# Patient Record
Sex: Female | Born: 1970 | Race: White | Hispanic: No | Marital: Married | Smoking: Former smoker
Health system: Southern US, Community
[De-identification: ages and names within clinical notes are randomized; demographics above are authoritative.]

## PROBLEM LIST (undated history)

## (undated) ENCOUNTER — Emergency Department (HOSPITAL_COMMUNITY): Disposition: A | Discharge status: Home / Self Care | Payer: Self-pay

## (undated) DIAGNOSIS — Z8719 Personal history of other diseases of the digestive system: Secondary | ICD-10-CM

## (undated) DIAGNOSIS — K219 Gastro-esophageal reflux disease without esophagitis: Secondary | ICD-10-CM

## (undated) DIAGNOSIS — G459 Transient cerebral ischemic attack, unspecified: Secondary | ICD-10-CM

## (undated) DIAGNOSIS — M1990 Unspecified osteoarthritis, unspecified site: Secondary | ICD-10-CM

## (undated) DIAGNOSIS — E119 Type 2 diabetes mellitus without complications: Secondary | ICD-10-CM

## (undated) DIAGNOSIS — R0600 Dyspnea, unspecified: Secondary | ICD-10-CM

## (undated) DIAGNOSIS — G8929 Other chronic pain: Secondary | ICD-10-CM

## (undated) DIAGNOSIS — S0300XA Dislocation of jaw, unspecified side, initial encounter: Secondary | ICD-10-CM

## (undated) DIAGNOSIS — R519 Headache, unspecified: Secondary | ICD-10-CM

## (undated) DIAGNOSIS — F959 Tic disorder, unspecified: Secondary | ICD-10-CM

## (undated) DIAGNOSIS — E079 Disorder of thyroid, unspecified: Secondary | ICD-10-CM

## (undated) DIAGNOSIS — E118 Type 2 diabetes mellitus with unspecified complications: Secondary | ICD-10-CM

## (undated) DIAGNOSIS — R238 Other skin changes: Secondary | ICD-10-CM

## (undated) DIAGNOSIS — G259 Extrapyramidal and movement disorder, unspecified: Secondary | ICD-10-CM

## (undated) DIAGNOSIS — T7840XA Allergy, unspecified, initial encounter: Secondary | ICD-10-CM

## (undated) DIAGNOSIS — K551 Chronic vascular disorders of intestine: Secondary | ICD-10-CM

## (undated) DIAGNOSIS — F419 Anxiety disorder, unspecified: Secondary | ICD-10-CM

## (undated) DIAGNOSIS — F329 Major depressive disorder, single episode, unspecified: Secondary | ICD-10-CM

## (undated) DIAGNOSIS — R52 Pain, unspecified: Secondary | ICD-10-CM

## (undated) DIAGNOSIS — F32A Depression, unspecified: Secondary | ICD-10-CM

## (undated) DIAGNOSIS — R059 Cough, unspecified: Secondary | ICD-10-CM

## (undated) DIAGNOSIS — R112 Nausea with vomiting, unspecified: Secondary | ICD-10-CM

## (undated) DIAGNOSIS — M7989 Other specified soft tissue disorders: Secondary | ICD-10-CM

## (undated) DIAGNOSIS — R0789 Other chest pain: Secondary | ICD-10-CM

## (undated) DIAGNOSIS — N289 Disorder of kidney and ureter, unspecified: Secondary | ICD-10-CM

## (undated) DIAGNOSIS — M797 Fibromyalgia: Secondary | ICD-10-CM

## (undated) DIAGNOSIS — R233 Spontaneous ecchymoses: Secondary | ICD-10-CM

## (undated) DIAGNOSIS — E7800 Pure hypercholesterolemia, unspecified: Secondary | ICD-10-CM

## (undated) DIAGNOSIS — R51 Headache: Secondary | ICD-10-CM

## (undated) DIAGNOSIS — E039 Hypothyroidism, unspecified: Secondary | ICD-10-CM

## (undated) DIAGNOSIS — I10 Essential (primary) hypertension: Secondary | ICD-10-CM

## (undated) DIAGNOSIS — Z9889 Other specified postprocedural states: Secondary | ICD-10-CM

## (undated) DIAGNOSIS — R05 Cough: Secondary | ICD-10-CM

## (undated) HISTORY — PX: ABDOMINAL HYSTERECTOMY: SHX81

## (undated) HISTORY — DX: Type 2 diabetes mellitus with unspecified complications: E11.8

## (undated) HISTORY — DX: Cough, unspecified: R05.9

## (undated) HISTORY — PX: OOPHORECTOMY: SHX86

## (undated) HISTORY — PX: BREAST CYST EXCISION: SHX579

## (undated) HISTORY — DX: Anxiety disorder, unspecified: F41.9

## (undated) HISTORY — DX: Other skin changes: R23.8

## (undated) HISTORY — DX: Gastro-esophageal reflux disease without esophagitis: K21.9

## (undated) HISTORY — DX: Other specified soft tissue disorders: M79.89

## (undated) HISTORY — DX: Essential (primary) hypertension: I10

## (undated) HISTORY — DX: Chronic vascular disorders of intestine: K55.1

## (undated) HISTORY — DX: Cough: R05

## (undated) HISTORY — PX: AORTA - SUPERIOR MESENTERIC AND AORTA - RENAL ARTERY BYPASS GRAFT: SUR175

## (undated) HISTORY — DX: Transient cerebral ischemic attack, unspecified: G45.9

## (undated) HISTORY — PX: CHOLECYSTECTOMY: SHX55

## (undated) HISTORY — DX: Disorder of thyroid, unspecified: E07.9

## (undated) HISTORY — PX: HERNIA REPAIR: SHX51

## (undated) HISTORY — DX: Spontaneous ecchymoses: R23.3

## (undated) HISTORY — DX: Tic disorder, unspecified: F95.9

## (undated) HISTORY — PX: BREAST EXCISIONAL BIOPSY: SUR124

## (undated) HISTORY — DX: Pure hypercholesterolemia, unspecified: E78.00

## (undated) HISTORY — DX: Disorder of kidney and ureter, unspecified: N28.9

## (undated) HISTORY — DX: Allergy, unspecified, initial encounter: T78.40XA

## (undated) HISTORY — PX: TOTAL ABDOMINAL HYSTERECTOMY: SHX209

## (undated) HISTORY — PX: OTHER SURGICAL HISTORY: SHX169

## (undated) HISTORY — PX: APPENDECTOMY: SHX54

---

## 1997-02-28 ENCOUNTER — Inpatient Hospital Stay (HOSPITAL_COMMUNITY): Admission: AD | Admit: 1997-02-28 | Discharge: 1997-02-28 | Payer: Self-pay | Admitting: Obstetrics and Gynecology

## 1997-06-21 ENCOUNTER — Inpatient Hospital Stay (HOSPITAL_COMMUNITY): Admission: AD | Admit: 1997-06-21 | Discharge: 1997-06-23 | Payer: Self-pay | Admitting: Obstetrics and Gynecology

## 1999-03-23 ENCOUNTER — Ambulatory Visit (HOSPITAL_COMMUNITY): Admission: RE | Admit: 1999-03-23 | Discharge: 1999-03-23 | Payer: Self-pay | Admitting: *Deleted

## 2001-02-05 ENCOUNTER — Other Ambulatory Visit: Admission: RE | Admit: 2001-02-05 | Discharge: 2001-02-05 | Payer: Self-pay | Admitting: Obstetrics and Gynecology

## 2001-11-11 ENCOUNTER — Observation Stay (HOSPITAL_COMMUNITY): Admission: RE | Admit: 2001-11-11 | Discharge: 2001-11-12 | Payer: Self-pay

## 2001-11-11 ENCOUNTER — Encounter (INDEPENDENT_AMBULATORY_CARE_PROVIDER_SITE_OTHER): Payer: Self-pay | Admitting: *Deleted

## 2002-02-05 ENCOUNTER — Other Ambulatory Visit: Admission: RE | Admit: 2002-02-05 | Discharge: 2002-02-05 | Payer: Self-pay | Admitting: Obstetrics and Gynecology

## 2002-02-17 ENCOUNTER — Other Ambulatory Visit: Admission: RE | Admit: 2002-02-17 | Discharge: 2002-02-17 | Payer: Self-pay | Admitting: Obstetrics and Gynecology

## 2002-09-15 ENCOUNTER — Other Ambulatory Visit: Admission: RE | Admit: 2002-09-15 | Discharge: 2002-09-15 | Payer: Self-pay | Admitting: Obstetrics and Gynecology

## 2002-10-22 ENCOUNTER — Ambulatory Visit (HOSPITAL_COMMUNITY): Admission: RE | Admit: 2002-10-22 | Discharge: 2002-10-22 | Payer: Self-pay | Admitting: *Deleted

## 2002-12-27 ENCOUNTER — Inpatient Hospital Stay (HOSPITAL_COMMUNITY): Admission: EM | Admit: 2002-12-27 | Discharge: 2002-12-28 | Payer: Self-pay | Admitting: Internal Medicine

## 2003-01-13 ENCOUNTER — Emergency Department (HOSPITAL_COMMUNITY): Admission: EM | Admit: 2003-01-13 | Discharge: 2003-01-13 | Payer: Self-pay | Admitting: Emergency Medicine

## 2003-09-16 ENCOUNTER — Other Ambulatory Visit: Admission: RE | Admit: 2003-09-16 | Discharge: 2003-09-16 | Payer: Self-pay | Admitting: Obstetrics and Gynecology

## 2003-11-09 ENCOUNTER — Ambulatory Visit (HOSPITAL_COMMUNITY): Admission: RE | Admit: 2003-11-09 | Discharge: 2003-11-09 | Payer: Self-pay | Admitting: Obstetrics and Gynecology

## 2004-03-02 ENCOUNTER — Other Ambulatory Visit: Admission: RE | Admit: 2004-03-02 | Discharge: 2004-03-02 | Payer: Self-pay | Admitting: Obstetrics and Gynecology

## 2004-06-21 ENCOUNTER — Other Ambulatory Visit: Admission: RE | Admit: 2004-06-21 | Discharge: 2004-06-21 | Payer: Self-pay | Admitting: Obstetrics and Gynecology

## 2004-08-17 ENCOUNTER — Ambulatory Visit (HOSPITAL_COMMUNITY): Admission: RE | Admit: 2004-08-17 | Discharge: 2004-08-17 | Payer: Self-pay | Admitting: *Deleted

## 2004-08-17 ENCOUNTER — Encounter (INDEPENDENT_AMBULATORY_CARE_PROVIDER_SITE_OTHER): Payer: Self-pay | Admitting: *Deleted

## 2004-08-29 ENCOUNTER — Encounter: Admission: RE | Admit: 2004-08-29 | Discharge: 2004-08-29 | Payer: Self-pay | Admitting: Obstetrics and Gynecology

## 2004-10-16 ENCOUNTER — Ambulatory Visit: Payer: Self-pay | Admitting: Internal Medicine

## 2004-10-30 ENCOUNTER — Ambulatory Visit (HOSPITAL_COMMUNITY): Admission: RE | Admit: 2004-10-30 | Discharge: 2004-10-30 | Payer: Self-pay | Admitting: Family Medicine

## 2004-10-30 ENCOUNTER — Emergency Department (HOSPITAL_COMMUNITY): Admission: EM | Admit: 2004-10-30 | Discharge: 2004-10-30 | Payer: Self-pay | Admitting: Emergency Medicine

## 2004-12-13 ENCOUNTER — Ambulatory Visit (HOSPITAL_COMMUNITY): Admission: RE | Admit: 2004-12-13 | Discharge: 2004-12-13 | Payer: Self-pay | Admitting: Neurosurgery

## 2005-01-10 ENCOUNTER — Ambulatory Visit (HOSPITAL_COMMUNITY): Admission: RE | Admit: 2005-01-10 | Discharge: 2005-01-10 | Payer: Self-pay | Admitting: Obstetrics and Gynecology

## 2005-03-06 ENCOUNTER — Encounter: Admission: RE | Admit: 2005-03-06 | Discharge: 2005-03-15 | Payer: Self-pay | Admitting: Neurosurgery

## 2005-03-29 ENCOUNTER — Ambulatory Visit (HOSPITAL_COMMUNITY): Admission: RE | Admit: 2005-03-29 | Discharge: 2005-03-29 | Payer: Self-pay | Admitting: Family Medicine

## 2005-05-13 ENCOUNTER — Ambulatory Visit: Payer: Self-pay | Admitting: Internal Medicine

## 2005-06-27 ENCOUNTER — Ambulatory Visit: Payer: Self-pay | Admitting: Internal Medicine

## 2005-08-29 ENCOUNTER — Ambulatory Visit (HOSPITAL_COMMUNITY): Admission: RE | Admit: 2005-08-29 | Discharge: 2005-08-30 | Payer: Self-pay | Admitting: Obstetrics and Gynecology

## 2005-08-29 ENCOUNTER — Encounter (INDEPENDENT_AMBULATORY_CARE_PROVIDER_SITE_OTHER): Payer: Self-pay | Admitting: Specialist

## 2005-12-25 ENCOUNTER — Ambulatory Visit (HOSPITAL_COMMUNITY): Admission: RE | Admit: 2005-12-25 | Discharge: 2005-12-25 | Payer: Self-pay | Admitting: Family Medicine

## 2005-12-27 ENCOUNTER — Ambulatory Visit (HOSPITAL_COMMUNITY): Admission: RE | Admit: 2005-12-27 | Discharge: 2005-12-27 | Payer: Self-pay | Admitting: Family Medicine

## 2006-01-22 ENCOUNTER — Inpatient Hospital Stay (HOSPITAL_COMMUNITY): Admission: EM | Admit: 2006-01-22 | Discharge: 2006-01-23 | Payer: Self-pay | Admitting: Emergency Medicine

## 2006-01-22 ENCOUNTER — Ambulatory Visit: Payer: Self-pay | Admitting: *Deleted

## 2006-02-12 ENCOUNTER — Ambulatory Visit: Payer: Self-pay | Admitting: Internal Medicine

## 2006-02-21 ENCOUNTER — Encounter: Admission: RE | Admit: 2006-02-21 | Discharge: 2006-02-21 | Payer: Self-pay | Admitting: Family Medicine

## 2006-06-17 ENCOUNTER — Encounter: Admission: RE | Admit: 2006-06-17 | Discharge: 2006-06-17 | Payer: Self-pay | Admitting: Obstetrics and Gynecology

## 2006-10-22 ENCOUNTER — Ambulatory Visit (HOSPITAL_COMMUNITY): Admission: RE | Admit: 2006-10-22 | Discharge: 2006-10-22 | Payer: Self-pay | Admitting: Cardiology

## 2006-12-20 ENCOUNTER — Ambulatory Visit (HOSPITAL_COMMUNITY): Admission: RE | Admit: 2006-12-20 | Discharge: 2006-12-20 | Payer: Self-pay | Admitting: Cardiology

## 2007-02-25 ENCOUNTER — Encounter: Admission: RE | Admit: 2007-02-25 | Discharge: 2007-02-25 | Payer: Self-pay | Admitting: Obstetrics and Gynecology

## 2007-06-15 ENCOUNTER — Emergency Department (HOSPITAL_COMMUNITY): Admission: EM | Admit: 2007-06-15 | Discharge: 2007-06-15 | Payer: Self-pay | Admitting: Emergency Medicine

## 2007-08-05 ENCOUNTER — Ambulatory Visit (HOSPITAL_COMMUNITY): Admission: RE | Admit: 2007-08-05 | Discharge: 2007-08-05 | Payer: Self-pay | Admitting: Internal Medicine

## 2007-09-11 ENCOUNTER — Ambulatory Visit (HOSPITAL_COMMUNITY): Admission: RE | Admit: 2007-09-11 | Discharge: 2007-09-11 | Payer: Self-pay | Admitting: *Deleted

## 2007-09-11 ENCOUNTER — Encounter (INDEPENDENT_AMBULATORY_CARE_PROVIDER_SITE_OTHER): Payer: Self-pay | Admitting: *Deleted

## 2007-12-10 IMAGING — US US EXTREM LOW VENOUS BILAT
1 series · 14 of 24 positions shown · non-contrast
Comparison: none

HISTORY: Bilateral leg swelling, left leg pain

[Series 1: unknown · 14 of 51 slices shown]
[im 1/51]
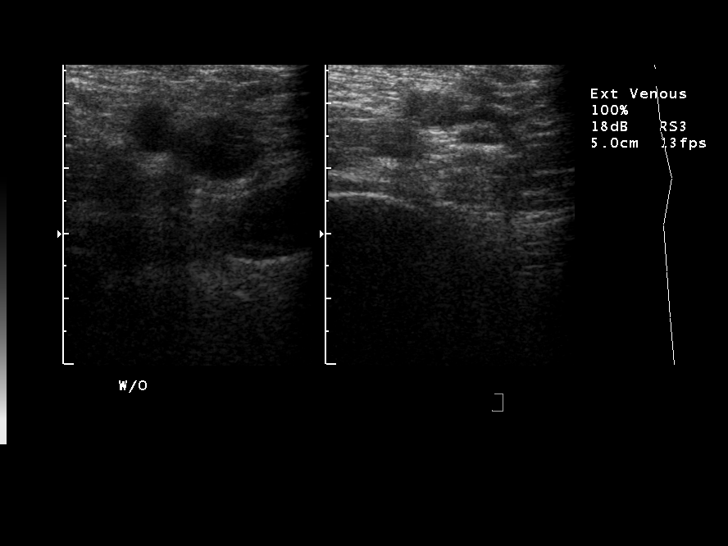
[im 5/51]
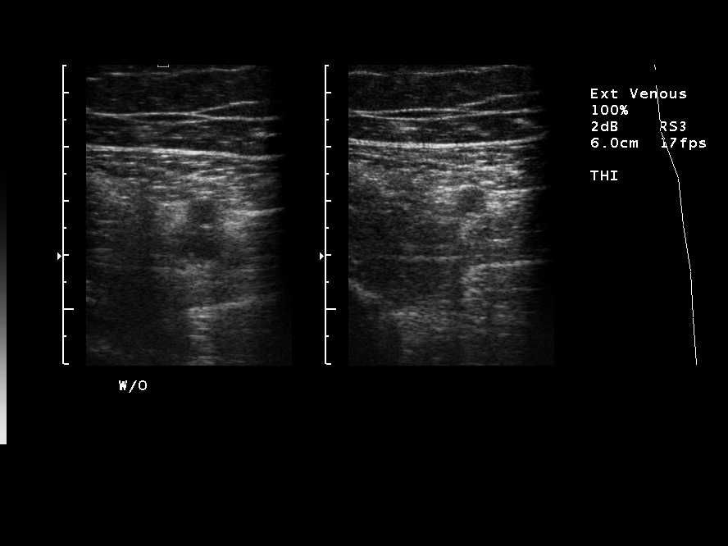
[im 9/51]
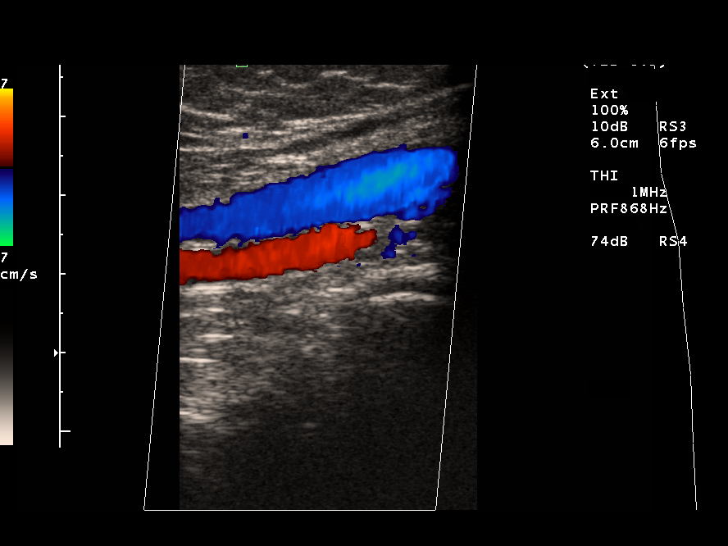
[im 14/51]
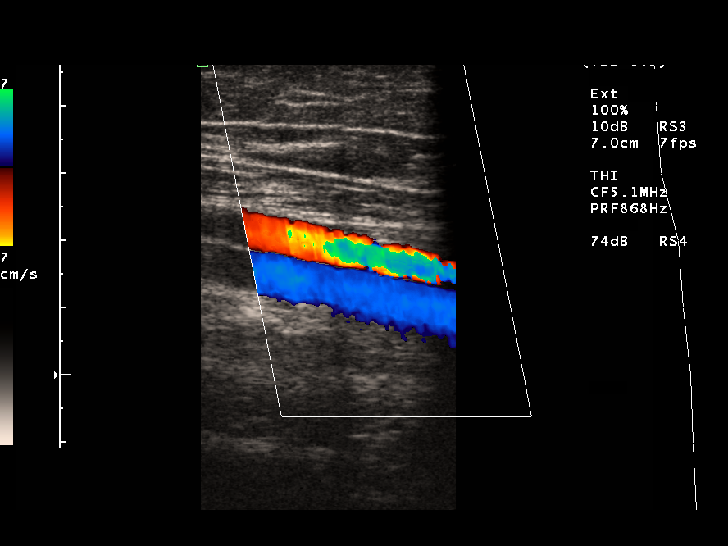
[im 16/51]
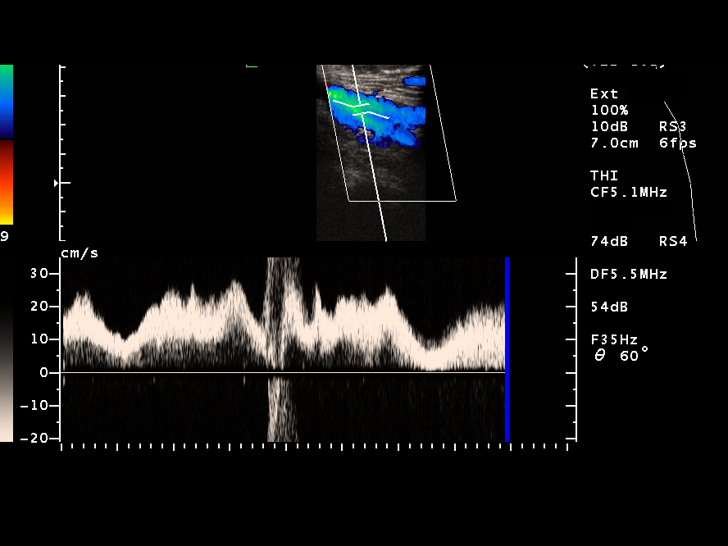
[im 20/51]
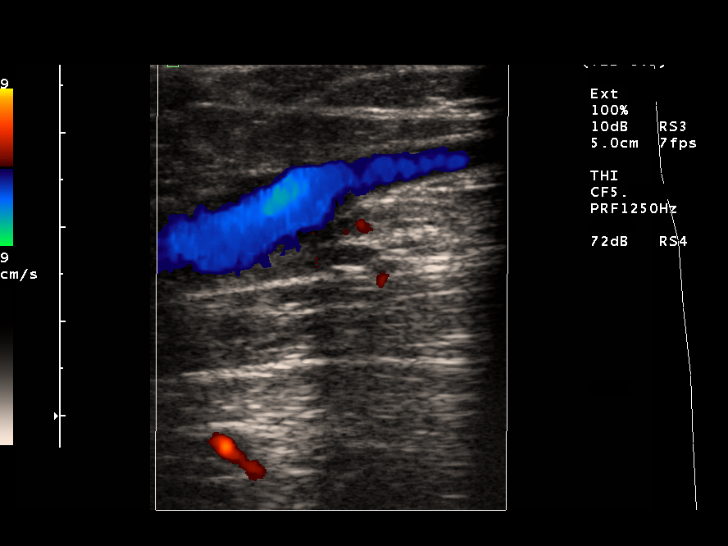
[im 24/51]
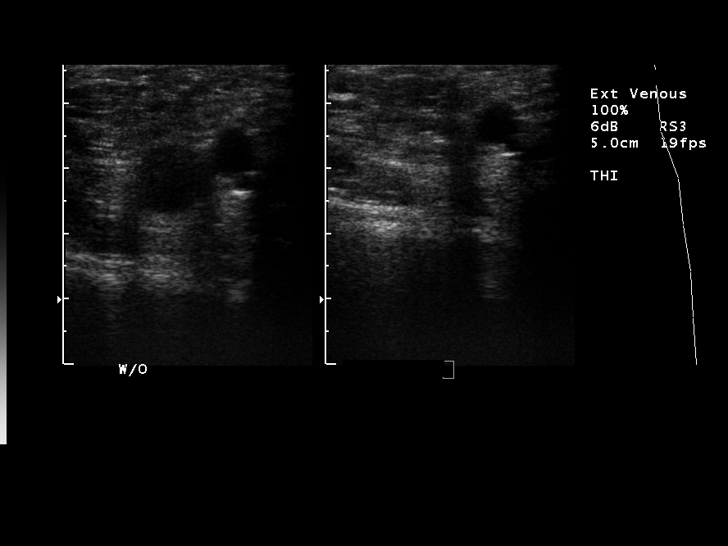
[im 27/51]
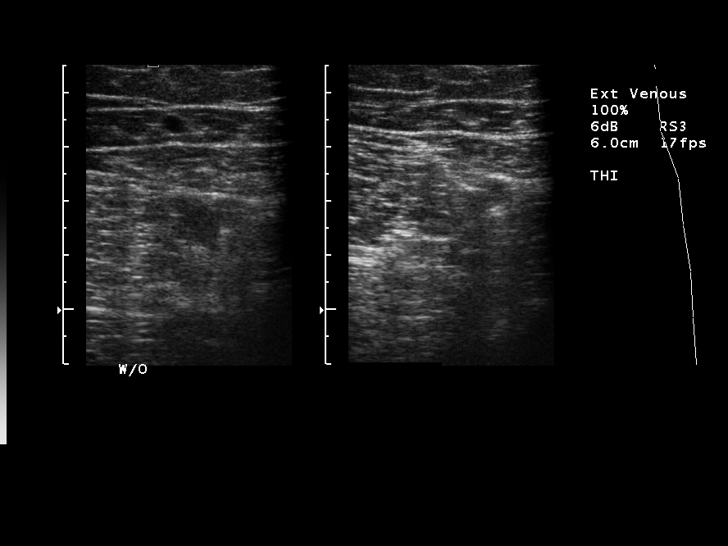
[im 31/51]
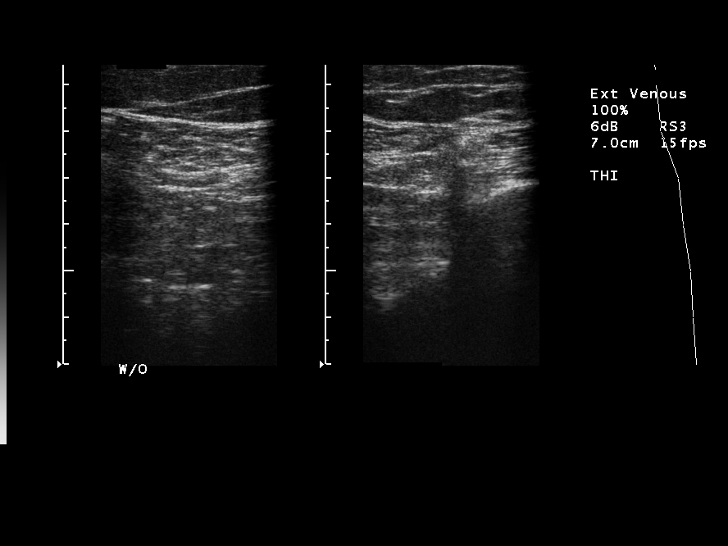
[im 35/51]
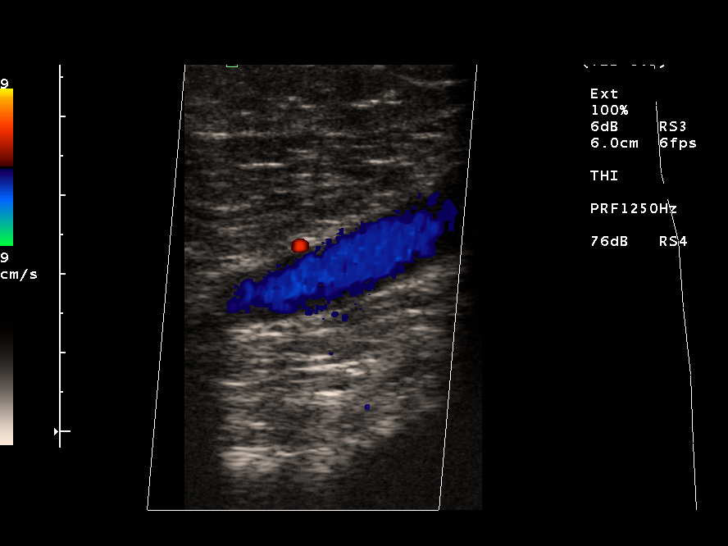
[im 40/51]
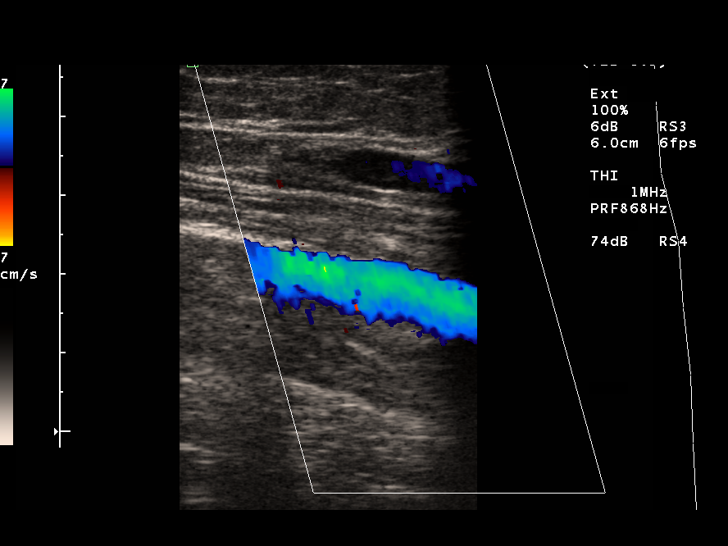
[im 42/51]
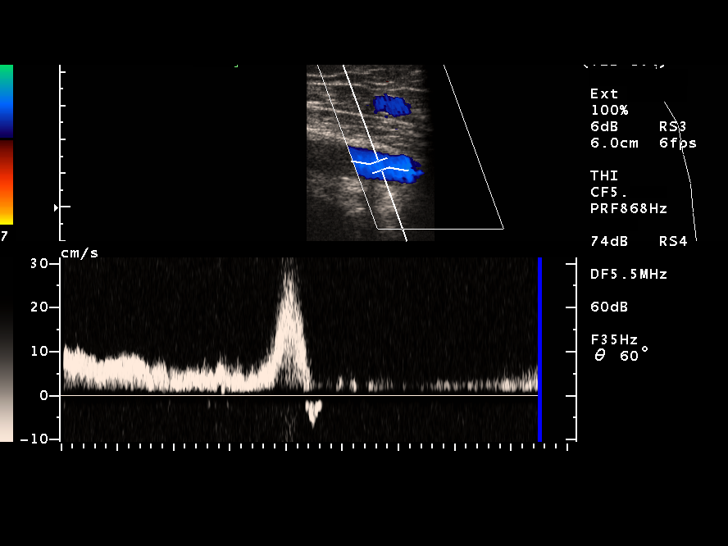
[im 46/51]
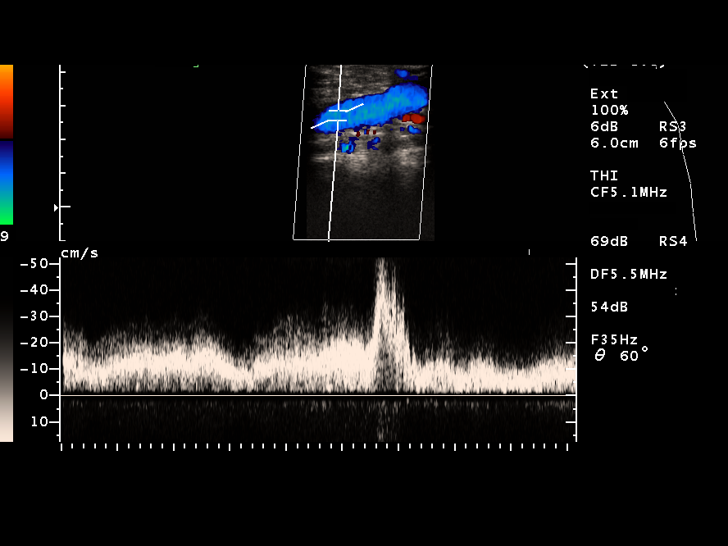
[im 51/51]
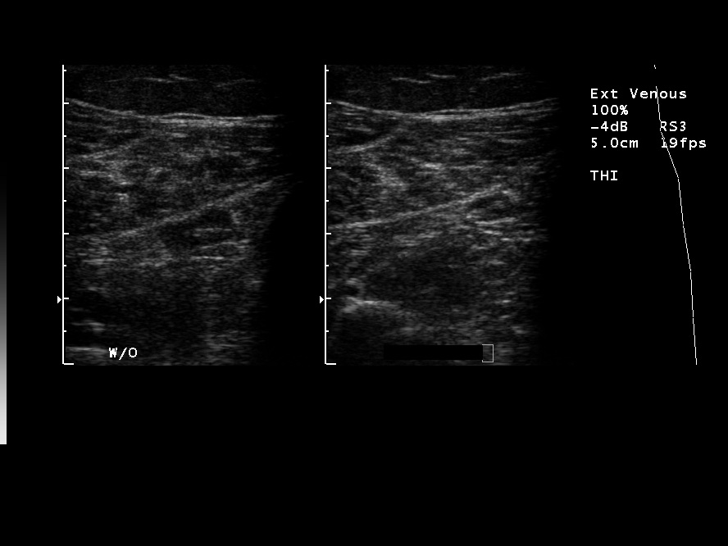

[14 of 24 positions shown; findings below may reference images not displayed]

ULTRASOUND VENOUS DUPLEX IMAGING BILATERAL LOWER EXTREMITY:

Grayscale imaging, color Doppler imaging, pulse wave analysis, and graded
compression of the deep venous systems performed.

Deep venous systems patent and compressible from groin through popliteal fossae
bilaterally.
Spontaneous venous flow present with intact augmentation and evidence of
respiratory phasicity. 
No intraluminal thrombus identified.
IMPRESSION: No evidence of deep venous thrombosis.

## 2008-01-18 ENCOUNTER — Encounter: Admission: RE | Admit: 2008-01-18 | Discharge: 2008-01-18 | Payer: Self-pay | Admitting: Neurosurgery

## 2008-03-15 ENCOUNTER — Emergency Department (HOSPITAL_COMMUNITY): Admission: EM | Admit: 2008-03-15 | Discharge: 2008-03-15 | Payer: Self-pay | Admitting: Emergency Medicine

## 2008-03-30 ENCOUNTER — Ambulatory Visit (HOSPITAL_COMMUNITY): Admission: RE | Admit: 2008-03-30 | Discharge: 2008-03-30 | Payer: Self-pay | Admitting: Obstetrics and Gynecology

## 2008-03-30 ENCOUNTER — Encounter (INDEPENDENT_AMBULATORY_CARE_PROVIDER_SITE_OTHER): Payer: Self-pay | Admitting: Obstetrics and Gynecology

## 2008-07-13 ENCOUNTER — Emergency Department (HOSPITAL_COMMUNITY): Admission: EM | Admit: 2008-07-13 | Discharge: 2008-07-13 | Payer: Self-pay | Admitting: Family Medicine

## 2008-10-04 ENCOUNTER — Encounter (HOSPITAL_COMMUNITY): Admission: RE | Admit: 2008-10-04 | Discharge: 2009-01-02 | Payer: Self-pay | Admitting: Endocrinology

## 2009-02-28 ENCOUNTER — Encounter (HOSPITAL_COMMUNITY): Admission: RE | Admit: 2009-02-28 | Discharge: 2009-03-30 | Payer: Self-pay | Admitting: Oncology

## 2009-02-28 ENCOUNTER — Ambulatory Visit (HOSPITAL_COMMUNITY): Payer: Self-pay | Admitting: Oncology

## 2009-03-20 ENCOUNTER — Ambulatory Visit (HOSPITAL_COMMUNITY): Admission: RE | Admit: 2009-03-20 | Discharge: 2009-03-20 | Payer: Self-pay | Admitting: Gastroenterology

## 2009-04-10 ENCOUNTER — Ambulatory Visit (HOSPITAL_COMMUNITY): Admission: RE | Admit: 2009-04-10 | Discharge: 2009-04-10 | Payer: Self-pay | Admitting: Gastroenterology

## 2009-05-01 ENCOUNTER — Ambulatory Visit (HOSPITAL_COMMUNITY): Admission: RE | Admit: 2009-05-01 | Discharge: 2009-05-01 | Payer: Self-pay | Admitting: Gastroenterology

## 2009-08-24 ENCOUNTER — Emergency Department (HOSPITAL_COMMUNITY): Admission: EM | Admit: 2009-08-24 | Discharge: 2009-08-24 | Payer: Self-pay | Admitting: Emergency Medicine

## 2009-08-29 ENCOUNTER — Emergency Department (HOSPITAL_COMMUNITY): Admission: EM | Admit: 2009-08-29 | Discharge: 2009-08-29 | Payer: Self-pay | Admitting: Emergency Medicine

## 2009-09-06 ENCOUNTER — Encounter: Admission: RE | Admit: 2009-09-06 | Discharge: 2009-10-26 | Payer: Self-pay | Admitting: Neurosurgery

## 2009-09-30 ENCOUNTER — Encounter: Admission: RE | Admit: 2009-09-30 | Discharge: 2009-09-30 | Payer: Self-pay | Admitting: Neurosurgery

## 2009-11-17 ENCOUNTER — Ambulatory Visit (HOSPITAL_COMMUNITY): Admission: RE | Admit: 2009-11-17 | Discharge: 2009-11-17 | Payer: Self-pay | Admitting: Neurosurgery

## 2010-02-17 ENCOUNTER — Encounter: Payer: Self-pay | Admitting: Obstetrics and Gynecology

## 2010-02-18 ENCOUNTER — Encounter: Payer: Self-pay | Admitting: Cardiology

## 2010-02-19 ENCOUNTER — Encounter: Payer: Self-pay | Admitting: Obstetrics and Gynecology

## 2010-04-18 LAB — DIFFERENTIAL
Eosinophils Relative: 2 % (ref 0–5)
Lymphocytes Relative: 35 % (ref 12–46)
Lymphs Abs: 2.2 10*3/uL (ref 0.7–4.0)
Monocytes Relative: 6 % (ref 3–12)
Neutrophils Relative %: 56 % (ref 43–77)

## 2010-04-18 LAB — PROTIME-INR
INR: 1.01 (ref 0.00–1.49)
Prothrombin Time: 13.2 seconds (ref 11.6–15.2)

## 2010-04-18 LAB — COMPREHENSIVE METABOLIC PANEL
AST: 21 U/L (ref 0–37)
CO2: 30 mEq/L (ref 19–32)
Calcium: 8.8 mg/dL (ref 8.4–10.5)
Creatinine, Ser: 1.25 mg/dL — ABNORMAL HIGH (ref 0.4–1.2)
GFR calc Af Amer: 59 mL/min — ABNORMAL LOW (ref >60)
GFR calc non Af Amer: 48 mL/min — ABNORMAL LOW (ref >60)
Sodium: 138 mEq/L (ref 135–145)
Total Protein: 6.7 g/dL (ref 6.0–8.3)

## 2010-04-18 LAB — CBC
MCHC: 34.4 g/dL (ref 30.0–36.0)
MCV: 96.3 fL (ref 78.0–100.0)
Platelets: 266 10*3/uL (ref 150–400)
RBC: 4.16 MIL/uL (ref 3.87–5.11)
RDW: 13.6 % (ref 11.5–15.5)

## 2010-05-03 LAB — HCG, SERUM, QUALITATIVE: Preg, Serum: NEGATIVE

## 2010-05-10 LAB — CBC
Platelets: 221 10*3/uL (ref 150–400)
RBC: 3.91 MIL/uL (ref 3.87–5.11)
WBC: 5.8 10*3/uL (ref 4.0–10.5)

## 2010-05-15 LAB — COMPREHENSIVE METABOLIC PANEL
ALT: 28 U/L (ref 0–35)
Alkaline Phosphatase: 61 U/L (ref 39–117)
CO2: 27 mEq/L (ref 19–32)
Chloride: 105 mEq/L (ref 96–112)
GFR calc non Af Amer: >60 mL/min (ref >60)
Glucose, Bld: 82 mg/dL (ref 70–99)
Potassium: 3 mEq/L — ABNORMAL LOW (ref 3.5–5.1)
Sodium: 140 mEq/L (ref 135–145)
Total Protein: 5.4 g/dL — ABNORMAL LOW (ref 6.0–8.3)

## 2010-05-15 LAB — URINALYSIS, ROUTINE W REFLEX MICROSCOPIC
Nitrite: NEGATIVE
Specific Gravity, Urine: 1.009 (ref 1.005–1.030)
pH: 6 (ref 5.0–8.0)

## 2010-05-15 LAB — CBC
Hemoglobin: 12.7 g/dL (ref 12.0–15.0)
RBC: 3.93 MIL/uL (ref 3.87–5.11)
WBC: 8.8 10*3/uL (ref 4.0–10.5)

## 2010-05-15 LAB — DIFFERENTIAL
Basophils Relative: 1 % (ref 0–1)
Eosinophils Absolute: 0.2 10*3/uL (ref 0.0–0.7)
Monocytes Relative: 7 % (ref 3–12)
Neutrophils Relative %: 69 % (ref 43–77)

## 2010-05-15 LAB — PROTIME-INR: Prothrombin Time: 13.9 seconds (ref 11.6–15.2)

## 2010-06-12 NOTE — Op Note (Signed)
Sabrina Roberson, Sabrina Roberson                 ACCOUNT NO.:  [id number]   MEDICAL RECORD NO.:  [id number]          PATIENT TYPE:  AMB   LOCATION:  SDC                           FACILITY:  [facility]   PHYSICIAN:  Michelle L. Grewal, M.D.DATE OF BIRTH:  02/10/1970   DATE OF PROCEDURE:  03/30/2008  DATE OF DISCHARGE:                               OPERATIVE REPORT   PREOPERATIVE DIAGNOSES:  Pelvic pain and bilateral ovarian cysts.   POSTOPERATIVE DIAGNOSES:  Pelvic pain and bilateral ovarian cysts.   PROCEDURE:  Laparoscopic bilateral salpingo-oophorectomy.   SURGEON:  Michelle L. Vincente Poli, MD   ANESTHESIA:  General.   FINDINGS:  Bilateral ovaries with cysts, left one appeared necrotic.   SPECIMENS:  Bilateral tubes and ovaries sent for routine pathology.   ESTIMATED BLOOD LOSS:  Minimal.   PROCEDURE:  The patient was taken to the operating room.  She was  intubated without any difficulty.  She was prepped and draped.  A Foley  catheter was inserted.  A sponge stick was placed in the vagina.  Attention was turned to the abdomen, a small infraumbilical incision was  made with a scalpel.  The Veress needle was inserted and  pneumoperitoneum was performed.  An 11-mm trocar was inserted and the  laparoscope was introduced through the trocar sheath.  The patient was  gently placed in Trendelenburg position and a secondary trocar was  placed suprapubically under direct visualization.  The  pelvis was  inspected, she was of course status post hysterectomy.  The ovaries were  bilaterally had ovarian cysts and they were kind of hanging from their  infundibulopelvic pedicles, so I could see how both could easily twist.  The left ovary was not as enlarged as we had seen from the ultrasound in  the office, however, it appeared dark and it appeared almost partially  necrotic.  My thoughts are that the patient may have been experiencing  intermittent torsion and that this might have explained her intermittent  severe pain in the left flank region.  There were no adhesions.  I  identified the ureter well below in the pelvic sidewall, and I used an  EnSeal instrument and placed it across the right IP ligament and then  released the ovary and tube.  I could see ureteral peristalsis after  this.  This was done in identical fashion on left side and again we saw  ureteral peristalsis.  The suprapubic incision was changed to a 10-mm  trocar and I placed the Endobag and I was able to place both specimens  within one Endobag.  I then removed the Endobag and the specimens.  The  pneumoperitoneum was released.  A little bit of pressure was decreased,  I saw no bleeding from either pedicle site.  The pneumoperitoneum  peritoneum was further released.  The trocar was removed.  Each of  incision was closed with a fascial stitch using 0 Vicryl and then  Dermabond skin adhesive was placed at each skin site.  All sponge, lap,  and instrument counts were correct x2.  The Foley was removed.  The  patient was extubated and went to recovery room in stable condition.      Michelle L. Vincente Poli, M.D.  Electronically Signed     MLG/MEDQ  D:  03/30/2008  T:  03/30/2008  Job:  [id number]

## 2010-06-12 NOTE — Op Note (Signed)
Sabrina Roberson, Sabrina Roberson                 ACCOUNT NO.:  [id number]   MEDICAL RECORD NO.:  [id number]          PATIENT TYPE:  AMB   LOCATION:  ENDO                         FACILITY:  [facility]   PHYSICIAN:  Georgiana Spinner, M.D.    DATE OF BIRTH:  1970-03-17   DATE OF PROCEDURE:  09/11/2007  DATE OF DISCHARGE:                               OPERATIVE REPORT   PROCEDURE:  Upper endoscopy with biopsy.   INDICATIONS:  Abdominal pain, rule out ulcer disease.   ANESTHESIA:  Demerol 100 mg, Versed 10 mg.   PROCEDURE IN DETAIL:  With the patient mildly sedated in the left  lateral decubitus position the Pentax videoscopic endoscope was inserted  in the mouth and passed under direct vision through the esophagus which  appeared normal into the stomach, fundus, body appeared normal.  Antrum,  however, was quite erythematous and a number of punctate ulcers were  seen, photographed and biopsied.  We entered into the duodenal bulb,  second portion and duodenum both appeared normal.  From this point the  endoscope was slowly withdrawn taking circumferential views of duodenal  mucosa until the endoscope had been pulled back into the stomach placed  in retroflexion to view the stomach from below.  The endoscope was  straightened and withdrawn taking circumferential views of remaining  gastric and esophageal mucosa.  The patient's vital signs, pulse  oximeter remained stable.  The patient tolerated the procedure well  without apparent complications.   FINDINGS:  Erythema with ulcerations of the antrum of the stomach.  Await biopsy report.  The patient will be placed on b.i.d. proton pump  inhibitor awaiting biopsy report and the patient will call me for  results of biopsy and follow up with me as needed as an outpatient.           ______________________________  Georgiana Spinner, M.D.     GMO/MEDQ  D:  09/11/2007  T:  09/11/2007  Job:  [id number]   cc:   Dr Lindell Noe, M.D.  Fax:  [phone]

## 2010-06-15 NOTE — Op Note (Signed)
Sabrina Roberson, Sabrina Roberson                 ACCOUNT NO.:  [id number]   MEDICAL RECORD NO.:  [id number]          PATIENT TYPE:  AMB   LOCATION:  ENDO                         [facility]ACILITY:  [facility]   PHYSICIAN:  Georgiana Spinner, M.D.    DATE OF BIRTH:  [birth year]   DATE OF PROCEDURE:  08/17/2004  DATE OF DISCHARGE:                                 OPERATIVE REPORT   PROCEDURE:  Upper endoscopy.   INDICATIONS:  Abdominal pain with hemoccult positivity.   ANESTHESIA:  Fentanyl 75 mcg, Versed 6 mg, Phenergan 12.5 mg.   DESCRIPTION OF PROCEDURE:  With the patient mildly sedated in the left  lateral decubitus position, the Olympus videoscopic endoscope was inserted  in the mouth and passed under direct vision through the esophagus which  appeared normal. There was no evidence of Barrett's seen. We entered into  the stomach, fundus, body, antrum, duodenal bulb, second portion of duodenum  were visualized. From this point, the endoscope was slowly withdrawn taking  circumferential views of the duodenal mucosa until the endoscope had been  pulled back into the stomach, placed in retroflexion to view the stomach  from below. The endoscope was then straightened and withdrawn taking  circumferential views of the remaining gastric and esophageal mucosa. The  patient's vital signs and pulse oximeter remained stable. The patient  tolerated the procedure well without apparent complications.   FINDINGS:  Rather unremarkable examination.   PLAN:  Proceed to colonoscopy.       GMO/MEDQ  D:  08/17/2004  T:  08/17/2004  Job:  [id number]   cc:   Delaney Meigs, M.D.  [address]  [place]  [place] [address]  Fax: [phone]

## 2010-06-15 NOTE — Assessment & Plan Note (Signed)
[facility]                                 ON-CALL NOTE   Sabrina Roberson, Sabrina Roberson                          MRN:          [id number]  DATE:01/22/2006                            DOB:          12/05/1970    TELEPHONE CONVERSATION   I received a telephone call through the answering service on January 22, 2006, at 2022 p.m. stating Sabrina Roberson, a patient of Dr. Lubertha Basque,  complained of shortness of breath and slurred speech. I returned the  telephone call to [phone]. I spoke with [name]. She states she  has a history of irregular heart rhythm. She was unable to tell me at  that time what it was. She states that she felt short of breath and that  she felt like she was experiencing some slurred speech. I instructed her  to hang up the phone and call 911 to be evaluated as this was a new  finding for her.   The patient stated she would hang up the phone and call 911, and be  transported to [facility].      Dorian Pod, ACNP  Electronically Signed      Luis Abed, MD, [facility]  Electronically Signed   MB/MedQ  DD: 01/22/2006  DT: 01/22/2006  Job #: [id number]

## 2010-06-15 NOTE — Op Note (Signed)
Sabrina Roberson, Sabrina Roberson                             ACCOUNT NO.:  [id number]   MEDICAL RECORD NO.:  [id number]                   PATIENT TYPE:  AMB   LOCATION:  DAY                                  FACILITY:  [facility]   PHYSICIAN:  Lorre Munroe., MD              DATE OF BIRTH:  Oct 11, 1970   DATE OF PROCEDURE:  11/11/2001  DATE OF DISCHARGE:                                 OPERATIVE REPORT   PREOPERATIVE DIAGNOSIS:  Biliary dyskinesia with abdominal pain.   POSTOPERATIVE DIAGNOSIS:  Biliary dyskinesia with abdominal pain.   OPERATION:  Laparoscopic cholecystectomy and cholangiogram.   SURGEON:  Zigmund Daniel, M.D.   ASSISTANT:  Ollen Gross. Carolynne Edouard, M.D.   ANESTHESIA:  General.   DESCRIPTION OF PROCEDURE:  After the patient was monitored and anesthetized  and had routine preparation and draping of the abdomen, I infused a local  anesthetic just below the umbilicus and then made a short transverse  incision, dissected down to the fascia and opened it in the midline and then  opened the peritoneum bluntly.  After being sure that I was within the  abdominal cavity and that there were no adhesions in the region, I placed a  0 Vicryl pursestring suture in the fascia and secured a Hasson cannula.  I  inflated the abdomen with CO2 and examined abdominal contents, saw no  abnormalities at all.  I then anesthetized three additional port sites and  placed them under direct vision, then working through epigastric port, and  with the gallbladder fundus retracted toward the right shoulder and the  infundibulum retracted laterally, I identified the cystic duct and the  cystic artery.  I put a clip on the cystic duct as it emerged from the  infundibulum of the gallbladder, made a small hole in it and inserted the  Reddick cholangiogram catheter secured by one clip.  I performed a  fluoroscopic cholangiogram which showed small, normal-appearing biliary  ducts with no evidence of obstruction or  filling defects.  I then  repositioned the patient, obtained pneumoperitoneum again, and removed the  clip holding the catheter and removed the cholangiogram catheter.  I clipped  the distal cystic duct with three clips and then divided it.  I divided it  just below the clip on the proximal cystic duct.  I then similarly clipped  and divided the cystic artery then dissected the gallbladder from the liver  utilizing the hook cautery device and gained hemostasis with the cautery.  Hemostasis was not a problem.  I accidentally made one small hole in the  gallbladder near the fundus, and a small amount of bile spilled.  After  detaching the gallbladder, I placed in a plastic pouch and put it above the  liver.  I then thoroughly irrigated the gallbladder fossa and right upper  quadrant and removed the irrigant, and we checked again for hemostasis and  found it to be good.  I then removed the gallbladder from the body through  the umbilical incision and tied the pursestring suture.  I then allowed CO2  to escape after removing the lateral ports under direct vision.  I removed  the epigastric port and closed all incisions with intracuticular 4-0 Vicryl  and Steri-Strips.  The patient tolerated the operation well.                                               Lorre Munroe., MD    WB/MEDQ  D:  11/11/2001  T:  11/11/2001  Job:  [id number]

## 2010-06-15 NOTE — Assessment & Plan Note (Signed)
[facility]                         ELECTROPHYSIOLOGY OFFICE NOTE   GLENDENE, WYER                        MRN:          [id number]  DATE:02/12/2006                            DOB:          06/02/1970    Sabrina Roberson returns today for followup.  She is a very pleasant, young  woman with a history of palpitations and SVT, which we have controlled  with medications and just a period of watchful waiting as her symptoms  have not been particularly bad.  The patient also has a history of  headaches and her big complaint today is that of peripheral edema.  She  does admit to some sodium indiscretion and states that she constantly  tries to drink water to keep her system flushed out.  She states that  since her headaches have gotten better on medications (Depakote), her  palpitations and heart racing have improved dramatically.   EXAM:  She is a pleasant, well-appearing, young woman in no distress.  Her blood pressure was 114/66.  The pulse was 83 and regular.  The  respirations were 18 and the weight was 151 pounds.  NECK:  No jugular venous distention.  LUNGS:  Clear bilaterally to auscultation.  There were no wheezes,  rales, or rhonchi.  CARDIOVASCULAR:  Regular rate and rhythm with a normal S1 and S2.  EXTREMITIES:  No edema.  She did have non-pitting peripheral edema in  the lower extremities bilaterally.   The EKG demonstrates a sinus rhythm, normal axis and intervals.   IMPRESSION:  1. Palpitations and supraventricular tachycardia now well controlled.  2. History of migraine headaches, now also well controlled.  3. Peripheral edema which I suspect is multifactorial.   DISCUSSION:  I have discussed the importance of maintaining a low-salt  diet and to decrease her fluid intake.  I have also encouraged her to  keep her legs elevated when at all possible.  Support stockings would  also be an option for her.     Doylene Canning. Ladona Ridgel, MD  Electronically Signed    GWT/MedQ  DD: 02/12/2006  DT: 02/12/2006  Job #: [id number]   cc:   Macarthur Critchley. Shelva Majestic, M.D.  Delaney Meigs, M.D.

## 2010-06-15 NOTE — Op Note (Signed)
NAME:  Sabrina Roberson, Sabrina Roberson                           ACCOUNT NO.:  [id number]   MEDICAL RECORD NO.:  [id number]                   PATIENT TYPE:  INP   LOCATION:  A310                                 FACILITY:  [facility]   PHYSICIAN:  Jerolyn Shin C. Katrinka Blazing, M.D.                DATE OF BIRTH:  [birth year]   DATE OF PROCEDURE:  DATE OF DISCHARGE:                                 OPERATIVE REPORT   PREOPERATIVE DIAGNOSIS:  Acute appendicitis.   POSTOPERATIVE DIAGNOSIS:  Acute appendicitis.   PROCEDURE:  Laparoscopic appendectomy.   SURGEON:  Dirk Dress. Katrinka Blazing, M.D.   DESCRIPTION OF PROCEDURE:  Under general anesthesia the patient's abdomen  was prepped and draped in a sterile field.  A supraumbilical midline  incision was made and a Veress needle was inserted uneventfully.  The  abdomen was insufflated with 2 liters of CO2.  Using a Vis-A-Port guide a 10-  mm port was placed uneventfully.  A laparoscope was placed and the appendix  was visualized.   Under videoscopic guidance a 10-mm port was placed in the suprapubic midline  and a 12-mm port was placed in the left lower quadrant.  The base of the  appendix including the mesoappendix was very narrow.  It was transected  using an Endo-GIA vascular stapler.  There was no major fluid. There was no  pelvic fluid, and no fluid in the right gutter.  There was no bleeding from  the stump.  Irrigation was carried out and the fluid was totally clear.   An inspection of the upper abdomen was unremarkable.  CO2 was allowed to  escape from the abdomen and the ports were removed. The incisions were  closed using #0 Dexon on the fascia and staples on the skin.  The physical  therapy tolerated the procedure well.  Dressings were placed. She was  awakened from anesthesia uneventfully, transferred to a bed, and taken to  the postanesthetic care unit.      ___________________________________________                                            Dirk Dress. Katrinka Blazing, M.D.   LCS/MEDQ  D:  12/27/2002  T:  12/27/2002  Job:  [id number]

## 2010-06-15 NOTE — Discharge Summary (Signed)
NAMEBARBI, Sabrina Roberson                 ACCOUNT NO.:  [id number]   MEDICAL RECORD NO.:  [id number]           PATIENT TYPE:   LOCATION:                                 FACILITY:   PHYSICIAN:  Maple Mirza, PA   DATE OF BIRTH:  August 08, 1970   DATE OF ADMISSION:  01/22/2006  DATE OF DISCHARGE:  01/23/2006                               DISCHARGE SUMMARY   PRIMARY CAREGIVER:  Dr. Harland Dingwall.   ALLERGIES:  The patient has no known drug allergies.   PRINCIPAL DIAGNOSES:  1. Admitted with palpitations.  2. Atypical chest pain.  3. Blurred vision x48 hours.  4. CT of the head December 27 shows no acute intracranial      abnormalities.  5. Cardiac enzymes were cycled x3. Troponin I studies are less than      0.05, then 0.01, then 0.01.  6. Electrocardiogram shows sinus rhythm with a rate of 86 beats per      minute. The PR interval was 158 milliseconds, QT is 368      milliseconds, QRS duration is 82 milliseconds.  7. Possible __________ syndrome to explain blurred vision, dull      headache.  8. Symptoms have resolved by 16 hours into hospitalization here at      [facility].   SECONDARY DIAGNOSES:  1. Asthma.  2. Migraines.  3. Hypertension.  4. History of supraventricular tachycardias thought to be perhaps      possibly atrial tachyarrhythmia.  In the past she has seen Dr.      Ladona Ridgel, and the counsel is that if she has severe palpitations she      should get an electrocardiogram. 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      Certainly if these episodes of supraventricular      tachycardia/palpitations increase, she will be a candidate for      electrophysiology study. Apparently she has not had this problem      since her consultation with Dr. Ladona Ridgel in April 2007. The patient      has also had an extensive evaluation outpatient at [facility] and      has been started on Depakote for migraine symptoms.   PROCEDURES THIS ADMISSION:  Basic laboratory studies, cycling cardiac  enzymes which were negative x3. A CT of the head which was negative for  intracranial abnormality. Chest x-ray which showed no cardiopulmonary  disease. Electrocardiogram which showed sinus rhythm with a rate of 86.   DISCHARGE MEDICATIONS:  1. The patient was discharged on her medications which include Imitrex      as needed.  2. Aggrenox one tab daily.  3. Singulair 10 mg daily.  4. Zyrtec 10 mg daily.  5. Depakote for migraines two tablets daily at bedtime.   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  Elyn Aquas office for an appointment in the near future.   LABORATORY STUDIES:  Total cholesterol 154, triglycerides 46, HDL  cholesterol 41, LDL cholesterol 104. Serum  magnesium this admission was  2.2. TSH this admission 0.765. Complete metabolic panel showing sodium  141, potassium 3.7, chloride 111, carbonate 23, glucose 97, BUN 16,  creatinine 0.9. Alkaline phosphatase 73. SGOT 55, SGPT 91. Protime on  admission is 14.2. INR 1.1, PTT 32.      Maple Mirza, PA     GM/MEDQ  D:  01/23/2006  T:  01/23/2006  Job:  [id number]   cc:   Doylene Canning. Ladona Ridgel, MD  Macarthur Critchley. Shelva Majestic, M.D.

## 2010-06-15 NOTE — H&P (Signed)
NAMESABREENA, STURDEVANT                 ACCOUNT NO.:  [id number]   MEDICAL RECORD NO.:  [id number]          PATIENT TYPE:  INP   LOCATION:  6529                         FACILITY:  [facility]   PHYSICIAN:  Bimal R. Sherryll Burger, MD      DATE OF BIRTH:  02/01/1970   DATE OF ADMISSION:  01/22/2006  DATE OF DISCHARGE:  01/23/2006                              HISTORY & PHYSICAL   PRIMARY CARDIOLOGIST:  Dr. Ladona Ridgel   CHIEF COMPLAINT:  Palpitations and possible slurred speech.   HISTORY OF PRESENT ILLNESS:  This is a 40 year old woman with a history  of hypertension and an SVT followed by Dr. Ladona Ridgel, who has had recent  escalation of neurological problems, including blurred vision, chronic  headaches, and some issues with slurred speech and confusion over the  past 4 months.  Back in November she was seen by Dr. Izola Price at [facility] in neurologic consultation and underwent an MRI, MRA,  a CT scan of the head, carotid Doppler, ultrasounds, and a cardiac echo  in order to work up her neurologic symptoms.  All these were,  unfortunately, unremarkable and did not define the etiology of her  symptoms.  Additionally, at that time Dr. Izola Price started the patient on  Aggrenox 1 tablet just once a day.  He said that after he saw her again  he would consider increasing her dose to twice daily.  Additionally, the  patient suffers from migraine headaches and gets relief with Imitrex.  She states that the frequency of these has not been increasing, however,  she does have more constant headaches, almost on a daily  basis.   She states that today she has had palpitations all day.  Additionally,  she has had some blurring of her vision for the past 2 days and then  around 9 o'clock this evening she had the onset of chest pressure  lasting only about 5 minutes, but was associated with shortness of  breath, palpitations, but no racing heart rate.  She did endorse nausea  but no emesis.  She did feel slightly  lightheaded but no dizziness.  She  states that she did not have any blurred vision or confusion and she  rates the pain a 7/10 with no radiation.  She called the [facility] PA on  call, who thought the patient was having slurred speech and so told the  patient to come to the emergency department.  In the emergency  department the patient did not have any slurred speech and unfortunately  she was not family for a good portion of the day so they could not  corroborate whether she was indeed having slurred speech.  She denies  any confusion during the course of the day.  Her primary care physician,  who is also her boss, was in the emergency room with her to provide the  bulk of her history.  Additionally reviewing her chart notes from  [place], she has a history of a narrow complex tachycardia that is  thought to be either an atrial tachycardia or an inappropriate sinus  tachycardia.  In the past she has been tried on digoxin with adequate  control of her palpitations, however, this medicine had been  discontinued a while back.  Also, the patient has a history of labile  hypertension and had been on numerous antihypertensives in the past with  extreme lability in her blood pressure.  However, after stopping birth  control pills, her hypertension issues resolved and she is currently not  maintained on any antihypertensives with good control of her blood  pressure.   REVIEW OF SYSTEMS:  She does note some daily headaches.  She did have a  migraine yesterday that was relieved with Imitrex.  As noted above, she  has had blurred vision for the past 2 days.  She has constant lower  extremity edema that is much improved than it had been in the past.  She  also describes bilateral aches in her jaw.  The remainder of review of  systems is as in the HPI.   PAST MEDICAL HISTORY:  1. Asthma.  2. Migraines as described above.  3. History of labile hypertension while on birth control pills,       currently not taking birth control pills with well-controlled blood      pressure, without any medical intervention.  4. 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  Had been      controlled on digoxin in the past, currently not taking.  5. History of cholecystectomy.  6. History of appendectomy.  7. History of laparoscopic hysterectomy approximately 2 months with      sparing of her ovaries.   CURRENT ALLERGIES:  None.   CURRENT MEDICATIONS:  1. Imitrex p.r.n.  2. Aggrenox 1 tablet daily.  3. Zyrtec p.r.n.  4. Singulair 10 mg daily.   SOCIAL HISTORY:  She lives in [place] with her husband and 2 children.  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  She  smokes 1 pack of cigarettes a day and has been smoking for 10 years.  She denies any alcohol or drugs.   FAMILY HISTORY:  Her father and mother are in good health.  Her father  has prostate cancer.  She has no siblings.   PHYSICAL EXAMINATION:  VITAL SIGNS:  Blood pressure is 124/81, pulse is  77, respiratory rate of 18, temperature of 98.5, O2 saturation is 100%  on room air.  GENERAL:  She is alert and oriented x3, in no acute distress.  HEENT:  Normocephalic, atraumatic.  Pupils are equally round and react  to light.  NECK:  Supple with no lymphadenopathy.  Normal thyroid.  LUNGS:  Clear to auscultation bilaterally without wheezes, rhonchi, or  rales.  CARDIOVASCULAR:  Her JVP is flat.  She has no carotid bruits.  She has  2+ carotid pulses, symmetrical bilaterally.  CARDIAC:  Regular rate and rhythm.  Normal S1, S2.  No murmurs, rubs, or  gallops.  ABDOMEN:  Positive bowel sounds.  Soft, nontender, nondistended.  EXTREMITIES:  Radial and posterior tibialis pulses 2+, symmetric  bilaterally, without any lower extremity clubbing or cyanosis. NEUROLOGIC:  Alert and oriented x3.  Cranial nerves III-XII are intact.  She has  5/5 motor strength throughout.   LABORATORY DATA:  A chest x-ray and laboratory are currently pending.  An EKG shows a sinus rhythm at a rate of 86 with a normal axes and  normal intervals without any Q  waves or ST changes.   ASSESSMENT:  1. Palpitations with a history of a narrow complex tachycardia,      thought to be inappropriate sinus tachycardia versus an atrial      tachyarrhythmia.  2. Neurologic disturbances that involved a full neurologic workup at      [facility] under Dr. Izola Price that was unremarkable.  She continues      to have neurologic symptoms as demonstrated by her presentation      here on admission with blurred vision for approximately 2 days and      slurred speech earlier today that has now resolved.  3. History of migraines.   PLAN:  It is unclear what the etiology of the patient's neurologic  symptoms are.  Certainly a cardiac phenomenon should be completely ruled  out versus a primary neurologic etiology.  She certainly has had a  comprehensive workup up to this point.  The one thing I would recommend  is probably further assessment for a possible PFO, certainly individuals  with migraines who have these transient neurologic symptoms have been  found to have PFO.  It is unclear if its a causal relationship, however,  this might warrant further investigation to see if she is having  paradoxical emboli that are causing her transient neurologic symptoms.  At this point in time I will continue her Aggrenox.  We will get a head  CT just given her symptoms of slurred speech earlier today.  If further  cardiac workup is unrevealing, it may be reasonable to have another  neurologic evaluation while she is here at [facility].           ______________________________  Eston Esters. Sherryll Burger, MD     BRS/MEDQ  D:  01/22/2006  T:  01/23/2006  Job:  [id number]

## 2010-06-15 NOTE — H&P (Signed)
NAME:  Sabrina, Roberson                           ACCOUNT NO.:  [id number]   MEDICAL RECORD NO.:  [id number]                   PATIENT TYPE:  INP   LOCATION:  A310                                 FACILITY:  [facility]   PHYSICIAN:  Jerolyn Shin C. Katrinka Blazing, M.D.                DATE OF BIRTH:  05/06/1970   DATE OF ADMISSION:  12/27/2002  DATE OF DISCHARGE:                                HISTORY & PHYSICAL   Sabrina Roberson is a 40 year old female admitted with acute abdominal pain and  appendicitis.  The patient developed severe crampy abdominal  pain about 8 p.m. on November  28.  The pain was crampy across her lower abdomen and periumbilical area.  It radiated to the right lower quadrant.  The pain later settled in the  right lower quadrant.  She had nausea with vomiting and she noted that she  had pain with all movement.  She came to the emergency room after she  started having more pain with chills.  She was seen in the emergency room  and was noted to have tender right lower quadrant.  She had emesis x2 in the  emergency room.  CT of the abdomen revealed unequivocal appendicitis. The  patient is admitted and will have appendectomy.   PAST MEDICAL HISTORY:  She has a history of asthma and hypertension.  She  has not had a recent attack.   MEDICATIONS:  1. Oral contraceptives.  2. Singulair 10 mg tablet daily.  3. Mavik 1 mg daily for hypertension.   The patient is employed.  She smokes one pack of cigarettes per day.  She  drinks two to three beers every two to three days.   FAMILY HISTORY:  Noncontributory.   PHYSICAL EXAMINATION:  VITAL SIGNS:  Blood pressure 135/77, pulse 85,  respirations 16, temperature 98.5.  The patient is in no acute distress at  this time.  HEENT:  Unremarkable.  Pupils are equal and reactive.  Extraocular movements are intact.  Oropharynx is normal.  NECK:  Supple.  No jugular venous distention, bruit, adenopathy or  thyromegaly.  CHEST:  Clear. No rales, rubs, rhonchi  or wheezes.  HEART:  Regular  rate and rhythm.  No murmurs, gallops or rub.  ABDOMEN:  Nondistended. Soft except for right lower quadrant where she has  guarding with tenderness.  EXTREMITIES:  No clubbing, cyanosis or edema.  NEUROLOGIC:  No focal, motor, sensory or cerebellar deficit.   IMPRESSION:  Acute appendicitis.   PLAN:  Laparoscopic appendectomy.   ADDENDUM:  Surgery history revealed that she had a cholecystectomy in  October 2003. This patient will have surgery about noon today.     ___________________________________________  Dirk Dress. Katrinka Blazing, M.D.   LCS/MEDQ  D:  12/27/2002  T:  12/27/2002  Job:  [id number]

## 2010-06-15 NOTE — Op Note (Signed)
Sabrina Roberson, Sabrina Roberson                 ACCOUNT NO.:  [id number]   MEDICAL RECORD NO.:  [id number]          PATIENT TYPE:  OIB   LOCATION:  9309                          FACILITY:  [facility]   PHYSICIAN:  Michelle L. Grewal, M.D.DATE OF BIRTH:  1970/12/28   DATE OF PROCEDURE:  08/29/2005  DATE OF DISCHARGE:                                 OPERATIVE REPORT   PREOP DIAGNOSIS:  Pelvic pain and pelvic relaxation.   POSTOP DIAGNOSIS:  Pelvic pain and pelvic relaxation.   PROCEDURE:  Laparoscopic-assisted vaginal hysterectomy and posterior repair.   SURGEON:  Dr. Vincente Poli.   ASSISTANT:  Dr. Renaldo Fiddler.   ANESTHESIA:  General.   ESTIMATED BLOOD LOSS:  100 mL.   PROCEDURE:  The patient is taken to the operating room.  She is intubated  without any difficulty by Anesthesia.  She is prepped and draped and a Foley  catheter is inserted.  A uterine manipulator was inserted into the uterus.  A small infraumbilical incision was made.  The Veress needle was inserted  and pneumoperitoneum was performed.  We then placed the 11-mm trocar through  the same incision and the patient was placed in Trendelenburg position  after that.  The laparoscope was then introduced through the trocar sheath.  The abdomen and pelvis were inspected.  The uterus was normal except it was  a little boggy grossly consistent with adenomyosis.  No endometriosis or  adhesions were seen.  The ovaries and tubes were normal.  We then used the  gyrus instrument, identified the triple pedicle on either side, placed a 5-  mm suprapubic port under direct visualization and then used the gyrus to  divide the triple pedicle on either side with excellent hemostasis.  We  carried it down to the round ligament on either side.  This was done with  excellent hemostasis.  At this point, we then converted vaginally, where a  small circumferential incision was made around the cervix and the posterior  cul-de-sac was entered sharply and then the anterior  cul-de-sac was entered  sharply.  We then used curved Heaney clamps to clamp just beside the  uterosacral cardinal ligaments just beside the uterus.  Each pedicle was  secured using 0 Vicryl suture.  We walked our way up the broad ligament and  then retroflexed the uterus and the uterus was removed.  The remainder of  tissue was clamped using 0 Vicryl suture.  After the uterus was removed,  pedicles were inspected.  Hemostasis was noted.  The posterior cuff was  closed in running stitch using 0 Vicryl suture and then the vaginal cuff was  closed completely anterior to posterior in a continuous running locked  stitch using 0 Vicryl suture.  At this point, we then proceeded with our  posterior repair.  She had a grade 2 rectocele.  A V-shaped incision was  made in the perineum and a midline incision was made all the way up the back  wall of the vagina.  The rectocele was reduced using sharp and blunt  dissection.  The rectovaginal fascia was then reapproximated in  the midline  using 0 Vicryl interrupted.  The redundant vaginal epithelium was trimmed  and the vagina was closed using 0 Vicryl in a continuous running stitch.  At  this point, vaginal packing was inserted into the vagina.  We then performed  pneumoperitoneum again.  The patient was placed in Trendelenburg position.  The pelvis was irrigated.  Hemostasis was excellent.  The trocars were  removed after  pneumoperitoneum was released and incisions were closed using 0 Vicryl  interrupted.  All sponge, lap and instrument counts were correct x2.  The  patient went to recovery room in stable condition.  Pathology - uterus and  cervix.      Michelle L. Vincente Poli, M.D.  Electronically Signed     MLG/MEDQ  D:  08/30/2005  T:  08/30/2005  Job:  [id number]

## 2010-06-15 NOTE — Discharge Summary (Signed)
Sabrina Roberson, Sabrina Roberson                 ACCOUNT NO.:  [id number]   MEDICAL RECORD NO.:  [id number]          PATIENT TYPE:  INP   LOCATION:  6529                         FACILITY:  [facility]   PHYSICIAN:  Doylene Canning. Ladona Ridgel, MD    DATE OF BIRTH:  04-07-1970   DATE OF ADMISSION:  01/22/2006  DATE OF DISCHARGE:  01/23/2006                               DISCHARGE SUMMARY   ADDENDUM:  The patient was admitted with palpitations, slurred speech, dyspnea; at  the time of discharge, these had all resolved, as in the primary  discharge summary.   CT of the head was negative for intracranial abnormality.  Electrocardiogram shows sinus rhythm and telemetry has showed continued  sinus rhythm.  The patient at discharge feels better, his speech has  cleared.  Part of this may be a peri-migraine syndrome, but, in further  discussion with the patient, she says that since her visit with Dr.  Ladona Ridgel in April discussing palpitations and rapid heartbeat, that she  has on many occasions has transmitted electrocardiograms which have been  nonspecific to the [facility] office on [address]  However,  these palpitations do continue.  She feels them as heart racing as high  as 150's.  She was on digoxin, but this made her feel so fatigued that  she has gone off that, so she has no real medical coverage for this.  I  thought that maybe it would be a good opportunity for her to see Dr.  Ladona Ridgel in consultation in the office to follow-up these continue  palpitations and possibly schedule for an electrophysiology study to see  if there were anything that could be brought to light that way, or  perhaps for a monitor; I am not sure which of those Dr. Ladona Ridgel would  prefer.  She is to see him Wednesday, February 12, 2006 at 10:30.      Maple Mirza, PA      Doylene Canning. Ladona Ridgel, MD  Electronically Signed    GM/MEDQ  D:  01/23/2006  T:  01/23/2006  Job:  [id number]   cc:   Macarthur Critchley. Shelva Majestic, M.D.

## 2010-06-15 NOTE — Op Note (Signed)
Sabrina Roberson, Sabrina Roberson                 ACCOUNT NO.:  [id number]   MEDICAL RECORD NO.:  [id number]          PATIENT TYPE:  AMB   LOCATION:  ENDO                         FACILITY:  [facility]   PHYSICIAN:  Georgiana Spinner, M.D.    DATE OF BIRTH:  06-07-1970   DATE OF PROCEDURE:  08/17/2004  DATE OF DISCHARGE:                                 OPERATIVE REPORT   PROCEDURE:  Colonoscopy.   INDICATIONS FOR PROCEDURE:  Hemoccult positivity.   ANESTHESIA:  Fentanyl 25 mcg, Versed 4 mg, Phenergan 12.5 mg.   DESCRIPTION OF PROCEDURE:  With the patient mildly sedated in the left  lateral decubitus position, the Olympus videoscopic colonoscope was inserted  in the rectum and passed under direct vision to the cecum identified by the  ileocecal valve and appendiceal orifice both of which were photographed. We  then entered into the terminal ileum through the ileocecal valve. It too  appeared normal. From this point, the colonoscope was slowly withdrawn  taking circumferential views of the colonic mucosa stopping at approximately  10 cm from the anal verge at which point an erythematous area was seen and  biopsied to rule out localized colitis versus just prep trauma. The  endoscope was placed in retroflexion to view the anal canal from above and  an internal hemorrhoid was seen. The endoscope was straightened and  withdrawn. The patient's vital signs and pulse oximeter remained stable. The  patient tolerated the procedure well without apparent complications.   FINDINGS:  Reddened area with mucoid discharge at approximately 10 cm from  the anal verge. Rule out procedure trauma, rule out localized colitis,  internal hemorrhoid, otherwise an unremarkable colonoscopic examination to  and including the terminal ileum.   PLAN:  Await biopsy report. The patient will call me for results and  followup with me as an outpatient.       GMO/MEDQ  D:  08/17/2004  T:  08/17/2004  Job:  [id number]   cc:   Delaney Meigs, M.D.  [address]  [place]  [place] [address]  Fax: [phone]

## 2010-06-26 ENCOUNTER — Encounter: Payer: Self-pay | Admitting: Family Medicine

## 2010-10-02 ENCOUNTER — Other Ambulatory Visit: Payer: Self-pay | Admitting: Obstetrics and Gynecology

## 2010-10-02 DIAGNOSIS — N6310 Unspecified lump in the right breast, unspecified quadrant: Secondary | ICD-10-CM

## 2010-10-04 ENCOUNTER — Ambulatory Visit
Admission: RE | Admit: 2010-10-04 | Discharge: 2010-10-04 | Disposition: A | Discharge status: Home / Self Care | Payer: 59 | Source: Ambulatory Visit | Attending: Obstetrics and Gynecology | Admitting: Obstetrics and Gynecology

## 2010-10-04 DIAGNOSIS — N6310 Unspecified lump in the right breast, unspecified quadrant: Secondary | ICD-10-CM

## 2010-10-10 ENCOUNTER — Encounter (INDEPENDENT_AMBULATORY_CARE_PROVIDER_SITE_OTHER): Payer: Self-pay | Admitting: General Surgery

## 2010-10-10 ENCOUNTER — Ambulatory Visit (INDEPENDENT_AMBULATORY_CARE_PROVIDER_SITE_OTHER): Payer: 59 | Admitting: General Surgery

## 2010-10-10 VITALS — BP 128/80 | HR 70 | Temp 97.0°F

## 2010-10-10 DIAGNOSIS — N63 Unspecified lump in unspecified breast: Secondary | ICD-10-CM

## 2010-10-10 DIAGNOSIS — N6310 Unspecified lump in the right breast, unspecified quadrant: Secondary | ICD-10-CM

## 2010-10-10 NOTE — Patient Instructions (Signed)
The lump in your right breast in the areola may be an obstructed milk duct or a cyst. There may be low grade infection. You will be scheduled for surgery to excise this in the operating room under general anesthesia in the next few days.

## 2010-10-10 NOTE — Progress Notes (Signed)
Chief Complaint  Patient presents with  . Other    Abscess around right breast areola. Discharge started today. Small amount had white spot, then green in color. No more discharge since that time.    HPI Sabrina Roberson is a 40 y.o. female.    This is a 40 year old Caucasian female, sent to me for the courtesy of Dr. Marcelle Overlie for evaluation of a palpable mass in the right breast inside the areala margin.  The patient states that she has never had a breast problem before. She notices a 10:00 for the past 3 weeks just medial to the right nipple. She has not had any skin drainage or erythema. She may have had one drop of drainage yesterday. It has not gotten worse over the past 3 weeks but it has not gotten any better. She is 10 days of doxycycline which did not make it better or worse.  A mammogram was done by Dr. Cain Saupe. This showed no evidence of malignancy. The palpable finding in the right areola corresponds to a skin lesion seen mammographically. Dr. Deboraha Sprang possibly said this might represent an obstructed Montgomery gland.  The patient is frustrated by its persistence and its appearance and would like something done. HPI  Past Medical History  Diagnosis Date  . Allergy   . Anxiety   . Asthma   . GERD (gastroesophageal reflux disease)   . Hypertension   . Thyroid disease   . TIA (transient ischemic attack)   . Hearing loss   . Bruises easily   . Leg swelling     both  . Cough     Past Surgical History  Procedure Date  . Cholecystectomy   . Appendectomy   . Abdominal hysterectomy     Family History  Problem Relation Age of Onset  . Hypertension Sister   . Cancer Paternal Grandmother     breast    Social History History  Substance Use Topics  . Smoking status: Current Everyday Smoker -- 0.5 packs/day  . Smokeless tobacco: Not on file  . Alcohol Use: No    No Known Allergies  Current Outpatient Prescriptions  Medication Sig Dispense Refill  .  diazepam (VALIUM) 5 MG tablet Take 5 mg by mouth daily.        . furosemide (LASIX) 40 MG tablet Take 40 mg by mouth daily.        Marland Kitchen levothyroxine (SYNTHROID) 112 MCG tablet Take 112 mcg by mouth daily.        . propranolol (INDERAL LA) 80 MG 24 hr capsule Take 80 mg by mouth daily.          Review of Systems Review of Systems 12 system review of systems is performed and is negative except as described above. Blood pressure 128/80, pulse 70, temperature 97 F (36.1 C), temperature source Temporal.  Physical Exam Physical Exam  Constitutional: She appears well-developed and well-nourished. No distress.  HENT:  Head: Normocephalic and atraumatic.  Nose: Nose normal.  Mouth/Throat: No oropharyngeal exudate.  Eyes: Pupils are equal, round, and reactive to light. No scleral icterus.  Neck: Normal range of motion. Neck supple. No JVD present. No tracheal deviation present. Thyromegaly present.  Cardiovascular: Normal rate, normal heart sounds and intact distal pulses.   No murmur heard. Pulmonary/Chest: Breath sounds normal. No respiratory distress. She has no wheezes. She has no rales. She exhibits no tenderness.    Abdominal: Bowel sounds are normal.  Musculoskeletal: She exhibits no edema and  no tenderness.  Skin: Skin is warm and dry. No rash noted. No erythema. No pallor.       tanned  Psychiatric: She has a normal mood and affect. Her behavior is normal. Thought content normal.    Data Reviewed I reviewed Dr. Lynnell Dike office notes and the mammogram report and the mammogram films.  Assessment    Cystic mass right breast, medial area of areola. This may be an obstructed Montgomery gland or some variant of cystic mastitis. It does not appear acutely infected infected today.  Tobacco abuse.    Plan    We talked about the differential diagnosis, and different approaches to manage this. Ultimately this will need to be excised. She would like this done as soon as possible.  I  think the best approach here is to explore and excise this area in the operating room under general anesthesia. It may simply be a local excision or I might have to trace out a lactiferous duct to get complete excision.  I have discussed the indications and details of surgery with the patient and her husband. Risks and complications have been outlined, including but not limited to bleeding, recurrent infection, breast deformity, nipple deformity, possibly having to take it incision onto the nipple or out beyond the areolar margin. She is to understand all these issues well. His followup her questions are answered. She is in full agreement with this plan.       Ernestene Mention 10/10/2010, 4:34 PM

## 2010-10-11 ENCOUNTER — Telehealth (INDEPENDENT_AMBULATORY_CARE_PROVIDER_SITE_OTHER): Payer: Self-pay

## 2010-10-11 NOTE — Telephone Encounter (Signed)
Pt called c/o some bloody drainage from area on breast at site for surgery. Reviewed with Dr Derrell Lolling. Shower, cover dry dsg and proceed with surgery on Saturday. Pt advised.

## 2010-10-12 ENCOUNTER — Telehealth (INDEPENDENT_AMBULATORY_CARE_PROVIDER_SITE_OTHER): Payer: Self-pay

## 2010-10-12 NOTE — Telephone Encounter (Signed)
Per Jasmine December at [facility] pre admit, pt declined taking off work today to get pre op work done. Pt also mentioned to Jasmine December that in the past pt had a heart procedure done at [facility] but could not recall what type. Jasmine December advised that pt did not indicate this at her office visit. Reviewed with Dr Derrell Lolling. Per Dr Derrell Lolling ok to proceed with surgery if anesthesia agrees to proceed with procedure. Jasmine December reviewed this with Dr Karlyn Agee in anesthesia and states pt may proceed with surgery. Jasmine December will have pt arrive early to do pre op labs and ekg. Note to Dr Derrell Lolling for review.

## 2010-10-13 ENCOUNTER — Ambulatory Visit (HOSPITAL_COMMUNITY)
Admission: RE | Admit: 2010-10-13 | Discharge: 2010-10-13 | Disposition: A | Discharge status: Home / Self Care | Payer: 59 | Source: Ambulatory Visit | Attending: General Surgery | Admitting: General Surgery

## 2010-10-13 ENCOUNTER — Other Ambulatory Visit (HOSPITAL_COMMUNITY): Payer: Self-pay | Admitting: General Surgery

## 2010-10-13 ENCOUNTER — Other Ambulatory Visit (INDEPENDENT_AMBULATORY_CARE_PROVIDER_SITE_OTHER): Payer: Self-pay | Admitting: General Surgery

## 2010-10-13 DIAGNOSIS — I10 Essential (primary) hypertension: Secondary | ICD-10-CM | POA: Insufficient documentation

## 2010-10-13 DIAGNOSIS — N6089 Other benign mammary dysplasias of unspecified breast: Secondary | ICD-10-CM | POA: Insufficient documentation

## 2010-10-13 DIAGNOSIS — Z8673 Personal history of transient ischemic attack (TIA), and cerebral infarction without residual deficits: Secondary | ICD-10-CM | POA: Insufficient documentation

## 2010-10-13 DIAGNOSIS — R05 Cough: Secondary | ICD-10-CM

## 2010-10-13 DIAGNOSIS — Z01818 Encounter for other preprocedural examination: Secondary | ICD-10-CM | POA: Insufficient documentation

## 2010-10-13 DIAGNOSIS — F17200 Nicotine dependence, unspecified, uncomplicated: Secondary | ICD-10-CM | POA: Insufficient documentation

## 2010-10-13 DIAGNOSIS — J45909 Unspecified asthma, uncomplicated: Secondary | ICD-10-CM | POA: Insufficient documentation

## 2010-10-13 DIAGNOSIS — G4733 Obstructive sleep apnea (adult) (pediatric): Secondary | ICD-10-CM | POA: Insufficient documentation

## 2010-10-13 DIAGNOSIS — K219 Gastro-esophageal reflux disease without esophagitis: Secondary | ICD-10-CM | POA: Insufficient documentation

## 2010-10-13 LAB — DIFFERENTIAL
Basophils Absolute: 0 10*3/uL (ref 0.0–0.1)
Eosinophils Relative: 2 % (ref 0–5)
Lymphocytes Relative: 35 % (ref 12–46)

## 2010-10-13 LAB — COMPREHENSIVE METABOLIC PANEL
ALT: 10 U/L (ref 0–35)
AST: 16 U/L (ref 0–37)
Albumin: 3.2 g/dL — ABNORMAL LOW (ref 3.5–5.2)
Alkaline Phosphatase: 53 U/L (ref 39–117)
BUN: 15 mg/dL (ref 6–23)
Chloride: 105 mEq/L (ref 96–112)
Potassium: 3.5 mEq/L (ref 3.5–5.1)
Sodium: 138 mEq/L (ref 135–145)
Total Bilirubin: 0.1 mg/dL — ABNORMAL LOW (ref 0.3–1.2)

## 2010-10-13 LAB — CBC
HCT: 38.6 % (ref 36.0–46.0)
Hemoglobin: 13 g/dL (ref 12.0–15.0)
MCH: 31.6 pg (ref 26.0–34.0)
MCHC: 33.7 g/dL (ref 30.0–36.0)
MCV: 93.9 fL (ref 78.0–100.0)
Platelets: 224 10*3/uL (ref 150–400)
RBC: 4.11 MIL/uL (ref 3.87–5.11)
RDW: 13.4 % (ref 11.5–15.5)
WBC: 5.2 10*3/uL (ref 4.0–10.5)

## 2010-10-13 NOTE — Op Note (Signed)
Sabrina Roberson, Sabrina Roberson                 ACCOUNT NO.:  [id number]  MEDICAL RECORD NO.:  [id number]  LOCATION:  XRAY                         FACILITY:  [facility]  PHYSICIAN:  Angelia Mould. Derrell Lolling, M.D.DATE OF BIRTH:  1970/03/21  DATE OF PROCEDURE:  10/13/2010 DATE OF DISCHARGE:                              OPERATIVE REPORT   PREOPERATIVE DIAGNOSIS:  Right breast mass, suspect obstructed Montgomery gland or cystic mastitis.  POSTOPERATIVE DIAGNOSIS:  Right breast mass, suspect obstructed Montgomery gland or cystic mastitis.  OPERATION PERFORMED:  Excise right breast mass.  SURGEON:  Angelia Mould. Derrell Lolling, MD  OPERATIVE INDICATIONS:  This is a 40 year old Caucasian female without any prior history of breast problems.  For 3-4 weeks, she has noticed a mass protruding up through the skin of the right areola medial to the right nipple.  She denies drainage or erythema.  She was given doxycycline for 10 days, which did not affect the mass.  On exam, she has a 1-cm cystic mass in the right areola medial to the nipple at the 3 o'clock position.  This does not look infected.  It does look like an obstructed Montgomery gland.  Mammogram showed no evidence of malignancy.  This looks like a skin lesion mammographically.  She was brought to the operating room for excision of this area.  OPERATIVE TECHNIQUE:  Following the induction of general endotracheal anesthesia, intravenous antibiotics were given.  The patient was identified as correct patient, correct procedure and correct site.  The right breast was prepped and draped in a sterile fashion.  0.5% Marcaine with epinephrine was used as local infiltration anesthetic.  At the breast on tension,  I made a radially oriented elliptical incision around this mass.  Dissection was carried down into the subdermal area and around the mass.  It was completely excised.  There was no purulence.  When I took this to the back table, there was a partial cystic  component to it. It did not appear to communicate with a ductal structure.   There was a little bit of fluid and I cultured that although it did not look purulent.  The wound was irrigated with saline.  Hemostasis was excellent and achieved with electrocautery.  I closed the skin with several interrupted subcuticular sutures of 4-0 Monocryl.  I did not put any Dermabond or Steri-Strips.  I wanted to be sure the wound was opened and could drain.  Clean bandage was placed and the patient awakened and taken to the recovery room.  Estimated blood loss was 5-10 cc.  Complications none.  Sponge, needle and instrument counts were correct.     Angelia Mould. Derrell Lolling, M.D.     HMI/MEDQ  D:  10/13/2010  T:  10/13/2010  Job:  [id number]  cc:   Marcelino Duster L. Vincente Poli, M.D. Fax: [phone]  Daryl Eastern, M.D. Fax: [phone]  Electronically Signed by Claud Kelp M.D. on 10/13/2010 03:00:50 PM

## 2010-10-15 LAB — WOUND CULTURE: Culture: NO GROWTH

## 2010-10-18 LAB — ANAEROBIC CULTURE

## 2010-10-19 ENCOUNTER — Ambulatory Visit (INDEPENDENT_AMBULATORY_CARE_PROVIDER_SITE_OTHER): Payer: 59

## 2010-10-19 ENCOUNTER — Inpatient Hospital Stay (INDEPENDENT_AMBULATORY_CARE_PROVIDER_SITE_OTHER)
Admission: RE | Admit: 2010-10-19 | Discharge: 2010-10-19 | Disposition: A | Discharge status: Home / Self Care | Payer: 59 | Source: Ambulatory Visit | Attending: Emergency Medicine | Admitting: Emergency Medicine

## 2010-10-19 DIAGNOSIS — R0789 Other chest pain: Secondary | ICD-10-CM

## 2010-10-23 ENCOUNTER — Encounter (INDEPENDENT_AMBULATORY_CARE_PROVIDER_SITE_OTHER): Payer: 59 | Admitting: General Surgery

## 2010-10-23 ENCOUNTER — Ambulatory Visit (INDEPENDENT_AMBULATORY_CARE_PROVIDER_SITE_OTHER): Payer: 59 | Admitting: General Surgery

## 2010-10-23 ENCOUNTER — Encounter (INDEPENDENT_AMBULATORY_CARE_PROVIDER_SITE_OTHER): Payer: Self-pay | Admitting: General Surgery

## 2010-10-23 VITALS — BP 108/70 | HR 60 | Temp 97.6°F | Resp 14 | Ht 64.0 in | Wt 140.1 lb

## 2010-10-23 DIAGNOSIS — Z9889 Other specified postprocedural states: Secondary | ICD-10-CM

## 2010-10-23 NOTE — Patient Instructions (Signed)
The pathology report on your breast biopsy shows a benign epidermoid inclusion cyst. There is no evidence of cancer. Wound is healing well. I advise you to get  mammograms at age 40 and annually thereafter. Return to see me if there are any new problems in the future.

## 2010-10-23 NOTE — Progress Notes (Signed)
Subjective:     Patient ID: Sabrina Roberson, female   DOB: 09-21-1970, 40 y.o.   MRN: [id number]  HPI  This 40 year old woman underwent breast biopsy of a cystic mass inside the areolar margin. Pathology report shows a benign epidermoid inclusion cyst. She has no problems with wound healing. She was given a copy of her pathology reports today that was explained to her. Review of Systems     Objective:   Physical Exam The wound is healing very nicely. There is no sign of hematoma, drainage, seroma or infection.     Assessment:     Epidermoid inclusion cyst of breast, recovering uneventfully following excisional biopsy.  Tobacco abuse, counseled to quit.    Plan:     Advised mammograms at age 40 and annually thereafter.  Advised to quit smoking.  Return to see me p.r.n.

## 2010-10-24 LAB — DIFFERENTIAL
Basophils Absolute: 0.1
Basophils Relative: 1
Eosinophils Absolute: 0.2
Eosinophils Relative: 3
Lymphocytes Relative: 32
Lymphs Abs: 2.4
Monocytes Absolute: 0.5
Monocytes Relative: 6
Neutro Abs: 4.4
Neutrophils Relative %: 58

## 2010-10-24 LAB — POCT I-STAT, CHEM 8
Calcium, Ion: 1.08 — ABNORMAL LOW
Creatinine, Ser: 1.1
Hemoglobin: 14.3
Sodium: 142
TCO2: 26

## 2010-10-24 LAB — CBC
HCT: 39.8
Hemoglobin: 14
MCHC: 35.2
MCV: 95.7
Platelets: 282
RBC: 4.16
RDW: 13.3
WBC: 7.5

## 2010-10-29 ENCOUNTER — Telehealth (INDEPENDENT_AMBULATORY_CARE_PROVIDER_SITE_OTHER): Payer: Self-pay

## 2010-10-29 NOTE — Telephone Encounter (Signed)
Contacted patient to advise her she needs to be seen, she is scheduled to see Dr Michaell Cowing in urge. Pt states wound has opened and she is draining a lot. Drainage is a lot and has turned into "a Jelly pus"

## 2010-10-29 NOTE — Telephone Encounter (Signed)
Pt called and stated she called in Saturday and her wound was open, sore, red and draining pus.  She spoke to Dr Michaell Cowing and he prescribed Amoxicillin.  She called in to let Dr Derrell Lolling know.  She just wants to know what else should she do?  She is showering daily and packing the wound.  Please call her and let her know if she should see Dr Derrell Lolling next week or come to urgent office this week.  She couldn't come to urgent office today.

## 2010-10-30 ENCOUNTER — Ambulatory Visit (INDEPENDENT_AMBULATORY_CARE_PROVIDER_SITE_OTHER): Payer: 59 | Admitting: Surgery

## 2010-10-30 ENCOUNTER — Encounter (INDEPENDENT_AMBULATORY_CARE_PROVIDER_SITE_OTHER): Payer: Self-pay | Admitting: Surgery

## 2010-10-30 VITALS — BP 124/78 | HR 72 | Temp 97.4°F | Resp 20 | Ht 64.0 in | Wt 139.4 lb

## 2010-10-30 DIAGNOSIS — N6089 Other benign mammary dysplasias of unspecified breast: Secondary | ICD-10-CM

## 2010-10-30 HISTORY — DX: Other benign mammary dysplasias of unspecified breast: N60.89

## 2010-10-30 NOTE — Patient Instructions (Addendum)
Abscess/Boil (Furuncle) An abscess (boil or furuncle) is an infected area that contains a collection of pus.  SYMPTOMS Signs and symptoms of an abscess include pain, tenderness, redness, or hardness. You may feel a moveable soft area under your skin. An abscess can occur anywhere in the body.  TREATMENT An incision (cut by the caregiver) may have been made over your abscess so the pus could be drained out. Gauze may have been packed into the space or a drain may have been looped thru the abscess cavity (pocket). This provides a drain that will allow the cavity to heal from the inside outwards. The abscess may be painful for a few days, but should feel much better if it was drained. Your abscess, if seen early, may not have localized and may not have been drained. If not, another appointment may be required if it does not get better on its own or with medications. HOME CARE INSTRUCTIONS  Only take over-the-counter or prescription medicines for pain, discomfort, or fever as directed by your caregiver.   Keep the skin and clothes clean around your abscess.   If the abscess was drained, you will need to use gauze dressing ("4x4") to collect any draining pus. These dressing typically will need to be changed 3 or more times during the day.   The infection may spread by skin contact with others. Avoid skin contact as much as possible.   Good hygiene is very important including regular hand washing, cover any draining skin lesions, and don't share personal care items.   If you participate in sports do not share athletic equipment, towels, whirlpools, or personal care items. Shower after every practice or tournament.   If a draining area cannot be adequately covered:   Do not participate in sports   Children should not participate in day care until the wound has healed or drainage stops.   If your caregiver has given you a follow-up appointment, it is very important to keep that appointment. Not  keeping the appointment could result in a much worse infection, chronic or permanent injury, pain, and disability. If there is any problem keeping the appointment, you must call back to this facility for assistance.  SEEK MEDICAL CARE IF:  You develop increased pain, swelling, redness, drainage, or bleeding in the wound site.   You develop signs of generalized infection including muscle aches, chills, fever, or a general ill feeling.   You or your child has an oral temperature above 102F.   Your baby is older than 3 months with a rectal temperature of 100.5 F (38.1 C) or higher for more than 1 day.  See your caregiver as directed for a recheck or sooner if you develop any of the symptoms described above. Take antibiotics (medicine that kills germs) as directed if they were prescribed. MAKE SURE YOU:   Understand these instructions.   Will watch your condition.   Will get help right away if you are not doing well or get worse.  Document Released: 10/24/2004 Document Re-Released: 07/04/2009 [facility] Patient Information 2011 [place], [place].  We strongly recommend that you stop smoking.  Smoking increases the risk of surgery including infection in the form of an open wound, pus formation, abscess, hernia at an incision on the abdomen, etc.  You have an increased risk of other MAJOR complications such as stroke, heart attack, forming clots in the leg and/or lungs, and death.    While it can be one of the most difficult things to do,  the [place] community has programs to help you stop.  Consider talking with your primary care physician about options.  Also, Smoking Cessation classes are available through the [facility]. Call [phone] or check the Classes and Support Groups section of the Web site such as: http://www.hanson.biz/.cfm?id=1235

## 2010-10-30 NOTE — Progress Notes (Signed)
Subjective:     Patient ID: Sabrina Roberson, female   DOB: [birth year], 40 y.o.   MRN: [id number]  HPI  Patient Care Team: Josue Hector as PCP - General (Family Medicine) Jeani Hawking, MD as Consulting Physician (Obstetrics and Gynecology)  This patient is a 40 y.o.female who presents today for surgical evaluation.   Procedure: Excisional biopsy of right subaortic areolar breast mass. 10/10/2010  Pathology: Epidermoid cyst of breast  Reason for visit: Incision dehiscence and drainage.  Patient called 3 days ago over the weekend. She noted she was having some pain and swelling. I started her on oral Augmentin. I recommend she use compresses. I recommended that if it did not improve to come and see [place]. She comes in today. She not started draining yesterday. It feels better. She denies any fevers chills sweats. Her energy level is good.   Past Medical History  Diagnosis Date  . Allergy   . Anxiety   . Asthma   . GERD (gastroesophageal reflux disease)   . Hypertension   . Thyroid disease   . TIA (transient ischemic attack)   . Hearing loss   . Bruises easily   . Leg swelling     both  . Cough     Past Surgical History  Procedure Date  . Cholecystectomy   . Appendectomy   . Abdominal hysterectomy     History   Social History  . Marital Status: Married    Spouse Name: N/A    Number of Children: N/A  . Years of Education: N/A   Occupational History  . Not on file.   Social History Main Topics  . Smoking status: Current Everyday Smoker -- 0.5 packs/day  . Smokeless tobacco: Not on file  . Alcohol Use: No  . Drug Use: No  . Sexually Active:    Other Topics Concern  . Not on file   Social History Narrative  . No narrative on file    Family History  Problem Relation Age of Onset  . Hypertension Sister   . Cancer Paternal Grandmother     breast    Current outpatient prescriptions:amoxicillin (AMOXIL) 250 MG capsule, Take 8.75-12 mg by mouth 2  (two) times daily.  , Disp: , Rfl: ;  diazepam (VALIUM) 5 MG tablet, Take 5 mg by mouth daily.  , Disp: , Rfl: ;  furosemide (LASIX) 40 MG tablet, Take 40 mg by mouth daily.  , Disp: , Rfl: ;  levothyroxine (SYNTHROID) 112 MCG tablet, Take 112 mcg by mouth daily.  , Disp: , Rfl:  propranolol (INDERAL LA) 80 MG 24 hr capsule, Take 80 mg by mouth daily.  , Disp: , Rfl:   No Known Allergies     Review of Systems  Constitutional: Negative for fever, chills and diaphoresis.  HENT: Negative for ear pain, sore throat and trouble swallowing.   Eyes: Negative for photophobia and visual disturbance.  Respiratory: Negative for cough and choking.   Cardiovascular: Negative for chest pain and palpitations.  Gastrointestinal: Negative for nausea, vomiting, abdominal pain, diarrhea, constipation, anal bleeding and rectal pain.  Genitourinary: Negative for dysuria, frequency and difficulty urinating.  Musculoskeletal: Negative for myalgias and gait problem.  Skin: Negative for color change, pallor and rash.  Neurological: Negative for dizziness, speech difficulty, weakness and numbness.  Hematological: Negative for adenopathy.  Psychiatric/Behavioral: Negative for confusion and agitation. The patient is not nervous/anxious.        Objective:   Physical Exam  Constitutional: She is oriented to person, place, and time. She appears well-developed and well-nourished. No distress.  HENT:  Head: Normocephalic.  Mouth/Throat: Oropharynx is clear and moist. No oropharyngeal exudate.  Eyes: Conjunctivae and EOM are normal. Pupils are equal, round, and reactive to light. No scleral icterus.  Neck: Normal range of motion. No tracheal deviation present.  Cardiovascular: Normal rate and intact distal pulses.   Pulmonary/Chest: Effort normal. No respiratory distress. She exhibits no tenderness.    Abdominal: Soft. She exhibits no distension. There is no tenderness. Hernia confirmed negative in the right  inguinal area and confirmed negative in the left inguinal area.       Incisions clean with normal healing ridges.  No hernias  Musculoskeletal: Normal range of motion. She exhibits no tenderness.  Lymphadenopathy:       Right: No inguinal adenopathy present.       Left: No inguinal adenopathy present.  Neurological: She is alert and oriented to person, place, and time. No cranial nerve deficit. She exhibits normal muscle tone. Coordination normal.  Skin: Skin is warm and dry. No rash noted. She is not diaphoretic.  Psychiatric: She has a normal mood and affect. Her behavior is normal.       Assessment:     Probable postop seroma spontaneously draining. Possible reinfection. Improved on oral antibiotics and drainage.    Plan:     Complete 7 days of antibiotics.  Warm compresses and dressings to cover the wound. Its rather superficial and would not require packing.  I suspect it will heal up and dry put him in a week or so. Return to clinic in one week.  I noted if it does not improve or worsens to see [place] sooner. She  Stop smoking.  We talked to the patient about the dangers of smoking.  We stressed that tobacco use dramatically increases the risk of peri-operative complications such as infection, tissue necrosis leaving to problems with incision/wound and organ healing, heart attack, stroke, DVT, pulmonary embolism, and death.  We noted there are programs in our community to help stop smoking.

## 2010-11-12 ENCOUNTER — Encounter (INDEPENDENT_AMBULATORY_CARE_PROVIDER_SITE_OTHER): Payer: 59 | Admitting: Surgery

## 2011-01-10 ENCOUNTER — Other Ambulatory Visit (HOSPITAL_COMMUNITY): Payer: Self-pay | Admitting: Neurosurgery

## 2011-01-10 DIAGNOSIS — M549 Dorsalgia, unspecified: Secondary | ICD-10-CM

## 2011-01-10 DIAGNOSIS — M5410 Radiculopathy, site unspecified: Secondary | ICD-10-CM

## 2011-01-17 ENCOUNTER — Other Ambulatory Visit (HOSPITAL_COMMUNITY): Payer: 59

## 2011-01-18 ENCOUNTER — Ambulatory Visit (HOSPITAL_COMMUNITY)
Admission: RE | Admit: 2011-01-18 | Discharge: 2011-01-18 | Disposition: A | Discharge status: Home / Self Care | Payer: 59 | Source: Ambulatory Visit | Attending: Neurosurgery | Admitting: Neurosurgery

## 2011-01-18 DIAGNOSIS — M549 Dorsalgia, unspecified: Secondary | ICD-10-CM

## 2011-01-18 DIAGNOSIS — M5126 Other intervertebral disc displacement, lumbar region: Secondary | ICD-10-CM | POA: Insufficient documentation

## 2011-01-18 DIAGNOSIS — M47812 Spondylosis without myelopathy or radiculopathy, cervical region: Secondary | ICD-10-CM | POA: Insufficient documentation

## 2011-01-18 DIAGNOSIS — R079 Chest pain, unspecified: Secondary | ICD-10-CM | POA: Insufficient documentation

## 2011-01-18 DIAGNOSIS — M5124 Other intervertebral disc displacement, thoracic region: Secondary | ICD-10-CM | POA: Insufficient documentation

## 2011-01-18 DIAGNOSIS — M5410 Radiculopathy, site unspecified: Secondary | ICD-10-CM

## 2011-02-09 ENCOUNTER — Emergency Department (HOSPITAL_COMMUNITY)
Admission: EM | Admit: 2011-02-09 | Discharge: 2011-02-10 | Disposition: A | Discharge status: Home / Self Care | Payer: 59 | Attending: Emergency Medicine | Admitting: Emergency Medicine

## 2011-02-09 ENCOUNTER — Emergency Department (HOSPITAL_COMMUNITY): Payer: 59

## 2011-02-09 ENCOUNTER — Encounter (HOSPITAL_COMMUNITY): Payer: Self-pay | Admitting: *Deleted

## 2011-02-09 DIAGNOSIS — M79609 Pain in unspecified limb: Secondary | ICD-10-CM | POA: Insufficient documentation

## 2011-02-09 DIAGNOSIS — J45909 Unspecified asthma, uncomplicated: Secondary | ICD-10-CM | POA: Insufficient documentation

## 2011-02-09 DIAGNOSIS — R29898 Other symptoms and signs involving the musculoskeletal system: Secondary | ICD-10-CM | POA: Insufficient documentation

## 2011-02-09 DIAGNOSIS — R209 Unspecified disturbances of skin sensation: Secondary | ICD-10-CM | POA: Insufficient documentation

## 2011-02-09 DIAGNOSIS — Z79899 Other long term (current) drug therapy: Secondary | ICD-10-CM | POA: Insufficient documentation

## 2011-02-09 DIAGNOSIS — R42 Dizziness and giddiness: Secondary | ICD-10-CM | POA: Insufficient documentation

## 2011-02-09 DIAGNOSIS — G43909 Migraine, unspecified, not intractable, without status migrainosus: Secondary | ICD-10-CM | POA: Insufficient documentation

## 2011-02-09 DIAGNOSIS — M79602 Pain in left arm: Secondary | ICD-10-CM

## 2011-02-09 DIAGNOSIS — K219 Gastro-esophageal reflux disease without esophagitis: Secondary | ICD-10-CM | POA: Insufficient documentation

## 2011-02-09 DIAGNOSIS — Z8673 Personal history of transient ischemic attack (TIA), and cerebral infarction without residual deficits: Secondary | ICD-10-CM | POA: Insufficient documentation

## 2011-02-09 DIAGNOSIS — R202 Paresthesia of skin: Secondary | ICD-10-CM

## 2011-02-09 DIAGNOSIS — I10 Essential (primary) hypertension: Secondary | ICD-10-CM | POA: Insufficient documentation

## 2011-02-09 DIAGNOSIS — R29818 Other symptoms and signs involving the nervous system: Secondary | ICD-10-CM | POA: Insufficient documentation

## 2011-02-09 DIAGNOSIS — R4701 Aphasia: Secondary | ICD-10-CM | POA: Insufficient documentation

## 2011-02-09 LAB — DIFFERENTIAL
Basophils Absolute: 0 10*3/uL (ref 0.0–0.1)
Basophils Relative: 0 % (ref 0–1)
Lymphocytes Relative: 36 % (ref 12–46)
Monocytes Absolute: 0.3 10*3/uL (ref 0.1–1.0)
Neutro Abs: 3.7 10*3/uL (ref 1.7–7.7)
Neutrophils Relative %: 56 % (ref 43–77)

## 2011-02-09 LAB — CBC
HCT: 38.3 % (ref 36.0–46.0)
MCHC: 33.2 g/dL (ref 30.0–36.0)
Platelets: 226 10*3/uL (ref 150–400)
RDW: 13.7 % (ref 11.5–15.5)
WBC: 6.6 10*3/uL (ref 4.0–10.5)

## 2011-02-09 LAB — PROTIME-INR
INR: 0.94 (ref 0.00–1.49)
Prothrombin Time: 12.8 seconds (ref 11.6–15.2)

## 2011-02-09 LAB — BASIC METABOLIC PANEL
Chloride: 102 mEq/L (ref 96–112)
Creatinine, Ser: 1.05 mg/dL (ref 0.50–1.10)
GFR calc Af Amer: 77 mL/min — ABNORMAL LOW (ref >90)
Sodium: 141 mEq/L (ref 135–145)

## 2011-02-09 LAB — POCT I-STAT TROPONIN I

## 2011-02-09 MED ORDER — SODIUM CHLORIDE 0.9 % IV BOLUS (SEPSIS)
1000.0000 mL | Freq: Once | INTRAVENOUS | Status: AC
  Administered 2011-02-09: 1000 mL via INTRAVENOUS

## 2011-02-09 MED ORDER — GADOBENATE DIMEGLUMINE 529 MG/ML IV SOLN
13.0000 mL | Freq: Once | INTRAVENOUS | Status: AC
  Administered 2011-02-09: 13 mL via INTRAVENOUS

## 2011-02-09 MED ORDER — METOCLOPRAMIDE HCL 5 MG/ML IJ SOLN
10.0000 mg | Freq: Once | INTRAMUSCULAR | Status: AC
  Administered 2011-02-09: 10 mg via INTRAVENOUS
  Filled 2011-02-09: qty 2

## 2011-02-09 MED ORDER — KETOROLAC TROMETHAMINE 30 MG/ML IJ SOLN
30.0000 mg | Freq: Once | INTRAMUSCULAR | Status: AC
  Administered 2011-02-09: 30 mg via INTRAVENOUS
  Filled 2011-02-09: qty 1

## 2011-02-09 MED ORDER — DIPHENHYDRAMINE HCL 50 MG/ML IJ SOLN
25.0000 mg | Freq: Once | INTRAMUSCULAR | Status: AC
  Administered 2011-02-09: 25 mg via INTRAVENOUS
  Filled 2011-02-09: qty 1

## 2011-02-09 NOTE — ED Provider Notes (Signed)
History     CSN: [id number]  Arrival date & time 02/09/11  1516   First MD Initiated Contact with Patient 02/09/11 1651      Chief Complaint  Patient presents with  . Aphasia  . left side numbess     (Consider location/radiation/quality/duration/timing/severity/associated sxs/prior treatment) HPI Comments: Patient presents with left-sided facial numbness, left arm numbness and weakness the past 2 days. She is a history of hemiplegic migraines and sees Dr. Izola Price neurology at [facility]. 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  She states these symptoms are similar to her head but migraines and are associated with her typical headache. She was seen at [facility] 3 days ago and had a negative CT scan.  She returns today because she's having weakness and numbness in the left arm which is unusual for her. She also has a numbness in the left side of her face and difficulty walking and talking.  Denies any fever, chest pain, shortness of breath, nausea or vomiting. She denies any difficulty breathing or swallowing.  Results of CT myelogram 10/11:  IMPRESSION 1.  95% plus segmental stenosis of the left internal carotid artery at the bulb with a string sign configuration with delayed antegrade flow into the cranial skull base. 2.  90% stenosis at the origin of the left vertebral artery, with a 95% stenosis of the left vertebrobasilar junction at the level of the posterior arch of C1. 3.  Severe left external carotid artery stenosis proximally.   The history is provided by the patient and a relative.    Past Medical History  Diagnosis Date  . Allergy   . Anxiety   . Asthma   . GERD (gastroesophageal reflux disease)   . Hypertension   . Thyroid disease   . TIA (transient ischemic attack)   . Hearing loss   . Bruises easily   . Leg swelling     both  . Cough     Past Surgical History  Procedure Date  . Cholecystectomy   . Appendectomy   . Abdominal  hysterectomy     Family History  Problem Relation Age of Onset  . Hypertension Sister   . Cancer Paternal Grandmother     breast    History  Substance Use Topics  . Smoking status: Current Everyday Smoker -- 0.5 packs/day  . Smokeless tobacco: Not on file  . Alcohol Use: No    OB History    Grav Para Term Preterm Abortions TAB SAB Ect Mult Living                  Review of Systems  Constitutional: Negative for fever, activity change and appetite change.  HENT: Negative for congestion and rhinorrhea.   Respiratory: Negative for cough and shortness of breath.   Cardiovascular: Negative for chest pain.  Gastrointestinal: Negative for nausea, vomiting and abdominal pain.  Genitourinary: Negative for dysuria and hematuria.  Musculoskeletal: Negative for back pain.  Skin: Negative for rash.  Neurological: Positive for dizziness, speech difficulty, weakness, light-headedness, numbness and headaches.    Allergies  Codeine  Home Medications   Current Outpatient Rx  Name Route Sig Dispense Refill  . ALBUTEROL SULFATE HFA 108 (90 BASE) MCG/ACT IN AERS Inhalation Inhale 2 puffs into the lungs every 6 (six) hours as needed. For shortness of breath    . DIAZEPAM 5 MG PO TABS Oral Take 5 mg by mouth daily.     . FUROSEMIDE 40  MG PO TABS Oral Take 40 mg by mouth daily.     Marland Kitchen LEVOTHYROXINE SODIUM 88 MCG PO TABS Oral Take 88 mcg by mouth daily.    Marland Kitchen PROPRANOLOL HCL 80 MG PO CP24 Oral Take 80 mg by mouth daily.     . TRAMADOL HCL 50 MG PO TABS Oral Take 50 mg by mouth every 6 (six) hours as needed. For pain    . HYDROCODONE-ACETAMINOPHEN 5-325 MG PO TABS Oral Take 1 tablet by mouth every 6 (six) hours as needed for pain. 15 tablet 0    BP 111/75  Pulse 62  Temp(Src) 98.3 F (36.8 C) (Oral)  Resp 13  SpO2 96%  Physical Exam  Constitutional: She is oriented to person, place, and time. She appears well-developed and well-nourished. No distress.  HENT:  Head: Normocephalic and  atraumatic.  Mouth/Throat: Oropharynx is clear and moist.  Eyes: Conjunctivae are normal. Pupils are equal, round, and reactive to light.       Left eye does not track to the left past midline  Neck: Normal range of motion. Neck supple.       No meningismus  Cardiovascular: Normal rate, regular rhythm and normal heart sounds.   Pulmonary/Chest: Effort normal and breath sounds normal. No respiratory distress.  Abdominal: Soft. There is no tenderness. There is no rebound and no guarding.  Musculoskeletal: Normal range of motion. She exhibits no edema and no tenderness.  Neurological: She is alert and oriented to person, place, and time. A cranial nerve deficit is present.       Left eye does not tract past midline on the left.  No facial asymmetry, tongue midline.  No grip strength weakness.  5/5 strength throughout.  Romberg neg.  No pronator drift.  Normal gait.  Finger to nose slow and deliberate but intact.  Skin: Skin is warm.    ED Course  Procedures (including critical care time)  Labs Reviewed  BASIC METABOLIC PANEL - Abnormal; Notable for the following:    CO2 34 (*)    GFR calc non Af Amer 66 (*)    GFR calc Af Amer 77 (*)    All other components within normal limits  CBC  DIFFERENTIAL  PROTIME-INR  POCT I-STAT TROPONIN I  I-STAT TROPONIN I   Mr Shirlee Latch Wo Contrast  02/09/2011  *RADIOLOGY REPORT*  Clinical Data:  41 year old female with left-sided weakness, aphasia.  Contrast: 13mL MULTIHANCE GADOBENATE DIMEGLUMINE 529 MG/ML IV SOLN  Comparison: Head CT 08/24/2009.  MRI HEAD WITHOUT AND WITH CONTRAST  Technique: Multiplanar, multiecho pulse sequences of the brain and surrounding structures were obtained according to standard protocol without and with intravenous contrast.  Findings:  Normal cerebral volume. No restricted diffusion to suggest acute infarction.  No midline shift, mass effect, evidence of mass lesion, ventriculomegaly, extra-axial collection or acute intracranial  hemorrhage.  Cervicomedullary junction and pituitary are within normal limits.  Major intracranial vascular flow voids are preserved.  Scattered small foci of T2 and FLAIR hyperintensity in the cerebral white matter are age advanced for age.  No associated enhancement. No abnormal enhancement identified.  No cortical encephalomalacia. Normal deep gray matter nuclei, brainstem, and cerebellum. Negative visualized cervical spine.  Visualized bone marrow signal is within normal limits.  Visualized orbit soft tissues are within normal limits.  Visualized paranasal sinuses and mastoids are clear.    Negative visualized scalp soft tissues.  IMPRESSION: 1. No acute intracranial abnormality. 2.  Nonspecific cerebral white matter signal abnormality without  enhancement.  Differential considerations include accelerated/hereditary small vessel ischemia, sequelae of trauma, hypercoagulable state, vasculitis, migraines, prior infection or demyelination. 3.  MRA findings are below.  MRA NECK WITHOUT AND WITH CONTRAST  Technique:  Angiographic images of the neck were obtained using MRA technique without and with intravenous contrast.  Carotid stenosis measurements (when applicable) are obtained utilizing NASCET criteria, using the distal internal carotid diameter as the denominator.  Findings:  Precontrast time-of-flight images.  Antegrade flow in both carotid and vertebral arteries.  The left vertebral artery is dominant.  Postcontrast images.  Bovine arch configuration.  Normal great vessel origins.  Normal right common carotid artery.  Normal right carotid bifurcation.  Normal cervical right ICA.  Normal left common carotid artery.  Normal left carotid bifurcation.  Normal left cervical ICA.  Dominant left vertebral artery.  Both vertebral artery origins are normal.  No vertebral artery stenosis in the neck.  IMPRESSION: Normal neck MRA.  Dominant left vertebral artery.  MRA HEAD WITHOUT CONTRAST  Technique: Angiographic images  of the Circle of Willis were obtained using MRA technique without  intravenous contrast.  Findings:  Antegrade flow in the posterior circulation.  The nondominant right vertebral artery functionally terminates in PICA. Normal left PICA.  The left vertebral artery primarily supplies the basilar.  No basilar stenosis.  Fetal type left PCA origin. Superior cerebellar arteries and right PCA origin are within normal limits.  Right posterior communicating artery is also present. Bilateral PCA branches are within normal limits.  Antegrade flow in both ICA siphons.  Normal ophthalmic and posterior communicating artery origins.  No ICA stenosis.  Carotid termini are within normal limits.  MCA and ACA origins are within normal limits.  The left ACA is mildly dominant.  Anterior communicating artery is within normal limits.  Visualized ACA branches are within normal limits.  Visualized bilateral MCA branches are within normal limits.  IMPRESSION: Negative intracranial MRA.  Original Report Authenticated By: Harley Hallmark, M.D.   Mr Angiogram Neck W Wo Contrast  02/09/2011  *RADIOLOGY REPORT*  Clinical Data:  41 year old female with left-sided weakness, aphasia.  Contrast: 13mL MULTIHANCE GADOBENATE DIMEGLUMINE 529 MG/ML IV SOLN  Comparison: Head CT 08/24/2009.  MRI HEAD WITHOUT AND WITH CONTRAST  Technique: Multiplanar, multiecho pulse sequences of the brain and surrounding structures were obtained according to standard protocol without and with intravenous contrast.  Findings:  Normal cerebral volume. No restricted diffusion to suggest acute infarction.  No midline shift, mass effect, evidence of mass lesion, ventriculomegaly, extra-axial collection or acute intracranial hemorrhage.  Cervicomedullary junction and pituitary are within normal limits.  Major intracranial vascular flow voids are preserved.  Scattered small foci of T2 and FLAIR hyperintensity in the cerebral white matter are age advanced for age.  No  associated enhancement. No abnormal enhancement identified.  No cortical encephalomalacia. Normal deep gray matter nuclei, brainstem, and cerebellum. Negative visualized cervical spine.  Visualized bone marrow signal is within normal limits.  Visualized orbit soft tissues are within normal limits.  Visualized paranasal sinuses and mastoids are clear.    Negative visualized scalp soft tissues.  IMPRESSION: 1. No acute intracranial abnormality. 2.  Nonspecific cerebral white matter signal abnormality without enhancement.  Differential considerations include accelerated/hereditary small vessel ischemia, sequelae of trauma, hypercoagulable state, vasculitis, migraines, prior infection or demyelination. 3.  MRA findings are below.  MRA NECK WITHOUT AND WITH CONTRAST  Technique:  Angiographic images of the neck were obtained using MRA technique without and with intravenous contrast.  Carotid stenosis measurements (when applicable) are obtained utilizing NASCET criteria, using the distal internal carotid diameter as the denominator.  Findings:  Precontrast time-of-flight images.  Antegrade flow in both carotid and vertebral arteries.  The left vertebral artery is dominant.  Postcontrast images.  Bovine arch configuration.  Normal great vessel origins.  Normal right common carotid artery.  Normal right carotid bifurcation.  Normal cervical right ICA.  Normal left common carotid artery.  Normal left carotid bifurcation.  Normal left cervical ICA.  Dominant left vertebral artery.  Both vertebral artery origins are normal.  No vertebral artery stenosis in the neck.  IMPRESSION: Normal neck MRA.  Dominant left vertebral artery.  MRA HEAD WITHOUT CONTRAST  Technique: Angiographic images of the Circle of Willis were obtained using MRA technique without  intravenous contrast.  Findings:  Antegrade flow in the posterior circulation.  The nondominant right vertebral artery functionally terminates in PICA. Normal left PICA.  The left  vertebral artery primarily supplies the basilar.  No basilar stenosis.  Fetal type left PCA origin. Superior cerebellar arteries and right PCA origin are within normal limits.  Right posterior communicating artery is also present. Bilateral PCA branches are within normal limits.  Antegrade flow in both ICA siphons.  Normal ophthalmic and posterior communicating artery origins.  No ICA stenosis.  Carotid termini are within normal limits.  MCA and ACA origins are within normal limits.  The left ACA is mildly dominant.  Anterior communicating artery is within normal limits.  Visualized ACA branches are within normal limits.  Visualized bilateral MCA branches are within normal limits.  IMPRESSION: Negative intracranial MRA.  Original Report Authenticated By: Harley Hallmark, M.D.   Mr Laqueta Jean Wo Contrast  02/09/2011  *RADIOLOGY REPORT*  Clinical Data:  41 year old female with left-sided weakness, aphasia.  Contrast: 13mL MULTIHANCE GADOBENATE DIMEGLUMINE 529 MG/ML IV SOLN  Comparison: Head CT 08/24/2009.  MRI HEAD WITHOUT AND WITH CONTRAST  Technique: Multiplanar, multiecho pulse sequences of the brain and surrounding structures were obtained according to standard protocol without and with intravenous contrast.  Findings:  Normal cerebral volume. No restricted diffusion to suggest acute infarction.  No midline shift, mass effect, evidence of mass lesion, ventriculomegaly, extra-axial collection or acute intracranial hemorrhage.  Cervicomedullary junction and pituitary are within normal limits.  Major intracranial vascular flow voids are preserved.  Scattered small foci of T2 and FLAIR hyperintensity in the cerebral white matter are age advanced for age.  No associated enhancement. No abnormal enhancement identified.  No cortical encephalomalacia. Normal deep gray matter nuclei, brainstem, and cerebellum. Negative visualized cervical spine.  Visualized bone marrow signal is within normal limits.  Visualized orbit soft  tissues are within normal limits.  Visualized paranasal sinuses and mastoids are clear.    Negative visualized scalp soft tissues.  IMPRESSION: 1. No acute intracranial abnormality. 2.  Nonspecific cerebral white matter signal abnormality without enhancement.  Differential considerations include accelerated/hereditary small vessel ischemia, sequelae of trauma, hypercoagulable state, vasculitis, migraines, prior infection or demyelination. 3.  MRA findings are below.  MRA NECK WITHOUT AND WITH CONTRAST  Technique:  Angiographic images of the neck were obtained using MRA technique without and with intravenous contrast.  Carotid stenosis measurements (when applicable) are obtained utilizing NASCET criteria, using the distal internal carotid diameter as the denominator.  Findings:  Precontrast time-of-flight images.  Antegrade flow in both carotid and vertebral arteries.  The left vertebral artery is dominant.  Postcontrast images.  Bovine arch configuration.  Normal great vessel origins.  Normal right common carotid artery.  Normal right carotid bifurcation.  Normal cervical right ICA.  Normal left common carotid artery.  Normal left carotid bifurcation.  Normal left cervical ICA.  Dominant left vertebral artery.  Both vertebral artery origins are normal.  No vertebral artery stenosis in the neck.  IMPRESSION: Normal neck MRA.  Dominant left vertebral artery.  MRA HEAD WITHOUT CONTRAST  Technique: Angiographic images of the Circle of Willis were obtained using MRA technique without  intravenous contrast.  Findings:  Antegrade flow in the posterior circulation.  The nondominant right vertebral artery functionally terminates in PICA. Normal left PICA.  The left vertebral artery primarily supplies the basilar.  No basilar stenosis.  Fetal type left PCA origin. Superior cerebellar arteries and right PCA origin are within normal limits.  Right posterior communicating artery is also present. Bilateral PCA branches are within  normal limits.  Antegrade flow in both ICA siphons.  Normal ophthalmic and posterior communicating artery origins.  No ICA stenosis.  Carotid termini are within normal limits.  MCA and ACA origins are within normal limits.  The left ACA is mildly dominant.  Anterior communicating artery is within normal limits.  Visualized ACA branches are within normal limits.  Visualized bilateral MCA branches are within normal limits.  IMPRESSION: Negative intracranial MRA.  Original Report Authenticated By: Harley Hallmark, M.D.     1. Paresthesias   2. Left arm pain   3. Migraine       MDM  Left sided paresthesias, headache, similar to previous migraines.  Hx hemiplegic migraines and "TIAs".  Known left carotid stenosis. No motor weakness.  Patient is not tPA candidate given time course and minor nature of symptoms.  Decreased sensation on exam, no appreciable motor weakness.  Symptoms in LUE atypical for her hemiplegic migraines.  Plan to evaluate for CVA/TIA with MRI.  Will likely need neurology consult. D/w [facility] who will continue care in [facility].        Glynn Octave, MD 02/10/11 [phone]

## 2011-02-09 NOTE — ED Notes (Signed)
Received report from Nonda Lou, RN on pt and pt is being moved to [id number].

## 2011-02-09 NOTE — ED Notes (Signed)
Family sitting at triage, waiting to go back w/ pt

## 2011-02-09 NOTE — ED Notes (Signed)
Patient has history of headache which she said makes her have "TIAs."  Patient on thursdays was experiencing slurred speech and left side numbess and tingling.  Patient was seen at [facility] ED on Wednesday for same symptoms and CT scan was done.  Patient states that she was notified yesterday that she had 95% blockage on her left carotid

## 2011-02-09 NOTE — Consult Note (Signed)
Reason for Consult:Hemiplegic migraine Referring Physician: Rancour  CC: Left upper extremity pain  HPI: Sabrina Roberson is an 41 y.o. 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  The patient reports that she has had hemiplegic migraines since 2011.  Is followed by a neurologist.  Usually her symptoms last from a few hours to 24 hours.  She did have an occasion in 2011 when she had to be out of work for three weeks secondary to her symptoms.  Patient reports that her symptoms had not resolved on Thursday but despite blurred vision, slurred speech and left sided weakness she was able to drive to work and work for half of the day before her boss noted that she was squinting, felt she was having a headache and sent her home.  She di not work on Friday due to doctor appointments.  The patient was seen at [facility] after speaking with her neurologist and sent home with Tramadol.  It seems that the majority of her symptoms resolved but today she was still drained and had worsening left upper extremity weakness despite the Tramadol.  She presented for evaluation at that time.  Work up has included a MRI of the brain and MRA of the head and neck.  No significant findings were noted other than some changes on MRI that were consistent with migraine.    Past Medical History  Diagnosis Date  . Allergy   . Anxiety   . Asthma   . GERD (gastroesophageal reflux disease)   . Hypertension   . Thyroid disease   . TIA (transient ischemic attack)   . Hearing loss   . Bruises easily   . Leg swelling     both  . Cough     Past Surgical History  Procedure Date  . Cholecystectomy   . Appendectomy   . Abdominal hysterectomy     Family History  Problem Relation Age of Onset  . Hypertension Sister   . Cancer Paternal  Grandmother     breast    Social History:  reports that she has been smoking.  She does not have any smokeless tobacco history on file. She reports that she does not drink alcohol or use illicit drugs.  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  Allergies  Allergen Reactions  . Codeine Nausea And Vomiting    Medications: I have reviewed the patient's current medications. Prior to Admission:  Synthroid, Proventil, Valium, Lasix, Inderal, Tramadol, Amoxil  ROS: History obtained from the patient  General ROS:  fatigue Psychological ROS: negative for - behavioral disorder, hallucinations, memory difficulties, mood swings or suicidal ideation Ophthalmic ROS: blurry vision ENT ROS: negative for - epistaxis, nasal discharge, oral lesions, sore throat, tinnitus or vertigo Allergy and Immunology ROS: negative for - hives or itchy/watery eyes Hematological and Lymphatic ROS: negative for - bleeding problems, bruising or swollen lymph nodes Endocrine ROS: negative for - galactorrhea, hair pattern changes, polydipsia/polyuria or temperature intolerance Respiratory ROS: negative for - cough, hemoptysis, shortness of breath or wheezing Cardiovascular ROS: negative for - chest pain, dyspnea on exertion, edema or irregular heartbeat Gastrointestinal ROS: negative for - abdominal pain, diarrhea, hematemesis, nausea/vomiting or stool incontinence Genito-Urinary ROS: negative for - dysuria, hematuria, incontinence or urinary frequency/urgency Musculoskeletal ROS: left arm pain Neurological ROS: as noted in HPI  Dermatological ROS: negative for rash and skin lesion changes  Physical Examination: Blood pressure 111/75, pulse 62, temperature 98.3 F (36.8 C), temperature source Oral, resp. rate 13, SpO2 96.00%.  Neurologic Examination Mental Status: Alert, oriented, thought content appropriate.  Speech fluent without evidence of aphasia.  Able to follow 3 step commands without  difficulty. Cranial Nerves: II: visual fields grossly normal, pupils equal, round, reactive to light and accommodation III,IV, VI: ptosis not present, extra-ocular motions intact bilaterally V,VII: smile symmetric, facial light touch sensation decreased on the left side of the face, splitting the midline VIII: hearing normal bilaterally IX,X: gag reflex present XI: trapezius strength/neck flexion strength normal bilaterally XII: tongue strength normal  Motor: Right : Upper extremity    Left:     Upper extremity 5/5 deltoid       5/5 deltoid 5/5 biceps      5/5 biceps  5/5 triceps      5/5 triceps 5/5wrist flexion     5/5 wrist flexion 5/5 wrist extension     5/5 wrist extension 5/5 hand grip      5/5 hand grip  Lower extremity     Lower extremity 5/5 hip flexor      5/5 hip flexor 5/5 hip adductors     5/5 hip adductors 5/5 hip abductors     5/5 hip abductors 5/5 quadricep      5/5 quadriceps  5/5 hamstrings     5/5 hamstrings 5/5 plantar flexion       5/5 plantar flexion 5/5 plantar extension     5/5 plantar extension Tone and bulk:normal tone throughout; no atrophy noted Sensory: Pinprick and light touch decreased on the left, splitting the midline.  Also with further decreased pinprick and light touch along the medial aspect of the left arm below the elbow and into the last two digits.  No ulnar groove tenderness noted.   Deep Tendon Reflexes: 2+ and symmetric throughout Plantars: Right: downgoing   Left: downgoing Cerebellar: normal finger-to-nose and normal heel-to-shin test with patient using left hand as if no strength at the wrist when able to give full strength at the wrist in formal testing.          Results for orders placed during the hospital encounter of 02/09/11 (from the past 48 hour(s))  CBC     Status: Normal   Collection Time   02/09/11  5:13 PM      Component Value Range Comment   WBC 6.6  4.0 - 10.5 (K/uL)    RBC 3.91  3.87 - 5.11 (MIL/uL)     Hemoglobin 12.7  12.0 - 15.0 (g/dL)    HCT 16.1  09.6 - 04.5 (%)    MCV 98.0  78.0 - 100.0 (fL)    MCH 32.5  26.0 - 34.0 (pg)    MCHC 33.2  30.0 - 36.0 (g/dL)    RDW 40.9  81.1 - 91.4 (%)    Platelets 226  150 - 400 (K/uL)   DIFFERENTIAL     Status: Normal   Collection Time   02/09/11  5:13 PM      Component Value Range Comment   Neutrophils Relative 56  43 - 77 (%)    Neutro Abs 3.7  1.7 - 7.7 (K/uL)    Lymphocytes Relative 36  12 - 46 (%)    Lymphs Abs 2.4  0.7 - 4.0 (K/uL)    Monocytes Relative 5  3 - 12 (%)    Monocytes Absolute  0.3  0.1 - 1.0 (K/uL)    Eosinophils Relative 3  0 - 5 (%)    Eosinophils Absolute 0.2  0.0 - 0.7 (K/uL)    Basophils Relative 0  0 - 1 (%)    Basophils Absolute 0.0  0.0 - 0.1 (K/uL)   BASIC METABOLIC PANEL     Status: Abnormal   Collection Time   02/09/11  5:13 PM      Component Value Range Comment   Sodium 141  135 - 145 (mEq/L)    Potassium 4.0  3.5 - 5.1 (mEq/L)    Chloride 102  96 - 112 (mEq/L)    CO2 34 (*) 19 - 32 (mEq/L)    Glucose, Bld 88  70 - 99 (mg/dL)    BUN 17  6 - 23 (mg/dL)    Creatinine, Ser 1.61  0.50 - 1.10 (mg/dL)    Calcium 8.9  8.4 - 10.5 (mg/dL)    GFR calc non Af Amer 66 (*) >90 (mL/min)    GFR calc Af Amer 77 (*) >90 (mL/min)   PROTIME-INR     Status: Normal   Collection Time   02/09/11  5:13 PM      Component Value Range Comment   Prothrombin Time 12.8  11.6 - 15.2 (seconds)    INR 0.94  0.00 - 1.49    POCT I-STAT TROPONIN I     Status: Normal   Collection Time   02/09/11  5:28 PM      Component Value Range Comment   Troponin i, poc 0.01  0.00 - 0.08 (ng/mL)    Comment 3              No results found for this or any previous visit (from the past 240 hour(s)).  Mr Shirlee Latch Wo Contrast  02/09/2011  *RADIOLOGY REPORT*  Clinical Data:  41 year old female with left-sided weakness, aphasia.  Contrast: 13mL MULTIHANCE GADOBENATE DIMEGLUMINE 529 MG/ML IV SOLN  Comparison: Head CT 08/24/2009.  MRI HEAD WITHOUT AND WITH  CONTRAST  Technique: Multiplanar, multiecho pulse sequences of the brain and surrounding structures were obtained according to standard protocol without and with intravenous contrast.  Findings:  Normal cerebral volume. No restricted diffusion to suggest acute infarction.  No midline shift, mass effect, evidence of mass lesion, ventriculomegaly, extra-axial collection or acute intracranial hemorrhage.  Cervicomedullary junction and pituitary are within normal limits.  Major intracranial vascular flow voids are preserved.  Scattered small foci of T2 and FLAIR hyperintensity in the cerebral white matter are age advanced for age.  No associated enhancement. No abnormal enhancement identified.  No cortical encephalomalacia. Normal deep gray matter nuclei, brainstem, and cerebellum. Negative visualized cervical spine.  Visualized bone marrow signal is within normal limits.  Visualized orbit soft tissues are within normal limits.  Visualized paranasal sinuses and mastoids are clear.    Negative visualized scalp soft tissues.  IMPRESSION: 1. No acute intracranial abnormality. 2.  Nonspecific cerebral white matter signal abnormality without enhancement.  Differential considerations include accelerated/hereditary small vessel ischemia, sequelae of trauma, hypercoagulable state, vasculitis, migraines, prior infection or demyelination. 3.  MRA findings are below.  MRA NECK WITHOUT AND WITH CONTRAST  Technique:  Angiographic images of the neck were obtained using MRA technique without and with intravenous contrast.  Carotid stenosis measurements (when applicable) are obtained utilizing NASCET criteria, using the distal internal carotid diameter as the denominator.  Findings:  Precontrast time-of-flight images.  Antegrade flow in both carotid and vertebral arteries.  The left vertebral artery is dominant.  Postcontrast images.  Bovine arch configuration.  Normal great vessel origins.  Normal right common carotid artery.  Normal  right carotid bifurcation.  Normal cervical right ICA.  Normal left common carotid artery.  Normal left carotid bifurcation.  Normal left cervical ICA.  Dominant left vertebral artery.  Both vertebral artery origins are normal.  No vertebral artery stenosis in the neck.  IMPRESSION: Normal neck MRA.  Dominant left vertebral artery.  MRA HEAD WITHOUT CONTRAST  Technique: Angiographic images of the Circle of Willis were obtained using MRA technique without  intravenous contrast.  Findings:  Antegrade flow in the posterior circulation.  The nondominant right vertebral artery functionally terminates in PICA. Normal left PICA.  The left vertebral artery primarily supplies the basilar.  No basilar stenosis.  Fetal type left PCA origin. Superior cerebellar arteries and right PCA origin are within normal limits.  Right posterior communicating artery is also present. Bilateral PCA branches are within normal limits.  Antegrade flow in both ICA siphons.  Normal ophthalmic and posterior communicating artery origins.  No ICA stenosis.  Carotid termini are within normal limits.  MCA and ACA origins are within normal limits.  The left ACA is mildly dominant.  Anterior communicating artery is within normal limits.  Visualized ACA branches are within normal limits.  Visualized bilateral MCA branches are within normal limits.  IMPRESSION: Negative intracranial MRA.  Original Report Authenticated By: Harley Hallmark, M.D.   Mr Angiogram Neck W Wo Contrast  02/09/2011  *RADIOLOGY REPORT*  Clinical Data:  41 year old female with left-sided weakness, aphasia.  Contrast: 13mL MULTIHANCE GADOBENATE DIMEGLUMINE 529 MG/ML IV SOLN  Comparison: Head CT 08/24/2009.  MRI HEAD WITHOUT AND WITH CONTRAST  Technique: Multiplanar, multiecho pulse sequences of the brain and surrounding structures were obtained according to standard protocol without and with intravenous contrast.  Findings:  Normal cerebral volume. No restricted diffusion to suggest  acute infarction.  No midline shift, mass effect, evidence of mass lesion, ventriculomegaly, extra-axial collection or acute intracranial hemorrhage.  Cervicomedullary junction and pituitary are within normal limits.  Major intracranial vascular flow voids are preserved.  Scattered small foci of T2 and FLAIR hyperintensity in the cerebral white matter are age advanced for age.  No associated enhancement. No abnormal enhancement identified.  No cortical encephalomalacia. Normal deep gray matter nuclei, brainstem, and cerebellum. Negative visualized cervical spine.  Visualized bone marrow signal is within normal limits.  Visualized orbit soft tissues are within normal limits.  Visualized paranasal sinuses and mastoids are clear.    Negative visualized scalp soft tissues.  IMPRESSION: 1. No acute intracranial abnormality. 2.  Nonspecific cerebral white matter signal abnormality without enhancement.  Differential considerations include accelerated/hereditary small vessel ischemia, sequelae of trauma, hypercoagulable state, vasculitis, migraines, prior infection or demyelination. 3.  MRA findings are below.  MRA NECK WITHOUT AND WITH CONTRAST  Technique:  Angiographic images of the neck were obtained using MRA technique without and with intravenous contrast.  Carotid stenosis measurements (when applicable) are obtained utilizing NASCET criteria, using the distal internal carotid diameter as the denominator.  Findings:  Precontrast time-of-flight images.  Antegrade flow in both carotid and vertebral arteries.  The left vertebral artery is dominant.  Postcontrast images.  Bovine arch configuration.  Normal great vessel origins.  Normal right common carotid artery.  Normal right carotid bifurcation.  Normal cervical right ICA.  Normal left common carotid artery.  Normal left carotid bifurcation.  Normal left cervical ICA.  Dominant  left vertebral artery.  Both vertebral artery origins are normal.  No vertebral artery  stenosis in the neck.  IMPRESSION: Normal neck MRA.  Dominant left vertebral artery.  MRA HEAD WITHOUT CONTRAST  Technique: Angiographic images of the Circle of Willis were obtained using MRA technique without  intravenous contrast.  Findings:  Antegrade flow in the posterior circulation.  The nondominant right vertebral artery functionally terminates in PICA. Normal left PICA.  The left vertebral artery primarily supplies the basilar.  No basilar stenosis.  Fetal type left PCA origin. Superior cerebellar arteries and right PCA origin are within normal limits.  Right posterior communicating artery is also present. Bilateral PCA branches are within normal limits.  Antegrade flow in both ICA siphons.  Normal ophthalmic and posterior communicating artery origins.  No ICA stenosis.  Carotid termini are within normal limits.  MCA and ACA origins are within normal limits.  The left ACA is mildly dominant.  Anterior communicating artery is within normal limits.  Visualized ACA branches are within normal limits.  Visualized bilateral MCA branches are within normal limits.  IMPRESSION: Negative intracranial MRA.  Original Report Authenticated By: Harley Hallmark, M.D.   Mr Laqueta Jean Wo Contrast  02/09/2011  *RADIOLOGY REPORT*  Clinical Data:  41 year old female with left-sided weakness, aphasia.  Contrast: 13mL MULTIHANCE GADOBENATE DIMEGLUMINE 529 MG/ML IV SOLN  Comparison: Head CT 08/24/2009.  MRI HEAD WITHOUT AND WITH CONTRAST  Technique: Multiplanar, multiecho pulse sequences of the brain and surrounding structures were obtained according to standard protocol without and with intravenous contrast.  Findings:  Normal cerebral volume. No restricted diffusion to suggest acute infarction.  No midline shift, mass effect, evidence of mass lesion, ventriculomegaly, extra-axial collection or acute intracranial hemorrhage.  Cervicomedullary junction and pituitary are within normal limits.  Major intracranial vascular flow voids  are preserved.  Scattered small foci of T2 and FLAIR hyperintensity in the cerebral white matter are age advanced for age.  No associated enhancement. No abnormal enhancement identified.  No cortical encephalomalacia. Normal deep gray matter nuclei, brainstem, and cerebellum. Negative visualized cervical spine.  Visualized bone marrow signal is within normal limits.  Visualized orbit soft tissues are within normal limits.  Visualized paranasal sinuses and mastoids are clear.    Negative visualized scalp soft tissues.  IMPRESSION: 1. No acute intracranial abnormality. 2.  Nonspecific cerebral white matter signal abnormality without enhancement.  Differential considerations include accelerated/hereditary small vessel ischemia, sequelae of trauma, hypercoagulable state, vasculitis, migraines, prior infection or demyelination. 3.  MRA findings are below.  MRA NECK WITHOUT AND WITH CONTRAST  Technique:  Angiographic images of the neck were obtained using MRA technique without and with intravenous contrast.  Carotid stenosis measurements (when applicable) are obtained utilizing NASCET criteria, using the distal internal carotid diameter as the denominator.  Findings:  Precontrast time-of-flight images.  Antegrade flow in both carotid and vertebral arteries.  The left vertebral artery is dominant.  Postcontrast images.  Bovine arch configuration.  Normal great vessel origins.  Normal right common carotid artery.  Normal right carotid bifurcation.  Normal cervical right ICA.  Normal left common carotid artery.  Normal left carotid bifurcation.  Normal left cervical ICA.  Dominant left vertebral artery.  Both vertebral artery origins are normal.  No vertebral artery stenosis in the neck.  IMPRESSION: Normal neck MRA.  Dominant left vertebral artery.  MRA HEAD WITHOUT CONTRAST  Technique: Angiographic images of the Circle of Willis were obtained using MRA technique without  intravenous contrast.  Findings:  Antegrade flow in  the posterior circulation.  The nondominant right vertebral artery functionally terminates in PICA. Normal left PICA.  The left vertebral artery primarily supplies the basilar.  No basilar stenosis.  Fetal type left PCA origin. Superior cerebellar arteries and right PCA origin are within normal limits.  Right posterior communicating artery is also present. Bilateral PCA branches are within normal limits.  Antegrade flow in both ICA siphons.  Normal ophthalmic and posterior communicating artery origins.  No ICA stenosis.  Carotid termini are within normal limits.  MCA and ACA origins are within normal limits.  The left ACA is mildly dominant.  Anterior communicating artery is within normal limits.  Visualized ACA branches are within normal limits.  Visualized bilateral MCA branches are within normal limits.  IMPRESSION: Negative intracranial MRA.  Original Report Authenticated By: Harley Hallmark, M.D.     Assessment/Plan:  Patient Active Hospital Problem List: Left arm pain   Assessment: The patient's most significant complaint and that for which she presented this evening is that of left arm pain.  There are some sensory findings that may suggest a lower cervical etiology and with review of past imaging the patient has a lower cervical disc at C7.  Current pain regimen is not sufficient.   Plan: 1. D/C Ultram  2. Would consider something else for pain like Vicodin with enough to hold her until her doctors appointment on Monday.   Left sided numbness and weakness   Assessment:  Patient's symptoms seem to have resolved for the most part with no weakness or slurred speech noted on exam at this time.  Patient reports her symptoms are usually right sided.  Exam has quite a few functional features tonight.  Imaging shows no evidence of an acute event.  There is a report in her old record showing a catheter angiothat was done in 2011showing left ICA stenosis of 95%. There was also significant vertebral stenosis  as well.  The patient nor the patient's mother can recall a catheter angio being done and report that she has never been given such results.  I also can not find imaging to coincide with this procedure.  MRA tonight shows no significant stenosis and I am concerned that possibly information has been erroneously uploaded into her chart.  Current symptoms are not referable to this proposed lesion.     Plan: 1.  No further work up at this time.  Patient to follow up with her outpatient neurologist on Monday.    2.  Agree with discharge home.  Thana Farr, MD [facility] [phone] 02/09/2011, 11:55 PM

## 2011-02-09 NOTE — ED Notes (Signed)
Patient transported to MRI. Pt remains at MRI

## 2011-02-09 NOTE — ED Provider Notes (Signed)
10:01 PM Patient is in CDU holding for MR Brain, MRA head.  This is a shared visit with Dr Manus Gunning.  Patient has a hx of hemiplegic migraines and TIAs.  Wednesday developed left sided weakness and numbness without a migraine.  Was seen at [facility] ED with negative head CT.  Symptoms persist today, presented to our ED. On exam, pt is A&Ox4, NAD, RRR, no m/r/g, CTAB, abd soft, NT, extremities without edema, distal pulses intact and equal bilaterally.  CN II-XII roughly intact with exception of decreased sensation on left face.  Patient has decreased sensation throughout left side.  Weakness of left grip compared to right and also of triceps left compared to right.  Biceps strengths seem equal.  No pronator drift, finger to nose is slow but accurate, rapid alternating movements and heel to shin are normal.   Plan is for MR and neurological consult. I have discussed MR results with patient and family member.  I have called Dr Thad Ranger, who will see the patient in the ED.   11:52 PM Patient has been seen in the ED by Dr Thad Ranger who recommends discharge home with additional pain medication, and follow up with her neurologist on Monday.  Please see Dr Ferne Coe consult note for details.  Patient verbalizes understanding and agrees with plan.   Rise Patience, [place] 02/10/11 1528

## 2011-02-10 MED ORDER — HYDROCODONE-ACETAMINOPHEN 5-325 MG PO TABS: 1.0000 | ORAL_TABLET | Freq: Four times a day (QID) | ORAL | Status: AC | PRN

## 2011-02-10 NOTE — ED Provider Notes (Signed)
Medical screening examination/treatment/procedure(s) were performed by non-physician practitioner and as supervising physician I was immediately available for consultation/collaboration.   Krina Mraz, MD 02/10/11 2359 

## 2011-02-10 NOTE — ED Notes (Signed)
Pt NAD, resp e/u, AOx4. Pt states understanding of discharge instructions and denies questions at discharge.

## 2011-02-11 DIAGNOSIS — R110 Nausea: Secondary | ICD-10-CM | POA: Insufficient documentation

## 2011-02-11 DIAGNOSIS — Z5181 Encounter for therapeutic drug level monitoring: Secondary | ICD-10-CM | POA: Insufficient documentation

## 2011-02-11 DIAGNOSIS — M797 Fibromyalgia: Secondary | ICD-10-CM | POA: Insufficient documentation

## 2011-02-11 DIAGNOSIS — R109 Unspecified abdominal pain: Secondary | ICD-10-CM

## 2011-02-11 HISTORY — DX: Nausea: R11.0

## 2011-02-11 HISTORY — DX: Encounter for therapeutic drug level monitoring: Z51.81

## 2011-02-11 HISTORY — DX: Unspecified abdominal pain: R10.9

## 2011-02-13 ENCOUNTER — Other Ambulatory Visit (HOSPITAL_COMMUNITY): Payer: Self-pay | Admitting: Neurosurgery

## 2011-02-13 DIAGNOSIS — M5410 Radiculopathy, site unspecified: Secondary | ICD-10-CM

## 2011-02-13 DIAGNOSIS — M542 Cervicalgia: Secondary | ICD-10-CM

## 2011-02-19 ENCOUNTER — Inpatient Hospital Stay (HOSPITAL_COMMUNITY)
Admission: RE | Admit: 2011-02-19 | Discharge: 2011-02-19 | Payer: 59 | Source: Ambulatory Visit | Attending: Neurosurgery | Admitting: Neurosurgery

## 2011-02-25 ENCOUNTER — Inpatient Hospital Stay (HOSPITAL_COMMUNITY): Admission: RE | Admit: 2011-02-25 | Payer: 59 | Source: Ambulatory Visit

## 2011-03-11 ENCOUNTER — Other Ambulatory Visit: Payer: Self-pay | Admitting: *Deleted

## 2011-03-11 ENCOUNTER — Other Ambulatory Visit (HOSPITAL_COMMUNITY): Payer: Self-pay | Admitting: Neurosurgery

## 2011-03-11 DIAGNOSIS — M4310 Spondylolisthesis, site unspecified: Secondary | ICD-10-CM

## 2011-03-11 DIAGNOSIS — M542 Cervicalgia: Secondary | ICD-10-CM

## 2011-03-11 DIAGNOSIS — M5106 Intervertebral disc disorders with myelopathy, lumbar region: Secondary | ICD-10-CM

## 2011-03-11 DIAGNOSIS — M5412 Radiculopathy, cervical region: Secondary | ICD-10-CM

## 2011-03-14 ENCOUNTER — Other Ambulatory Visit (HOSPITAL_COMMUNITY): Payer: 59

## 2011-03-20 ENCOUNTER — Ambulatory Visit (HOSPITAL_COMMUNITY)
Admission: RE | Admit: 2011-03-20 | Discharge: 2011-03-20 | Disposition: A | Discharge status: Home / Self Care | Payer: 59 | Source: Ambulatory Visit | Attending: Neurosurgery | Admitting: Neurosurgery

## 2011-03-20 DIAGNOSIS — M79609 Pain in unspecified limb: Secondary | ICD-10-CM | POA: Insufficient documentation

## 2011-03-20 DIAGNOSIS — M5126 Other intervertebral disc displacement, lumbar region: Secondary | ICD-10-CM | POA: Insufficient documentation

## 2011-03-20 DIAGNOSIS — R209 Unspecified disturbances of skin sensation: Secondary | ICD-10-CM | POA: Insufficient documentation

## 2011-03-20 DIAGNOSIS — M5412 Radiculopathy, cervical region: Secondary | ICD-10-CM

## 2011-03-20 DIAGNOSIS — M5106 Intervertebral disc disorders with myelopathy, lumbar region: Secondary | ICD-10-CM | POA: Insufficient documentation

## 2011-03-20 DIAGNOSIS — M549 Dorsalgia, unspecified: Secondary | ICD-10-CM | POA: Insufficient documentation

## 2011-03-20 DIAGNOSIS — M542 Cervicalgia: Secondary | ICD-10-CM

## 2011-03-20 DIAGNOSIS — M5124 Other intervertebral disc displacement, thoracic region: Secondary | ICD-10-CM | POA: Insufficient documentation

## 2011-03-20 DIAGNOSIS — M48061 Spinal stenosis, lumbar region without neurogenic claudication: Secondary | ICD-10-CM | POA: Insufficient documentation

## 2011-03-20 DIAGNOSIS — M5020 Other cervical disc displacement, unspecified cervical region: Secondary | ICD-10-CM | POA: Insufficient documentation

## 2011-03-20 DIAGNOSIS — M4310 Spondylolisthesis, site unspecified: Secondary | ICD-10-CM

## 2011-05-29 DIAGNOSIS — N183 Chronic kidney disease, stage 3 unspecified: Secondary | ICD-10-CM | POA: Insufficient documentation

## 2011-07-25 IMAGING — NM NM RAI THERAPY FOR HYPERTHYROIDISM
1 series · 1 of 1 positions shown · non-contrast
Comparison: Thyroid scan and uptake 10/05/2008.

CLINICAL DATA: Hyperthyroidism.

NUCLEAR MEDICINE RADIOACTIVE IODINE THERAPY FOR HYPERTHYROIDISM
TECHNIQUE: The risks and benefits of radioactive iodine therapy
were discussed with the patient in detail. Alternative therapies
were also mentioned. Radiation safety was discussed with the
patient, including how to protect the general public from exposure.
There were no barriers to communication.  Written consent was
obtained.  The patient then received a capsule containing the
radiopharmaceutical.  The patient will follow-up with the referring
physician.
Radiopharmaceutical: 29.4 mCi 8-TXT.

[hyperthyroid · 1 of 1 slices shown]
[im 1/1]
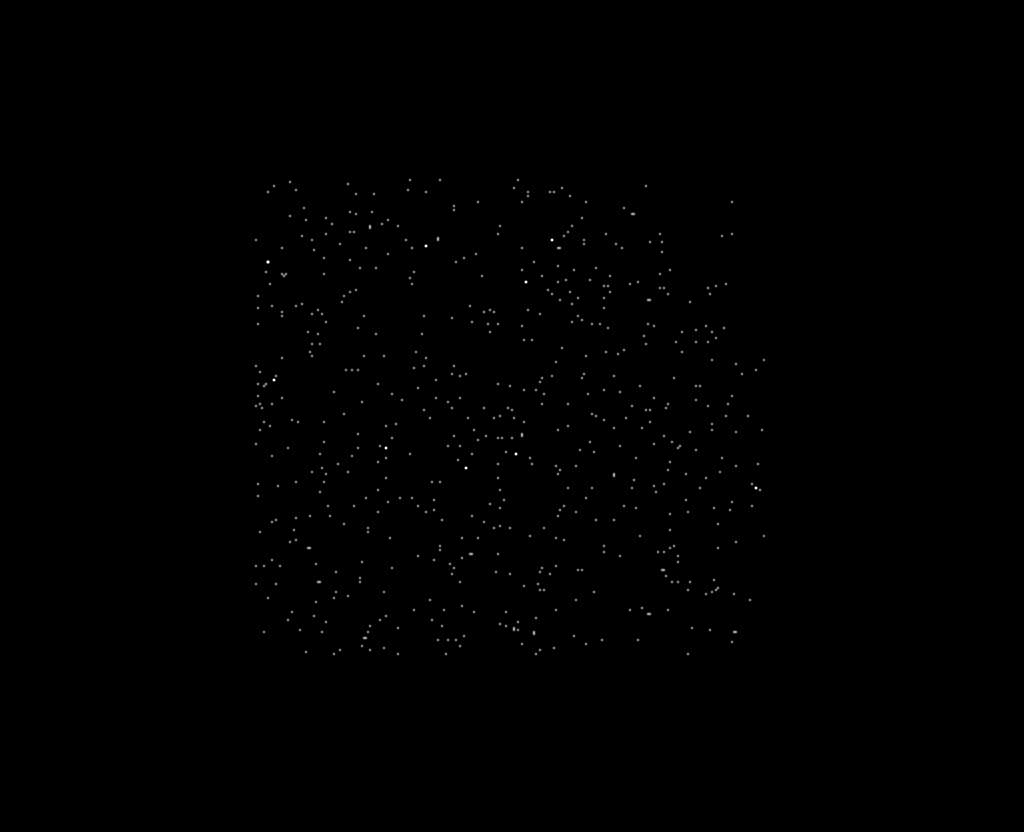

[1 of 1 positions shown; findings below may reference images not displayed]

FINDINGS: Treatment of hyperthyroidism with 29.4 mCi of 8-TXT.
Appropriate precautions and instructions were reviewed with the
patient.
IMPRESSION: Treatment of hyperthyroidism with 29.4 mCi of 8-TXT.

## 2011-12-11 ENCOUNTER — Ambulatory Visit: Payer: 59 | Attending: Obstetrics and Gynecology | Admitting: Physical Therapy

## 2011-12-11 DIAGNOSIS — M629 Disorder of muscle, unspecified: Secondary | ICD-10-CM | POA: Insufficient documentation

## 2011-12-11 DIAGNOSIS — M2420 Disorder of ligament, unspecified site: Secondary | ICD-10-CM | POA: Insufficient documentation

## 2011-12-11 DIAGNOSIS — IMO0001 Reserved for inherently not codable concepts without codable children: Secondary | ICD-10-CM | POA: Insufficient documentation

## 2011-12-19 ENCOUNTER — Ambulatory Visit: Payer: 59 | Admitting: Physical Therapy

## 2011-12-23 ENCOUNTER — Ambulatory Visit: Payer: 59 | Admitting: Physical Therapy

## 2011-12-23 DIAGNOSIS — M5137 Other intervertebral disc degeneration, lumbosacral region: Secondary | ICD-10-CM | POA: Insufficient documentation

## 2011-12-23 DIAGNOSIS — M5030 Other cervical disc degeneration, unspecified cervical region: Secondary | ICD-10-CM | POA: Insufficient documentation

## 2011-12-23 DIAGNOSIS — M25512 Pain in left shoulder: Secondary | ICD-10-CM | POA: Insufficient documentation

## 2011-12-23 DIAGNOSIS — M4306 Spondylolysis, lumbar region: Secondary | ICD-10-CM | POA: Insufficient documentation

## 2011-12-23 DIAGNOSIS — F1320 Sedative, hypnotic or anxiolytic dependence, uncomplicated: Secondary | ICD-10-CM | POA: Insufficient documentation

## 2011-12-24 ENCOUNTER — Ambulatory Visit: Payer: 59 | Admitting: Physical Therapy

## 2011-12-30 ENCOUNTER — Encounter: Payer: 59 | Admitting: Physical Therapy

## 2012-01-01 ENCOUNTER — Encounter: Payer: 59 | Admitting: Physical Therapy

## 2012-01-03 ENCOUNTER — Other Ambulatory Visit: Payer: Self-pay | Admitting: Obstetrics and Gynecology

## 2012-01-06 ENCOUNTER — Encounter: Payer: 59 | Admitting: Physical Therapy

## 2012-01-08 ENCOUNTER — Encounter: Payer: 59 | Admitting: Physical Therapy

## 2012-01-20 ENCOUNTER — Encounter: Payer: 59 | Admitting: Physical Therapy

## 2012-01-23 ENCOUNTER — Encounter: Payer: 59 | Admitting: Physical Therapy

## 2012-01-27 ENCOUNTER — Encounter: Payer: 59 | Admitting: Physical Therapy
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
## 2012-06-15 ENCOUNTER — Other Ambulatory Visit: Payer: Self-pay | Admitting: *Deleted

## 2012-06-15 DIAGNOSIS — G459 Transient cerebral ischemic attack, unspecified: Secondary | ICD-10-CM

## 2012-06-18 ENCOUNTER — Ambulatory Visit (HOSPITAL_COMMUNITY)
Admission: RE | Admit: 2012-06-18 | Discharge: 2012-06-18 | Disposition: A | Discharge status: Home / Self Care | Payer: 59 | Source: Ambulatory Visit | Attending: Cardiovascular Disease | Admitting: Cardiovascular Disease

## 2012-06-18 ENCOUNTER — Encounter (HOSPITAL_COMMUNITY)
Admission: RE | Disposition: A | Discharge status: Home / Self Care | Payer: Self-pay | Source: Ambulatory Visit | Attending: Cardiovascular Disease

## 2012-06-18 DIAGNOSIS — G43409 Hemiplegic migraine, not intractable, without status migrainosus: Secondary | ICD-10-CM | POA: Insufficient documentation

## 2012-06-18 DIAGNOSIS — E079 Disorder of thyroid, unspecified: Secondary | ICD-10-CM | POA: Insufficient documentation

## 2012-06-18 DIAGNOSIS — Z7982 Long term (current) use of aspirin: Secondary | ICD-10-CM | POA: Insufficient documentation

## 2012-06-18 DIAGNOSIS — J45909 Unspecified asthma, uncomplicated: Secondary | ICD-10-CM | POA: Insufficient documentation

## 2012-06-18 DIAGNOSIS — Z8673 Personal history of transient ischemic attack (TIA), and cerebral infarction without residual deficits: Secondary | ICD-10-CM | POA: Insufficient documentation

## 2012-06-18 DIAGNOSIS — Z79899 Other long term (current) drug therapy: Secondary | ICD-10-CM | POA: Insufficient documentation

## 2012-06-18 DIAGNOSIS — Z885 Allergy status to narcotic agent status: Secondary | ICD-10-CM | POA: Insufficient documentation

## 2012-06-18 DIAGNOSIS — I10 Essential (primary) hypertension: Secondary | ICD-10-CM | POA: Insufficient documentation

## 2012-06-18 DIAGNOSIS — G459 Transient cerebral ischemic attack, unspecified: Secondary | ICD-10-CM

## 2012-06-18 DIAGNOSIS — K219 Gastro-esophageal reflux disease without esophagitis: Secondary | ICD-10-CM | POA: Insufficient documentation

## 2012-06-18 DIAGNOSIS — IMO0001 Reserved for inherently not codable concepts without codable children: Secondary | ICD-10-CM | POA: Insufficient documentation

## 2012-06-18 DIAGNOSIS — H9190 Unspecified hearing loss, unspecified ear: Secondary | ICD-10-CM | POA: Insufficient documentation

## 2012-06-18 HISTORY — PX: TEE WITHOUT CARDIOVERSION: SHX5443

## 2012-06-18 SURGERY — ECHOCARDIOGRAM, TRANSESOPHAGEAL
Anesthesia: Moderate Sedation

## 2012-06-18 MED ORDER — FENTANYL CITRATE 0.05 MG/ML IJ SOLN
INTRAMUSCULAR | Status: AC
  Filled 2012-06-18: qty 2

## 2012-06-18 MED ORDER — BUTAMBEN-TETRACAINE-BENZOCAINE 2-2-14 % EX AERO
INHALATION_SPRAY | CUTANEOUS | Status: DC | PRN
  Administered 2012-06-18: 2 via TOPICAL

## 2012-06-18 MED ORDER — MIDAZOLAM HCL 10 MG/2ML IJ SOLN
INTRAMUSCULAR | Status: DC | PRN
  Administered 2012-06-18 (×2): 1 mg via INTRAVENOUS
  Administered 2012-06-18 (×2): 2 mg via INTRAVENOUS

## 2012-06-18 MED ORDER — FENTANYL CITRATE 0.05 MG/ML IJ SOLN
INTRAMUSCULAR | Status: DC | PRN
  Administered 2012-06-18 (×3): 25 ug via INTRAVENOUS

## 2012-06-18 MED ORDER — MIDAZOLAM HCL 5 MG/ML IJ SOLN
INTRAMUSCULAR | Status: AC
  Filled 2012-06-18: qty 2

## 2012-06-18 MED ORDER — SODIUM CHLORIDE 0.9 % IV SOLN
INTRAVENOUS | Status: DC
  Administered 2012-06-18: 500 mL via INTRAVENOUS

## 2012-06-18 NOTE — Progress Notes (Signed)
  Echocardiogram Echocardiogram Transesophageal has been performed.  Sabrina Roberson 06/18/2012, 3:33 PM

## 2012-06-18 NOTE — H&P (Signed)
  Please refer to full H/P per Wilburt Finlay, PA. Young woman with suspected TIA here for TEE. This procedure has been fully reviewed with the patient and written informed consent has been obtained.  Thurmon Fair, MD, [facility] [phone] office [phone] pager

## 2012-06-18 NOTE — CV Procedure (Signed)
INDICATIONS: TIA  PROCEDURE:   Informed consent was obtained prior to the procedure. The risks, benefits and alternatives for the procedure were discussed and the patient comprehended these risks.  Risks include, but are not limited to, cough, sore throat, vomiting, nausea, somnolence, esophageal and stomach trauma or perforation, bleeding, low blood pressure, aspiration, pneumonia, infection, trauma to the teeth and death.    After a procedural time-out, the oropharynx was anesthetized with 20% benzocaine spray. The patient was given 6 mg versed and 75 mcg fentanyl for moderate sedation.   The transesophageal probe was inserted in the esophagus and stomach without difficulty and multiple views were obtained.  The patient was kept under observation until the patient left the procedure room.  The patient left the procedure room in stable condition.   Agitated microbubble saline contrast was administered.  COMPLICATIONS:    There were no immediate complications.  FINDINGS:  Normal exam. Negative shunt study. No cardioembolic source.  RECOMMENDATIONS:   Follow up with Arnette Felts, PA & Adair Laundry, MD  Time Spent Directly with the Patient:  30 minutes   Ferry Matthis 06/18/2012, 2:49 PM

## 2012-06-18 NOTE — H&P (Addendum)
Sabrina Roberson is an 42 y.o. female.   Chief Complaint:  TIAs HPI: The patient is a 42 yo female with a history of TIAs, hemiplegic migraines, HTN, GERD, asthma, thyroid disease, fibromyalgia.  She has multiple complaints including nausea, CP(intermittently), diaphoresis, Abd pain, dizziness, weakness.  She presents today for transesophageal echocardiogram.  Medications: Prior to Admission medications   Medication Sig Start Date End Date Taking? Authorizing Provider  albuterol (PROVENTIL HFA;VENTOLIN HFA) 108 (90 BASE) MCG/ACT inhaler Inhale 2 puffs into the lungs every 6 (six) hours as needed. For shortness of breath   Yes Historical Provider, MD  buPROPion (WELLBUTRIN XL) 150 MG 24 hr tablet Take 150 mg by mouth daily.   Yes Historical Provider, MD  diazepam (VALIUM) 5 MG tablet Take 5 mg by mouth daily.    Yes Historical Provider, MD  dipyridamole-aspirin (AGGRENOX) 200-25 MG per 12 hr capsule Take 1 capsule by mouth daily.   Yes Historical Provider, MD  levothyroxine (SYNTHROID, LEVOTHROID) 88 MCG tablet Take 88 mcg by mouth daily.   Yes Historical Provider, MD  Promethazine HCl (PHENERGAN PO) Take 25 mg by mouth every 6 (six) hours as needed.   Yes Historical Provider, MD  propranolol (INDERAL LA) 80 MG 24 hr capsule Take 80 mg by mouth daily.    Yes Historical Provider, MD  furosemide (LASIX) 40 MG tablet Take 40 mg by mouth daily.     Historical Provider, MD  traMADol (ULTRAM) 50 MG tablet Take 50 mg by mouth every 6 (six) hours as needed. For pain    Historical Provider, MD     Past Medical History  Diagnosis Date  . Allergy   . Anxiety   . Asthma   . GERD (gastroesophageal reflux disease)   . Hypertension   . Thyroid disease   . TIA (transient ischemic attack)   . Hearing loss   . Bruises easily   . Leg swelling     both  . Cough     Past Surgical History  Procedure Laterality Date  . Cholecystectomy    . Appendectomy    . Abdominal hysterectomy      Family History   Problem Relation Age of Onset  . Hypertension Sister   . Cancer Paternal Grandmother     breast   Social History:  reports that she has been smoking.  She does not have any smokeless tobacco history on file. She reports that she does not drink alcohol or use illicit drugs.  Allergies:  Allergies  Allergen Reactions  . Codeine Nausea And Vomiting    Medications Prior to Admission  Medication Sig Dispense Refill  . albuterol (PROVENTIL HFA;VENTOLIN HFA) 108 (90 BASE) MCG/ACT inhaler Inhale 2 puffs into the lungs every 6 (six) hours as needed. For shortness of breath      . buPROPion (WELLBUTRIN XL) 150 MG 24 hr tablet Take 150 mg by mouth daily.      . diazepam (VALIUM) 5 MG tablet Take 5 mg by mouth daily.       Marland Kitchen dipyridamole-aspirin (AGGRENOX) 200-25 MG per 12 hr capsule Take 1 capsule by mouth daily.      Marland Kitchen levothyroxine (SYNTHROID, LEVOTHROID) 88 MCG tablet Take 88 mcg by mouth daily.      . Promethazine HCl (PHENERGAN PO) Take 25 mg by mouth every 6 (six) hours as needed.      . propranolol (INDERAL LA) 80 MG 24 hr capsule Take 80 mg by mouth daily.       Marland Kitchen  furosemide (LASIX) 40 MG tablet Take 40 mg by mouth daily.       . traMADol (ULTRAM) 50 MG tablet Take 50 mg by mouth every 6 (six) hours as needed. For pain        No results found for this or any previous visit (from the past 48 hour(s)). No results found.  Review of Systems  Constitutional: Positive for diaphoresis. Negative for fever.  HENT: Negative for hearing loss and sore throat.   Eyes: Positive for double vision (when using a computer).  Respiratory: Positive for shortness of breath.   Cardiovascular: Positive for chest pain, palpitations and orthopnea. Negative for leg swelling.  Gastrointestinal: Positive for nausea, abdominal pain and blood in stool (Hemrrhoid). Negative for heartburn.       Negative for dysphagia or odynophagia  Musculoskeletal: Negative for myalgias, back pain and joint pain.   Neurological: Positive for dizziness, tingling, tremors, weakness and headaches.  Endo/Heme/Allergies: Does not bruise/bleed easily.  Psychiatric/Behavioral: Positive for depression. Negative for memory loss and substance abuse. The patient is nervous/anxious.     Blood pressure 110/69, pulse 66, temperature 99.1 F (37.3 C), temperature source Oral, resp. rate 13, height 5\' 4"  (1.626 m), weight 147 lb (66.679 kg), SpO2 99.00%. Physical Exam  Constitutional: She appears well-developed and well-nourished. No distress.  HENT:  Head: Normocephalic and atraumatic.  Eyes: EOM are normal. Pupils are equal, round, and reactive to light. No scleral icterus.  Neck: Normal range of motion. Neck supple.  Cardiovascular: Normal rate, regular rhythm, S1 normal and S2 normal.   No murmur heard. Pulses:      Radial pulses are 2+ on the right side, and 2+ on the left side.       Dorsalis pedis pulses are 2+ on the right side, and 2+ on the left side.  No carotid Bruit.  Respiratory: Effort normal and breath sounds normal. She has no wheezes. She has no rales.  GI: Soft. There is tenderness (Epigastric area).  Musculoskeletal: She exhibits no edema.  Lymphadenopathy:    She has no cervical adenopathy.  Neurological: She is alert. She exhibits abnormal muscle tone (She appears equally weak in both arms.  ).  The patient appeared to loose focus.  She could not remember the year but was oriented to place and self.    Skin: Skin is warm and dry.  Psychiatric: She has a normal mood and affect.     Assessment/Plan 1.  Hx of TIAs  Plan:  TEE   HAGER, BRYAN 06/18/2012, 2:43 PM   I have seen and examined the patient along with Wilburt Finlay, PA.  I have reviewed the chart, notes and new data.  I agree with PA's note.  Key new complaints: her witnessed episode of disorientation was atypical and did not follow a classic neurological pattern Key examination changes: normal exam; non-focal neurological  exam. Speech is slow and halting, but not dysarthric and no true aphasia. Key new findings / data: NSR and normal hemodynamics throughout the "spell"  PLAN: Proceed with TEE. This procedure has been fully reviewed with the patient and written informed consent has been obtained. Suspect non-vascular cause of her neurological events. There is a possibility of PFO-associated migraine with unusual neurological manifestations. Will check a saline contrast study, but this would not be a coomon presentation.  Thurmon Fair, MD, [facility] [phone] 06/18/2012, 3:19 PM

## 2012-06-19 ENCOUNTER — Encounter (HOSPITAL_COMMUNITY): Payer: Self-pay | Admitting: Cardiovascular Disease

## 2012-07-02 ENCOUNTER — Other Ambulatory Visit (HOSPITAL_COMMUNITY): Payer: Self-pay | Admitting: Neurology

## 2012-07-02 DIAGNOSIS — R270 Ataxia, unspecified: Secondary | ICD-10-CM

## 2012-07-06 ENCOUNTER — Ambulatory Visit (HOSPITAL_COMMUNITY)
Admission: RE | Admit: 2012-07-06 | Discharge: 2012-07-06 | Disposition: A | Discharge status: Home / Self Care | Payer: 59 | Source: Ambulatory Visit | Attending: Neurology | Admitting: Neurology

## 2012-07-06 DIAGNOSIS — M79609 Pain in unspecified limb: Secondary | ICD-10-CM | POA: Insufficient documentation

## 2012-07-06 DIAGNOSIS — R270 Ataxia, unspecified: Secondary | ICD-10-CM

## 2012-07-06 DIAGNOSIS — R209 Unspecified disturbances of skin sensation: Secondary | ICD-10-CM | POA: Insufficient documentation

## 2012-07-06 LAB — COMPREHENSIVE METABOLIC PANEL
CO2: 25 mEq/L (ref 19–32)
Calcium: 9.1 mg/dL (ref 8.4–10.5)
Creatinine, Ser: 1.15 mg/dL — ABNORMAL HIGH (ref 0.50–1.10)
GFR calc Af Amer: 69 mL/min — ABNORMAL LOW (ref >90)
GFR calc non Af Amer: 59 mL/min — ABNORMAL LOW (ref >90)
Glucose, Bld: 93 mg/dL (ref 70–99)
Sodium: 142 mEq/L (ref 135–145)
Total Protein: 6.6 g/dL (ref 6.0–8.3)

## 2012-07-06 MED ORDER — GADOBENATE DIMEGLUMINE 529 MG/ML IV SOLN
15.0000 mL | Freq: Once | INTRAVENOUS | Status: AC | PRN
  Administered 2012-07-06: 15 mL via INTRAVENOUS

## 2012-10-12 ENCOUNTER — Other Ambulatory Visit: Payer: Self-pay | Admitting: Obstetrics and Gynecology

## 2012-10-12 DIAGNOSIS — N6452 Nipple discharge: Secondary | ICD-10-CM

## 2012-10-12 DIAGNOSIS — N63 Unspecified lump in unspecified breast: Secondary | ICD-10-CM

## 2012-10-27 ENCOUNTER — Ambulatory Visit
Admission: RE | Admit: 2012-10-27 | Discharge: 2012-10-27 | Disposition: A | Discharge status: Home / Self Care | Payer: Managed Care, Other (non HMO) | Source: Ambulatory Visit | Attending: Obstetrics and Gynecology | Admitting: Obstetrics and Gynecology

## 2012-10-27 DIAGNOSIS — N63 Unspecified lump in unspecified breast: Secondary | ICD-10-CM

## 2012-10-27 DIAGNOSIS — N6452 Nipple discharge: Secondary | ICD-10-CM

## 2012-11-16 ENCOUNTER — Inpatient Hospital Stay: Admission: RE | Admit: 2012-11-16 | Payer: 59 | Source: Ambulatory Visit

## 2012-11-25 ENCOUNTER — Ambulatory Visit: Admission: RE | Admit: 2012-11-25 | Payer: Managed Care, Other (non HMO) | Source: Ambulatory Visit

## 2013-01-17 ENCOUNTER — Encounter (HOSPITAL_COMMUNITY): Payer: Self-pay | Admitting: Emergency Medicine

## 2013-01-17 ENCOUNTER — Emergency Department (HOSPITAL_COMMUNITY)
Admission: EM | Admit: 2013-01-17 | Discharge: 2013-01-17 | Disposition: A | Discharge status: Home / Self Care | Payer: Managed Care, Other (non HMO) | Attending: Emergency Medicine | Admitting: Emergency Medicine

## 2013-01-17 ENCOUNTER — Emergency Department (HOSPITAL_COMMUNITY): Payer: Managed Care, Other (non HMO)

## 2013-01-17 DIAGNOSIS — J45909 Unspecified asthma, uncomplicated: Secondary | ICD-10-CM

## 2013-01-17 DIAGNOSIS — J13 Pneumonia due to Streptococcus pneumoniae: Secondary | ICD-10-CM | POA: Insufficient documentation

## 2013-01-17 DIAGNOSIS — M7989 Other specified soft tissue disorders: Secondary | ICD-10-CM | POA: Insufficient documentation

## 2013-01-17 DIAGNOSIS — R5381 Other malaise: Secondary | ICD-10-CM | POA: Insufficient documentation

## 2013-01-17 DIAGNOSIS — Z8719 Personal history of other diseases of the digestive system: Secondary | ICD-10-CM | POA: Insufficient documentation

## 2013-01-17 DIAGNOSIS — Z8669 Personal history of other diseases of the nervous system and sense organs: Secondary | ICD-10-CM | POA: Insufficient documentation

## 2013-01-17 DIAGNOSIS — I10 Essential (primary) hypertension: Secondary | ICD-10-CM | POA: Insufficient documentation

## 2013-01-17 DIAGNOSIS — J45901 Unspecified asthma with (acute) exacerbation: Secondary | ICD-10-CM | POA: Insufficient documentation

## 2013-01-17 DIAGNOSIS — J189 Pneumonia, unspecified organism: Secondary | ICD-10-CM

## 2013-01-17 DIAGNOSIS — F411 Generalized anxiety disorder: Secondary | ICD-10-CM | POA: Insufficient documentation

## 2013-01-17 DIAGNOSIS — Z8673 Personal history of transient ischemic attack (TIA), and cerebral infarction without residual deficits: Secondary | ICD-10-CM | POA: Insufficient documentation

## 2013-01-17 DIAGNOSIS — Z79899 Other long term (current) drug therapy: Secondary | ICD-10-CM | POA: Insufficient documentation

## 2013-01-17 DIAGNOSIS — F17200 Nicotine dependence, unspecified, uncomplicated: Secondary | ICD-10-CM | POA: Insufficient documentation

## 2013-01-17 DIAGNOSIS — E079 Disorder of thyroid, unspecified: Secondary | ICD-10-CM | POA: Insufficient documentation

## 2013-01-17 DIAGNOSIS — R609 Edema, unspecified: Secondary | ICD-10-CM | POA: Insufficient documentation

## 2013-01-17 LAB — CBC WITH DIFFERENTIAL/PLATELET
Basophils Absolute: 0 10*3/uL (ref 0.0–0.1)
Basophils Relative: 0 % (ref 0–1)
HCT: 42.8 % (ref 36.0–46.0)
Hemoglobin: 14.3 g/dL (ref 12.0–15.0)
Lymphocytes Relative: 34 % (ref 12–46)
Lymphs Abs: 2.1 10*3/uL (ref 0.7–4.0)
MCV: 100.5 fL — ABNORMAL HIGH (ref 78.0–100.0)
Monocytes Absolute: 0.5 10*3/uL (ref 0.1–1.0)
Monocytes Relative: 8 % (ref 3–12)
Neutro Abs: 3.4 10*3/uL (ref 1.7–7.7)
Neutrophils Relative %: 54 % (ref 43–77)
RBC: 4.26 MIL/uL (ref 3.87–5.11)
WBC: 6.2 10*3/uL (ref 4.0–10.5)

## 2013-01-17 LAB — PRO B NATRIURETIC PEPTIDE: Pro B Natriuretic peptide (BNP): 21.9 pg/mL (ref 0–125)

## 2013-01-17 LAB — COMPREHENSIVE METABOLIC PANEL
AST: 22 U/L (ref 0–37)
Albumin: 3.5 g/dL (ref 3.5–5.2)
Alkaline Phosphatase: 76 U/L (ref 39–117)
BUN: 16 mg/dL (ref 6–23)
CO2: 29 mEq/L (ref 19–32)
Chloride: 100 mEq/L (ref 96–112)
Creatinine, Ser: 1.06 mg/dL (ref 0.50–1.10)
GFR calc Af Amer: 75 mL/min — ABNORMAL LOW (ref >90)
GFR calc non Af Amer: 65 mL/min — ABNORMAL LOW (ref >90)
Glucose, Bld: 82 mg/dL (ref 70–99)
Potassium: 3.9 mEq/L (ref 3.5–5.1)
Total Bilirubin: 0.1 mg/dL — ABNORMAL LOW (ref 0.3–1.2)

## 2013-01-17 MED ORDER — PREDNISONE 10 MG PO TABS: ORAL_TABLET | ORAL | Status: DC

## 2013-01-17 MED ORDER — METHYLPREDNISOLONE SODIUM SUCC 125 MG IJ SOLR: 125.0000 mg | Freq: Once | INTRAMUSCULAR | Status: DC

## 2013-01-17 MED ORDER — FUROSEMIDE 20 MG PO TABS: 20.0000 mg | ORAL_TABLET | Freq: Every day | ORAL | Status: DC

## 2013-01-17 MED ORDER — PREDNISONE 20 MG PO TABS
60.0000 mg | ORAL_TABLET | Freq: Once | ORAL | Status: AC
  Administered 2013-01-17: 60 mg via ORAL
  Filled 2013-01-17: qty 3

## 2013-01-17 MED ORDER — ALBUTEROL (5 MG/ML) CONTINUOUS INHALATION SOLN
10.0000 mg/h | INHALATION_SOLUTION | Freq: Once | RESPIRATORY_TRACT | Status: AC
  Administered 2013-01-17: 10 mg/h via RESPIRATORY_TRACT
  Filled 2013-01-17: qty 20

## 2013-01-17 MED ORDER — ALBUTEROL SULFATE (2.5 MG/3ML) 0.083% IN NEBU: 2.5000 mg | INHALATION_SOLUTION | Freq: Four times a day (QID) | RESPIRATORY_TRACT | Status: DC | PRN

## 2013-01-17 MED ORDER — HYDROCHLOROTHIAZIDE 12.5 MG PO TABS: 12.5000 mg | ORAL_TABLET | Freq: Every day | ORAL | Status: DC

## 2013-01-17 MED ORDER — HYDROCOD POLST-CHLORPHEN POLST 10-8 MG/5ML PO LQCR: 5.0000 mL | Freq: Two times a day (BID) | ORAL | Status: DC

## 2013-01-17 NOTE — ED Notes (Signed)
Dr james at bedside. 

## 2013-01-17 NOTE — ED Provider Notes (Addendum)
CSN: [id number]     Arrival date & time 01/17/13  0740 History   First MD Initiated Contact with Patient 01/17/13 0745     Chief Complaint  Patient presents with  . Shortness of Breath  . Pneumonia    HPI  Patient presents with shortness of breath. Describes about a 7 day illness. Coughing and wheezing. Had a fever. 6 days ago was seen by her primary care physician. Given a dose of prednisone. Discharged with prescription for Omnicef. She continued to have shortness of breath patient also dependent edema over the last few months and presents here. Has history of hypertension. Has history dependent edema. Apparently a TIA symptoms and continues on Aggrenox. Had a TEE was normal. No history of some palpitations. She has had a cough for a week intermittent fevers not getting better with the antibiotics. Continues to have wheezing despite albuterol. Is now out of her albuterol  Past Medical History  Diagnosis Date  . Allergy   . Anxiety   . Asthma   . GERD (gastroesophageal reflux disease)   . Hypertension   . Thyroid disease   . TIA (transient ischemic attack)   . Hearing loss   . Bruises easily   . Leg swelling     both  . Cough    Past Surgical History  Procedure Laterality Date  . Cholecystectomy    . Appendectomy    . Abdominal hysterectomy    . Tee without cardioversion N/A 06/18/2012    Procedure: TRANSESOPHAGEAL ECHOCARDIOGRAM (TEE);  Surgeon: Thurmon Fair, MD;  [facility] ENDOSCOPY;  Service: Cardiovascular;  Laterality: N/A;   Family History  Problem Relation Age of Onset  . Hypertension Sister   . Cancer Paternal Grandmother     breast   History  Substance Use Topics  . Smoking status: Current Every Day Smoker -- 0.50 packs/day  . Smokeless tobacco: Not on file  . Alcohol Use: No   OB History   Grav Para Term Preterm Abortions TAB SAB Ect Mult Living                 Review of Systems  Constitutional: Positive for fever and fatigue. Negative for chills,  diaphoresis and appetite change.  HENT: Negative for mouth sores, sore throat and trouble swallowing.   Eyes: Negative for visual disturbance.  Respiratory: Positive for cough and shortness of breath. Negative for chest tightness and wheezing.   Cardiovascular: Positive for leg swelling. Negative for chest pain.  Gastrointestinal: Negative for nausea, vomiting, abdominal pain, diarrhea and abdominal distention.  Endocrine: Negative for polydipsia, polyphagia and polyuria.  Genitourinary: Negative for dysuria, frequency and hematuria.  Musculoskeletal: Negative for gait problem.  Skin: Negative for color change, pallor and rash.  Neurological: Negative for dizziness, syncope, light-headedness and headaches.  Hematological: Does not bruise/bleed easily.  Psychiatric/Behavioral: Negative for behavioral problems and confusion.    Allergies  Codeine  Home Medications   Current Outpatient Rx  Name  Route  Sig  Dispense  Refill  . albuterol (PROVENTIL HFA;VENTOLIN HFA) 108 (90 BASE) MCG/ACT inhaler   Inhalation   Inhale 2 puffs into the lungs every 6 (six) hours as needed. For shortness of breath         . cefdinir (OMNICEF) 300 MG capsule   Oral   Take 300 mg by mouth 2 (two) times daily.         . diazepam (VALIUM) 5 MG tablet   Oral   Take 7.5 mg by mouth  2 (two) times daily.          Marland Kitchen dipyridamole-aspirin (AGGRENOX) 200-25 MG per 12 hr capsule   Oral   Take 1 capsule by mouth daily.         Marland Kitchen gabapentin (NEURONTIN) 300 MG capsule   Oral   Take 300 mg by mouth 3 (three) times daily.         . hydrochlorothiazide (MICROZIDE) 12.5 MG capsule   Oral   Take 12.5 mg by mouth daily.         . Levomilnacipran HCl ER (FETZIMA) 80 MG CP24   Oral   Take 1 capsule by mouth daily.         Marland Kitchen levothyroxine (SYNTHROID, LEVOTHROID) 88 MCG tablet   Oral   Take 88 mcg by mouth daily.         . Milnacipran (SAVELLA) 50 MG TABS tablet   Oral   Take 50 mg by mouth 2  (two) times daily.         . promethazine (PHENERGAN) 25 MG tablet   Oral   Take 25 mg by mouth every 6 (six) hours as needed for nausea or vomiting.         . propranolol (INDERAL LA) 80 MG 24 hr capsule   Oral   Take 80 mg by mouth daily.          Marland Kitchen albuterol (PROVENTIL) (2.5 MG/3ML) 0.083% nebulizer solution   Nebulization   Take 3 mLs (2.5 mg total) by nebulization every 6 (six) hours as needed for wheezing or shortness of breath.   75 mL   12   . chlorpheniramine-HYDROcodone (TUSSIONEX PENNKINETIC ER) 10-8 MG/5ML LQCR   Oral   Take 5 mLs by mouth every 12 (twelve) hours.   60 mL   0   . hydrochlorothiazide (HYDRODIURIL) 12.5 MG tablet   Oral   Take 1 tablet (12.5 mg total) by mouth daily.   30 tablet   0   . predniSONE (DELTASONE) 10 MG tablet      2 po q day x 5 days, then 1 po q day   15 tablet   0    BP 111/64  Pulse 73  Temp(Src) 98.3 F (36.8 C) (Oral)  Resp 13  SpO2 100% Physical Exam  Constitutional: She is oriented to person, place, and time. She appears well-developed and well-nourished. No distress.  HENT:  Head: Normocephalic.  Eyes: Conjunctivae are normal. Pupils are equal, round, and reactive to light. No scleral icterus.  Neck: Normal range of motion. Neck supple. No thyromegaly present.  Cardiovascular: Normal rate and regular rhythm.  Exam reveals no gallop, no S3, no S4, no distant heart sounds and no friction rub.   No murmur heard. Sinus Rhythm. Regular heart tones. No ST or for gallop  Pulmonary/Chest: Effort normal and breath sounds normal. No respiratory distress. She has no wheezes. She has no rales.  Diffuse wheezing and prolongation. No focal signs of consolidation clinically  Abdominal: Soft. Bowel sounds are normal. She exhibits no distension. There is no tenderness. There is no rebound.  Musculoskeletal: Normal range of motion.  Neurological: She is alert and oriented to person, place, and time.  Skin: Skin is warm and  dry. No rash noted.  Trace symmetric bilateral shin edema. No cording swelling or pain in the calves  Psychiatric: She has a normal mood and affect. Her behavior is normal.    ED Course  Procedures (including critical care time) Labs  Review Labs Reviewed  CBC WITH DIFFERENTIAL - Abnormal; Notable for the following:    MCV 100.5 (*)    All other components within normal limits  COMPREHENSIVE METABOLIC PANEL - Abnormal; Notable for the following:    Total Bilirubin 0.1 (*)    GFR calc non Af Amer 65 (*)    GFR calc Af Amer 75 (*)    All other components within normal limits  TROPONIN I  PRO B NATRIURETIC PEPTIDE   Imaging Review Dg Chest 2 View  01/17/2013   CLINICAL DATA:  Asthma.  EXAM: CHEST  2 VIEW  COMPARISON:  10/19/2010.  FINDINGS: Lingular subsegmental atelectasis versus mild infiltrate present. Lungs are otherwise clear. No pleural effusion or pneumothorax. Mediastinum and hilar structures are normal. Heart size is normal. Surgical clips right upper quadrant. No acute bony abnormality.  IMPRESSION: Mild subsegmental atelectasis in lingula, otherwise negative chest.   Electronically Signed   By: Maisie Fus  Register   On: 01/17/2013 12:41    EKG Interpretation   None       MDM   1. Lingular pneumonia   2. Asthma   3. Edema    Chest pains shortness of breath peripheral edema differential diagnoses would include congestive heart failure renal dysfunction pulmonary process with dependent edema, cor pulmonale. She is a lingular pneumonia on x-ray. Wheezing initially however she resolves with just albuterol. She is well oxygenated. Her kidney function is intact. X-rays also CHF. BNP not elevated. I think the [facility] is adequate treatment for a community-acquired lobar pneumonia. She'll continue to [facility]. Prescription for Tussionex for cough. Refill albuterol solution. Prednisone taper. Lasix. PCP follow up.    Roney Marion, MD 01/17/13 1322  Roney Marion, MD 01/17/13  1323  Roney Marion, MD 01/17/13 1323

## 2013-01-17 NOTE — ED Notes (Signed)
Pt reports being diagnosed with pneumonia and still having sob, cough, back pain when she coughs and breaths. Having productive cough with thick white sputum. No relief with breathing tx and inhalers. Also reports shingles x 3 months.

## 2013-01-17 NOTE — ED Notes (Signed)
Patient transported to X-ray 

## 2013-04-01 DIAGNOSIS — R112 Nausea with vomiting, unspecified: Secondary | ICD-10-CM

## 2013-04-01 HISTORY — DX: Nausea with vomiting, unspecified: R11.2

## 2013-07-05 ENCOUNTER — Other Ambulatory Visit (INDEPENDENT_AMBULATORY_CARE_PROVIDER_SITE_OTHER): Payer: Self-pay | Admitting: General Surgery

## 2013-07-07 ENCOUNTER — Other Ambulatory Visit (INDEPENDENT_AMBULATORY_CARE_PROVIDER_SITE_OTHER): Payer: Self-pay | Admitting: General Surgery

## 2013-08-11 ENCOUNTER — Other Ambulatory Visit: Payer: Self-pay | Admitting: Cardiology

## 2013-08-11 ENCOUNTER — Other Ambulatory Visit (HOSPITAL_COMMUNITY): Payer: Self-pay | Admitting: Cardiology

## 2013-08-11 DIAGNOSIS — G510 Bell's palsy: Secondary | ICD-10-CM

## 2013-08-19 ENCOUNTER — Other Ambulatory Visit: Payer: 59

## 2013-08-26 ENCOUNTER — Other Ambulatory Visit: Payer: Self-pay | Admitting: Urology

## 2013-08-26 DIAGNOSIS — R200 Anesthesia of skin: Secondary | ICD-10-CM

## 2013-08-26 DIAGNOSIS — R270 Ataxia, unspecified: Secondary | ICD-10-CM

## 2013-09-03 ENCOUNTER — Ambulatory Visit
Admission: RE | Admit: 2013-09-03 | Discharge: 2013-09-03 | Disposition: A | Discharge status: Home / Self Care | Payer: Managed Care, Other (non HMO) | Source: Ambulatory Visit | Attending: Cardiology | Admitting: Cardiology

## 2013-09-03 ENCOUNTER — Ambulatory Visit
Admission: RE | Admit: 2013-09-03 | Discharge: 2013-09-03 | Disposition: A | Discharge status: Home / Self Care | Payer: Managed Care, Other (non HMO) | Source: Ambulatory Visit | Attending: Urology | Admitting: Urology

## 2013-09-03 DIAGNOSIS — R270 Ataxia, unspecified: Secondary | ICD-10-CM

## 2013-09-03 DIAGNOSIS — R200 Anesthesia of skin: Secondary | ICD-10-CM

## 2013-09-03 DIAGNOSIS — G510 Bell's palsy: Secondary | ICD-10-CM

## 2013-09-03 MED ORDER — GADOBENATE DIMEGLUMINE 529 MG/ML IV SOLN
16.0000 mL | Freq: Once | INTRAVENOUS | Status: AC | PRN
  Administered 2013-09-03: 16 mL via INTRAVENOUS

## 2013-09-27 ENCOUNTER — Emergency Department (HOSPITAL_COMMUNITY)
Admission: EM | Admit: 2013-09-27 | Discharge: 2013-09-27 | Disposition: A | Discharge status: Home / Self Care | Payer: Managed Care, Other (non HMO) | Attending: Emergency Medicine | Admitting: Emergency Medicine

## 2013-09-27 ENCOUNTER — Emergency Department (HOSPITAL_COMMUNITY): Payer: Managed Care, Other (non HMO)

## 2013-09-27 ENCOUNTER — Encounter (HOSPITAL_COMMUNITY): Payer: Self-pay | Admitting: Emergency Medicine

## 2013-09-27 DIAGNOSIS — F411 Generalized anxiety disorder: Secondary | ICD-10-CM | POA: Diagnosis not present

## 2013-09-27 DIAGNOSIS — G459 Transient cerebral ischemic attack, unspecified: Secondary | ICD-10-CM | POA: Diagnosis not present

## 2013-09-27 DIAGNOSIS — F17200 Nicotine dependence, unspecified, uncomplicated: Secondary | ICD-10-CM | POA: Insufficient documentation

## 2013-09-27 DIAGNOSIS — Z8719 Personal history of other diseases of the digestive system: Secondary | ICD-10-CM | POA: Insufficient documentation

## 2013-09-27 DIAGNOSIS — J45909 Unspecified asthma, uncomplicated: Secondary | ICD-10-CM | POA: Insufficient documentation

## 2013-09-27 DIAGNOSIS — R079 Chest pain, unspecified: Secondary | ICD-10-CM

## 2013-09-27 DIAGNOSIS — M7981 Nontraumatic hematoma of soft tissue: Secondary | ICD-10-CM | POA: Diagnosis not present

## 2013-09-27 DIAGNOSIS — R072 Precordial pain: Secondary | ICD-10-CM | POA: Diagnosis present

## 2013-09-27 DIAGNOSIS — I10 Essential (primary) hypertension: Secondary | ICD-10-CM | POA: Diagnosis not present

## 2013-09-27 DIAGNOSIS — Z8673 Personal history of transient ischemic attack (TIA), and cerebral infarction without residual deficits: Secondary | ICD-10-CM | POA: Insufficient documentation

## 2013-09-27 DIAGNOSIS — R4789 Other speech disturbances: Secondary | ICD-10-CM | POA: Insufficient documentation

## 2013-09-27 DIAGNOSIS — Z79899 Other long term (current) drug therapy: Secondary | ICD-10-CM | POA: Insufficient documentation

## 2013-09-27 DIAGNOSIS — M549 Dorsalgia, unspecified: Secondary | ICD-10-CM | POA: Diagnosis not present

## 2013-09-27 DIAGNOSIS — R0602 Shortness of breath: Secondary | ICD-10-CM

## 2013-09-27 DIAGNOSIS — E079 Disorder of thyroid, unspecified: Secondary | ICD-10-CM | POA: Insufficient documentation

## 2013-09-27 DIAGNOSIS — IMO0001 Reserved for inherently not codable concepts without codable children: Secondary | ICD-10-CM | POA: Diagnosis not present

## 2013-09-27 HISTORY — DX: Fibromyalgia: M79.7

## 2013-09-27 LAB — COMPREHENSIVE METABOLIC PANEL
ALT: 16 U/L (ref 0–35)
ANION GAP: 14 (ref 5–15)
AST: 35 U/L (ref 0–37)
Albumin: 3.8 g/dL (ref 3.5–5.2)
Alkaline Phosphatase: 84 U/L (ref 39–117)
BUN: 10 mg/dL (ref 6–23)
CO2: 26 mEq/L (ref 19–32)
CREATININE: 0.88 mg/dL (ref 0.50–1.10)
Calcium: 9.5 mg/dL (ref 8.4–10.5)
Chloride: 97 mEq/L (ref 96–112)
GFR calc non Af Amer: 81 mL/min — ABNORMAL LOW (ref >90)
GLUCOSE: 113 mg/dL — AB (ref 70–99)
POTASSIUM: 4.6 meq/L (ref 3.7–5.3)
Sodium: 137 mEq/L (ref 137–147)
TOTAL PROTEIN: 6.9 g/dL (ref 6.0–8.3)
Total Bilirubin: <0.2 mg/dL — ABNORMAL LOW (ref 0.3–1.2)

## 2013-09-27 LAB — CBC WITH DIFFERENTIAL/PLATELET
Basophils Absolute: 0 10*3/uL (ref 0.0–0.1)
Basophils Relative: 0 % (ref 0–1)
EOS ABS: 0.3 10*3/uL (ref 0.0–0.7)
Eosinophils Relative: 5 % (ref 0–5)
HCT: 44.3 % (ref 36.0–46.0)
Hemoglobin: 15.3 g/dL — ABNORMAL HIGH (ref 12.0–15.0)
Lymphocytes Relative: 29 % (ref 12–46)
Lymphs Abs: 1.9 10*3/uL (ref 0.7–4.0)
MCH: 34.5 pg — ABNORMAL HIGH (ref 26.0–34.0)
MCHC: 34.5 g/dL (ref 30.0–36.0)
MCV: 99.8 fL (ref 78.0–100.0)
Monocytes Absolute: 0.4 10*3/uL (ref 0.1–1.0)
Monocytes Relative: 6 % (ref 3–12)
NEUTROS PCT: 60 % (ref 43–77)
Neutro Abs: 3.8 10*3/uL (ref 1.7–7.7)
PLATELETS: 289 10*3/uL (ref 150–400)
RBC: 4.44 MIL/uL (ref 3.87–5.11)
RDW: 13 % (ref 11.5–15.5)
WBC: 6.4 10*3/uL (ref 4.0–10.5)

## 2013-09-27 LAB — URINALYSIS, ROUTINE W REFLEX MICROSCOPIC
Bilirubin Urine: NEGATIVE
GLUCOSE, UA: NEGATIVE mg/dL
Ketones, ur: NEGATIVE mg/dL
LEUKOCYTES UA: NEGATIVE
Nitrite: NEGATIVE
PROTEIN: NEGATIVE mg/dL
SPECIFIC GRAVITY, URINE: 1.008 (ref 1.005–1.030)
UROBILINOGEN UA: 0.2 mg/dL (ref 0.0–1.0)
pH: 6 (ref 5.0–8.0)

## 2013-09-27 LAB — I-STAT TROPONIN, ED: TROPONIN I, POC: 0 ng/mL (ref 0.00–0.08)

## 2013-09-27 LAB — URINE MICROSCOPIC-ADD ON: RBC / HPF: NONE SEEN RBC/hpf (ref <3)

## 2013-09-27 LAB — LIPASE, BLOOD: Lipase: 45 U/L (ref 11–59)

## 2013-09-27 MED ORDER — TRAMADOL HCL 50 MG PO TABS: 50.0000 mg | ORAL_TABLET | Freq: Four times a day (QID) | ORAL | Status: DC | PRN

## 2013-09-27 MED ORDER — SODIUM CHLORIDE 0.9 % IV BOLUS (SEPSIS)
250.0000 mL | Freq: Once | INTRAVENOUS | Status: AC
  Administered 2013-09-27: 250 mL via INTRAVENOUS

## 2013-09-27 MED ORDER — ONDANSETRON 4 MG PO TBDP: 4.0000 mg | ORAL_TABLET | Freq: Three times a day (TID) | ORAL | Status: DC | PRN

## 2013-09-27 MED ORDER — SODIUM CHLORIDE 0.9 % IV SOLN
INTRAVENOUS | Status: DC
  Administered 2013-09-27: 13:00:00 via INTRAVENOUS

## 2013-09-27 MED ORDER — IOHEXOL 300 MG/ML  SOLN
75.0000 mL | Freq: Once | INTRAMUSCULAR | Status: AC | PRN
  Administered 2013-09-27: 75 mL via INTRAVENOUS

## 2013-09-27 MED ORDER — HYDROMORPHONE HCL PF 1 MG/ML IJ SOLN
1.0000 mg | Freq: Once | INTRAMUSCULAR | Status: AC
  Administered 2013-09-27: 1 mg via INTRAVENOUS
  Filled 2013-09-27: qty 1

## 2013-09-27 MED ORDER — ONDANSETRON HCL 4 MG/2ML IJ SOLN
4.0000 mg | Freq: Once | INTRAMUSCULAR | Status: AC
  Administered 2013-09-27: 4 mg via INTRAVENOUS
  Filled 2013-09-27: qty 2

## 2013-09-27 NOTE — Discharge Instructions (Signed)
Workup for the chest pain without evidence of any acute cardiac event no evidence of any significant pulmonary event. Doppler studies of your legs were negative for any deep vein clots. The symptoms on the left sided chest seemed to be chest wall in nature. Take the tramadol as directed takes Zofran in case the tramadol patient nauseated. Continue your followup with your regular Dr. and also the neurology.

## 2013-09-27 NOTE — ED Provider Notes (Signed)
CSN: [id number]     Arrival date & time 09/27/13  1146 History   First MD Initiated Contact with Patient 09/27/13 1200     Chief Complaint  Patient presents with  . Chest Pain  . Shortness of Breath     (Consider location/radiation/quality/duration/timing/severity/associated sxs/prior Treatment) The history is provided by the patient and the spouse.   43 year old female primary care doctors in [place] area. Presents today for left substernal chest pain popping sensation along the sternal rib margin on the left side. Impression since Friday does radiate to the back. Made worse by movement of the left arm and occasionally by deep breath. Patient does have shortness of breath. Patient's been treating at home with Motrin. Patient is also undergoing workup for possible MS due to some neurological problems mostly speech. Patient has a history of fibromyalgia. The chest pain is sharp in nature an 8/10 when present. Feels better without any movement. No distinct injury however was doing a lot of housework on Thursday and may have aggravated the area.  Past Medical History  Diagnosis Date  . Allergy   . Anxiety   . Asthma   . GERD (gastroesophageal reflux disease)   . Hypertension   . Thyroid disease   . TIA (transient ischemic attack)   . Hearing loss   . Bruises easily   . Leg swelling     both  . Cough   . Fibromyalgia    Past Surgical History  Procedure Laterality Date  . Cholecystectomy    . Appendectomy    . Abdominal hysterectomy    . Tee without cardioversion N/A 06/18/2012    Procedure: TRANSESOPHAGEAL ECHOCARDIOGRAM (TEE);  Surgeon: Thurmon Fair, MD;  Location[facility] ENDOSCOPY;  Service: Cardiovascular;  Laterality: N/A;   Family History  Problem Relation Age of Onset  . Hypertension Sister   . Cancer Paternal Grandmother     breast   History  Substance Use Topics  . Smoking status: Current Every Day Smoker -- 0.50 packs/day  . Smokeless tobacco: Not on file  .  Alcohol Use: No   OB History   Grav Para Term Preterm Abortions TAB SAB Ect Mult Living                 Review of Systems  Constitutional: Negative for fever.  HENT: Negative for congestion.   Eyes: Negative for visual disturbance.  Respiratory: Positive for cough and shortness of breath.   Cardiovascular: Positive for chest pain.  Gastrointestinal: Negative for nausea, vomiting and abdominal pain.  Genitourinary: Negative for dysuria.  Musculoskeletal: Positive for back pain and myalgias.  Skin: Negative for rash.  Neurological: Positive for speech difficulty. Negative for facial asymmetry and light-headedness.  Hematological: Bruises/bleeds easily.  Psychiatric/Behavioral: Negative for confusion.      Allergies  Codeine  Home Medications   Prior to Admission medications   Medication Sig Start Date End Date Taking? Authorizing Provider  albuterol (PROVENTIL HFA;VENTOLIN HFA) 108 (90 BASE) MCG/ACT inhaler Inhale 2 puffs into the lungs every 6 (six) hours as needed. For shortness of breath   Yes Historical Provider, MD  diazepam (VALIUM) 5 MG tablet Take 7.5 mg by mouth 2 (two) times daily.    Yes Historical Provider, MD  dipyridamole-aspirin (AGGRENOX) 200-25 MG per 12 hr capsule Take 1 capsule by mouth daily.   Yes Historical Provider, MD  furosemide (LASIX) 20 MG tablet Take 1 tablet (20 mg total) by mouth daily. 01/17/13  Yes Rolland Porter, MD  hydrochlorothiazide (  HYDRODIURIL) 12.5 MG tablet Take 1 tablet (12.5 mg total) by mouth daily. 01/17/13  Yes Rolland Porter, MD  ibuprofen (ADVIL,MOTRIN) 200 MG tablet Take 800 mg by mouth every 6 (six) hours as needed for mild pain.   Yes Historical Provider, MD  Levomilnacipran HCl ER (FETZIMA) 80 MG CP24 Take 1 capsule by mouth daily.   Yes Historical Provider, MD  levothyroxine (SYNTHROID, LEVOTHROID) 88 MCG tablet Take 88 mcg by mouth daily.   Yes Historical Provider, MD  promethazine (PHENERGAN) 25 MG tablet Take 25 mg by mouth every 6  (six) hours as needed for nausea or vomiting.   Yes Historical Provider, MD  propranolol (INDERAL LA) 80 MG 24 hr capsule Take 80 mg by mouth daily.    Yes Historical Provider, MD  ondansetron (ZOFRAN ODT) 4 MG disintegrating tablet Take 1 tablet (4 mg total) by mouth every 8 (eight) hours as needed for nausea or vomiting. 09/27/13   Vanetta Mulders, MD  traMADol (ULTRAM) 50 MG tablet Take 1 tablet (50 mg total) by mouth every 6 (six) hours as needed. 09/27/13   Vanetta Mulders, MD   BP 105/63  Pulse 59  Temp(Src) 98.3 F (36.8 C) (Oral)  Resp 10  SpO2 97% Physical Exam  Nursing note and vitals reviewed. Constitutional: She is oriented to person, place, and time. She appears well-developed and well-nourished. No distress.  HENT:  Head: Normocephalic and atraumatic.  Mouth/Throat: Oropharynx is clear and moist.  Eyes: Conjunctivae and EOM are normal. Pupils are equal, round, and reactive to light.  Neck: Normal range of motion.  Cardiovascular: Normal rate, regular rhythm and normal heart sounds.   No murmur heard. Pulmonary/Chest: Effort normal and breath sounds normal. No respiratory distress. She has no wheezes. She has no rales. She exhibits tenderness.  Tenderness to palpation on left-sided chest at rib sternal junction.  Abdominal: Soft. Bowel sounds are normal. There is no tenderness.  Musculoskeletal: Normal range of motion. She exhibits edema. She exhibits no tenderness.  Swelling to the right leg.  Neurological: She is alert and oriented to person, place, and time.   Patient with some speech abnormality that is the baseline and being worked up. Otherwise cranial nerves were normal. No significant motor deficit.  Skin: Skin is warm. No rash noted.    ED Course  Procedures (including critical care time) Labs Review Labs Reviewed  COMPREHENSIVE METABOLIC PANEL - Abnormal; Notable for the following:    Glucose, Bld 113 (*)    Total Bilirubin <0.2 (*)    GFR calc non Af Amer  81 (*)    All other components within normal limits  CBC WITH DIFFERENTIAL - Abnormal; Notable for the following:    Hemoglobin 15.3 (*)    MCH 34.5 (*)    All other components within normal limits  URINALYSIS, ROUTINE W REFLEX MICROSCOPIC - Abnormal; Notable for the following:    Hgb urine dipstick TRACE (*)    All other components within normal limits  LIPASE, BLOOD  URINE MICROSCOPIC-ADD ON  I-STAT TROPOININ, ED   Results for orders placed during the hospital encounter of 09/27/13  COMPREHENSIVE METABOLIC PANEL      Result Value Ref Range   Sodium 137  137 - 147 mEq/L   Potassium 4.6  3.7 - 5.3 mEq/L   Chloride 97  96 - 112 mEq/L   CO2 26  19 - 32 mEq/L   Glucose, Bld 113 (*) 70 - 99 mg/dL   BUN 10  6 -  23 mg/dL   Creatinine, Ser 1.19  0.50 - 1.10 mg/dL   Calcium 9.5  8.4 - 14.7 mg/dL   Total Protein 6.9  6.0 - 8.3 g/dL   Albumin 3.8  3.5 - 5.2 g/dL   AST 35  0 - 37 U/L   ALT 16  0 - 35 U/L   Alkaline Phosphatase 84  39 - 117 U/L   Total Bilirubin <0.2 (*) 0.3 - 1.2 mg/dL   GFR calc non Af Amer 81 (*) >90 mL/min   GFR calc Af Amer >90  >90 mL/min   Anion gap 14  5 - 15  LIPASE, BLOOD      Result Value Ref Range   Lipase 45  11 - 59 U/L  CBC WITH DIFFERENTIAL      Result Value Ref Range   WBC 6.4  4.0 - 10.5 K/uL   RBC 4.44  3.87 - 5.11 MIL/uL   Hemoglobin 15.3 (*) 12.0 - 15.0 g/dL   HCT 82.9  56.2 - 13.0 %   MCV 99.8  78.0 - 100.0 fL   MCH 34.5 (*) 26.0 - 34.0 pg   MCHC 34.5  30.0 - 36.0 g/dL   RDW 86.5  78.4 - 69.6 %   Platelets 289  150 - 400 K/uL   Neutrophils Relative % 60  43 - 77 %   Neutro Abs 3.8  1.7 - 7.7 K/uL   Lymphocytes Relative 29  12 - 46 %   Lymphs Abs 1.9  0.7 - 4.0 K/uL   Monocytes Relative 6  3 - 12 %   Monocytes Absolute 0.4  0.1 - 1.0 K/uL   Eosinophils Relative 5  0 - 5 %   Eosinophils Absolute 0.3  0.0 - 0.7 K/uL   Basophils Relative 0  0 - 1 %   Basophils Absolute 0.0  0.0 - 0.1 K/uL  URINALYSIS, ROUTINE W REFLEX MICROSCOPIC       Result Value Ref Range   Color, Urine YELLOW  YELLOW   APPearance CLEAR  CLEAR   Specific Gravity, Urine 1.008  1.005 - 1.030   pH 6.0  5.0 - 8.0   Glucose, UA NEGATIVE  NEGATIVE mg/dL   Hgb urine dipstick TRACE (*) NEGATIVE   Bilirubin Urine NEGATIVE  NEGATIVE   Ketones, ur NEGATIVE  NEGATIVE mg/dL   Protein, ur NEGATIVE  NEGATIVE mg/dL   Urobilinogen, UA 0.2  0.0 - 1.0 mg/dL   Nitrite NEGATIVE  NEGATIVE   Leukocytes, UA NEGATIVE  NEGATIVE  URINE MICROSCOPIC-ADD ON      Result Value Ref Range   Squamous Epithelial / LPF RARE  RARE   WBC, UA 0-2  <3 WBC/hpf   RBC / HPF    <3 RBC/hpf   Value: NO FORMED ELEMENTS SEEN ON URINE MICROSCOPIC EXAMINATION   Bacteria, UA RARE  RARE  I-STAT TROPOININ, ED      Result Value Ref Range   Troponin i, poc 0.00  0.00 - 0.08 ng/mL   Comment 3              Imaging Review Dg Ribs Unilateral W/chest Left  09/27/2013   CLINICAL DATA:  Left chest pain radiating into the ribs for 1 week.  EXAM: LEFT RIBS AND CHEST - 3+ VIEW  COMPARISON:  01/17/2013  FINDINGS: Linear and oval-shaped lesion in the lingula stable from 01/17/2013 but new compared to 9/20 1/12. Dimensions 3.9 x 1.0 cm.  The lungs appear otherwise clear. Cardiac and  mediastinal margins appear normal. No pleural effusion. No displaced rib fracture observed.  IMPRESSION: 1. Oval/bandlike density, stable from 01/17/2013 but new from 2012. Although the shape is not highly compelling for malignancy, and this could represent a scar, the persistence warrants dedicated chest CT characterization (with contrast if feasible).   Electronically Signed   By: Herbie Baltimore M.D.   On: 09/27/2013 13:20   Ct Chest W Contrast  09/27/2013   CLINICAL DATA:  Knee in and left-sided chest pain for 4 days with productive cough and shortness of breath; abnormal findings on chest x-ray today; history of asthma and hypertension  EXAM: CT CHEST WITH CONTRAST  TECHNIQUE: Multidetector CT imaging of the chest was performed  during intravenous contrast administration.  CONTRAST:  75mL OMNIPAQUE IOHEXOL 300 MG/ML  SOLN  COMPARISON:  Left rib series of today's date.  FINDINGS: There is patchy density in the lingula and inferiorly medially in the right lower lobe. Elsewhere the lungs are clear. There is no pleural effusion. The study was not tailored for pulmonary arterial evaluation. The heart and visualized pulmonary vascularity are normal. The caliber of the thoracic aorta is normal. There is no mediastinal nor hilar lymphadenopathy.  Within the upper abdomen the observed portions of the liver and spleen and adrenal glands are normal.  IMPRESSION: 1. There is minimal subsegmental atelectasis in the lingula and anteriorly in the right middle lobe. The etiology for this is not clear. Elsewhere the lungs are clear. 2. There is no mediastinal or hilar lymphadenopathy. 3. There is no evidence of CHF nor other acute cardiopulmonary abnormality.   Electronically Signed   By: David  [place]   On: 09/27/2013 16:06     EKG Interpretation   Date/Time:  Monday September 27 2013 11:53:27 EDT Ventricular Rate:  81 PR Interval:  158 QRS Duration: 72 QT Interval:  358 QTC Calculation: 415 R Axis:   45 Text Interpretation:  Normal sinus rhythm Low voltage QRS Borderline ECG  wandering baseline Confirmed by Jayana Kotula  MD, Marianita Botkin ([id number]) on 09/27/2013  12:01:05 PM      MDM   Final diagnoses:  Chest pain, unspecified chest pain type    Patient here for left-sided chest pain with popping sensation since Friday also associated with shortness of breath. Patient thought about having a productive cough in triage but did not emphasize that much on my conversation with her. Patient does have a history of asthma. She does have a beer all inhalers. Workup negative for acute cardiac event EKG without significant abnormalities troponin was negative x-rays of the chest showed no evidence of pneumonia or cracked ribs. Also CT of the chest was done  due to the findings on the plain x-ray raising concern for an abnormality in the lingular area. On CT with contrast that was more consistent with atelectasis. Patient did have Doppler studies of her legs which were negative for any DVTs. Patient states that she's had swelling for 3 weeks in the right leg. Since that was negative did not feel was significant to do CT angios so we did just CT with contrast to evaluate the abnormal finding on the chest x-ray not to rule in or without PE. Patient will be discharged home with tramadol and followup with her doctor. Zofran at its and sometimes pain medicines make her nauseated. Patient is also undergoing neurological workup for concerns for possible MS. Patient also does have a history of fibromyalgia  But as stated feel that the chest pain is more chest  wall related probably due to some cartilage or rib irritation particularly with the history of a popping sensation.  Vanetta Mulders, MD 09/27/13 [id number]

## 2013-09-27 NOTE — ED Notes (Signed)
Reports onset Friday of mid and left side chest pain that radiates into left shoulder and back. Reports "hearing a clicking noise in her chest." also having sob and productive cough with brown sputum. Airway intact, ekg done at triage.

## 2013-09-27 NOTE — ED Notes (Signed)
Brought pt back to room via wheelchair with family in tow; pt given a gown to place on; family at bedside; Laban Emperor, EMT at bedside

## 2013-09-27 NOTE — Progress Notes (Signed)
*  Preliminary Results* Bilateral lower extremity venous duplex completed. Bilateral lower extremities are negative for deep vein thrombosis. There is no evidence of Baker's cyst bilaterally.  09/27/2013 3:31 PM  Gertie Fey, RVT, RDCS, RDMS

## 2013-11-11 ENCOUNTER — Encounter: Payer: Self-pay | Admitting: *Deleted

## 2013-11-16 ENCOUNTER — Ambulatory Visit (INDEPENDENT_AMBULATORY_CARE_PROVIDER_SITE_OTHER): Payer: Managed Care, Other (non HMO) | Admitting: Neurology

## 2013-11-16 ENCOUNTER — Encounter: Payer: Self-pay | Admitting: Neurology

## 2013-11-16 VITALS — BP 100/60 | HR 88 | Ht 64.0 in | Wt 176.0 lb

## 2013-11-16 DIAGNOSIS — R2681 Unsteadiness on feet: Secondary | ICD-10-CM

## 2013-11-16 DIAGNOSIS — F32A Depression, unspecified: Secondary | ICD-10-CM

## 2013-11-16 DIAGNOSIS — F329 Major depressive disorder, single episode, unspecified: Secondary | ICD-10-CM

## 2013-11-16 DIAGNOSIS — R4189 Other symptoms and signs involving cognitive functions and awareness: Secondary | ICD-10-CM

## 2013-11-16 DIAGNOSIS — R479 Unspecified speech disturbances: Secondary | ICD-10-CM

## 2013-11-16 DIAGNOSIS — F482 Pseudobulbar affect: Secondary | ICD-10-CM

## 2013-11-16 DIAGNOSIS — R251 Tremor, unspecified: Secondary | ICD-10-CM

## 2013-11-16 NOTE — Patient Instructions (Signed)
Unfortunately, I cannot make any recommendations until I am able to review all of your notes.  We will have you return to the office to discuss further.

## 2013-11-16 NOTE — Progress Notes (Addendum)
NEUROLOGY CONSULTATION NOTE  Sabrina Roberson MRN: [id number] DOB: [birth year]  Referring provider: Roxanne Mins Primary care provider: Casimiro Needle. Duran  Reason for consult:  Evaluate for multiple sclerosis  HISTORY OF PRESENT ILLNESS: Sabrina Roberson is a 43 year old right-handed woman with history of thyroid disease, fibromyalgia, chronic fatigue syndrome, superior mesenteric artery syndrome, anxiety, hypertension, who presents for an evaluation of multiple sclerosis.  She is accompanied by her mother.  Her past medical records are not available to review at this time.  History obtained by patient, her mother and one PCP office visit note.  In 2007, she began having migraine headaches.  She also had intermittent episodes of right facial droop and right arm weakness lasting several days, sometimes associated with headache.  She began having multiple symptomatology, including ataxia, slurred speech, incoordination, lip numbness, loss of muscle control, and both kinetic and resting tremor.  She also reports cognitive problems, both short-term memory and remote memory.  Sometimes, she is not able to recall her age or birthday.  She sometimes goes through spells where she will start speaking baby talk.  She is unaware of this.  The ataxia was gradual onset over 2 years, and would occur intermittently, with episodes occurring daily for several weeks.  Associated symptoms include headache, vertigo, numbness of the extremities, spasticity and tinnitus.  Symptoms are exacerbated by stress, fatigue, emotional stress, exertion, sunlight and initiation of movement.  She reports mild urinary incontinence and constipation.  She exhibits spells of laughing and crying, as well as other mood swings and depression.  She reportedly had several MRIs of the head and MRAs of the head and neck, which were unremarkable.  TEE with bubble study was negative.  Telemetry was negative for atrial fibrillation.  Hypercoagulable workup  was negative.  Carotid doppler was reportedly negative.  A lumbar puncture was performed, which was unremarkable except for mildly elevated protein.  She reportedly had EEG which was unremarkable for seizure activity or epileptiform discharges, although it showed "muscle jerkness".  She is married with two sons (41 and 110 years old).  She is on disability and has not worked since 2014.  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  She is able to do some house work.  She does not drive.  Differential diagnosis has been varied, including TIA, hemiplegic migraines, seizures, MS or psychogenic.  She was taking Aggrenox, which was discontinued due to bruising.  She currently takes levothyroxine, propranolol ER 120mg , HCTZ, promethazine 25mg , diazepam 15mg , Crestor, Ventolin, Fetzima, and omeprazole.  There is no family history of similar condition.  Her maternal aunt has Parkinson's disease.  She had an MRI of the brain with and without contrast performed on 09/03/13, which revealed very mild non-specific white matter hyperintensities without abnormal enhancement.  MRI of the cervical spine revealed no signal abnormality in the spinal cord.  Stable compared to studies from 07/07/12 and 02/09/11.  A bilateral carotid and vertebral angiogram was performed on 11/17/09, which showed 95% plus segmental stenosis of the left ICA at the bulb, 90% stenosis at the origin of the left vertebral artery with 95% stenosis of the left verebrovasilar junction at the level of the posterior arch of C1.  MRA of the head and neck performed 02/09/11 were unremarkable, however.   PAST MEDICAL HISTORY: Past Medical History  Diagnosis Date  . Allergy   . Anxiety   . Asthma   . GERD (gastroesophageal reflux  disease)   . Hypertension   . Thyroid disease   . TIA (transient ischemic attack)   . Hearing loss   . Bruises easily   . Leg swelling     both  . Cough   . Fibromyalgia       PAST SURGICAL HISTORY: Past Surgical History  Procedure Laterality Date  . Cholecystectomy    . Appendectomy    . Abdominal hysterectomy    . Tee without cardioversion N/A 06/18/2012    Procedure: TRANSESOPHAGEAL ECHOCARDIOGRAM (TEE);  Surgeon: Thurmon Fair, MD;  Location: [facility] ENDOSCOPY;  Service: Cardiovascular;  Laterality: N/A;  . 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 Hernia repair      MEDICATIONS: Current Outpatient Prescriptions on File Prior to Visit  Medication Sig Dispense Refill  . albuterol (PROVENTIL HFA;VENTOLIN HFA) 108 (90 BASE) MCG/ACT inhaler Inhale 2 puffs into the lungs every 6 (six) hours as needed. For shortness of breath      . diazepam (VALIUM) 5 MG tablet Take 7.5 mg by mouth 2 (two) times daily.       . furosemide (LASIX) 20 MG tablet Take 1 tablet (20 mg total) by mouth daily.  10 tablet  0  . hydrochlorothiazide (HYDRODIURIL) 12.5 MG tablet Take 1 tablet (12.5 mg total) by mouth daily.  30 tablet  0  . ibuprofen (ADVIL,MOTRIN) 200 MG tablet Take 800 mg by mouth every 6 (six) hours as needed for mild pain.      Marland Kitchen levothyroxine (SYNTHROID, LEVOTHROID) 88 MCG tablet Take 88 mcg by mouth daily.      . promethazine (PHENERGAN) 25 MG tablet Take 25 mg by mouth every 6 (six) hours as needed for nausea or vomiting.      . propranolol (INDERAL LA) 80 MG 24 hr capsule Take 80 mg by mouth daily.        No current facility-administered medications on file prior to visit.    ALLERGIES: Allergies  Allergen Reactions  . Codeine Nausea And Vomiting    FAMILY HISTORY: Family History  Problem Relation Age of Onset  . Hypertension Sister   . Cancer Paternal Grandmother     breast  . Pulmonary fibrosis Maternal Grandmother   . Leukemia Paternal Grandfather   . Parkinson's disease Maternal Aunt     SOCIAL HISTORY: History   Social History  . Marital Status: Married    Spouse Name: N/A    Number of Children: N/A  . Years  of Education: N/A   Occupational History  . Not on file.   Social History Main Topics  . Smoking status: Current Every Day Smoker  . Smokeless tobacco: Not on file     Comment: 8 cig a day  . Alcohol Use: No  . Drug Use: No  . Sexual Activity: Not on file   Other Topics Concern  . Not on file   Social History Narrative  . No narrative on file    REVIEW OF SYSTEMS: Constitutional: Fatigue.  No fevers, chills, or sweats, no generalized fatigue, change in appetite Eyes: No visual changes, double vision, eye pain Ear, nose and throat: No hearing loss, ear pain, nasal congestion, sore throat Cardiovascular: No chest pain, palpitations Respiratory:  No shortness of breath at rest or with exertion, wheezes GastrointestinaI: Constipation Genitourinary:  Stress incontinence Musculoskeletal:  No neck pain, back pain Integumentary: Bruising Neurological: as above Psychiatric: Depression, anxiety Endocrine: No palpitations, fatigue, diaphoresis, mood swings, change in  appetite, change in weight, increased thirst Hematologic/Lymphatic:  bruising Allergic/Immunologic: no itchy/runny eyes, nasal congestion, recent allergic reactions, rashes  PHYSICAL EXAM: Filed Vitals:   11/16/13 1326  BP: 100/60  Pulse: 88   General: No acute distress Head:  Normocephalic/atraumatic Neck: supple, no paraspinal tenderness, full range of motion Back: No paraspinal tenderness Heart: regular rate and rhythm Lungs: Clear to auscultation bilaterally. Vascular: No carotid bruits. Neurological Exam: Mental status: alert and oriented to self only, recent memory poor, and remote memory inconsistent, fund of knowledge, attention and concentration poor, speech variable (sometimes slowed, sometimes stutters,sometimes with forced verbal output), language appears intact as she is able to understand me, but naming was incorrect. Montreal Cognitive Assessment  11/16/2013  Visuospatial/ Executive (0/5) 1   Naming (0/3) 0  Attention: Read list of digits (0/2) 0  Attention: Read list of letters (0/1) 0  Attention: Serial 7 subtraction starting at 100 (0/3) 0  Language: Repeat phrase (0/2) 0  Language : Fluency (0/1) 0  Abstraction (0/2) 1  Delayed Recall (0/5) 0  Orientation (0/6) 1  Total 3  Adjusted Score (based on education) 4   Cranial nerves: CN I: not tested CN II: pupils equal, round and reactive to light, visual fields intact, fundi unremarkable, without vessel changes, exudates, hemorrhages or papilledema. CN III, IV, VI:  full range of motion, no nystagmus, no ptosis CN V: endorses reduced sensation on the right side of face (V1-V3), splitting the midline.  Endorses reduced vibration sensation to tuning fork on the right side of her forehead. CN VII: upper and lower face symmetric CN VIII: hearing intact CN IX, X: gag intact, uvula midline CN XI: sternocleidomastoid and trapezius muscles intact CN XII: tongue midline Bulk & Tone: normal, no fasciculations. Motor: 5/5 throughout Sensation: endorses reduced pinprick sensation on right upper and lower extremities, and right side of body splitting the midline. Deep Tendon Reflexes: 2+ throughout, toes downgoing. Finger to nose testing: with postural tremor and unusual looking ataxia Heel to shin: No dysmetria with left heel to right shin.  Difficulty with right heel to left shin. Gait: wide based, steady but then will stumble.  Will sway to either side at times.  Takes multiple steps to turn around.  Unable to walk on toes and heels.  Able to walk in tandem. Romberg negative.  IMPRESSION: Cognitive deficits Gait instability Unilateral numbness Speech impairment Pseudobulbar affect Tremor  Difficult to make an assessment, as I do not have access to her past records.  It sounds like she has had episodes of right facial droop in the past.  However, her current symptoms to me don't quite seem organic.  Some of her symptoms were  inconsistent, such as her speech difficulty.  Her speech would sound more clear and fluent than at other times.  Also, she sometimes exhibited stuttering, which is typically non-organic when newly-onset in an adult.  When testing her sensation, she split the midline.  Also, she endorsed reduced vibratory sensation of the tuning fork over the right side of her forehead, which is also not organic.  Her gait did not look like a pathological ataxic gait as she exhibited various changes in her stride while she walked.  The dysmetria on finger to nose testing also did not seem organic.  She also scored extremely low on the [facility] test, which correlates with severe cognitive impairment, which appears out of proportion to her functional capacity.  I don't think she has multiple sclerosis,however.  PLAN: I cannot make  any further recommendations for testing until I review her extensive medical records.  She will follow up after I review them.  45 minutes spent with patient, over 50% spent discussing etiology and plan.  Thank you for allowing me to take part in the care of this patient.  Shon Millet, DO  CC:  Roxanne Mins

## 2013-11-30 ENCOUNTER — Ambulatory Visit (INDEPENDENT_AMBULATORY_CARE_PROVIDER_SITE_OTHER): Payer: Managed Care, Other (non HMO) | Admitting: Neurology

## 2013-11-30 ENCOUNTER — Encounter: Payer: Self-pay | Admitting: Neurology

## 2013-11-30 VITALS — BP 112/70 | HR 88 | Resp 16 | Wt 176.5 lb

## 2013-11-30 DIAGNOSIS — R519 Headache, unspecified: Secondary | ICD-10-CM

## 2013-11-30 DIAGNOSIS — F919 Conduct disorder, unspecified: Secondary | ICD-10-CM

## 2013-11-30 DIAGNOSIS — R51 Headache: Secondary | ICD-10-CM

## 2013-11-30 DIAGNOSIS — F32A Depression, unspecified: Secondary | ICD-10-CM

## 2013-11-30 DIAGNOSIS — R251 Tremor, unspecified: Secondary | ICD-10-CM

## 2013-11-30 DIAGNOSIS — R479 Unspecified speech disturbances: Secondary | ICD-10-CM

## 2013-11-30 DIAGNOSIS — F329 Major depressive disorder, single episode, unspecified: Secondary | ICD-10-CM

## 2013-11-30 DIAGNOSIS — R4689 Other symptoms and signs involving appearance and behavior: Principal | ICD-10-CM

## 2013-11-30 DIAGNOSIS — R4189 Other symptoms and signs involving cognitive functions and awareness: Secondary | ICD-10-CM

## 2013-11-30 NOTE — Patient Instructions (Signed)
Based on reviewing the records, prior tests and my exam from last visit, I cannot find a neurologic cause for your symptoms.  I think it may be related to anxiety.  I think you should consider seeing a psychiatrist and getting counseling, as it would be helpful.  You should feel relieved that it is not a serious neurologic condition such as MS.  We can repeat neuropsychological testing if you would like.  Otherwise, I would be happy to see you as needed.

## 2013-11-30 NOTE — Progress Notes (Signed)
NEUROLOGY FOLLOW UP OFFICE NOTE  Katherene PontoSallie A Grosch [id number]  HISTORY OF PRESENT ILLNESS: Sabrina Roberson is a 43 year old right-handed woman with history of thyroid disease, fibromyalgia, chronic fatigue syndrome, superior mesenteric artery syndrome, anxiety, hypertension, who follows up for an evaluation of multiple sclerosis.  She is accompanied by her mother.    Since last visit, I was able to review her past records.  In 2007, she began having migraine headaches.  She also had intermittent episodes of right facial droop and right arm weakness lasting several days, sometimes associated with headache.  She began having multiple symptomatology, including ataxia, slurred speech, incoordination, lip numbness, loss of muscle control, and both kinetic and resting tremor.  She also reports cognitive problems, both short-term memory and remote memory.  Sometimes, she is not able to recall her age or birthday.  She sometimes goes through spells where she will start speaking baby talk.  She is unaware of this.  The ataxia was gradual onset over 2 years, and would occur intermittently, with episodes occurring daily for several weeks.  Associated symptoms include headache, vertigo, numbness of the extremities, spasticity and tinnitus.  Symptoms are exacerbated by stress, fatigue, emotional stress, exertion, sunlight and initiation of movement.  She reports mild urinary incontinence and constipation.  She exhibits spells of laughing and crying, as well as other mood swings and depression.  She reportedly had several MRIs of the head and MRAs of the head and neck, which were unremarkable.  TEE with bubble study was negative.  Telemetry was negative for atrial fibrillation.  Hypercoagulable workup was negative.  Carotid doppler was reportedly negative.  A lumbar puncture was performed, which was unremarkable except for mildly elevated protein.  She reportedly had EEG which was unremarkable for seizure activity or  epileptiform discharges, although it showed "muscle jerkness".  She is married with two sons (7116 and 41 years old).  She is on disability and has not worked since 2014.  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  She is able to do some house work.  She does not drive.  Differential diagnosis has been varied, including TIA, hemiplegic migraines, seizures, MS or psychogenic.  She was taking Aggrenox, which was discontinued due to bruising.  She currently takes levothyroxine, propranolol ER 120mg , HCTZ, promethazine 25mg , diazepam 15mg , Crestor, Ventolin, Fetzima, and omeprazole.  There is no family history of similar condition.  Her maternal aunt has Parkinson's disease.  A bilateral carotid and vertebral angiogram was performed on 11/17/09, which showed 95% plus segmental stenosis of the left ICA at the bulb, 90% stenosis at the origin of the left vertebral artery with 95% stenosis of the left verebrovasilar junction at the level of the posterior arch of C1.  , however.  In 2001, she began having peri-orbital headaches associated with eyelid heaviness.  She began experiencing increased dizziness and pre-syncopal spells.  She established care with Dr. Marlena Clipperavid Meyer at [facility] and was diagnosed with probable migraine.  She had an MRI of the brain performed in 2003, which was reportedly normal.  In 2007, she began experiencing other symptoms, specifically frequent episodes of dizziness, blurred vision, black spots in her vision, chest tightness, palpitations, arm pain, right arm weakness and slurred speech, associated with headache.  It was noted by others that she would sometimes look pale and exhibited right facial weakness.  It was suspected that she likely was having hemiplegic migraines.  She was started on Aggrenox.  MRI and MRA of the head performed 01/10/06 were normal.  She then began having bilateral tremors, right worse than  left.  Sometimes the tremors would be violent.  She had another MRI of the brain performed on 04/04/06 to further evaluate these new symptoms.  The report now mentioned some scattered non-specific white matter hyperintensities in the periventricular white matter, some ovoid.  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  Over the next 3 months, she developed increased dizziness, and loss of balance, as well as increased tremor.  The tremor reportedly was more prominent in bright sunlight.  She also was becoming more irritable and agitated.  Lab work from 07/04/06, including CBC, CMP and thyroid panel, was negative.  EEG from 07/18/06 was normal.  BAER from 07/18/06 was normal.  She had another MRI of the brain performed 07/18/06, which was normal without any signal abnormalities.  Trembling spread to her legs, arms and jaw.  Primidone was switched to propranolol.  Over the next year, she began experiencing peri-oral numbness as well.  She continued to have recurrent episodes of headache and right facial weakness.  There was concern for recurrent TIAs with complicated migraines.  Over the next few years, she would have recurrent episodes, including having migraines without headache, described as imbalance, numbness of the left arm, slurred speech, peri-oral numbness, and twisting of her mouth.  She began to have increased difficulty with concentration and ability to perform her duties at work.  This was associated with increased generalized numbness, tremor and gait imbalance.  Due to recurrent episodes, she has had multiple other MRIs of the brain (06/26/07, 01/26/09, 11/23/09, 12/19/10), as well as other MRAs of the head (11/23/09, 12/19/10) all which were normal.  Hypercoagulable workup was reportedly unremarkable.  She was switched to Plavix.  EEG performed 11/23/09 was normal.  She apparently had psychometric testing, which was reportedly normal and dysfunction likely related to  stress.  She developed multiple areas of pain in her joints and was diagnosed with fibromyalgia.  In 2014, she began having increased tremor in her legs, affecting her speech, as well as jerking spells and dimming of her vision when she is out in bright light.  This is followed by fatigue.  She had another EEG on 09/16/12, which captured a spell without electrographic correlate, diagnosing non-epileptic spells.  On 10/14/12, she underwent a lumbar puncture.  Except for slightly elevated protein of 70.6, CSF results were unremarkable with cell count was 2, glucose 61, Lyme negative, oligoclonal bands 0, EBV profile negative, IgG index normal, and myelin basic protein 0.4.   Due to progressed symptoms, she had another MRI of the brain with and without contrast performed on 09/03/13, which revealed very mild non-specific white matter hyperintensities without abnormal enhancement.  MRI of the cervical spine revealed no signal abnormality in the spinal cord.  Stable compared to studies from 07/07/12 and 02/09/11.   MRA of the head and neck performed 02/09/11 were unremarkable  Her exam included numbness, tremor, slurred speech and poor coordination.  Prior notes also mentioned diagnosis of spontaneous intracranial hypotension, but there is no actual discussion or testing about this diagnosis.  PAST MEDICAL HISTORY: Past Medical History  Diagnosis Date  . Allergy   . Anxiety   . Asthma   . GERD (gastroesophageal reflux disease)   . Hypertension   . Thyroid disease   . TIA (transient ischemic attack)   .  Hearing loss   . Bruises easily   . Leg swelling     both  . Cough   . Fibromyalgia     MEDICATIONS: Current Outpatient Prescriptions on File Prior to Visit  Medication Sig Dispense Refill  . albuterol (PROVENTIL HFA;VENTOLIN HFA) 108 (90 BASE) MCG/ACT inhaler Inhale 2 puffs into the lungs every 6 (six) hours as needed. For shortness of breath    . diazepam (VALIUM) 5 MG tablet Take 7.5 mg by mouth 2  (two) times daily.     . furosemide (LASIX) 20 MG tablet Take 1 tablet (20 mg total) by mouth daily. 10 tablet 0  . hydrochlorothiazide (HYDRODIURIL) 12.5 MG tablet Take 1 tablet (12.5 mg total) by mouth daily. 30 tablet 0  . ibuprofen (ADVIL,MOTRIN) 200 MG tablet Take 800 mg by mouth every 6 (six) hours as needed for mild pain.    Marland Kitchen. levothyroxine (SYNTHROID, LEVOTHROID) 88 MCG tablet Take 88 mcg by mouth daily.    Marland Kitchen. oxycodone (OXY-IR) 5 MG capsule Take 5 mg by mouth every 6 (six) hours as needed.    . promethazine (PHENERGAN) 25 MG tablet Take 25 mg by mouth every 6 (six) hours as needed for nausea or vomiting.    . propranolol (INDERAL LA) 80 MG 24 hr capsule Take 80 mg by mouth daily.      No current facility-administered medications on file prior to visit.    ALLERGIES: Allergies  Allergen Reactions  . Codeine Nausea And Vomiting    FAMILY HISTORY: Family History  Problem Relation Age of Onset  . Hypertension Sister   . Cancer Paternal Grandmother     breast  . Pulmonary fibrosis Maternal Grandmother   . Leukemia Paternal Grandfather   . Parkinson's disease Maternal Aunt     SOCIAL HISTORY: History   Social History  . Marital Status: Married    Spouse Name: N/A    Number of Children: N/A  . Years of Education: N/A   Occupational History  . Not on file.   Social History Main Topics  . Smoking status: Current Every Day Smoker  . Smokeless tobacco: Not on file     Comment: 8 cig a day  . Alcohol Use: No  . Drug Use: No  . Sexual Activity:    Partners: Male   Other Topics Concern  . Not on file   Social History Narrative    REVIEW OF SYSTEMS: Constitutional: No fevers, chills, or sweats, no generalized fatigue, change in appetite Eyes: No visual changes, double vision, eye pain Ear, nose and throat: No hearing loss, ear pain, nasal congestion, sore throat Cardiovascular: No chest pain, palpitations Respiratory:  No shortness of breath at rest or with  exertion, wheezes GastrointestinaI: No nausea, vomiting, diarrhea, abdominal pain, fecal incontinence Genitourinary:  No dysuria, urinary retention or frequency Musculoskeletal:  No neck pain, back pain Integumentary: No rash, pruritus, skin lesions Neurological: as above Psychiatric: No depression, insomnia, anxiety Endocrine: No palpitations, fatigue, diaphoresis, mood swings, change in appetite, change in weight, increased thirst Hematologic/Lymphatic:  No anemia, purpura, petechiae. Allergic/Immunologic: no itchy/runny eyes, nasal congestion, recent allergic reactions, rashes  PHYSICAL EXAM: Filed Vitals:   11/30/13 1137  BP: 112/70  Pulse: 88  Resp: 16   General: No acute distress Head:  Normocephalic/atraumatic  IMPRESSION: This is a young woman who has had multiple neurologic symptoms over the past several years.  She has had a thorough neurological work up, including multiple EEGs, multiple MRIs, formal neuropsychological testing, blood  work and CSF analysis, all unremarkable.  On exam, she presented with inconsistent symptoms which seemed non-organic, such as fluctuation in speech output, inconsistent cognitive testing which also seemed out of proportion to her presentation, and splitting the midline on sensory testing.  I do not think she has multiple sclerosis.  I am inclined to believe that her symptoms are non-organic.  The only test that I would consider repeating would be neuropsychological testing, but she would not like to pursue that.  I believe that she should receive treatment and counseling by a psychiatrist and psychologist.  20 minutes spent with the patient and her mother, 100% spent discussing my impression and recommended treatment.  I answered all questions to the best of my ability.  Shon Millet, DO  CC:  Roxanne Mins

## 2013-12-01 ENCOUNTER — Other Ambulatory Visit: Payer: Self-pay | Admitting: Internal Medicine

## 2013-12-01 ENCOUNTER — Ambulatory Visit (INDEPENDENT_AMBULATORY_CARE_PROVIDER_SITE_OTHER): Payer: Managed Care, Other (non HMO) | Admitting: Internal Medicine

## 2013-12-01 DIAGNOSIS — R0602 Shortness of breath: Secondary | ICD-10-CM

## 2013-12-01 LAB — PULMONARY FUNCTION TEST
FEF 25-75 Pre: 2.97 L/sec
FEF2575-%PRED-PRE: 96 %
FEV1-%PRED-PRE: 75 %
FEV1-Pre: 2.26 L
FEV1FVC-%Pred-Pre: 106 %
FEV6-%Pred-Pre: 72 %
FEV6-PRE: 2.6 L
FEV6FVC-%Pred-Pre: 102 %
FVC-%PRED-PRE: 70 %
FVC-PRE: 2.6 L
PRE FEV6/FVC RATIO: 100 %
Pre FEV1/FVC ratio: 87 %

## 2013-12-01 NOTE — Progress Notes (Signed)
PFT done today. Sabrina Roberson,CMA  

## 2013-12-20 ENCOUNTER — Encounter: Payer: Self-pay | Admitting: Neurology

## 2013-12-21 ENCOUNTER — Ambulatory Visit (INDEPENDENT_AMBULATORY_CARE_PROVIDER_SITE_OTHER): Payer: Managed Care, Other (non HMO) | Admitting: Pulmonary Disease

## 2013-12-21 ENCOUNTER — Encounter: Payer: Self-pay | Admitting: Pulmonary Disease

## 2013-12-21 VITALS — BP 112/68 | HR 73 | Temp 99.0°F | Ht 64.0 in | Wt 179.8 lb

## 2013-12-21 DIAGNOSIS — R0609 Other forms of dyspnea: Secondary | ICD-10-CM

## 2013-12-21 NOTE — Patient Instructions (Signed)
Will schedule for methacholine challenge test to answer the question about possible asthma. Work on weight loss and conditioning.

## 2013-12-21 NOTE — Progress Notes (Signed)
Subjective:    Patient ID: Katherene Ponto, female    DOB: 11-09-1970, 43 y.o.   MRN: [id number]  HPI The patient is a 43 year old female who I've been asked to see for dyspnea on exertion and atypical chest discomfort. She states that she has had long-standing dyspnea on exertion, but this summer she began to notice a more rapid worsening. She also developed chest discomfort that starts in her sternal area, goes through to the back, and also migrates to the right side. It is not pleuritic in nature or pressure like, and is made worse with twisting, carrying loads, deep inspiration, and laughing. She currently describes shortness of breath bringing groceries in from the car, going up one flight of stairs. She has minimal dry cough. The patient states that she has intermittent lower extremity edema, but it currently is not a problem. She does have a history of asthma as a child, but it has not been an issue during adulthood.  She has had a cardiac evaluation that was unremarkable, and had a chest CT in August that showed only minimal linear atelectasis in the right middle lobe and lingula. She also had pulmonary function studies this month which showed no airflow obstruction, but she was unable to do lung volumes or diffusion capacity.  Of note, the patient tells me that she has gained over 30 pounds in the last one year.   Review of Systems  Constitutional: Positive for appetite change and unexpected weight change. Negative for fever.  HENT: Positive for dental problem and trouble swallowing. Negative for congestion, ear pain, nosebleeds, postnasal drip, rhinorrhea, sinus pressure, sneezing and sore throat.   Eyes: Negative for redness and itching.  Respiratory: Positive for cough and shortness of breath. Negative for chest tightness and wheezing.   Cardiovascular: Positive for chest pain and leg swelling. Negative for palpitations.  Gastrointestinal: Positive for abdominal pain. Negative for nausea  and vomiting.  Genitourinary: Negative for dysuria.  Musculoskeletal: Positive for arthralgias. Negative for joint swelling.  Skin: Negative for rash.  Neurological: Negative for headaches.  Hematological: Does not bruise/bleed easily.  Psychiatric/Behavioral: Positive for dysphoric mood. The patient is nervous/anxious.        Objective:   Physical Exam Constitutional:  Obese female, no acute distress  HENT:  Nares patent without discharge  Oropharynx without exudate, palate and uvula are normal  Eyes:  Perrla, eomi, no scleral icterus  Neck:  No JVD, no TMG  Cardiovascular:  Normal rate, regular rhythm, no rubs or gallops.  No murmurs        Intact distal pulses  Pulmonary :  Normal breath sounds, no stridor or respiratory distress   No rales, rhonchi, or wheezing  Abdominal:  Soft, nondistended, bowel sounds present.  No tenderness noted.   Musculoskeletal:  No lower extremity edema noted.  Lymph Nodes:  No cervical lymphadenopathy noted  Skin:  No cyanosis noted  Neurologic:  Alert, appropriate, moves all 4 extremities without obvious deficit.         Assessment & Plan:

## 2013-12-21 NOTE — Assessment & Plan Note (Signed)
The patient has worsening dyspnea on exertion of unknown origin, and her recent chest CT and function studies are unremarkable. She does have a history of asthma as a child, and I have explained that a lack of obstruction on spirometry currently does not exclude the possibility of asthma. I would like to do a methacholine challenge test to put the issue of asthma to rest, and the patient is agreeable. Her chest CT was not ordered to look for pulmonary emboli, however she has no history that is suggestive of thromboembolic disease. This can certainly be kept in mind going forward if she does not improve. More than likely her dyspnea on exertion is secondary to a 30+ pound weight gain over the last one year, along with deconditioning. Regarding her chest discomfort, this sounds more musculoskeletal than anything else, and there is no obvious pulmonary diagnosis to explain her current symptoms.

## 2013-12-28 ENCOUNTER — Ambulatory Visit (HOSPITAL_COMMUNITY)
Admission: RE | Admit: 2013-12-28 | Discharge: 2013-12-28 | Disposition: A | Discharge status: Home / Self Care | Payer: Managed Care, Other (non HMO) | Source: Ambulatory Visit | Attending: Pulmonary Disease | Admitting: Pulmonary Disease

## 2013-12-28 DIAGNOSIS — R0609 Other forms of dyspnea: Secondary | ICD-10-CM | POA: Diagnosis present

## 2013-12-28 LAB — PULMONARY FUNCTION TEST
FEF 25-75 Post: 2.15 L/sec
FEF 25-75 Pre: 3.1 L/sec
FEF2575-%Change-Post: -30 %
FEF2575-%Pred-Post: 69 %
FEF2575-%Pred-Pre: 100 %
FEV1-%Change-Post: -6 %
FEV1-%Pred-Post: 87 %
FEV1-%Pred-Pre: 93 %
FEV1-Post: 2.61 L
FEV1-Pre: 2.79 L
FEV1FVC-%CHANGE-POST: 0 %
FEV1FVC-%Pred-Pre: 99 %
FEV6-%CHANGE-POST: -4 %
FEV6-%PRED-PRE: 93 %
FEV6-%Pred-Post: 88 %
FEV6-Post: 3.21 L
FEV6-Pre: 3.37 L
FEV6FVC-%PRED-POST: 102 %
FEV6FVC-%PRED-PRE: 102 %
FVC-%Change-Post: -6 %
FVC-%PRED-POST: 87 %
FVC-%Pred-Pre: 93 %
FVC-PRE: 3.44 L
FVC-Post: 3.21 L
Post FEV1/FVC ratio: 81 %
Post FEV6/FVC ratio: 100 %
Pre FEV1/FVC ratio: 81 %
Pre FEV6/FVC Ratio: 100 %

## 2013-12-28 MED ORDER — METHACHOLINE 4 MG/ML NEB SOLN
2.0000 mL | Freq: Once | RESPIRATORY_TRACT | Status: AC
  Administered 2013-12-28: 8 mg via RESPIRATORY_TRACT

## 2013-12-28 MED ORDER — METHACHOLINE 0.0625 MG/ML NEB SOLN
2.0000 mL | Freq: Once | RESPIRATORY_TRACT | Status: AC
  Administered 2013-12-28: 0.125 mg via RESPIRATORY_TRACT

## 2013-12-28 MED ORDER — SODIUM CHLORIDE 0.9 % IN NEBU
3.0000 mL | INHALATION_SOLUTION | Freq: Once | RESPIRATORY_TRACT | Status: AC
  Administered 2013-12-28: 3 mL via RESPIRATORY_TRACT

## 2013-12-28 MED ORDER — METHACHOLINE 0.25 MG/ML NEB SOLN
2.0000 mL | Freq: Once | RESPIRATORY_TRACT | Status: AC
  Administered 2013-12-28: 0.5 mg via RESPIRATORY_TRACT

## 2013-12-28 MED ORDER — ALBUTEROL SULFATE (2.5 MG/3ML) 0.083% IN NEBU
2.5000 mg | INHALATION_SOLUTION | Freq: Once | RESPIRATORY_TRACT | Status: AC
  Administered 2013-12-28: 2.5 mg via RESPIRATORY_TRACT

## 2013-12-28 MED ORDER — METHACHOLINE 16 MG/ML NEB SOLN
2.0000 mL | Freq: Once | RESPIRATORY_TRACT | Status: AC
  Administered 2013-12-28: 32 mg via RESPIRATORY_TRACT

## 2013-12-28 MED ORDER — METHACHOLINE 1 MG/ML NEB SOLN
2.0000 mL | Freq: Once | RESPIRATORY_TRACT | Status: AC
  Administered 2013-12-28: 2 mg via RESPIRATORY_TRACT

## 2014-01-04 ENCOUNTER — Telehealth: Payer: Self-pay | Admitting: Pulmonary Disease

## 2014-01-04 NOTE — Telephone Encounter (Signed)
Pt had methacholine challenge test done 12/28/13. Please advise KC thanks

## 2014-01-04 NOTE — Telephone Encounter (Signed)
Please let pt know that her methacholine challenge test was normal. There is no asthma present.  I would like her to work on an exercise program and weight loss, and if her breathing does not improve, she needs to call me.

## 2014-01-04 NOTE — Telephone Encounter (Signed)
lmomtcb x1 for pt 

## 2014-01-05 NOTE — Telephone Encounter (Signed)
Called and spoke to pt. Informed pt of the results per KC. Pt verbalized understanding and denied any further questions or concerns at this time.  

## 2014-01-05 NOTE — Telephone Encounter (Signed)
[phone] returning call

## 2014-01-11 ENCOUNTER — Other Ambulatory Visit: Payer: Self-pay | Admitting: Cardiology

## 2014-01-11 DIAGNOSIS — N644 Mastodynia: Secondary | ICD-10-CM

## 2014-01-11 DIAGNOSIS — IMO0002 Reserved for concepts with insufficient information to code with codable children: Secondary | ICD-10-CM

## 2014-01-25 ENCOUNTER — Ambulatory Visit
Admission: RE | Admit: 2014-01-25 | Discharge: 2014-01-25 | Disposition: A | Discharge status: Home / Self Care | Payer: Managed Care, Other (non HMO) | Source: Ambulatory Visit | Attending: Cardiology | Admitting: Cardiology

## 2014-01-25 DIAGNOSIS — IMO0002 Reserved for concepts with insufficient information to code with codable children: Secondary | ICD-10-CM

## 2014-01-25 DIAGNOSIS — N644 Mastodynia: Secondary | ICD-10-CM

## 2014-02-04 ENCOUNTER — Encounter: Payer: Self-pay | Admitting: Pulmonary Disease

## 2014-02-04 ENCOUNTER — Ambulatory Visit (INDEPENDENT_AMBULATORY_CARE_PROVIDER_SITE_OTHER): Payer: Managed Care, Other (non HMO) | Admitting: Pulmonary Disease

## 2014-02-04 VITALS — BP 126/68 | HR 70 | Temp 97.7°F | Ht 64.0 in | Wt 180.8 lb

## 2014-02-04 DIAGNOSIS — R9389 Abnormal findings on diagnostic imaging of other specified body structures: Secondary | ICD-10-CM | POA: Insufficient documentation

## 2014-02-04 DIAGNOSIS — R938 Abnormal findings on diagnostic imaging of other specified body structures: Secondary | ICD-10-CM

## 2014-02-04 DIAGNOSIS — R0609 Other forms of dyspnea: Secondary | ICD-10-CM

## 2014-02-04 HISTORY — DX: Abnormal findings on diagnostic imaging of other specified body structures: R93.89

## 2014-02-04 MED ORDER — BUPROPION HCL ER (SMOKING DET) 150 MG PO TB12: ORAL_TABLET | ORAL | Status: DC

## 2014-02-04 NOTE — Progress Notes (Signed)
Subjective:    Patient ID: Sabrina Roberson, female    DOB: 1970/12/15, 44 y.o.   MRN: [id number]  HPI The patient comes in today for an acute sick visit. I have seen her in the past for dyspnea on exertion, and did not find anything from a pulmonary standpoint. She did have some subsegmental atelectasis in the right middle lobe and lingula on his CT in August 2015. She comes in today where she has developed some right pleuritic chest pain, and underwent a CT and GU for evaluation. She did not have a pulmonary embolus, and the only thing found was an abnormal density in the lingula. The images are not available for my review, and therefore it is unclear if it is similar to her CT last year or different. Her chest discomfort is worsened with movement and taking a deep breath. It should be noted that she continues to smoke.   Review of Systems  Constitutional: Negative for fever and unexpected weight change.  HENT: Negative for congestion, dental problem, ear pain, nosebleeds, postnasal drip, rhinorrhea, sinus pressure, sneezing, sore throat and trouble swallowing.   Eyes: Negative for redness and itching.  Respiratory: Positive for cough, chest tightness, shortness of breath and wheezing.   Cardiovascular: Negative for palpitations and leg swelling.  Gastrointestinal: Negative for nausea and vomiting.  Genitourinary: Negative for dysuria.  Musculoskeletal: Negative for joint swelling.  Skin: Negative for rash.  Neurological: Negative for headaches.  Hematological: Does not bruise/bleed easily.  Psychiatric/Behavioral: Negative for dysphoric mood. The patient is not nervous/anxious.        Objective:   Physical Exam Well-developed female in no acute distress Nose without purulence or discharge noted Neck without lymphadenopathy or thyromegaly Chest is clear to auscultation Cardiac exam with regular rate and rhythm Lower extremities without edema, no cyanosis Alert and oriented, moves all  4 extremities.       Assessment & Plan:

## 2014-02-04 NOTE — Assessment & Plan Note (Signed)
The patient's most recent CT shows an abnormal density in the lingula of unknown significance. The images are not available for my review, and I have told both the patient and her family member she needs to bring a disc to the office so that I can compare to the scan from last year. Be noted that she had atelectasis in the lingula last year, and this may be the explanation for her current findings. The patient is having some right-sided chest discomfort that sounds musculoskeletal, and is worsened with movement and deep inspiration. Of note, there was no evidence for a pulmonary embolus on her recent CT angiogram. Based on her actual CT images, we can decide about surveillance versus intervention.

## 2014-02-04 NOTE — Patient Instructions (Signed)
Will need you to pick up a copy of your recent chest ct on disk for my review.  Please drop off at our desk, and I will call you as soon as we receive the study. Depending upon review, we will either watch this over time, but may have to evaluate further depending upon appearance. You need to work on stopping smoking.

## 2014-02-04 NOTE — Addendum Note (Signed)
Addended by: Tommie SamsSILVA, Shylee Durrett S on: 02/04/2014 01:22 PM   Modules accepted: Orders

## 2014-02-08 ENCOUNTER — Telehealth: Payer: Self-pay | Admitting: Pulmonary Disease

## 2014-02-08 DIAGNOSIS — R9389 Abnormal findings on diagnostic imaging of other specified body structures: Secondary | ICD-10-CM

## 2014-02-08 NOTE — Telephone Encounter (Signed)
Pt states that she was unable to get a copy of [facility] CT [facility] [phone]) Requests that we call [facility] and request this be mailed to Dr Shelle Ironlance.  Spoke with operator, States that we will need to call back in the morning after 8am to speak with Sana in the radiology dept to request this disc. Her hours are 8-5.  Will hold in triage to call in AM

## 2014-02-08 NOTE — Telephone Encounter (Signed)
LMTCB

## 2014-02-08 NOTE — Telephone Encounter (Signed)
Pt returning call.Sabrina Roberson ° °

## 2014-02-09 NOTE — Telephone Encounter (Signed)
Blake DivineShauna calling back from [facility] [phone] ext [phone]

## 2014-02-09 NOTE — Telephone Encounter (Signed)
lmtcb for Shauna.

## 2014-02-09 NOTE — Telephone Encounter (Signed)
Called spoke IranShauna. She is going to get this placed on disk and mailed to our office. Will hold in my inbox to f/u on. Per shauna this should be mailed out within the next day or 2.

## 2014-02-15 NOTE — Telephone Encounter (Signed)
[facility] Please advise thanks

## 2014-02-15 NOTE — Telephone Encounter (Signed)
I have not received disk and KC has not either.  Called BuxtonShauna and advised her of the above. Reports it went out Thursday 02/10/14 should receive it in 5-7 business days. Will keep look out

## 2014-02-15 NOTE — Telephone Encounter (Signed)
What's the status of this? Thanks! 

## 2014-02-16 NOTE — Telephone Encounter (Signed)
Pt was calling back to check on status of disc, informed her that the disc has been received and that nurse would call her after dr has a chance to review it.Caren GriffinsStanley A Dalton

## 2014-02-21 NOTE — Telephone Encounter (Signed)
Let pt know that I have reviewed her ct from [facility].  She does have an abnormality on the left that is different from her scan in august 2015.  I suspect this is "rounded atelectasis", where a piece of lung does not get air and rolls up into a little ball.  But, this has to be followed up.  I would like for her to have a f/u ct in April on OUR scanner.  Please put in for noncontrast scan in April with us if pt is ok with this.

## 2014-02-22 NOTE — Telephone Encounter (Signed)
Spoke with pt and advised of Dr Clance's recommTeddy Spikeendations regarding repeat chest ct .  PT is ok with repeating chest ct (order placed).  Pt wants to know if there is anything else she should be doing regarding her symptoms of hoarseness, cough (occas prod), pain in both sides of chest with cough and bending over, feels like lungs are closed and cant get air sometimes.  Pt takes Oxy IR for pain and this does help some.  Pt has cut down to 4 cigs/day on Wellbutrin.Marland Kitchen.  Please advise if anything else pt can do until repeat chest ct is done.

## 2014-02-22 NOTE — Telephone Encounter (Signed)
Spoke with pt. She is aware of KC's recommendations. Nothing further was needed.

## 2014-02-22 NOTE — Telephone Encounter (Signed)
Whenever I hear hoarseness and cough in same sentence, this usually means upper airway cough.  If she is having postnasal drip, make sure she is taking an antihistamine each day. Her cough will probably never go away until she totally quits smoking.  The smoke causes increased mucus production from her nose and chest.  She can take advil for her chest soreness to see if helps.  Will put order for followup ct chest, and call her once done.

## 2014-02-22 NOTE — Telephone Encounter (Signed)
Pt returned call [phone]

## 2014-02-22 NOTE — Telephone Encounter (Signed)
lmtcb x1 

## 2014-02-22 NOTE — Telephone Encounter (Signed)
LMTC x 1  

## 2014-05-03 ENCOUNTER — Inpatient Hospital Stay: Admission: RE | Admit: 2014-05-03 | Payer: Managed Care, Other (non HMO) | Source: Ambulatory Visit

## 2014-05-05 ENCOUNTER — Ambulatory Visit (INDEPENDENT_AMBULATORY_CARE_PROVIDER_SITE_OTHER)
Admission: RE | Admit: 2014-05-05 | Discharge: 2014-05-05 | Disposition: A | Discharge status: Home / Self Care | Payer: Managed Care, Other (non HMO) | Source: Ambulatory Visit | Attending: Pulmonary Disease | Admitting: Pulmonary Disease

## 2014-05-05 DIAGNOSIS — R938 Abnormal findings on diagnostic imaging of other specified body structures: Secondary | ICD-10-CM | POA: Diagnosis not present

## 2014-05-05 DIAGNOSIS — R9389 Abnormal findings on diagnostic imaging of other specified body structures: Secondary | ICD-10-CM

## 2014-05-09 ENCOUNTER — Telehealth: Payer: Self-pay | Admitting: Pulmonary Disease

## 2014-05-09 NOTE — Telephone Encounter (Signed)
I spoke with patient about results and she verbalized understanding and had no questions 

## 2014-05-09 NOTE — Telephone Encounter (Signed)
lmtcb X1 for pt.  KC please advise on ct results.  Thanks!

## 2014-05-09 NOTE — Telephone Encounter (Signed)
Pt returning call to nurse [phone]

## 2014-05-09 NOTE — Telephone Encounter (Signed)
Let pt know the [facility] has resolved, and the most recent scan is improved over the scan from 08/2013.  I see nothing worrisome on current scan.  Good news.

## 2014-09-12 ENCOUNTER — Other Ambulatory Visit: Payer: Self-pay | Admitting: Obstetrics and Gynecology

## 2014-09-12 DIAGNOSIS — N6452 Nipple discharge: Secondary | ICD-10-CM

## 2014-09-14 ENCOUNTER — Other Ambulatory Visit: Payer: Managed Care, Other (non HMO)

## 2014-09-21 ENCOUNTER — Other Ambulatory Visit: Payer: Managed Care, Other (non HMO)

## 2014-10-09 ENCOUNTER — Emergency Department (HOSPITAL_COMMUNITY): Payer: Managed Care, Other (non HMO)

## 2014-10-09 ENCOUNTER — Encounter (HOSPITAL_COMMUNITY): Payer: Self-pay | Admitting: Nurse Practitioner

## 2014-10-09 ENCOUNTER — Emergency Department (HOSPITAL_COMMUNITY)
Admission: EM | Admit: 2014-10-09 | Discharge: 2014-10-09 | Disposition: A | Discharge status: Home / Self Care | Payer: Managed Care, Other (non HMO) | Attending: Emergency Medicine | Admitting: Emergency Medicine

## 2014-10-09 DIAGNOSIS — E079 Disorder of thyroid, unspecified: Secondary | ICD-10-CM | POA: Diagnosis not present

## 2014-10-09 DIAGNOSIS — W1839XA Other fall on same level, initial encounter: Secondary | ICD-10-CM | POA: Insufficient documentation

## 2014-10-09 DIAGNOSIS — I10 Essential (primary) hypertension: Secondary | ICD-10-CM | POA: Diagnosis not present

## 2014-10-09 DIAGNOSIS — G8929 Other chronic pain: Secondary | ICD-10-CM | POA: Diagnosis not present

## 2014-10-09 DIAGNOSIS — Y9389 Activity, other specified: Secondary | ICD-10-CM | POA: Insufficient documentation

## 2014-10-09 DIAGNOSIS — Z79899 Other long term (current) drug therapy: Secondary | ICD-10-CM | POA: Insufficient documentation

## 2014-10-09 DIAGNOSIS — Z23 Encounter for immunization: Secondary | ICD-10-CM | POA: Diagnosis not present

## 2014-10-09 DIAGNOSIS — F419 Anxiety disorder, unspecified: Secondary | ICD-10-CM | POA: Diagnosis not present

## 2014-10-09 DIAGNOSIS — Z720 Tobacco use: Secondary | ICD-10-CM | POA: Diagnosis not present

## 2014-10-09 DIAGNOSIS — J45909 Unspecified asthma, uncomplicated: Secondary | ICD-10-CM | POA: Diagnosis not present

## 2014-10-09 DIAGNOSIS — Z8719 Personal history of other diseases of the digestive system: Secondary | ICD-10-CM | POA: Diagnosis not present

## 2014-10-09 DIAGNOSIS — S80811A Abrasion, right lower leg, initial encounter: Secondary | ICD-10-CM | POA: Insufficient documentation

## 2014-10-09 DIAGNOSIS — Y9289 Other specified places as the place of occurrence of the external cause: Secondary | ICD-10-CM | POA: Insufficient documentation

## 2014-10-09 DIAGNOSIS — H9190 Unspecified hearing loss, unspecified ear: Secondary | ICD-10-CM | POA: Insufficient documentation

## 2014-10-09 DIAGNOSIS — Y998 Other external cause status: Secondary | ICD-10-CM | POA: Diagnosis not present

## 2014-10-09 DIAGNOSIS — S299XXA Unspecified injury of thorax, initial encounter: Secondary | ICD-10-CM | POA: Insufficient documentation

## 2014-10-09 DIAGNOSIS — R0789 Other chest pain: Secondary | ICD-10-CM

## 2014-10-09 HISTORY — DX: Extrapyramidal and movement disorder, unspecified: G25.9

## 2014-10-09 HISTORY — DX: Dyspnea, unspecified: R06.00

## 2014-10-09 HISTORY — DX: Other chronic pain: G89.29

## 2014-10-09 HISTORY — DX: Other chest pain: R07.89

## 2014-10-09 HISTORY — DX: Pain, unspecified: R52

## 2014-10-09 LAB — I-STAT TROPONIN, ED: Troponin i, poc: 0 ng/mL (ref 0.00–0.08)

## 2014-10-09 LAB — CBC
HCT: 44.5 % (ref 36.0–46.0)
Hemoglobin: 14.8 g/dL (ref 12.0–15.0)
MCH: 32.5 pg (ref 26.0–34.0)
MCHC: 33.3 g/dL (ref 30.0–36.0)
MCV: 97.8 fL (ref 78.0–100.0)
PLATELETS: 255 10*3/uL (ref 150–400)
RBC: 4.55 MIL/uL (ref 3.87–5.11)
RDW: 12.7 % (ref 11.5–15.5)
WBC: 5.5 10*3/uL (ref 4.0–10.5)

## 2014-10-09 LAB — BASIC METABOLIC PANEL
ANION GAP: 10 (ref 5–15)
BUN: 11 mg/dL (ref 6–20)
CALCIUM: 9.1 mg/dL (ref 8.9–10.3)
CO2: 25 mmol/L (ref 22–32)
CREATININE: 1.07 mg/dL — AB (ref 0.44–1.00)
Chloride: 104 mmol/L (ref 101–111)
GFR calc Af Amer: >60 mL/min (ref >60)
GFR calc non Af Amer: >60 mL/min (ref >60)
Glucose, Bld: 102 mg/dL — ABNORMAL HIGH (ref 65–99)
Potassium: 4.3 mmol/L (ref 3.5–5.1)
SODIUM: 139 mmol/L (ref 135–145)

## 2014-10-09 LAB — D-DIMER, QUANTITATIVE (NOT AT ARMC): D DIMER QUANT: 0.33 ug{FEU}/mL (ref 0.00–0.48)

## 2014-10-09 MED ORDER — PROMETHAZINE HCL 25 MG PO TABS
25.0000 mg | ORAL_TABLET | Freq: Once | ORAL | Status: AC
  Administered 2014-10-09: 25 mg via ORAL
  Filled 2014-10-09: qty 1

## 2014-10-09 MED ORDER — METHOCARBAMOL 500 MG PO TABS: 1000.0000 mg | ORAL_TABLET | Freq: Four times a day (QID) | ORAL | Status: DC | PRN

## 2014-10-09 MED ORDER — OXYCODONE HCL 5 MG PO TABS
5.0000 mg | ORAL_TABLET | Freq: Once | ORAL | Status: AC
  Administered 2014-10-09: 5 mg via ORAL
  Filled 2014-10-09: qty 1

## 2014-10-09 MED ORDER — TETANUS-DIPHTH-ACELL PERTUSSIS 5-2.5-18.5 LF-MCG/0.5 IM SUSP
0.5000 mL | Freq: Once | INTRAMUSCULAR | Status: AC
  Administered 2014-10-09: 0.5 mL via INTRAMUSCULAR
  Filled 2014-10-09: qty 0.5

## 2014-10-09 NOTE — ED Notes (Signed)
Patient returned from X-ray 

## 2014-10-09 NOTE — ED Notes (Addendum)
Pt reports "my chest feels like its caving in all the way to my back." states shes been having this pain for a year and was working with pulmonologist for abnormal lung volumes and CXR but he left the practice and the pain got worse today. She c/o some SOB when lying flat today too. shes able to breathe easily and speak in full unlabored sentces at this time. She tried her rx for oxycodone  with no relief. She also would like RLE to be checked after a fall on Monday, bruising and pain to RLE, cms intact

## 2014-10-09 NOTE — ED Provider Notes (Signed)
CSN: [id number]     Arrival date & time 10/09/14  1013 History   First MD Initiated Contact with Patient 10/09/14 1204     Chief Complaint  Patient presents with  . Chest Pain  . Shortness of Breath  . Leg Pain    HPI Pt was seen at 1210. Per pt, c/o gradual onset and persistence of constant acute flair of her chronic chest "pain" for the past 1 year. Describes the CP as "tight" and has been associated with chronic SOB. Pt has been extensively evaluated by her PMD and Pulm MD for same, with unclear dx. Pt states she is a Pain Management pt. Pt also c/o RLE abrasion and pain s/p fall 1 week ago. Pt states she "justs wants it checked out." Denies focal motor weakness, no tingling/numbness in extremities, no neck or back pain, no abd pain. Denies palpitations, no cough, no fevers, no rash.    Past Medical History  Diagnosis Date  . Allergy   . Anxiety   . Asthma   . GERD (gastroesophageal reflux disease)   . Hypertension   . Thyroid disease   . TIA (transient ischemic attack)   . Hearing loss   . Bruises easily   . Leg swelling     both  . Cough   . Fibromyalgia   . High cholesterol   . Kidney disease     stage 3  . Transient cerebral ischemia   . Tic disorder   . SMAS (superior mesenteric artery syndrome)   . Functional movement disorder   . Pain management   . Chronic pain   . Chronic chest wall pain   . Dyspnea     chronic   Past Surgical History  Procedure Laterality Date  . Cholecystectomy    . Appendectomy    . Abdominal hysterectomy    . Tee without cardioversion N/A 06/18/2012    Procedure: TRANSESOPHAGEAL ECHOCARDIOGRAM (TEE);  Surgeon: Thurmon Fair, MD;  Location: [facility] ENDOSCOPY;  Service: Cardiovascular;  Laterality: N/A;  . 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 Hernia repair    . Cyst removed    . Oophorectomy      bilateral  . Breast lumpectomy     Family History  Problem Relation Age of Onset  . Hypertension Sister    . Cancer Paternal Grandmother     breast  . Pulmonary fibrosis Maternal Grandmother   . Leukemia Paternal Grandfather   . Parkinson's disease Maternal Aunt   . Emphysema Maternal Grandmother   . Pulmonary fibrosis Maternal Grandmother   . Asthma Father   . Asthma Maternal Grandfather   . Heart disease Maternal Grandfather   . Heart disease Maternal Grandmother   . Heart disease Paternal Grandfather   . Heart disease Paternal Grandmother   . Clotting disorder Paternal Grandfather    Social History  Substance Use Topics  . Smoking status: Current Every Day Smoker -- 0.40 packs/day for 15 years    Types: Cigarettes  . Smokeless tobacco: None  . Alcohol Use: No    Review of Systems ROS: Statement: All systems negative except as marked or noted in the HPI; Constitutional: Negative for fever and chills. ; ; Eyes: Negative for eye pain, redness and discharge. ; ; ENMT: Negative for ear pain, hoarseness, nasal congestion, sinus pressure and sore throat. ; ; Cardiovascular: Negative for palpitations, diaphoresis, dyspnea and peripheral edema. ; ; Respiratory: Negative for cough, wheezing and  stridor. ; ; Gastrointestinal: Negative for nausea, vomiting, diarrhea, abdominal pain, blood in stool, hematemesis, jaundice and rectal bleeding. . ; ; Genitourinary: Negative for dysuria, flank pain and hematuria. ; ; Musculoskeletal: +chronic chest wall pain. Negative for back pain and neck pain. Negative for swelling and trauma.; ; Skin: +abrasion. Negative for pruritus, rash, blisters, bruising and skin lesion.; ; Neuro: Negative for headache, lightheadedness and neck stiffness. Negative for weakness, altered level of consciousness , altered mental status, extremity weakness, paresthesias, involuntary movement, seizure and syncope.     Allergies  Codeine  Home Medications   Prior to Admission medications   Medication Sig Start Date End Date Taking? Authorizing Provider  albuterol (PROVENTIL  HFA;VENTOLIN HFA) 108 (90 BASE) MCG/ACT inhaler Inhale 2 puffs into the lungs every 6 (six) hours as needed. For shortness of breath    Historical Provider, MD  buPROPion (ZYBAN) 150 MG 12 hr tablet 1 tablet once daily x 3 days, then increase to 1 tablet twice daily. Stop smoking after 1st week 02/04/14   Barbaraann Share, MD  diazepam (VALIUM) 5 MG tablet Take 7.5 mg by mouth 2 (two) times daily.     Historical Provider, MD  furosemide (LASIX) 20 MG tablet Take 1 tablet (20 mg total) by mouth daily. Patient taking differently: Take 20 mg by mouth as needed.  01/17/13   Rolland Porter, MD  hydrochlorothiazide (HYDRODIURIL) 12.5 MG tablet Take 1 tablet (12.5 mg total) by mouth daily. 01/17/13   Rolland Porter, MD  ibuprofen (ADVIL,MOTRIN) 200 MG tablet Take 800 mg by mouth every 6 (six) hours as needed for mild pain.    Historical Provider, MD  levothyroxine (SYNTHROID, LEVOTHROID) 88 MCG tablet Take 88 mcg by mouth daily.    Historical Provider, MD  oxycodone (OXY-IR) 5 MG capsule Take 5 mg by mouth every 6 (six) hours as needed.    Historical Provider, MD  promethazine (PHENERGAN) 25 MG tablet Take 25 mg by mouth every 6 (six) hours as needed for nausea or vomiting.    Historical Provider, MD  propranolol (INDERAL LA) 80 MG 24 hr capsule Take 80 mg by mouth daily.     Historical Provider, MD   BP 97/56 mmHg  Pulse 60  Temp(Src) 98.3 F (36.8 C) (Oral)  Resp 10  SpO2 98% Physical Exam  1215: Physical examination:  Nursing notes reviewed; Vital signs and O2 SAT reviewed;  Constitutional: Well developed, Well nourished, Well hydrated, In no acute distress; Head:  Normocephalic, atraumatic; Eyes: EOMI, PERRL, No scleral icterus; ENMT: Mouth and pharynx normal, Mucous membranes moist; Neck: Supple, Full range of motion, No lymphadenopathy; Cardiovascular: Regular rate and rhythm, No murmur, rub, or gallop; Respiratory: Breath sounds clear & equal bilaterally, No rales, rhonchi, wheezes.  Speaking full sentences  with ease, Normal respiratory effort/excursion; Chest: +TTP bilat parasternal areas. No rash, no soft tissue crepitus, no deformity. Movement normal; Abdomen: Soft, Nontender, Nondistended, Normal bowel sounds; Genitourinary: No CVA tenderness; Extremities: Pulses normal, +superficial abrasion with localized ecchymosis and tenderness to right mid-tibial area. No deformity, no soft tissue crepitus, no purulent drainage. No edema, No calf edema or asymmetry.; Neuro: AA&Ox3, Major CN grossly intact.  Speech clear. No gross focal motor or sensory deficits in extremities.; Skin: Color normal, Warm, Dry.   ED Course  Procedures (including critical care time) Labs Review   Imaging Review  I have personally reviewed and evaluated these images and lab results as part of my medical decision-making.   EKG Interpretation  Date/Time:  Sunday October 09 2014 11:04:52 EDT Ventricular Rate:  74 PR Interval:  174 QRS Duration: 72 QT Interval:  350 QTC Calculation: 388 R Axis:   64 Text Interpretation:  Normal sinus rhythm Normal ECG When compared with  ECG of 09/17/2013 No significant change was found Confirmed by Everlene Cunning  MD,  Bryker Fletchall ([id number]) on 10/09/2014 12:27:23 PM      MDM  MDM Reviewed: previous chart, nursing note and vitals Reviewed previous: labs and ECG Interpretation: labs, ECG and x-ray     Results for orders placed or performed during the hospital encounter of 10/09/14  Basic metabolic panel  Result Value Ref Range   Sodium 139 135 - 145 mmol/L   Potassium 4.3 3.5 - 5.1 mmol/L   Chloride 104 101 - 111 mmol/L   CO2 25 22 - 32 mmol/L   Glucose, Bld 102 (H) 65 - 99 mg/dL   BUN 11 6 - 20 mg/dL   Creatinine, Ser 1.07 (H) 0.44 - 1.00 mg/dL   Calcium 9.1 8.9 - 10.3 mg/dL   GFR calc non Af Amer >60 >60 mL/min   GFR calc Af Amer >60 >60 mL/min   Anion gap 10 5 - 15  CBC  Result Value Ref Range   WBC 5.5 4.0 - 10.5 K/uL   RBC 4.55 3.87 - 5.11 MIL/uL   Hemoglobin 14.8 12.0 -  15.0 g/dL   HCT 44.5 36.0 - 46.0 %   MCV 97.8 78.0 - 100.0 fL   MCH 32.5 26.0 - 34.0 pg   MCHC 33.3 30.0 - 36.0 g/dL   RDW 12.7 11.5 - 15.5 %   Platelets 255 150 - 400 K/uL  D-dimer, quantitative  Result Value Ref Range   D-Dimer, Quant 0.33 0.00 - 0.48 ug/mL-FEU  I-stat troponin, ED  Result Value Ref Range   Troponin i, poc 0.00 0.00 - 0.08 ng/mL   Comment 3           Dg Chest 2 View 10/09/2014   CLINICAL DATA:  Shortness of breath, upper back pain, and right-sided chest pain for 1 week.  EXAM: CHEST  2 VIEW  COMPARISON:  Chest CT 05/05/2014.  Rib radiographs 09/27/2013.  FINDINGS: The cardiomediastinal silhouette is within normal limits. The lungs are clear. No pleural effusion or pneumothorax is seen. No acute osseous abnormality is identified.  IMPRESSION: No active cardiopulmonary disease.   Electronically Signed   By: Allen  Grady M.D.   On: 10/09/2014 13:35   Dg Tibia/fibula Right 10/09/2014   CLINICAL DATA:  Ecchymosis on the right lower extremity, no provided history of trauma  EXAM: RIGHT TIBIA AND FIBULA - 2 VIEW  COMPARISON:  None.  FINDINGS: There is no evidence of fracture or other focal bone lesions. Soft tissues are unremarkable. A phlebolith is incidentally noted over the medial soft tissues.  IMPRESSION: Negative.   Electronically Signed   By: Gretchen  Green M.D.   On: 10/09/2014 13:34     15 05:  Feels better after meds and wants to go home now. Already has oxycodone at home. Long hx of chronic pain.  Pt endorses acute flair of her usual long standing chronic pain today, no change from her usual chronic pain pattern.  Pt encouraged to f/u with her PMD and Pain Management doctor for good continuity of care and control of her chronic pain.  Verb understanding.    Samuel Jester, DO 10/12/14 [phone]

## 2014-10-09 NOTE — Discharge Instructions (Signed)
°Emergency Department Resource Guide °1) Find a Doctor and Pay Out of Pocket °Although you won't have to find out who is covered by your insurance plan, it is a good idea to ask around and get recommendations. You will then need to call the office and see if the doctor you have chosen will accept you as a new patient and what types of options they offer for patients who are self-pay. Some doctors offer discounts or will set up payment plans for their patients who do not have insurance, but you will need to ask so you aren't surprised when you get to your appointment. ° °2) Contact Your Local Health Department °Not all health departments have doctors that can see patients for sick visits, but many do, so it is worth a call to see if yours does. If you don't know where your local health department is, you can check in your phone book. The CDC also has a tool to help you locate your state's health department, and many state websites also have listings of all of their local health departments. ° °3) Find a Walk-in Clinic °If your illness is not likely to be very severe or complicated, you may want to try a walk in clinic. These are popping up all over the country in pharmacies, drugstores, and shopping centers. They're usually staffed by nurse practitioners or physician assistants that have been trained to treat common illnesses and complaints. They're usually fairly quick and inexpensive. However, if you have serious medical issues or chronic medical problems, these are probably not your best option. ° °No Primary Care Doctor: °- Call Health Connect at  832-8000 - they can help you locate a primary care doctor that  accepts your insurance, provides certain services, etc. °- Physician Referral Service- 1-800-533-3463 ° °Chronic Pain Problems: °Organization         Address  Phone   Notes  °[facility]  (336) 297-2271 Patients need to be referred by their primary care doctor.  ° °Medication  Assistance: °Organization         Address  Phone   Notes  °[place] [facility] [address] °[place], [place] [address] (336) 641-8030 --Must be a resident of [place] °-- Must have NO insurance coverage whatsoever (no Medicaid/ Medicare, etc.) °-- The pt. MUST have a primary care doctor that directs their care regularly and follows them in the community °  °[facility]  (866) 331-1348   °[employer]  (888) 892-1162   ° °Agencies that provide inexpensive medical care: °Organization         Address  Phone   Notes  °[facility]  (336) 832-8035   °[facility]    (336) 832-7272   °[facility] [address] °[place], [place] [address] (336) 832-4777   °[facility] [address], °[place] (336) 271-4999   °[facility]    (336) 373-0678   °[facility]    (336) 272-1050   °[facility] ° [address], [place] Phone:  (336) 832-4444, Fax:  (336) 832-4440 Hours of Operation:  9 am - 6 pm, M-F.  Also accepts Medicaid/Medicare and self-pay.  °[facility] ° [address], [place] Phone: (336) 832-3150, Fax: (336) 832-3151. Hours of Operation:  8:30 am - 5:30 pm, M-F.  Also accepts Medicaid and self-pay.  °[facility] [place] [address], [place] Phone: (336) 878-6027   °[facility] [address], [place], [place] (336)723-1848, Ext. 123 Mondays & Thursdays: 7-9 AM.  First 15 patients are seen on a first come, first serve basis. °  ° °Medicaid-accepting [place] Providers: ° °Organization         Address  Phone   Notes  °[facility] [address], [place] (336) 641-2100 Also accepts self-pay patients.  °[facility] [address], [place] ° (336) 856-9996   °[facility] [address], [place]  (336) 288-8857   °[facility] [address] [place] [address], [place] (336) 299-7000   °Veita Bland [address], [place]  ° (336) 373-1557 Only accepts [place] Access Medicaid patients after they have their name applied to their card.  ° °Self-Pay (no insurance) in [place]: ° °Organization         Address  Phone   Notes  °Sickle Cell Patients, [facility] [address], [place] (336) 832-1970   °[facility] [address], [place] (336) 832-4400   °[facility] ° [address], [place] (336) 992-4800   °[facility]/Dr. Osei-Bonsu ° [address] [place] [address], [place] or [address], [place] (336) 841-8500 Phone number for both [place] and [place] locations is the same.  °[facility] [address], [place] (336) 299-0000   °[facility] [address] [place] [address], [place] or [address] (336) 852-7530 °(336) 878-2260   °[facility] [address], [place] (336) 350-1642, phone; (336) 294-5005, fax Sees patients 1st and 3rd Saturday of every month.  Must not qualify for public or private insurance (i.e. Medicaid, Medicare, [place] Health Choice, Veterans' Benefits) • Household income should be no more than 200% of the poverty level •The clinic cannot treat you if you are pregnant or think you are pregnant • Sexually transmitted diseases are not treated at the clinic.  ° ° °Dental Care: °Organization         Address  Phone  Notes  °[place] [facility] [address], [place] (336) 641-6152 Accepts children up to age 21 who are enrolled in Medicaid or [place] Health Choice; pregnant women with a Medicaid card; and children who have applied for Medicaid or [place] Health Choice, but were declined, whose parents can pay a reduced fee at time of service.  °[place]  [facility] [place]  [address], [place] (336) 641-7733 Accepts children up to age 21 who are enrolled in Medicaid or [place] Health Choice; pregnant women with a Medicaid card; and children who have applied for Medicaid or [place] Health Choice, but were declined, whose parents can pay a reduced fee at time of service.  °[facility] ° [address], [place] (336) 641-4533 Patients are seen by appointment only. Walk-ins are not accepted. [facility] will see patients 18 years of age and older. °Monday - Tuesday (8am-5pm) °Most Wednesdays (8:30-5pm) °$30 per visit, cash only  °[facility] ° [address], [place] (336) 641-4533 Patients are seen by appointment only. Walk-ins are not accepted. [facility] will see patients 18 years of age and older. °One   Wednesday Evening (Monthly: Volunteer Based).  $30 per visit, cash only  °[facility]  (919) 537-3737 for adults; Children under age 4, call Graduate Pediatric Dentistry at (919) [phone]. Children aged 4-14, please call (919) 537-3737 to request a pediatric application. ° Dental services are provided in all areas of dental care including fillings, crowns and bridges, complete and partial dentures, implants, gum treatment, root canals, and extractions. Preventive care is also provided. Treatment is provided to both adults and children. °Patients are selected via a lottery and there is often a waiting list. °  °[facility] [address], °[place] ° (336) 763-8833 [email or web address] °  °[facility] [address], [place], [place] (336)723-1848, Ext. 123 Second and Fourth Thursday of each month, opens at 6:30 AM; Clinic ends at 9 AM.  Patients are seen on a first-come first-served basis, and a limited number are seen during each clinic.  ° °[facility] ° [address], [place], [place] (336) 723-7904    Eligibility Requirements °You must have lived in [place], or [place] counties for at least the last three months. °  You cannot be eligible for state or federal sponsored healthcare insurance, including Veterans Administration, Medicaid, or Medicare. °  You generally cannot be eligible for healthcare insurance through your employer.  °  How to apply: °Eligibility screenings are held every Tuesday and Wednesday afternoon from 1:00 pm until 4:00 pm. You do not need an appointment for the interview!  °[facility] [address], [place], [place] 336-631-2330   °[facility]  336-342-8273   °[facility]  336-703-3100   °[facility]  336-570-6415   ° °Behavioral Health Resources in the Community: °Intensive Outpatient Programs °Organization         Address  Phone  Notes  °[facility] [address], [place], [place] 336-878-6098   °[facility] [address], [place], [place] 336-832-9800   °[facility] [address], [place], [place] ° 336-882-2125   °[facility] [address],  °[place], [place] 1-800-853-5163 or 336-641-4981   °Substance Abuse Resources °Organization         Address  Phone  Notes  °[facility]  336-882-2125   °[facility]  336-784-9470   °[facility]  336-285-9073   °[facility]  336-845-3988   °Residential & Outpatient Substance Abuse Program  1-800-659-3381   °Psychological Services °Organization         Address  Phone  Notes  °[facility]  336- 832-9600   °[facility]  336- 378-7881   °[facility] [address], [place] 1-800-853-5163 or 336-641-4981   ° °Mobile Crisis Teams °Organization         Address  Phone  Notes  °[facility], Mobile Crisis Care Unit  1-877-626-1772   °Assertive °[facility] ° [address]  [place], [place] 336-834-9664   °Sharon DeEsch [address] °[place] 336-554-5454   ° °Self-Help/Support Groups °Organization         Address  Phone             Notes  °[facility]  - variety of support groups  336- 373-1402 Call for more information  °Narcotics Anonymous (NA), [facility] [address], °[place]  2 meetings at this location  ° °  Residential Treatment Programs Organization         Address  Phone  Notes  [facility] [address]    [place]  6-962-952-8413   [facility]  [address], [place] [address], [place], [place] 272-536-6440   [facility] [address] [place], [place] 347-425-9563 Admissions: 8am-3pm M-F  [facility] [address]    [place], [place] 875-643-3295   [facility] [address] [place], [place], [place] 188-416-6063   [facility] [address]  [place], [place] 016-010-9323   [facility] - Intensive Outpatient [address], [place], [place] 557-322-0254   [facility] ([facility] [address] [place]  [place], [place] 2-706-237-6283 or (574)663-1569   [facility] ([facility]) [address] [place], [place] 710-626-9485 Accepts Medicaid  [facility] [address]  [place] 4-627-035-0093 Substance Abuse/Addiction Treatment   [facility]         Address  Phone  Notes  [facility]  (905)833-5106   Angie Fava, PhD [address] [place], [place]   617-861-3284 or (731) 733-2447   [facility]   [address] [place], [place] 360-182-5112   [facility] [address] [place], [place] (684)228-6982 Insurance/Medicaid/sponsorship through [facility] [address]                                    [place], [place] 360-018-7341 Therapy/tele-psych/case    [facility] [address], [place] (820)324-9938    Dr. Lolly Mustache  (716)337-8353   [facility]  [employer] [facility] 1) [address], [place] 2) [address] [place] 3)  [address], [place] [phone]  (551)106-5492   [facility] Child Abuse Hotline (204)660-4531 or 519-078-7086 (After Hours)      Take the prescription as directed.  Wash the abraded area with soap and water at least twice a day, and cover with a clean/dry dressing.  Change the dressing whenever it becomes wet or soiled after washing the area with soap and water. Apply moist heat or ice to the area(s) of discomfort, for 15 minutes at a time, several times per day for the next few days.  Do not fall asleep on a heating or ice pack.  Call your regular medical doctor on Monday to schedule a follow up appointment this week.  Return to the Emergency Department immediately if worsening.

## 2014-10-09 NOTE — ED Notes (Signed)
MD McManus at the bedside.  

## 2015-01-21 DIAGNOSIS — K551 Chronic vascular disorders of intestine: Secondary | ICD-10-CM | POA: Insufficient documentation

## 2016-04-23 ENCOUNTER — Other Ambulatory Visit: Payer: Self-pay | Admitting: Cardiology

## 2016-04-23 DIAGNOSIS — M545 Low back pain: Secondary | ICD-10-CM

## 2016-05-10 ENCOUNTER — Ambulatory Visit
Admission: RE | Admit: 2016-05-10 | Discharge: 2016-05-10 | Disposition: A | Discharge status: Home / Self Care | Payer: Managed Care, Other (non HMO) | Source: Ambulatory Visit | Attending: Cardiology | Admitting: Cardiology

## 2016-05-10 DIAGNOSIS — M545 Low back pain: Secondary | ICD-10-CM

## 2016-07-24 ENCOUNTER — Other Ambulatory Visit (HOSPITAL_COMMUNITY): Payer: Self-pay | Admitting: Cardiology

## 2016-07-24 DIAGNOSIS — R42 Dizziness and giddiness: Secondary | ICD-10-CM

## 2016-08-05 ENCOUNTER — Ambulatory Visit (HOSPITAL_COMMUNITY): Admission: RE | Admit: 2016-08-05 | Payer: Managed Care, Other (non HMO) | Source: Ambulatory Visit

## 2016-08-14 ENCOUNTER — Ambulatory Visit (HOSPITAL_COMMUNITY)
Admission: RE | Admit: 2016-08-14 | Discharge: 2016-08-14 | Disposition: A | Discharge status: Home / Self Care | Payer: Managed Care, Other (non HMO) | Source: Ambulatory Visit | Attending: Cardiology | Admitting: Cardiology

## 2016-08-14 DIAGNOSIS — R42 Dizziness and giddiness: Secondary | ICD-10-CM | POA: Diagnosis not present

## 2016-11-29 ENCOUNTER — Other Ambulatory Visit: Payer: Self-pay | Admitting: Neurosurgery

## 2016-12-17 NOTE — Pre-Procedure Instructions (Signed)
Sabrina Roberson  12/17/2016      [id number] - [place], [place] - [address] [place] [id number] Phone: [phone] Fax: [phone]  [id number] [place], [place] - [address] [place] [id number] Phone: [phone] Fax: [phone]  [facility] [place] , [place] - [address] [place] [address] Phone: [phone] Fax: [phone]    Your procedure is scheduled on November 26  [facility].M.  Call this number if you have problems the morning of surgery:  [phone]   Remember:  Do not eat food or drink liquids after midnight.  Continue all medications as directed by your physician except follow these medication instructions before surgery below   Take these medicines the morning of surgery with A SIP OF WATER albuterol (PROVENTIL HFA;VENTOLIN HFA) diazepam (VALIUM) levothyroxine (SYNTHROID, LEVOTHROID) methocarbamol (ROBAXIN)  oxycodone (OXY-IR)  propranolol (INDERAL)  7 days prior to surgery STOP taking any Aspirin(unless otherwise instructed by your surgeon), Aleve, Naproxen, Ibuprofen, Motrin, Advil, Goody's, BC's, all herbal medications, fish oil, and all vitamins    Do not wear jewelry, make-up or nail polish.  Do not wear lotions, powders, or perfumes, or deoderant.  Do not shave 48 hours prior to surgery.  Men may shave face and neck.  Do not bring valuables to the hospital.  [facility] is not responsible for any belongings or valuables.  Contacts, dentures or bridgework may not be worn into surgery.  Leave your suitcase in the car.  After surgery it may be brought to your room.  For patients admitted to the hospital, discharge time will be determined by your treatment team.  Patients discharged the day of surgery will not be allowed to drive home.   Special instructions:   [place]-  Preparing For Surgery  Before surgery, you can play an important role. Because skin is not sterile, your skin needs to be as free of germs as possible. You can reduce the number of germs on your skin by washing with CHG (chlorahexidine gluconate) Soap before surgery.  CHG is an antiseptic cleaner which kills germs and bonds with the skin to continue killing germs even after washing.  Please do not use if you have an allergy to CHG or antibacterial soaps. If your skin becomes reddened/irritated stop using the CHG.  Do not shave (including legs and underarms) for at least 48 hours prior to first CHG shower. It is OK to shave your face.  Please follow these instructions carefully.   1. Shower the NIGHT BEFORE SURGERY and the MORNING OF SURGERY with CHG.   2. If you chose to wash your hair, wash your hair first as usual with your normal shampoo.  3. After you shampoo, rinse your hair and body thoroughly to remove the shampoo.  4. Use CHG as you would any other liquid soap. You can apply CHG directly to the skin and wash gently with a scrungie or a clean washcloth.   5. Apply the CHG Soap to your body ONLY FROM THE NECK DOWN.  Do not use on open wounds or open sores. Avoid contact with your eyes, ears, mouth and genitals (private parts). Wash Face and genitals (private parts)  with your normal soap.  6. Wash thoroughly, paying special attention to the area where your surgery will be performed.  7. Thoroughly rinse your body  with warm water from the neck down.  8. DO NOT shower/wash with your normal soap after using and rinsing off the CHG Soap.  9. Pat yourself dry with a CLEAN TOWEL.  10. Wear CLEAN PAJAMAS to bed the night before surgery, wear comfortable clothes the morning of surgery  11. Place CLEAN SHEETS on your bed the night of your first shower and DO NOT SLEEP WITH PETS.    Day of Surgery: Do not apply any deodorants/lotions. Please wear clean clothes to the hospital/surgery  center.      Please read over the following fact sheets that you were given.

## 2016-12-18 ENCOUNTER — Other Ambulatory Visit: Payer: Self-pay

## 2016-12-18 ENCOUNTER — Encounter (HOSPITAL_COMMUNITY)
Admission: RE | Admit: 2016-12-18 | Discharge: 2016-12-18 | Disposition: A | Discharge status: Home / Self Care | Payer: Managed Care, Other (non HMO) | Source: Ambulatory Visit | Attending: Neurosurgery | Admitting: Neurosurgery

## 2016-12-18 ENCOUNTER — Encounter (HOSPITAL_COMMUNITY): Payer: Self-pay

## 2016-12-18 DIAGNOSIS — E1122 Type 2 diabetes mellitus with diabetic chronic kidney disease: Secondary | ICD-10-CM | POA: Insufficient documentation

## 2016-12-18 DIAGNOSIS — Z01810 Encounter for preprocedural cardiovascular examination: Secondary | ICD-10-CM | POA: Diagnosis present

## 2016-12-18 DIAGNOSIS — E785 Hyperlipidemia, unspecified: Secondary | ICD-10-CM | POA: Insufficient documentation

## 2016-12-18 DIAGNOSIS — J45909 Unspecified asthma, uncomplicated: Secondary | ICD-10-CM | POA: Insufficient documentation

## 2016-12-18 DIAGNOSIS — Z79899 Other long term (current) drug therapy: Secondary | ICD-10-CM | POA: Insufficient documentation

## 2016-12-18 DIAGNOSIS — F419 Anxiety disorder, unspecified: Secondary | ICD-10-CM | POA: Diagnosis not present

## 2016-12-18 DIAGNOSIS — K219 Gastro-esophageal reflux disease without esophagitis: Secondary | ICD-10-CM | POA: Insufficient documentation

## 2016-12-18 DIAGNOSIS — I129 Hypertensive chronic kidney disease with stage 1 through stage 4 chronic kidney disease, or unspecified chronic kidney disease: Secondary | ICD-10-CM | POA: Diagnosis not present

## 2016-12-18 DIAGNOSIS — N183 Chronic kidney disease, stage 3 (moderate): Secondary | ICD-10-CM | POA: Insufficient documentation

## 2016-12-18 DIAGNOSIS — R001 Bradycardia, unspecified: Secondary | ICD-10-CM | POA: Diagnosis not present

## 2016-12-18 DIAGNOSIS — F17210 Nicotine dependence, cigarettes, uncomplicated: Secondary | ICD-10-CM | POA: Diagnosis not present

## 2016-12-18 DIAGNOSIS — Z8673 Personal history of transient ischemic attack (TIA), and cerebral infarction without residual deficits: Secondary | ICD-10-CM | POA: Diagnosis not present

## 2016-12-18 DIAGNOSIS — Z01812 Encounter for preprocedural laboratory examination: Secondary | ICD-10-CM | POA: Diagnosis present

## 2016-12-18 DIAGNOSIS — Z7984 Long term (current) use of oral hypoglycemic drugs: Secondary | ICD-10-CM | POA: Diagnosis not present

## 2016-12-18 HISTORY — DX: Headache, unspecified: R51.9

## 2016-12-18 HISTORY — DX: Nausea with vomiting, unspecified: R11.2

## 2016-12-18 HISTORY — DX: Major depressive disorder, single episode, unspecified: F32.9

## 2016-12-18 HISTORY — DX: Dislocation of jaw, unspecified side, initial encounter: S03.00XA

## 2016-12-18 HISTORY — DX: Hypothyroidism, unspecified: E03.9

## 2016-12-18 HISTORY — DX: Type 2 diabetes mellitus without complications: E11.9

## 2016-12-18 HISTORY — DX: Headache: R51

## 2016-12-18 HISTORY — DX: Depression, unspecified: F32.A

## 2016-12-18 HISTORY — DX: Unspecified osteoarthritis, unspecified site: M19.90

## 2016-12-18 HISTORY — DX: Personal history of other diseases of the digestive system: Z87.19

## 2016-12-18 HISTORY — DX: Other specified postprocedural states: Z98.890

## 2016-12-18 LAB — BASIC METABOLIC PANEL
Anion gap: 9 (ref 5–15)
BUN: 18 mg/dL (ref 6–20)
CALCIUM: 9.3 mg/dL (ref 8.9–10.3)
CO2: 26 mmol/L (ref 22–32)
CREATININE: 1.1 mg/dL — AB (ref 0.44–1.00)
Chloride: 105 mmol/L (ref 101–111)
GFR calc non Af Amer: >60 mL/min (ref >60)
Glucose, Bld: 89 mg/dL (ref 65–99)
Potassium: 3.9 mmol/L (ref 3.5–5.1)
SODIUM: 140 mmol/L (ref 135–145)

## 2016-12-18 LAB — ABO/RH: ABO/RH(D): O POS

## 2016-12-18 LAB — CBC
HCT: 42.6 % (ref 36.0–46.0)
Hemoglobin: 13.7 g/dL (ref 12.0–15.0)
MCH: 31 pg (ref 26.0–34.0)
MCHC: 32.2 g/dL (ref 30.0–36.0)
MCV: 96.4 fL (ref 78.0–100.0)
Platelets: 327 10*3/uL (ref 150–400)
RBC: 4.42 MIL/uL (ref 3.87–5.11)
RDW: 13.8 % (ref 11.5–15.5)
WBC: 7.4 10*3/uL (ref 4.0–10.5)

## 2016-12-18 LAB — TYPE AND SCREEN
ABO/RH(D): O POS
Antibody Screen: NEGATIVE

## 2016-12-18 LAB — HEMOGLOBIN A1C
Hgb A1c MFr Bld: 6.3 % — ABNORMAL HIGH (ref 4.8–5.6)
MEAN PLASMA GLUCOSE: 134.11 mg/dL

## 2016-12-18 LAB — GLUCOSE, CAPILLARY: GLUCOSE-CAPILLARY: 91 mg/dL (ref 65–99)

## 2016-12-18 LAB — SURGICAL PCR SCREEN
MRSA, PCR: NEGATIVE
STAPHYLOCOCCUS AUREUS: NEGATIVE
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
## 2016-12-18 NOTE — Pre-Procedure Instructions (Signed)
Katherene PontoSallie A Waltz  12/18/2016      [id number] - [place], [place] - [address] [place] [address] Phone: [phone] Fax: [phone]  [id number] [place],  - [address] [place] [address] Phone: [phone] Fax: [phone]  [facility] [place], [place] - [address] [place] [address] Phone: [phone] Fax: [phone]    Your procedure is scheduled on November 26  [facility] at 0840 A.M.  Call this number if you have problems the morning of surgery:  [phone]   Remember:  Do not eat food or drink liquids after midnight.  Continue all medications as directed by your physician except follow these medication instructions before surgery below   Take these medicines the morning of surgery with A SIP OF WATER albuterol (PROVENTIL HFA;VENTOLIN HFA) diazepam (VALIUM) levothyroxine (SYNTHROID, LEVOTHROID) Marinol - if needed oxycodone (OXY-IR)  Promethazine (Phenergan) - if needed propranolol (INDERAL)  7 days prior to surgery STOP taking any Aspirin(unless otherwise instructed by your surgeon), Aleve, Naproxen, Ibuprofen, Motrin, Advil, Goody's, BC's, all herbal medications, fish oil, and all vitamins   WHAT DO I DO ABOUT MY DIABETES MEDICATION?   Marland Kitchen. Do not take oral diabetes medicines (pills) the morning of surgery.    How to Manage Your Diabetes Before and After Surgery  Why is it important to control my blood sugar before and after surgery? . Improving blood sugar levels before and after surgery helps healing and can limit problems. . A way of improving blood sugar control is eating a healthy diet by: o  Eating less sugar and carbohydrates o  Increasing activity/exercise o  Talking with your doctor about reaching your blood sugar goals . High blood sugars (greater than 180 mg/dL) can raise your  risk of infections and slow your recovery, so you will need to focus on controlling your diabetes during the weeks before surgery. . Make sure that the doctor who takes care of your diabetes knows about your planned surgery including the date and location.  How do I manage my blood sugar before surgery? . Check your blood sugar at least 4 times a day, starting 2 days before surgery, to make sure that the level is not too high or low. o Check your blood sugar the morning of your surgery when you wake up and every 2 hours until you get to the Short Stay unit. . If your blood sugar is less than 70 mg/dL, you will need to treat for low blood sugar: o Do not take insulin. o Treat a low blood sugar (less than 70 mg/dL) with  cup of clear juice (cranberry or apple), 4 glucose tablets, OR glucose gel. Recheck blood sugar in 15 minutes after treatment (to make sure it is greater than 70 mg/dL). If your blood sugar is not greater than 70 mg/dL on recheck, call [phone] o  for further instructions. . Report your blood sugar to the short stay nurse when you get to Short Stay.  . If you are admitted to the hospital after surgery: o Your blood sugar will be checked by the staff and you will probably be given insulin after surgery (instead of oral diabetes medicines) to make sure you have good blood sugar levels. o The goal for blood sugar control after surgery is 80-180 mg/dL.     Do not wear jewelry, make-up  or nail polish.  Do not wear lotions, powders, or perfumes, or deoderant.  Do not shave 48 hours prior to surgery.  Men may shave face and neck.  Do not bring valuables to the hospital.  [facility] is not responsible for any belongings or valuables.  Contacts, dentures or bridgework may not be worn into surgery.  Leave your suitcase in the car.  After surgery it may be brought to your room.  For patients admitted to the hospital, discharge time will be determined by your treatment  team.  Patients discharged the day of surgery will not be allowed to drive home.   Special instructions:   [facility]- Preparing For Surgery  Before surgery, you can play an important role. Because skin is not sterile, your skin needs to be as free of germs as possible. You can reduce the number of germs on your skin by washing with CHG (chlorahexidine gluconate) Soap before surgery.  CHG is an antiseptic cleaner which kills germs and bonds with the skin to continue killing germs even after washing.  Please do not use if you have an allergy to CHG or antibacterial soaps. If your skin becomes reddened/irritated stop using the CHG.  Do not shave (including legs and underarms) for at least 48 hours prior to first CHG shower. It is OK to shave your face.  Please follow these instructions carefully.   1. Shower the NIGHT BEFORE SURGERY and the MORNING OF SURGERY with CHG.   2. If you chose to wash your hair, wash your hair first as usual with your normal shampoo.  3. After you shampoo, rinse your hair and body thoroughly to remove the shampoo.  4. Use CHG as you would any other liquid soap. You can apply CHG directly to the skin and wash gently with a scrungie or a clean washcloth.   5. Apply the CHG Soap to your body ONLY FROM THE NECK DOWN.  Do not use on open wounds or open sores. Avoid contact with your eyes, ears, mouth and genitals (private parts). Wash Face and genitals (private parts)  with your normal soap.  6. Wash thoroughly, paying special attention to the area where your surgery will be performed.  7. Thoroughly rinse your body with warm water from the neck down.  8. DO NOT shower/wash with your normal soap after using and rinsing off the CHG Soap.  9. Pat yourself dry with a CLEAN TOWEL.  10. Wear CLEAN PAJAMAS to bed the night before surgery, wear comfortable clothes the morning of surgery  11. Place CLEAN SHEETS on your bed the night of your first shower and DO NOT SLEEP  WITH PETS.    Day of Surgery: Do not apply any deodorants/lotions. Please wear clean clothes to the hospital/surgery center.      Please read over the following fact sheets that you were given.

## 2016-12-20 ENCOUNTER — Encounter (HOSPITAL_COMMUNITY): Payer: Self-pay | Admitting: Emergency Medicine

## 2016-12-20 NOTE — Progress Notes (Signed)
Anesthesia chart review:  Patient is a 46 year old female scheduled for L5-S1 PLIF on 01/06/2017 with Tressie StalkerJeffrey Jenkins, M.D.  - PCP is Arnette FeltsMike Duran, PA - Neurologist is Lieutenant DiegoJessica Tate[facility] who sees pt for neuropsychological complaints  PMH includes: HTN, hyperlipidemia, DM, TIA, PVD (S/P aorta-superior mesenteric and aorta-renal artery bypass graft) hypothyroidism, CKD (stage III), asthma, anxiety, post-op N/V, GERD. Current smoker. BMI 23.  Medications include: Albuterol, Marinol, Lasix, HCTZ, levothyroxine, lisinopril, metformin, propranolol  BP 122/64   Pulse 69   Temp 37.5 C (Oral)   Resp 18   Ht 5\' 4"  (1.626 m)   Wt 136 lb 0.4 oz (61.7 kg)   SpO2 100%   BMI 23.35 kg/m   Preoperative labs reviewed.   - HbA1c 6.3, glucose 89  EKG 12/18/16: Sinus bradycardia (55 bpm)  Nuclear stress test 11/09/13 [facility] 1. Resting EKG shows normal sinus rhythm. 2. Resting and stress EKG showed normal ST segment; no ventricular tachycardia, significant QRS prolongation or heart block X line 3. Both the rest and stress images are within normal limits. No significant reversible ischemia or fixed scar. 4. Gated LVEF normal 65%. Normal LV segmental wall motion.  TEE 06/18/12 (done for TIA):  - Left ventricle: Systolic function was normal. The estimated ejection fraction was in the range of 55% to 60%. Wall motion was normal; there were no regional wallmotion abnormalities. - Left atrium: No evidence of thrombus in the atrial cavityor appendage. - Right atrium: No evidence of thrombus in the atrial cavityor appendage. - Atrial septum: No defect or patent foramen ovale wasidentified. Echo contrast study showed no right-to-leftatrial level shunt, at baseline or with provocation.  If no changes, I anticipate pt can proceed with surgery as scheduled.   Rica Mastngela French Kendra, FNP-BC [facility] Phone: [phone] 12/20/2016 2:18 PM

## 2017-01-06 ENCOUNTER — Inpatient Hospital Stay (HOSPITAL_COMMUNITY)
Admission: RE | Admit: 2017-01-06 | Payer: Managed Care, Other (non HMO) | Source: Ambulatory Visit | Admitting: Neurosurgery

## 2017-01-06 ENCOUNTER — Encounter (HOSPITAL_COMMUNITY): Admission: RE | Payer: Self-pay | Source: Ambulatory Visit

## 2017-01-06 SURGERY — POSTERIOR LUMBAR FUSION 1 LEVEL
Anesthesia: General

## 2017-03-20 ENCOUNTER — Other Ambulatory Visit: Payer: Self-pay | Admitting: Cardiology

## 2017-03-20 DIAGNOSIS — Z1231 Encounter for screening mammogram for malignant neoplasm of breast: Secondary | ICD-10-CM

## 2017-04-11 ENCOUNTER — Ambulatory Visit: Payer: Managed Care, Other (non HMO)

## 2017-04-25 ENCOUNTER — Ambulatory Visit
Admission: RE | Admit: 2017-04-25 | Discharge: 2017-04-25 | Disposition: A | Discharge status: Home / Self Care | Payer: Managed Care, Other (non HMO) | Source: Ambulatory Visit | Attending: Cardiology | Admitting: Cardiology

## 2017-04-25 DIAGNOSIS — Z1231 Encounter for screening mammogram for malignant neoplasm of breast: Secondary | ICD-10-CM

## 2017-12-05 ENCOUNTER — Other Ambulatory Visit: Payer: Self-pay | Admitting: Radiology

## 2017-12-05 DIAGNOSIS — N6310 Unspecified lump in the right breast, unspecified quadrant: Secondary | ICD-10-CM

## 2017-12-08 ENCOUNTER — Ambulatory Visit
Admission: RE | Admit: 2017-12-08 | Discharge: 2017-12-08 | Disposition: A | Discharge status: Home / Self Care | Payer: Managed Care, Other (non HMO) | Source: Ambulatory Visit | Attending: Radiology | Admitting: Radiology

## 2017-12-08 DIAGNOSIS — N6310 Unspecified lump in the right breast, unspecified quadrant: Secondary | ICD-10-CM

## 2018-07-29 ENCOUNTER — Other Ambulatory Visit: Payer: Self-pay | Admitting: Radiology

## 2018-07-29 DIAGNOSIS — N6320 Unspecified lump in the left breast, unspecified quadrant: Secondary | ICD-10-CM

## 2018-08-05 ENCOUNTER — Other Ambulatory Visit: Payer: Self-pay

## 2018-08-05 ENCOUNTER — Ambulatory Visit
Admission: RE | Admit: 2018-08-05 | Discharge: 2018-08-05 | Disposition: A | Discharge status: Home / Self Care | Payer: Managed Care, Other (non HMO) | Source: Ambulatory Visit | Attending: Radiology | Admitting: Radiology

## 2018-08-05 DIAGNOSIS — N6320 Unspecified lump in the left breast, unspecified quadrant: Secondary | ICD-10-CM

## 2021-11-19 LAB — HM COLONOSCOPY

## 2022-02-18 ENCOUNTER — Other Ambulatory Visit: Payer: Self-pay | Admitting: Cardiology

## 2022-02-18 DIAGNOSIS — N6320 Unspecified lump in the left breast, unspecified quadrant: Secondary | ICD-10-CM

## 2022-03-29 ENCOUNTER — Other Ambulatory Visit: Payer: Managed Care, Other (non HMO)

## 2022-08-30 LAB — MICROALBUMIN / CREATININE URINE RATIO: Microalb Creat Ratio: <30

## 2022-08-30 LAB — PROTEIN / CREATININE RATIO, URINE
Albumin, U: <3
Creatinine, Urine: 10.1

## 2022-09-16 ENCOUNTER — Ambulatory Visit (HOSPITAL_BASED_OUTPATIENT_CLINIC_OR_DEPARTMENT_OTHER): Payer: Self-pay | Admitting: Family Medicine

## 2022-09-18 ENCOUNTER — Ambulatory Visit (INDEPENDENT_AMBULATORY_CARE_PROVIDER_SITE_OTHER): Payer: Medicare Other | Admitting: Family Medicine

## 2022-09-18 ENCOUNTER — Encounter (HOSPITAL_BASED_OUTPATIENT_CLINIC_OR_DEPARTMENT_OTHER): Payer: Self-pay | Admitting: Family Medicine

## 2022-09-18 VITALS — BP 118/76 | HR 65 | Ht 64.0 in | Wt 152.0 lb

## 2022-09-18 DIAGNOSIS — R300 Dysuria: Secondary | ICD-10-CM | POA: Diagnosis not present

## 2022-09-18 DIAGNOSIS — F5101 Primary insomnia: Secondary | ICD-10-CM | POA: Insufficient documentation

## 2022-09-18 HISTORY — DX: Dysuria: R30.0

## 2022-09-18 LAB — POCT URINALYSIS DIP (CLINITEK)
Bilirubin, UA: NEGATIVE
Blood, UA: NEGATIVE
Glucose, UA: NEGATIVE mg/dL
Ketones, POC UA: NEGATIVE mg/dL
Leukocytes, UA: NEGATIVE
Nitrite, UA: NEGATIVE
POC PROTEIN,UA: NEGATIVE
Spec Grav, UA: <=1.005 — AB (ref 1.010–1.025)
Urobilinogen, UA: 0.2 E.U./dL
pH, UA: 6 (ref 5.0–8.0)

## 2022-09-18 LAB — COMPREHENSIVE METABOLIC PANEL
ALT: 12 IU/L (ref 0–32)
AST: 17 IU/L (ref 0–40)
Albumin: 4.2 g/dL (ref 3.9–4.9)
Alkaline Phosphatase: 110 IU/L (ref 44–121)
BUN/Creatinine Ratio: 5 — ABNORMAL LOW (ref 9–23)
BUN: 4 mg/dL — ABNORMAL LOW (ref 6–24)
Bilirubin Total: <0.2 mg/dL (ref 0.0–1.2)
CO2: 26 mmol/L (ref 20–29)
Calcium: 9 mg/dL (ref 8.7–10.2)
Chloride: 107 mmol/L — ABNORMAL HIGH (ref 96–106)
Creatinine, Ser: 0.88 mg/dL (ref 0.57–1.00)
Globulin, Total: 1.7 g/dL (ref 1.5–4.5)
Glucose: 77 mg/dL (ref 70–99)
Potassium: 4 mmol/L (ref 3.5–5.2)
Sodium: 141 mmol/L (ref 134–144)
Total Protein: 5.9 g/dL — ABNORMAL LOW (ref 6.0–8.5)
eGFR: 80 mL/min/{1.73_m2} (ref >59)

## 2022-09-18 MED ORDER — TRAZODONE HCL 100 MG PO TABS: 100.0000 mg | ORAL_TABLET | Freq: Every day | ORAL | 3 refills | Status: DC

## 2022-09-18 NOTE — Progress Notes (Signed)
New Patient Office Visit  Subjective:   Sabrina Roberson 1970-09-10 09/18/2022  Chief Complaint  Patient presents with   New Patient (Initial Visit)    Pt is here today to establish with the practice. States she has had urinary problems and also states she has had pain in her abdomen which radiates to her back. Also states she has an ulcer that she is going to see another provider about.    HPI: Sabrina Roberson presents today to establish care at [facility] Medicine at [facility]. Introduced to Publishing rights manager role and practice setting.  All questions answered.   Last PCP: Roxanne Mins, PA [facility] medical)  Last annual physical: Several years  Concerns: See below   URINARY SYMPTOMS  Onset: Over 1-2 years.   Having sharp pains to right flank. Pain increases with movement and has sudden impulses of pain. She reported hx of UTI in the past 2 weeks and went to [facility]. Treated with antibiotics. States she is still having pain, discoloration of urine and odor.  Reports mild dysuria and frequency with urination.  She is concerned about her findings of a renal cyst on a previous CT that she had with [facility] medical in September.  She reports she did have some intermittent fevers and chills.  Reports that she is having nausea and vomiting, however she believes this is related to ongoing GI issues that is being managed by [facility] health currently.  Recently had EGD on 08/27/2022.  She is a every day smoker of tobacco.  Pt had CT Abd, Pelvis with Contrast on 10/26/2021 due to sharp flank pains by [facility] provider. She brought copy to her visit. See scan in chart.  Impression from CT:  - A benign appearing 4.6cm cyst was present to the superior left kidney. Moderate atheromatous plaque of the aorta and iliac arteries present. No acute abnormality of the abdomen or pelvis seen. Postop changes from hysterectomy and duodenojejunostomy. . Moderate  retained colonic stool which may reflex constipation clinically.    INSOMNIA: Sabrina Roberson presents for the medical management of Insomnia. Pt would like to increase her Trazodone to assist with her insomnia. Trazodone 50mg  has worked well but has noticed she has become tolerant to the medication.  Duration: years Difficulty falling asleep: yes Difficulty staying asleep: yes Good sleep hygiene: yes Treatments tried:  Trazodone 50mg  currently    The following portions of the patient's history were reviewed and updated as appropriate: past medical history, past surgical history, family history, social history, allergies, medications, and problem list.   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   Chronic kidney disease (CKD), stage III (moderate) (HCC) 05/29/2011   Abdominal pain 02/11/2011   Fibromyalgia 02/11/2011   Sebaceous cyst of breast, right subaereolar 10/30/2010   Past Medical History:  Diagnosis Date   Allergy    Anxiety    Arthritis    Asthma    Bruises easily    Chronic chest wall pain    Chronic pain    Cough    Depression    Diabetes mellitus without complication (HCC)    Dyspnea  chronic   Encounter for therapeutic drug monitoring 02/11/2011   Fibromyalgia    Functional movement disorder    GERD (gastroesophageal reflux disease)    Headache    Hearing loss    High cholesterol    History of hiatal hernia    Hypertension    Hypothyroidism    Kidney disease    stage 3   Leg swelling    both   Nausea 02/11/2011   Nausea and vomiting 04/01/2013   Pain management    PONV (postoperative nausea and vomiting)     SMAS (superior mesenteric artery syndrome) (HCC)    Thyroid disease    TIA (transient ischemic attack)    Tic disorder    TMJ (dislocation of temporomandibular joint)    Transient cerebral ischemia    Past Surgical History:  Procedure Laterality Date   ABDOMINAL HYSTERECTOMY     AORTA - SUPERIOR MESENTERIC AND AORTA - RENAL ARTERY BYPASS GRAFT     APPENDECTOMY     BREAST CYST EXCISION Right    BREAST EXCISIONAL BIOPSY     CHOLECYSTECTOMY     cyst removed     HERNIA REPAIR     OOPHORECTOMY     bilateral   TEE WITHOUT CARDIOVERSION N/A 06/18/2012   Procedure: TRANSESOPHAGEAL ECHOCARDIOGRAM (TEE);  Surgeon: Thurmon Fair, MD;  Location: [facility] ENDOSCOPY;  Service: Cardiovascular;  Laterality: N/A;   Family History  Problem Relation Age of Onset   Hypertension Sister    Asthma Father    Cancer Paternal Grandmother        breast   Heart disease Paternal Grandmother    Breast cancer Paternal Grandmother    Pulmonary fibrosis Maternal Grandmother    Emphysema Maternal Grandmother    Heart disease Maternal Grandmother    Breast cancer Maternal Grandmother    Leukemia Paternal Grandfather    Heart disease Paternal Grandfather    Clotting disorder Paternal Grandfather    Parkinson's disease Maternal Aunt    Asthma Maternal Grandfather    Heart disease Maternal Grandfather    Social History   Socioeconomic History   Marital status: Married    Spouse name: Not on file   Number of children: 2   Years of education: Not on file   Highest education level: Not on file  Occupational History   Occupation: disabled  Tobacco Use   Smoking status: Every Day    Current packs/day: 0.50    Average packs/day: 0.5 packs/day for 15.0 years (7.5 ttl pk-yrs)    Types: Cigarettes   Smokeless tobacco: Never  Vaping Use   Vaping status: Former  Substance and Sexual Activity   Alcohol use: No    Alcohol/week: 0.0 standard drinks of alcohol   Drug use: No   Sexual activity: Never     Partners: Male  Other Topics Concern   Not on file  Social History Narrative   Not on file   Social Determinants of Health   Financial Resource Strain: Not on file  Food Insecurity: Not on file  Transportation Needs: Not on file  Physical Activity: Not on file  Stress: Not on file  Social Connections: Unknown (08/02/2022)   [facility] from [facility]   Social Network    Social Network: Not on file  Intimate Partner Violence: Unknown (08/02/2022)   Received from [facility]   HITS    Physically Hurt: Not on file    Insult or Talk Down To: Not on file    Threaten Physical  Harm: Not on file    Scream or Curse: Not on file   Outpatient Medications Prior to Visit  Medication Sig Dispense Refill   albuterol (PROVENTIL HFA;VENTOLIN HFA) 108 (90 BASE) MCG/ACT inhaler Inhale 2 puffs into the lungs every 6 (six) hours as needed. For shortness of breath     buPROPion ER (WELLBUTRIN SR) 100 MG 12 hr tablet Take by mouth.     diazepam (VALIUM) 5 MG tablet Take 5 mg by mouth 3 (three) times daily.      Dulaglutide (TRULICITY) 0.75 MG/0.5ML SOPN Inject into the skin.     gabapentin (NEURONTIN) 600 MG tablet Take 600 mg by mouth at bedtime.     levothyroxine (SYNTHROID, LEVOTHROID) 100 MCG tablet Take 100 mcg by mouth daily before breakfast.     metFORMIN (GLUCOPHAGE-XR) 500 MG 24 hr tablet Take 500 mg by mouth daily with breakfast.     methocarbamol (ROBAXIN) 500 MG tablet Take 2 tablets (1,000 mg total) by mouth 4 (four) times daily as needed for muscle spasms (muscle spasm/pain). 25 tablet 0   omeprazole (PRILOSEC) 20 MG capsule Take 20 mg by mouth daily.     promethazine (PHENERGAN) 25 MG tablet Take 25 mg by mouth every 6 (six) hours as needed for nausea or vomiting.     propranolol (INDERAL) 80 MG tablet Take 80 mg by mouth daily.      traZODone (DESYREL) 50 MG tablet Take 50 mg by mouth at bedtime.     Aspirin-Salicylamide-Caffeine (BC HEADACHE POWDER PO) Take 2 packets by mouth daily as  needed (general pain / headache).     buPROPion (ZYBAN) 150 MG 12 hr tablet 1 tablet once daily x 3 days, then increase to 1 tablet twice daily. Stop smoking after 1st week 60 tablet 1   dronabinol (MARINOL) 5 MG capsule Take 5 mg by mouth 4 (four) times daily as needed (for nausea and vomiting).      furosemide (LASIX) 20 MG tablet Take 1 tablet (20 mg total) by mouth daily. (Patient not taking: Reported on 09/18/2022) 10 tablet 0   hydrochlorothiazide (HYDRODIURIL) 12.5 MG tablet Take 1 tablet (12.5 mg total) by mouth daily. (Patient not taking: Reported on 10/09/2014) 30 tablet 0   lisinopril (PRINIVIL,ZESTRIL) 2.5 MG tablet Take 2.5 mg by mouth daily with supper.     oxycodone (OXY-IR) 5 MG capsule Take 5 mg by mouth every 4 (four) hours as needed for pain.      No facility-administered medications prior to visit.   Allergies  Allergen Reactions   Latex Anaphylaxis and Swelling   Codeine Nausea And Vomiting    Other reaction(s): GI Upset (intolerance), Vomiting (intolerance)   Ibuprofen Other (See Comments)    GI upset, burning sensation   Oxycodone Nausea And Vomiting    Pre-medication with phenergan needed.   Tape Itching and Rash    Please use "paper" tape only.    ROS: A complete ROS was performed with pertinent positives/negatives noted in the HPI. The remainder of the ROS are negative.   Objective:   Today's Vitals   09/18/22 1047  BP: 118/76  Pulse: 65  SpO2: 98%  Weight: 152 lb (68.9 kg)  Height: 5\' 4"  (1.626 m)    GENERAL: Well-appearing, in NAD. Well nourished.  SKIN: Pink, warm and dry. No rash, lesion, ulceration, or ecchymoses.  Head: Normocephalic. NECK: Trachea midline. Full ROM w/o pain or tenderness. No lymphadenopathy.  RESPIRATORY: Chest wall symmetrical. Respirations even and non-labored. Breath sounds  clear to auscultation bilaterally.  CARDIAC: S1, S2 present, regular rate and rhythm without murmur or gallops. Peripheral pulses 2+ bilaterally. GI:  Abdomen soft, non-tender. Hyperactive bowel sounds. No rebound tenderness. No hepatomegaly or splenomegaly. No CVA tenderness.   MSK: Muscle tone and strength appropriate for age. Joints w/o tenderness, redness, or swelling.  EXTREMITIES: Without clubbing, cyanosis, or edema.  NEUROLOGIC: No motor or sensory deficits. Steady, even gait. C2-C12 intact.  PSYCH/MENTAL STATUS: Alert, oriented x 3. Cooperative, appropriate mood and affect.    Results for orders placed or performed in visit on 09/18/22  POCT URINALYSIS DIP (CLINITEK)  Result Value Ref Range   Color, UA yellow yellow   Clarity, UA clear clear   Glucose, UA negative negative mg/dL   Bilirubin, UA negative negative   Ketones, POC UA negative negative mg/dL   Spec Grav, UA <=1.478 (A) 1.010 - 1.025   Blood, UA negative negative   pH, UA 6.0 5.0 - 8.0   POC PROTEIN,UA negative negative, trace   Urobilinogen, UA 0.2 0.2 or 1.0 E.U./dL   Nitrite, UA Negative Negative   Leukocytes, UA Negative Negative       Assessment & Plan:  1. Dysuria Urine is clear in office. No signs of hematuria, infection or protein. Will obtain Stat renal labs today with CMP for patient to evaluate renal function. Pending labs, will consider CT stone study or CT ABD/Pelvis w/ contrast given longevity of symptoms and hx of tobacco use. Records release obtained for [facility] and faxed.   - POCT URINALYSIS DIP (CLINITEK) - Lipid panel - CBC with Differential/Platelet - Hemoglobin A1c - TSH - Comprehensive metabolic panel   2. Primary Insomnia Increase trazodone to 100mg . New rx sent in for patient.  Safe use and possible side effects of this medication reviewed with patient and she verbalized understanding.  Will follow-up in 2 weeks to assess insomnia.  Patient to reach out to office if new, worrisome, or unresolved symptoms arise or if no improvement in patient's condition. Patient verbalized understanding and is agreeable to treatment plan.  All questions answered to patient's satisfaction.    Return in about 3 weeks (around 10/09/2022) for Follow up Chronic Conditions .   Of note, portions of this note may have been created with voice recognition software [employer], medical). While this note has been edited for accuracy, occasional wrong-word or 'sound-a-like' substitutions may have occurred due to the inherent limitations of voice recognition software.  Yolanda Manges, FNP

## 2022-09-19 LAB — LIPID PANEL
Chol/HDL Ratio: 5 ratio — ABNORMAL HIGH (ref 0.0–4.4)
Cholesterol, Total: 203 mg/dL — ABNORMAL HIGH (ref 100–199)
HDL: 41 mg/dL (ref >39)
LDL Chol Calc (NIH): 136 mg/dL — ABNORMAL HIGH (ref 0–99)
Triglycerides: 145 mg/dL (ref 0–149)
VLDL Cholesterol Cal: 26 mg/dL (ref 5–40)

## 2022-09-19 LAB — CBC WITH DIFFERENTIAL/PLATELET
Basophils Absolute: 0 10*3/uL (ref 0.0–0.2)
Basos: 0 %
EOS (ABSOLUTE): 0.1 10*3/uL (ref 0.0–0.4)
Eos: 2 %
Hematocrit: 38.2 % (ref 34.0–46.6)
Hemoglobin: 12.6 g/dL (ref 11.1–15.9)
Immature Grans (Abs): 0 10*3/uL (ref 0.0–0.1)
Immature Granulocytes: 0 %
Lymphocytes Absolute: 2.1 10*3/uL (ref 0.7–3.1)
Lymphs: 28 %
MCH: 30.7 pg (ref 26.6–33.0)
MCHC: 33 g/dL (ref 31.5–35.7)
MCV: 93 fL (ref 79–97)
Monocytes Absolute: 0.5 10*3/uL (ref 0.1–0.9)
Monocytes: 7 %
Neutrophils Absolute: 4.7 10*3/uL (ref 1.4–7.0)
Neutrophils: 63 %
Platelets: 382 10*3/uL (ref 150–450)
RBC: 4.1 x10E6/uL (ref 3.77–5.28)
RDW: 12.2 % (ref 11.7–15.4)
WBC: 7.5 10*3/uL (ref 3.4–10.8)

## 2022-09-19 LAB — HEMOGLOBIN A1C
Est. average glucose Bld gHb Est-mCnc: 94 mg/dL
Hgb A1c MFr Bld: 4.9 % (ref 4.8–5.6)

## 2022-09-19 LAB — TSH: TSH: 13 u[IU]/mL — ABNORMAL HIGH (ref 0.450–4.500)

## 2022-09-19 NOTE — Progress Notes (Signed)
Please let patient know that with her lab work, her TSH shows her hypothyroidism is uncontrolled currently.  She currently taking her levothyroxine 100 mcg daily?  If so, we do need to increase her dosage.  If she continuing to have the back pain?  And if so is it bilateral or only on the right side?  If so we can go ahead and proceed with a CT stone study if she would like.

## 2022-09-23 ENCOUNTER — Other Ambulatory Visit (HOSPITAL_BASED_OUTPATIENT_CLINIC_OR_DEPARTMENT_OTHER): Payer: Self-pay | Admitting: Family Medicine

## 2022-09-23 DIAGNOSIS — R300 Dysuria: Secondary | ICD-10-CM

## 2022-09-23 DIAGNOSIS — R109 Unspecified abdominal pain: Secondary | ICD-10-CM

## 2022-09-23 DIAGNOSIS — E039 Hypothyroidism, unspecified: Secondary | ICD-10-CM

## 2022-09-23 MED ORDER — LEVOTHYROXINE SODIUM 125 MCG PO TABS: 125.0000 ug | ORAL_TABLET | Freq: Every day | ORAL | 3 refills | Status: DC

## 2022-09-24 ENCOUNTER — Other Ambulatory Visit: Payer: Self-pay | Admitting: Family Medicine

## 2022-09-24 DIAGNOSIS — N6320 Unspecified lump in the left breast, unspecified quadrant: Secondary | ICD-10-CM

## 2022-10-01 ENCOUNTER — Encounter (HOSPITAL_BASED_OUTPATIENT_CLINIC_OR_DEPARTMENT_OTHER): Payer: Self-pay | Admitting: Family Medicine

## 2022-10-01 DIAGNOSIS — E118 Type 2 diabetes mellitus with unspecified complications: Secondary | ICD-10-CM | POA: Insufficient documentation

## 2022-10-01 DIAGNOSIS — E1122 Type 2 diabetes mellitus with diabetic chronic kidney disease: Secondary | ICD-10-CM | POA: Insufficient documentation

## 2022-10-02 ENCOUNTER — Encounter (HOSPITAL_BASED_OUTPATIENT_CLINIC_OR_DEPARTMENT_OTHER): Payer: Self-pay | Admitting: Family Medicine

## 2022-10-02 DIAGNOSIS — I10 Essential (primary) hypertension: Secondary | ICD-10-CM | POA: Insufficient documentation

## 2022-10-03 ENCOUNTER — Ambulatory Visit: Admission: RE | Admit: 2022-10-03 | Payer: Medicare Other | Source: Ambulatory Visit

## 2022-10-03 ENCOUNTER — Ambulatory Visit
Admission: RE | Admit: 2022-10-03 | Discharge: 2022-10-03 | Disposition: A | Discharge status: Home / Self Care | Payer: Medicare Other | Source: Ambulatory Visit | Attending: Family Medicine | Admitting: Family Medicine

## 2022-10-03 ENCOUNTER — Ambulatory Visit
Admission: RE | Admit: 2022-10-03 | Discharge: 2022-10-03 | Disposition: A | Discharge status: Home / Self Care | Payer: Medicare Other | Source: Ambulatory Visit | Attending: Cardiology | Admitting: Cardiology

## 2022-10-03 DIAGNOSIS — N6320 Unspecified lump in the left breast, unspecified quadrant: Secondary | ICD-10-CM

## 2022-10-03 DIAGNOSIS — R109 Unspecified abdominal pain: Secondary | ICD-10-CM

## 2022-10-03 DIAGNOSIS — R300 Dysuria: Secondary | ICD-10-CM

## 2022-10-03 NOTE — Progress Notes (Signed)
Hi Sally, Your CT stone study does not show any evidence of renal stones, kidney infection or malignant appearance of the cyst seen on your left kidney.  If you are still having urinary concerns and symptoms, I would recommend a referral to a urologist in the area.  The CT did show chronic degenerative disc disease which was already present.  If you would like referral please let us know and we would be happy to send one in for you

## 2022-10-03 NOTE — Progress Notes (Signed)
Sabrina Roberson,   Your mammogram results show no evidence of breast cancer. We will plan to repeat this in 1 year for routine screening.  If you should have concerns or changes in your breasts within the next year, please let us know.   Jerre Simon, FNP-C

## 2022-10-10 ENCOUNTER — Ambulatory Visit (INDEPENDENT_AMBULATORY_CARE_PROVIDER_SITE_OTHER): Payer: Medicare Other | Admitting: Family Medicine

## 2022-10-10 ENCOUNTER — Ambulatory Visit (HOSPITAL_BASED_OUTPATIENT_CLINIC_OR_DEPARTMENT_OTHER): Payer: Medicare Other | Admitting: Family Medicine

## 2022-10-10 VITALS — BP 124/69 | HR 66 | Ht 64.0 in | Wt 149.8 lb

## 2022-10-10 DIAGNOSIS — E118 Type 2 diabetes mellitus with unspecified complications: Secondary | ICD-10-CM

## 2022-10-10 DIAGNOSIS — E782 Mixed hyperlipidemia: Secondary | ICD-10-CM | POA: Diagnosis not present

## 2022-10-10 DIAGNOSIS — F411 Generalized anxiety disorder: Secondary | ICD-10-CM

## 2022-10-10 DIAGNOSIS — E039 Hypothyroidism, unspecified: Secondary | ICD-10-CM | POA: Insufficient documentation

## 2022-10-10 DIAGNOSIS — R197 Diarrhea, unspecified: Secondary | ICD-10-CM | POA: Diagnosis not present

## 2022-10-10 LAB — POCT URINE DRUG SCREEN
Methylenedioxyamphetamine: NOT DETECTED
POC BENZODIAZEPINES UR: NOT DETECTED
POC Cocaine UR: NOT DETECTED
POC Marijuana UR: NOT DETECTED
POC Oxycodone UR: NOT DETECTED

## 2022-10-10 MED ORDER — DICYCLOMINE HCL 10 MG PO CAPS: 10.0000 mg | ORAL_CAPSULE | Freq: Three times a day (TID) | ORAL | 1 refills | Status: DC

## 2022-10-10 MED ORDER — DIAZEPAM 5 MG PO TABS
5.0000 mg | ORAL_TABLET | Freq: Three times a day (TID) | ORAL | 3 refills | Status: DC | PRN
Start: 2022-10-10 — End: 2023-02-17

## 2022-10-10 MED ORDER — GABAPENTIN 600 MG PO TABS: 600.0000 mg | ORAL_TABLET | Freq: Every day | ORAL | 3 refills | Status: DC

## 2022-10-10 MED ORDER — PROPRANOLOL HCL 80 MG PO TABS: 80.0000 mg | ORAL_TABLET | Freq: Every day | ORAL | 3 refills | Status: DC

## 2022-10-10 MED ORDER — PROMETHAZINE HCL 25 MG PO TABS: 25.0000 mg | ORAL_TABLET | Freq: Three times a day (TID) | ORAL | 3 refills | Status: DC | PRN

## 2022-10-10 NOTE — Progress Notes (Signed)
Subjective:   Sabrina Roberson 12-03-1970 10/10/2022  Chief Complaint  Patient presents with   Medical Management of Chronic Issues    3-week follow up; pt has been using a heating pad as she is still having lower back pain as well as pain that radiates to her abdomen and states she is also cramping.    HPI: Sabrina Roberson presents today for re-assessment and management of chronic medical conditions.  Abdominal Pain:  Location: Lower back and radiates into her lower abdomen bilaterally. She is concerned for diverticular disease due to recurrent of diarrhea with eating. She had an unremarkable CT Stone study on 10/03/2022 due to chronic flank pain and urinary concerns. She does have an appt on 10/22/22 with [facility].  Recent Injury: No Character: "Ringing and twisting pain"  Aggravating Factors: Movement, Eating  Alleviating Factors: Heating pad  ASSOCIATED SYMPTOMS:  Chills: no Dysuria: no Abdominal Pain: yes Nausea: yes Vomiting: no Hematuria: no Dizziness: no Bowel or Bladder Dysfunction: yes, increase in diarrhea  Numbness: no Weakness: yes    HYPOTHYROIDISM: Patient presents for the medical management of Hypothyroidism. Current medication: Levothyroxine (after 09/23/22)  Patient compliant with medication regimen: yes  Fatigue: no Cold intolerance: no Weight gain/loss: no Constipation: no Lower extremity edema: no Palpitations: no Hoarseness: no Neck Pain/Compression: no Difficulty Swallowing: no  Lab Results  Component Value Date   TSH 13.000 (H) 09/18/2022    DIABETES MELLITUS: Sabrina Roberson presents for the medical management of diabetes.  Current diabetes medication regimen: Metformin 500mg  and Trulicity 0.75mg   Patient is  adhering to a diabetic diet.  Patient is  exercising regularly.  Patient is not checking BS regularly.  Patient is  checking their feet regularly.  Denies polydipsia, polyphagia, polyuria, open wounds or  ulcers on feet.  Lab Results  Component Value Date   HGBA1C 4.9 09/18/2022     Urine Albumin [facility] 08/30/22) Creatinine 10.1 Urine albumin less than 3.0 Alb/Creatinine less than 30  Wt Readings from Last 3 Encounters:  10/10/22 149 lb 12.8 oz (67.9 kg)  09/18/22 152 lb (68.9 kg)  12/18/16 136 lb 0.4 oz (61.7 kg)    HYPERLIPIDEMIA: Sabrina Roberson presents for the medical management of hyperlipidemia.  Patient's current HLD regimen is: none Adhering to heathy diet: Yes Exercising regularly: Yes Patient does have daily tobacco use.  Lab Results  Component Value Date   CHOL 203 (H) 09/18/2022   HDL 41 09/18/2022   LDLCALC 136 (H) 09/18/2022   TRIG 145 09/18/2022   CHOLHDL 5.0 (H) 09/18/2022    The 10-year ASCVD risk score (Arnett DK, et al., 2019) is: 12.2%   Values used to calculate the score:     Age: 52 years     Sex: Female     Is Non-Hispanic African American: No     Diabetic: Yes     Tobacco smoker: Yes     Systolic Blood Pressure: 124 mmHg     Is BP treated: Yes     HDL Cholesterol: 41 mg/dL     Total Cholesterol: 203 mg/dL  ANXIETY: Sabrina Roberson presents for the medical management of anxiety.  Current medication regimen: Valium TID PRN (been medication for several years and is dependent) and Wellbutrin  Counseling: Recommended  Well controlled: Yes per patient Denies SI/HI.   Insomnia: Patient did tolerate increase of Trazodone to 100mg  nightly PRN. She states she is falling asleep, but is awoken by abdominal  pain. Denies adverse effects at this time.      10/10/2022    2:51 PM 09/18/2022   11:02 AM  GAD 7 : Generalized Anxiety Score  Nervous, Anxious, on Edge 3 3  Control/stop worrying 2 3  Worry too much - different things 3 3  Trouble relaxing 1 3  Restless 0 2  Easily annoyed or irritable 2 3  Afraid - awful might happen 3 3  Total GAD 7 Score 14 20  Anxiety Difficulty Not difficult at all Very difficult      10/10/2022    2:51  PM 09/18/2022   11:01 AM  PHQ9 SCORE ONLY  PHQ-9 Total Score 17 15    The following portions of the patient's history were reviewed and updated as appropriate: past medical history, past surgical history, family history, social history, allergies, medications, and problem list.   Patient Active Problem List   Diagnosis Date Noted   Mixed hyperlipidemia 10/10/2022   GAD (generalized anxiety disorder) 10/10/2022   Acquired hypothyroidism 10/10/2022   Hypertension    Type 2 diabetes mellitus with complications (HCC)    Primary insomnia 09/18/2022   Dysuria 09/18/2022   Abnormal CT of the chest 02/04/2014   DOE (dyspnea on exertion) 12/21/2013   Benzodiazepine dependence, episodic (HCC) 12/23/2011   DDD (degenerative disc disease), cervical 12/23/2011   DDD (degenerative disc disease), lumbosacral 12/23/2011   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   Chronic kidney disease (CKD), stage III (moderate) (HCC) 05/29/2011   Abdominal pain 02/11/2011   Fibromyalgia 02/11/2011   Sebaceous cyst of breast, right subaereolar 10/30/2010   Past Medical History:  Diagnosis Date   Allergy    Anxiety    Arthritis    Asthma    Bruises easily    Chronic chest wall pain    Chronic pain    Cough    Depression    Diabetes mellitus without complication (HCC)    Dyspnea    chronic   Encounter for therapeutic drug monitoring 02/11/2011   Fibromyalgia    Functional movement disorder    GERD (gastroesophageal reflux disease)    Headache    Hearing loss    High cholesterol    History of hiatal hernia    Hypertension    Hypothyroidism    Kidney disease    stage 3   Leg swelling    both   Nausea 02/11/2011   Nausea and vomiting 04/01/2013   Pain management    PONV (postoperative nausea and vomiting)    SMAS (superior mesenteric artery syndrome) (HCC)    Thyroid disease    TIA (transient ischemic attack)    Tic disorder    TMJ (dislocation of temporomandibular joint)    Transient  cerebral ischemia    Type 2 diabetes mellitus with complications (HCC)    Past Surgical History:  Procedure Laterality Date   ABDOMINAL HYSTERECTOMY     AORTA - SUPERIOR MESENTERIC AND AORTA - RENAL ARTERY BYPASS GRAFT     APPENDECTOMY     BREAST CYST EXCISION Right    BREAST EXCISIONAL BIOPSY     CHOLECYSTECTOMY     cyst removed     HERNIA REPAIR     OOPHORECTOMY     bilateral   TEE WITHOUT CARDIOVERSION N/A 06/18/2012   Procedure: TRANSESOPHAGEAL ECHOCARDIOGRAM (TEE);  Surgeon: Thurmon Fair, MD;  [facility] ENDOSCOPY;  Service: Cardiovascular;  Laterality: N/A;   Family History  Problem Relation Age of Onset   Hypertension Sister  Asthma Father    Cancer Paternal Grandmother        breast   Heart disease Paternal Grandmother    Breast cancer Paternal Grandmother    Pulmonary fibrosis Maternal Grandmother    Emphysema Maternal Grandmother    Heart disease Maternal Grandmother    Breast cancer Maternal Grandmother    Leukemia Paternal Grandfather    Heart disease Paternal Grandfather    Clotting disorder Paternal Grandfather    Parkinson's disease Maternal Aunt    Asthma Maternal Grandfather    Heart disease Maternal Grandfather    Outpatient Medications Prior to Visit  Medication Sig Dispense Refill   albuterol (PROVENTIL HFA;VENTOLIN HFA) 108 (90 BASE) MCG/ACT inhaler Inhale 2 puffs into the lungs every 6 (six) hours as needed. For shortness of breath     buPROPion ER (WELLBUTRIN SR) 100 MG 12 hr tablet Take by mouth.     levothyroxine (SYNTHROID) 125 MCG tablet Take 1 tablet (125 mcg total) by mouth daily before breakfast. 60 tablet 3   metFORMIN (GLUCOPHAGE-XR) 500 MG 24 hr tablet Take 500 mg by mouth daily with breakfast.     methocarbamol (ROBAXIN) 500 MG tablet Take 2 tablets (1,000 mg total) by mouth 4 (four) times daily as needed for muscle spasms (muscle spasm/pain). 25 tablet 0   omeprazole (PRILOSEC) 20 MG capsule Take 20 mg by mouth daily.     traZODone  (DESYREL) 100 MG tablet Take 1 tablet (100 mg total) by mouth at bedtime. 30 tablet 3   diazepam (VALIUM) 5 MG tablet Take 5 mg by mouth 3 (three) times daily.      Dulaglutide (TRULICITY) 0.75 MG/0.5ML SOPN Inject into the skin.     gabapentin (NEURONTIN) 600 MG tablet Take 600 mg by mouth at bedtime.     promethazine (PHENERGAN) 25 MG tablet Take 25 mg by mouth every 6 (six) hours as needed for nausea or vomiting.     propranolol (INDERAL) 80 MG tablet Take 80 mg by mouth daily.      No facility-administered medications prior to visit.   Allergies  Allergen Reactions   Latex Anaphylaxis and Swelling   Codeine Nausea And Vomiting    Other reaction(s): GI Upset (intolerance), Vomiting (intolerance)   Ibuprofen Other (See Comments)    GI upset, burning sensation   Oxycodone Nausea And Vomiting    Pre-medication with phenergan needed.   Tape Itching and Rash    Please use "paper" tape only.     ROS: A complete ROS was performed with pertinent positives/negatives noted in the HPI. The remainder of the ROS are negative.    Objective:   Today's Vitals   10/10/22 1447  BP: 124/69  Pulse: 66  SpO2: 100%  Weight: 149 lb 12.8 oz (67.9 kg)  Height: 5\' 4"  (1.626 m)    Physical Exam          GENERAL: Well-appearing, in NAD. Well nourished.  SKIN: Pink, warm and dry. No rash, lesion, ulceration, or ecchymoses.  Head: Normocephalic. NECK: Trachea midline. Full ROM w/o pain or tenderness. No lymphadenopathy.  EARS: Tympanic membranes are intact, translucent without bulging and without drainage. Appropriate landmarks visualized.  EYES: Conjunctiva clear without exudates. EOMI, PERRL, no drainage present.  NOSE: Septum midline w/o deformity. Nares patent, mucosa pink and non-inflamed w/o drainage. No sinus tenderness.  RESPIRATORY: Chest wall symmetrical. Respirations even and non-labored. Breath sounds clear to auscultation bilaterally.  CARDIAC: S1, S2 present, regular rate and  rhythm without murmur or gallops.  Peripheral pulses 2+ bilaterally.  GI: Abdomen soft, tender to epigastric area. Normoactive bowel sounds. No rebound tenderness. No hepatomegaly or splenomegaly. No CVA tenderness.  MSK: Muscle tone and strength appropriate for age. Joints w/o tenderness, redness, or swelling.  EXTREMITIES: Without clubbing, cyanosis, or edema.  NEUROLOGIC: No motor or sensory deficits. Steady, even gait. C2-C12 intact.  PSYCH/MENTAL STATUS: Alert, oriented x 3. Cooperative, appropriate mood and affect.   Diabetic Foot Form - Detailed   Diabetic Foot Exam - detailed Diabetic Foot exam was performed with the following findings: Yes 10/10/2022  3:05 PM  Visual Foot Exam completed.: Yes  Can the patient see the bottom of their feet?: Yes Are the shoes appropriate in style and fit?: Yes Is there swelling or and abnormal foot shape?: No Is there a claw toe deformity?: No Is there elevated skin temparature?: No Is there foot or ankle muscle weakness?: No Normal Range of Motion: Yes Pulse Foot Exam completed.: Yes   Right posterior Tibialias: Present Left posterior Tibialias: Present   Right Dorsalis Pedis: Present Left Dorsalis Pedis: Present  Sensory Foot Exam Completed.: Yes Semmes-Weinstein Monofilament Test R Site 1-Great Toe: Neg L Site 1-Great Toe: Neg           Results for orders placed or performed in visit on 10/10/22  POCT Urine Drug Screen  Result Value Ref Range   POC Methamphetamine UR     POC Opiate Ur     POC Barbiturate UR     POC Amphetamine UR     POC Oxycodone UR None Detected None Detected   POC Cocaine UR None Detected None Detected   POC Ecstasy UR     POC TRICYCLICS UR     POC PHENCYCLIDINE UR     POC Marijuana UR None Detected None Detected   POC Methadone UR     POC BENZODIAZEPINES UR None Detected None Detected   URINE TEMPERATURE     POC DRUG SCREEN OXIDANTS URINE     POC SPECIFIC GRAVITY URINE     POC PH URINE      Methylenedioxyamphetamine None Detected None Detected        Assessment & Plan:  1. Type 2 diabetes mellitus with complications (HCC) Well controlled. PCP concerned for hypoglycemic episodes with A1C of 4.9. Pt to stop Trulicity. Continue Metformin as prescribed. Recommend checking glucose daily and keeping log. Opthalmology referral placed for diabetic eye exam. Foot exam completed. Urine albumin completed in August with [facility]. Discussed possibility of stopping metformin in the future if able to be diet controlled. Will check A1C in 6 months.   - Ambulatory referral to Ophthalmology  2. GAD (generalized anxiety disorder) Controlled per patient. UDS negative in office today. Controlled substance agreement signed in office. Safe use of medication reviewed with patient and she verbalized understanding. Continue Welbutrin as well. Follow up 6 months or sooner if needed.  - diazepam (VALIUM) 5 MG tablet; Take 1 tablet (5 mg total) by mouth 3 (three) times daily as needed for anxiety.  Dispense: 30 tablet; Refill: 3 - POCT Urine Drug Screen  3. Diarrhea, unspecified type Possibly related to previous surgery versus functional diarrhea. Will trial Bentyl 10mg  AC/HS and see GI as scheduled.  4. Mixed hyperlipidemia Discussed need for tobacco cessation and dietary and lifestyle changes. Recommend statin therapy, pt would like to trial diet and exercise x 6 months first and recheck. Understanding of risk and will recheck LP in 6 months.   5. Acquired hypothyroidism  Pt tolerating increased levothyroxine at this time. Will scheduled lab visit in 4 weeks for TSH recheck.    Meds ordered this encounter  Medications   dicyclomine (BENTYL) 10 MG capsule    Sig: Take 1 capsule (10 mg total) by mouth 4 (four) times daily -  before meals and at bedtime.    Dispense:  60 capsule    Refill:  1    Order Specific Question:   Supervising Provider    Answer:   DE [place], RAYMOND J [[id number]]    diazepam (VALIUM) 5 MG tablet    Sig: Take 1 tablet (5 mg total) by mouth 3 (three) times daily as needed for anxiety.    Dispense:  30 tablet    Refill:  3    Order Specific Question:   Supervising Provider    Answer:   DE [place], RAYMOND J [[id number]]   gabapentin (NEURONTIN) 600 MG tablet    Sig: Take 1 tablet (600 mg total) by mouth at bedtime.    Dispense:  60 tablet    Refill:  3    Order Specific Question:   Supervising Provider    Answer:   DE [place], RAYMOND J [[id number]]   propranolol (INDERAL) 80 MG tablet    Sig: Take 1 tablet (80 mg total) by mouth daily.    Dispense:  90 tablet    Refill:  3    Order Specific Question:   Supervising Provider    Answer:   DE [place], RAYMOND J [[id number]]   promethazine (PHENERGAN) 25 MG tablet    Sig: Take 1 tablet (25 mg total) by mouth every 8 (eight) hours as needed for nausea or vomiting.    Dispense:  30 tablet    Refill:  3    Order Specific Question:   Supervising Provider    Answer:   DE [place], RAYMOND J [id number]   Lab Orders         POCT Urine Drug Screen     Return for 2 appts:Lab visit in 4 weeks and Follow up in 6 months HTN, DM.    Patient to reach out to office if new, worrisome, or unresolved symptoms arise or if no improvement in patient's condition. Patient verbalized understanding and is agreeable to treatment plan. All questions answered to patient's satisfaction.   A total of 45 minutes were spent on this encounter today. When total time is documented, this includes both the face-to-face and non-face-to-face time personally spent before, during and after the visit on the date of the encounter.   Of note, portions of this note may have been created with voice recognition software [employer], medical). While this note has been edited for accuracy, occasional wrong-word or 'sound-a-like' substitutions may have occurred due to the inherent limitations of voice recognition software.  Yolanda Manges, FNP

## 2022-10-10 NOTE — Patient Instructions (Signed)
Stop your Trulicity. Continue your metformin.   Start Omega 3 Fish Oil supplement for your cholesterol

## 2022-10-21 ENCOUNTER — Telehealth (HOSPITAL_BASED_OUTPATIENT_CLINIC_OR_DEPARTMENT_OTHER): Payer: Self-pay | Admitting: Family Medicine

## 2022-10-21 NOTE — Telephone Encounter (Signed)
Pt states that Saturday she experienced some strange things going on --pain, bumping into walls, left side weakness., side pain, neck pain   Pain now is only in the back   Please call the patient to discuss --

## 2022-10-22 NOTE — Telephone Encounter (Signed)
Attempted to call pt but unable to reach. Left message for her to return call.

## 2022-11-04 ENCOUNTER — Encounter (HOSPITAL_BASED_OUTPATIENT_CLINIC_OR_DEPARTMENT_OTHER): Payer: Self-pay | Admitting: Family Medicine

## 2022-11-04 ENCOUNTER — Ambulatory Visit (HOSPITAL_BASED_OUTPATIENT_CLINIC_OR_DEPARTMENT_OTHER): Payer: Medicare Other | Admitting: Family Medicine

## 2022-11-04 ENCOUNTER — Other Ambulatory Visit (INDEPENDENT_AMBULATORY_CARE_PROVIDER_SITE_OTHER): Payer: Medicare Other

## 2022-11-04 VITALS — BP 128/85 | HR 67 | Ht 64.0 in | Wt 151.9 lb

## 2022-11-04 DIAGNOSIS — E039 Hypothyroidism, unspecified: Secondary | ICD-10-CM

## 2022-11-04 DIAGNOSIS — R600 Localized edema: Secondary | ICD-10-CM | POA: Diagnosis not present

## 2022-11-04 DIAGNOSIS — E118 Type 2 diabetes mellitus with unspecified complications: Secondary | ICD-10-CM

## 2022-11-04 DIAGNOSIS — R079 Chest pain, unspecified: Secondary | ICD-10-CM

## 2022-11-04 DIAGNOSIS — R0989 Other specified symptoms and signs involving the circulatory and respiratory systems: Secondary | ICD-10-CM | POA: Insufficient documentation

## 2022-11-04 DIAGNOSIS — E782 Mixed hyperlipidemia: Secondary | ICD-10-CM

## 2022-11-04 DIAGNOSIS — R55 Syncope and collapse: Secondary | ICD-10-CM

## 2022-11-04 DIAGNOSIS — N182 Chronic kidney disease, stage 2 (mild): Secondary | ICD-10-CM | POA: Diagnosis not present

## 2022-11-04 MED ORDER — BUPROPION HCL ER (SR) 100 MG PO TB12: 100.0000 mg | ORAL_TABLET | Freq: Every day | ORAL | 3 refills | Status: DC

## 2022-11-04 MED ORDER — ATORVASTATIN CALCIUM 20 MG PO TABS: 20.0000 mg | ORAL_TABLET | Freq: Every evening | ORAL | 3 refills | Status: DC

## 2022-11-04 MED ORDER — METFORMIN HCL ER 500 MG PO TB24: 500.0000 mg | ORAL_TABLET | Freq: Every day | ORAL | 3 refills | Status: DC

## 2022-11-04 NOTE — Progress Notes (Signed)
Subjective:   Sabrina Roberson 29-Jun-1970 11/04/2022  Chief Complaint  Patient presents with   Edema    Patient has been having problems with swelling in feet and ankles. From the swelling, states that she has been having problems walking.  Also has had some palpitations off and on for the past 3 weeks.     HPI: Sabrina Roberson presents today for re-assessment and management of chronic medical conditions.   LEG Swelling:  Patient states ongoing bilateral leg swelling since 9/31/24. She has tried Lasix without improvement. She states she is having pain from right calf radiating into her right foot. She was given Lasix 40mg  by prior PCP to use as needed. She has taken Lasix 5x in the past week. She has been elevating her feet frequently. She states it feels like "she has bricks on her feet" when walking. She reports previous vascular studies of legs in the past that were unremarkable.   Palpitations:  Reports mid sternal stabbing chest pain and palpitations ongoing x 3 weeks. Denies cough, SHOB.  Patient reports approximately 3 weeks ago she had a near syncopal episode in which she had bilateral weakness to her lower extremities, felt lightheaded and woke up to her family standing over her.  She denied any slurred speech, confusion, vision changes or residual weakness.  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   Wt Readings from Last 3 Encounters:  11/04/22 151 lb 14.4 oz (68.9 kg)  10/10/22 149 lb 12.8 oz (67.9 kg)  09/18/22 152 lb (68.9 kg)   HYPOTHYROIDISM: Patient presents for the medical management of Hypothyroidism. Current medication: Levothyroxine ( increased from )  Patient compliant with medication regimen: yes  Fatigue: no Cold intolerance: no Weight gain/loss: no Constipation: no Lower extremity edema: yes Palpitations: yes Hoarseness: no Neck Pain/Compression: no Difficulty Swallowing: no  Lab Results   Component Value Date   TSH 13.000 (H) 09/18/2022   HYPERLIPIDEMIA: Sabrina Roberson presents for the medical management of hyperlipidemia.  Patient's current HLD regimen is: Diet, Exercise Patient is not currently taking prescribed medications for HLD.  Adhering to heathy diet: Yes Exercising regularly: No Denies myalgias.  Lab Results  Component Value Date   CHOL 203 (H) 09/18/2022   HDL 41 09/18/2022   LDLCALC 136 (H) 09/18/2022   TRIG 145 09/18/2022   CHOLHDL 5.0 (H) 09/18/2022   DIABETES MELLITUS: Sabrina Roberson presents for the medical management of diabetes.  Current diabetes medication regimen: Metformin 500mg  daily Patient is  adhering to a diabetic diet.  Patient is not exercising regularly.  Patient is  checking BS regularly.  Patient is  checking their feet regularly.  Denies polydipsia, polyphagia, polyuria, open wounds or ulcers on feet.   Lab Results  Component Value Date   HGBA1C 4.9 09/18/2022     Wt Readings from Last 3 Encounters:  11/04/22 151 lb 14.4 oz (68.9 kg)  10/10/22 149 lb 12.8 oz (67.9 kg)  09/18/22 152 lb (68.9 kg)    CHRONIC KIDNEY DISEASE: Sabrina Roberson presents for the medical management of Chronic Kidney Disease.  Patient is  adhering to renal diet. Patient is not on ACE1/ARB therapy.  Patient is  avoiding NSAIDS.    She does not attend dialysis and does not perform peritoneal dialysis.   Lab Results  Component Value Date   NA 141 09/18/2022   K 4.0 09/18/2022   CO2 26 09/18/2022  GLUCOSE 77 09/18/2022   BUN 4 (L) 09/18/2022   CREATININE 0.88 09/18/2022   CALCIUM 9.0 09/18/2022   EGFR 80 09/18/2022   GFRNONAA >60 12/18/2016     The following portions of the patient's history were reviewed and updated as appropriate: past medical history, past surgical history, family history, social history, allergies, medications, and problem list.   Patient Active Problem List   Diagnosis Date Noted   Mixed hyperlipidemia  10/10/2022   GAD (generalized anxiety disorder) 10/10/2022   Acquired hypothyroidism 10/10/2022   Hypertension    Type 2 diabetes mellitus with complications (HCC)    Primary insomnia 09/18/2022   Dysuria 09/18/2022   Abnormal CT of the chest 02/04/2014   DOE (dyspnea on exertion) 12/21/2013   Benzodiazepine dependence, episodic (HCC) 12/23/2011   DDD (degenerative disc disease), cervical 12/23/2011   DDD (degenerative disc disease), lumbosacral 12/23/2011   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   Chronic kidney disease (CKD), stage III (moderate) (HCC) 05/29/2011   Abdominal pain 02/11/2011   Fibromyalgia 02/11/2011   Sebaceous cyst of breast, right subaereolar 10/30/2010   Past Medical History:  Diagnosis Date   Allergy    Anxiety    Arthritis    Asthma    Bruises easily    Chronic chest wall pain    Chronic pain    Cough    Depression    Diabetes mellitus without complication (HCC)    Dyspnea    chronic   Encounter for therapeutic drug monitoring 02/11/2011   Fibromyalgia    Functional movement disorder    GERD (gastroesophageal reflux disease)    Headache    Hearing loss    High cholesterol    History of hiatal hernia    Hypertension    Hypothyroidism    Kidney disease    stage 3   Leg swelling    both   Nausea 02/11/2011   Nausea and vomiting 04/01/2013   Pain management    PONV (postoperative nausea and vomiting)    SMAS (superior mesenteric artery syndrome) (HCC)    Thyroid disease    TIA (transient ischemic attack)    Tic disorder    TMJ (dislocation of temporomandibular joint)    Transient cerebral ischemia    Type 2 diabetes mellitus with complications (HCC)    Past Surgical History:  Procedure Laterality Date   ABDOMINAL HYSTERECTOMY     AORTA - SUPERIOR MESENTERIC AND AORTA - RENAL ARTERY BYPASS GRAFT     APPENDECTOMY     BREAST CYST EXCISION Right    BREAST EXCISIONAL BIOPSY     CHOLECYSTECTOMY     cyst removed     HERNIA REPAIR      OOPHORECTOMY     bilateral   TEE WITHOUT CARDIOVERSION N/A 06/18/2012   Procedure: TRANSESOPHAGEAL ECHOCARDIOGRAM (TEE);  Surgeon: Thurmon Fair, MD;  Location: [facility] ENDOSCOPY;  Service: Cardiovascular;  Laterality: N/A;   Family History  Problem Relation Age of Onset   Hypertension Sister    Asthma Father    Cancer Paternal Grandmother        breast   Heart disease Paternal Grandmother    Breast cancer Paternal Grandmother    Pulmonary fibrosis Maternal Grandmother    Emphysema Maternal Grandmother    Heart disease Maternal Grandmother    Breast cancer Maternal Grandmother    Leukemia Paternal Grandfather    Heart disease Paternal Grandfather    Clotting disorder Paternal Grandfather    Parkinson's disease Maternal Aunt  Asthma Maternal Grandfather    Heart disease Maternal Grandfather    Outpatient Medications Prior to Visit  Medication Sig Dispense Refill   albuterol (PROVENTIL HFA;VENTOLIN HFA) 108 (90 BASE) MCG/ACT inhaler Inhale 2 puffs into the lungs every 6 (six) hours as needed. For shortness of breath     diazepam (VALIUM) 5 MG tablet Take 1 tablet (5 mg total) by mouth 3 (three) times daily as needed for anxiety. 30 tablet 3   dicyclomine (BENTYL) 10 MG capsule Take 1 capsule (10 mg total) by mouth 4 (four) times daily -  before meals and at bedtime. 60 capsule 1   gabapentin (NEURONTIN) 600 MG tablet Take 1 tablet (600 mg total) by mouth at bedtime. 60 tablet 3   levothyroxine (SYNTHROID) 125 MCG tablet Take 1 tablet (125 mcg total) by mouth daily before breakfast. 60 tablet 3   omeprazole (PRILOSEC) 20 MG capsule Take 20 mg by mouth daily.     promethazine (PHENERGAN) 25 MG tablet Take 1 tablet (25 mg total) by mouth every 8 (eight) hours as needed for nausea or vomiting. 30 tablet 3   propranolol (INDERAL) 80 MG tablet Take 1 tablet (80 mg total) by mouth daily. 90 tablet 3   traZODone (DESYREL) 100 MG tablet Take 1 tablet (100 mg total) by mouth at bedtime. 30 tablet  3   buPROPion ER (WELLBUTRIN SR) 100 MG 12 hr tablet Take by mouth.     metFORMIN (GLUCOPHAGE-XR) 500 MG 24 hr tablet Take 500 mg by mouth daily with breakfast.     methocarbamol (ROBAXIN) 500 MG tablet Take 2 tablets (1,000 mg total) by mouth 4 (four) times daily as needed for muscle spasms (muscle spasm/pain). 25 tablet 0   No facility-administered medications prior to visit.   Allergies  Allergen Reactions   Latex Anaphylaxis and Swelling   Codeine Nausea And Vomiting    Other reaction(s): GI Upset (intolerance), Vomiting (intolerance)   Ibuprofen Other (See Comments)    GI upset, burning sensation   Oxycodone Nausea And Vomiting    Pre-medication with phenergan needed.   Tape Itching and Rash    Please use "paper" tape only.     ROS: A complete ROS was performed with pertinent positives/negatives noted in the HPI. The remainder of the ROS are negative.    Objective:   Today's Vitals   11/04/22 0950  BP: 128/85  Pulse: 67  SpO2: 99%  Weight: 151 lb 14.4 oz (68.9 kg)  Height: 5\' 4"  (1.626 m)    Physical Exam          GENERAL: Well-appearing, in NAD. Well nourished.  SKIN: Pink, warm and dry. No rash, lesion, ulceration, or ecchymoses.  Head: Normocephalic. NECK: Trachea midline. Full ROM w/o pain or tenderness.  RESPIRATORY: Chest wall symmetrical. Respirations even and non-labored. Breath sounds clear to auscultation bilaterally.  CARDIAC: S1, S2 present, regular rate and rhythm without murmur or gallops. Peripheral pulses 2+ bilaterally.  Bruit present to right carotid artery.  Mild bruit present to left carotid artery.  No thrill palpated. MSK: Muscle tone and strength appropriate for age.  EXTREMITIES: Without clubbing, cyanosis.  +2 pitting edema present to bilateral lower extremities NEUROLOGIC: No motor or sensory deficits. Steady, even gait. C2-C12 intact.  PSYCH/MENTAL STATUS: Alert, oriented x 3. Cooperative, appropriate mood and affect.   EKG: normal EKG,  normal sinus rhythm, unchanged from previous tracings.  The 10-year ASCVD risk score (Arnett DK, et al., 2019) is: 13%  Assessment & Plan:  1. Bilateral lower extremity edema 2. Chest pain, unspecified type EKG unremarkable in office today. Given patient's unremarkable vascular study in the past, will rule out possible CHF with a complete echocardiogram and lab work for BNP, BMP and magnesium given patient's recent dosing of Lasix.  I recommended patient start wearing bilateral compression hose daily, elevating her feet, monitoring salt intake, and take her Lasix 40 mg daily as needed.  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  Patient agreeable to this plan. - ECHOCARDIOGRAM COMPLETE; Future - Brain natriuretic peptide - Basic metabolic panel - Magnesium   3. Acquired hypothyroidism Will repeat TSH today with lab work and titrate patient's thyroid as appropriate.  Edema and palpitations may be stemming from uncontrolled hypothyroidism. - TSH  4. Bilateral carotid bruits Right bruit stronger than the left.  Will obtain vascular ultrasound carotids to rule out stenosis of carotid arteries.  If present, depending on degree will refer to vascular. - VAS US CAROTID; Future  5. Syncope Will obtain CT head per patient desire to rule out possible TIA versus subacute stroke.  Will also obtain a Zio patch to monitor patient's rhythm given syncopal episode.  Patient understanding to seek the ER if this occurs once again or if symptoms of chest pain, palpitations, dizziness, nausea or vomiting or confusion occur to go to the ER. - CT HEAD WO CONTRAST ( ); Future - LONG TERM MONITOR-LIVE TELEMETRY (3-14 DAYS); Future  6. Mixed Hyperlipidemia Discussed starting patient on statin therapy given hx of atherosclerosis, ASCVD risk of 13%, and concern for stenosis of carotids. Start Atorvastatin 20mg  nightly. Will repeat labs in  3 months.   7. Type 2 diabetes mellitus with complications (HCC) Controlled. Continue metformin and diet control.   8. CKD (chronic kidney disease) stage 2, GFR 60-89 ml/min Controlled. Discussed NSAID avoidance, hydration, and renal diet.    Meds ordered this encounter  Medications   buPROPion ER (WELLBUTRIN SR) 100 MG 12 hr tablet    Sig: Take 1 tablet (100 mg total) by mouth daily.    Dispense:  90 tablet    Refill:  3    Order Specific Question:   Supervising Provider    Answer:   DE [place], RAYMOND J [[id number]]   metFORMIN (GLUCOPHAGE-XR) 500 MG 24 hr tablet    Sig: Take 1 tablet (500 mg total) by mouth daily with breakfast.    Dispense:  90 tablet    Refill:  3    Order Specific Question:   Supervising Provider    Answer:   DE [place], RAYMOND J [[id number]]   Lab Orders         Brain natriuretic peptide         Basic metabolic panel         Magnesium         TSH     Return in about 4 weeks (around 12/02/2022) for Follow up BLE.    Patient to reach out to office if new, worrisome, or unresolved symptoms arise or if no improvement in patient's condition. Patient verbalized understanding and is agreeable to treatment plan. All questions answered to patient's satisfaction.   A total of 50 minutes were spent on this encounter today. When total time is documented, this includes both the face-to-face and non-face-to-face time personally spent before, during and after the visit on the date of the encounter.    Of note, portions of  this note may have been created with voice recognition software [employer], medical). While this note has been edited for accuracy, occasional wrong-word or 'sound-a-like' substitutions may have occurred due to the inherent limitations of voice recognition software.  Yolanda Manges, FNP

## 2022-11-04 NOTE — Patient Instructions (Signed)
Put on your conmpression socks daily.   Medi Compression Stockings - thigh high or calf   Monitor Salt intake and elevate your legs frequently.   You will be called to schedule your Heart Echo, Carotid Ultrasound, and your CT Head

## 2022-11-04 NOTE — Progress Notes (Unsigned)
Enrolled for [employer] to mail a ZIO AT Live Telemetry monitor to patients address on file.   DOD to read.

## 2022-11-05 LAB — BRAIN NATRIURETIC PEPTIDE: BNP: 8.6 pg/mL (ref 0.0–100.0)

## 2022-11-05 LAB — MAGNESIUM: Magnesium: 2.1 mg/dL (ref 1.6–2.3)

## 2022-11-05 LAB — BASIC METABOLIC PANEL
BUN/Creatinine Ratio: 10 (ref 9–23)
BUN: 10 mg/dL (ref 6–24)
CO2: 25 mmol/L (ref 20–29)
Calcium: 9.2 mg/dL (ref 8.7–10.2)
Chloride: 102 mmol/L (ref 96–106)
Creatinine, Ser: 1 mg/dL (ref 0.57–1.00)
Glucose: 90 mg/dL (ref 70–99)
Potassium: 4 mmol/L (ref 3.5–5.2)
Sodium: 143 mmol/L (ref 134–144)
eGFR: 69 mL/min/{1.73_m2} (ref >59)

## 2022-11-05 LAB — TSH: TSH: 0.015 u[IU]/mL — ABNORMAL LOW (ref 0.450–4.500)

## 2022-11-07 ENCOUNTER — Ambulatory Visit (HOSPITAL_COMMUNITY)
Admission: RE | Admit: 2022-11-07 | Discharge: 2022-11-07 | Disposition: A | Discharge status: Home / Self Care | Payer: Medicare Other | Source: Ambulatory Visit | Attending: Family Medicine | Admitting: Family Medicine

## 2022-11-07 DIAGNOSIS — R0989 Other specified symptoms and signs involving the circulatory and respiratory systems: Secondary | ICD-10-CM

## 2022-11-07 DIAGNOSIS — R55 Syncope and collapse: Secondary | ICD-10-CM | POA: Diagnosis not present

## 2022-11-15 ENCOUNTER — Other Ambulatory Visit (HOSPITAL_BASED_OUTPATIENT_CLINIC_OR_DEPARTMENT_OTHER): Payer: Self-pay | Admitting: Family Medicine

## 2022-11-15 DIAGNOSIS — E039 Hypothyroidism, unspecified: Secondary | ICD-10-CM

## 2022-11-15 MED ORDER — LEVOTHYROXINE SODIUM 112 MCG PO TABS
112.0000 ug | ORAL_TABLET | Freq: Every day | ORAL | 0 refills | Status: DC
Start: 2022-11-15 — End: 2022-12-20

## 2022-11-21 ENCOUNTER — Encounter (HOSPITAL_BASED_OUTPATIENT_CLINIC_OR_DEPARTMENT_OTHER): Payer: Self-pay | Admitting: Family Medicine

## 2022-11-21 ENCOUNTER — Telehealth (HOSPITAL_BASED_OUTPATIENT_CLINIC_OR_DEPARTMENT_OTHER): Payer: Self-pay | Admitting: Family Medicine

## 2022-11-21 ENCOUNTER — Telehealth (HOSPITAL_COMMUNITY): Payer: Self-pay | Admitting: Family Medicine

## 2022-11-21 DIAGNOSIS — R600 Localized edema: Secondary | ICD-10-CM

## 2022-11-21 DIAGNOSIS — R079 Chest pain, unspecified: Secondary | ICD-10-CM

## 2022-11-21 NOTE — Telephone Encounter (Signed)
We have attempted to contact the ordering providers office to obtain a prior authorization for the ordered test.  However, we have been unsuccesful. Order will be removed from the active order WQ. Once the prior authorization is obtained we will reinstate the order and schedule patient.     11/20/22 LVM ON OFFICE NUMBER # @ [id number]  11/14/22 Inbasket sent for PA# x 3  11/11/22 Inbasket sent for PA# x 2  11/04/22 Inbasket sent for PA#    Thank you

## 2022-11-21 NOTE — Telephone Encounter (Signed)
Received a VM from [place] with [facility] stating they have received the order for pt to have an echocardiogram. Misty Stanley states  prior to them being able to schedule the echo, pt's insurance will need to do prior auth and then once they have the auth, they will be able to get pt scheduled for the echo.   Carollee Herter, do you know what do we need to do about the authorization?

## 2022-11-21 NOTE — Telephone Encounter (Signed)
Called Dr. Ramon Dredge Hollander's office to see if pt had had an eye exam done this year. Stated that we referred pt back 9/12 and they then told me that they have not been accepting new patients for awhile now.   Tried to call pt about this but unable to reach. Left pt a detailed message and also asked for her to return our call so we can try to get more info from pt about eye exam.

## 2022-11-22 ENCOUNTER — Telehealth (HOSPITAL_BASED_OUTPATIENT_CLINIC_OR_DEPARTMENT_OTHER): Payer: Self-pay | Admitting: Family Medicine

## 2022-11-22 NOTE — Telephone Encounter (Signed)
Per [employer] [facility], patient is showing that she is due for her Medicare Annual Wellness Visit. Attempted to call pt to see if I could get this scheduled but unable to reach. Left detailed message for pt to return our call to let us know if we could schedule the visit or if she declined on scheduling. Will await a return call from pt.

## 2022-11-25 NOTE — Telephone Encounter (Signed)
Alexis, what CPT code would be needed for this?

## 2022-11-26 NOTE — Telephone Encounter (Signed)
Looked up CPT code for echocardiogram and found that the [place]PT code is 93306.  Called pt's insurance company to obtain prior authorization for the echocardiogram so that way it could then be scheduled. Due to long hold time, will call back later to obtain.

## 2022-11-26 NOTE — Telephone Encounter (Signed)
Called pt's insurance via the provider line at [phone] to see if a prior authorization was needed for pt to have echocardiogram.  Provided them with the CPT code and was told that a prior authorization was not needed.  Call reference number for the call: [id number].  Routing to [employer] so we can get the echo scheduled for pt.

## 2022-11-27 NOTE — Telephone Encounter (Signed)
Called provider line for pt's insurance again and provided them with all three CPT codes. No prior authorization is needed for any of the codes.  Reference number: [id number]   Order has been modified showing this. Routing back to Leslie.

## 2022-11-29 ENCOUNTER — Other Ambulatory Visit: Payer: Medicare Other

## 2022-12-02 ENCOUNTER — Ambulatory Visit (INDEPENDENT_AMBULATORY_CARE_PROVIDER_SITE_OTHER): Payer: Medicare Other | Admitting: Family Medicine

## 2022-12-02 VITALS — BP 120/70 | HR 57 | Ht 64.0 in | Wt 152.2 lb

## 2022-12-02 DIAGNOSIS — R29898 Other symptoms and signs involving the musculoskeletal system: Secondary | ICD-10-CM

## 2022-12-02 DIAGNOSIS — E039 Hypothyroidism, unspecified: Secondary | ICD-10-CM | POA: Diagnosis not present

## 2022-12-02 DIAGNOSIS — R600 Localized edema: Secondary | ICD-10-CM

## 2022-12-02 DIAGNOSIS — R55 Syncope and collapse: Secondary | ICD-10-CM

## 2022-12-02 DIAGNOSIS — L0100 Impetigo, unspecified: Secondary | ICD-10-CM

## 2022-12-02 DIAGNOSIS — R0989 Other specified symptoms and signs involving the circulatory and respiratory systems: Secondary | ICD-10-CM

## 2022-12-02 MED ORDER — MUPIROCIN CALCIUM 2 % EX CREA: 1.0000 | TOPICAL_CREAM | Freq: Two times a day (BID) | CUTANEOUS | 0 refills | Status: DC

## 2022-12-02 NOTE — Progress Notes (Unsigned)
Subjective:   Sabrina Roberson May 31, 1970 12/02/2022  Chief Complaint  Patient presents with   Medical Management of Chronic Issues    4-week follow up; patient is still experiencing swelling. States that she did get the stockings but has not worn them yet. Echo was finally scheduled. Has seen some bruises on the back of her calves.    HPI: Sabrina Roberson presents today for re-assessment and management of chronic medical conditions.  HYPOTHYROIDISM: Patient presents for the medical management of Hypothyroidism. Current medication: Levothyroxine (decreased on 10/07 from due to TSH)  Patient compliant with medication regimen: yes  Fatigue: no Cold intolerance: no Weight gain/loss: no Constipation: no Lower extremity edema: no Palpitations: yes Hoarseness: no Neck Pain/Compression: no Difficulty Swallowing: no  Lab Results  Component Value Date   TSH 0.015 (L) 11/04/2022    LEG SWELLING:  Patient reports ongoing bilateral lower extremity swelling with only mild relief with Lasix PRN. She is scheduled for Echo in November 2024 and has completed her Zio patch and Carotid artery duplex. BNP unremarkable from last visit. Per chart review, patient has had several studies of bilateral venous duplex lower extremities that have been unremarkable.  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  She states that she has noticed increased "weakness" in her lower extremities and would like to be followed by neurology for second opinion.  She states that she declines to perform her CT head as recommended by PCP for prior near syncopal episode and would like to see neurology first.  She denies any recent falls at this time.   SKIN CONCERN:  Patient reports new onset of "crusty lesions" around her mouth ongoing for the past 1 to  2 weeks.  She states the area has opened up and has clear drainage and will form a yellow crust over it.  Denies any itching, exposure to new fragrances, lotions, topical creams or irritants.  Denies history of herpes simplex virus outbreaks.  She has not tried any topical creams or medications for relief.   The following portions of the patient's history were reviewed and updated as appropriate: past medical history, past surgical history, family history, social history, allergies, medications, and problem list.   Patient Active Problem List   Diagnosis Date Noted   Bilateral lower extremity edema 12/03/2022   Bilateral carotid bruits 11/04/2022   CKD (chronic kidney disease) stage 2, GFR 60-89 ml/min 11/04/2022   Mixed hyperlipidemia 10/10/2022   GAD (generalized anxiety disorder) 10/10/2022   Acquired hypothyroidism 10/10/2022   Hypertension    Type 2 diabetes mellitus with complications (HCC)    Primary insomnia 09/18/2022   Dysuria 09/18/2022   Abnormal CT of the chest 02/04/2014   DOE (dyspnea on exertion) 12/21/2013   Benzodiazepine dependence, episodic (HCC) 12/23/2011   DDD (degenerative disc disease), cervical 12/23/2011   DDD (degenerative disc disease), lumbosacral 12/23/2011   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   Chronic kidney disease (CKD), stage III (moderate) (HCC) 05/29/2011   Abdominal pain 02/11/2011   Fibromyalgia 02/11/2011   Sebaceous cyst of breast, right subaereolar 10/30/2010   Past Medical History:  Diagnosis Date   Allergy    Anxiety    Arthritis    Asthma    Bruises easily    Chronic chest wall pain    Chronic  pain    Cough    Depression    Diabetes mellitus without complication (HCC)    Dyspnea    chronic   Encounter for therapeutic drug monitoring 02/11/2011   Fibromyalgia    Functional movement disorder    GERD (gastroesophageal reflux disease)    Headache    Hearing loss    High cholesterol    History of hiatal hernia     Hypertension    Hypothyroidism    Kidney disease    stage 3   Leg swelling    both   Nausea 02/11/2011   Nausea and vomiting 04/01/2013   Pain management    PONV (postoperative nausea and vomiting)    SMAS (superior mesenteric artery syndrome) (HCC)    Thyroid disease    TIA (transient ischemic attack)    Tic disorder    TMJ (dislocation of temporomandibular joint)    Transient cerebral ischemia    Type 2 diabetes mellitus with complications (HCC)    Past Surgical History:  Procedure Laterality Date   ABDOMINAL HYSTERECTOMY     AORTA - SUPERIOR MESENTERIC AND AORTA - RENAL ARTERY BYPASS GRAFT     APPENDECTOMY     BREAST CYST EXCISION Right    BREAST EXCISIONAL BIOPSY     CHOLECYSTECTOMY     cyst removed     HERNIA REPAIR     OOPHORECTOMY     bilateral   TEE WITHOUT CARDIOVERSION N/A 06/18/2012   Procedure: TRANSESOPHAGEAL ECHOCARDIOGRAM (TEE);  Surgeon: Thurmon Fair, MD;  Location: [facility] ENDOSCOPY;  Service: Cardiovascular;  Laterality: N/A;   Family History  Problem Relation Age of Onset   Hypertension Sister    Asthma Father    Cancer Paternal Grandmother        breast   Heart disease Paternal Grandmother    Breast cancer Paternal Grandmother    Pulmonary fibrosis Maternal Grandmother    Emphysema Maternal Grandmother    Heart disease Maternal Grandmother    Breast cancer Maternal Grandmother    Leukemia Paternal Grandfather    Heart disease Paternal Grandfather    Clotting disorder Paternal Grandfather    Parkinson's disease Maternal Aunt    Asthma Maternal Grandfather    Heart disease Maternal Grandfather    Outpatient Medications Prior to Visit  Medication Sig Dispense Refill   albuterol (PROVENTIL HFA;VENTOLIN HFA) 108 (90 BASE) MCG/ACT inhaler Inhale 2 puffs into the lungs every 6 (six) hours as needed. For shortness of breath     atorvastatin (LIPITOR) 20 MG tablet Take 1 tablet (20 mg total) by mouth at bedtime. 90 tablet 3   buPROPion ER (WELLBUTRIN SR)  100 MG 12 hr tablet Take 1 tablet (100 mg total) by mouth daily. 90 tablet 3   diazepam (VALIUM) 5 MG tablet Take 1 tablet (5 mg total) by mouth 3 (three) times daily as needed for anxiety. 30 tablet 3   dicyclomine (BENTYL) 10 MG capsule Take 1 capsule (10 mg total) by mouth 4 (four) times daily -  before meals and at bedtime. 60 capsule 1   gabapentin (NEURONTIN) 600 MG tablet Take 1 tablet (600 mg total) by mouth at bedtime. 60 tablet 3   levothyroxine (SYNTHROID) 112 MCG tablet Take 1 tablet (112 mcg total) by mouth daily before breakfast. 30 tablet 0   metFORMIN (GLUCOPHAGE-XR) 500 MG 24 hr tablet Take 1 tablet (500 mg total) by mouth daily with breakfast. 90 tablet 3   omeprazole (PRILOSEC) 20 MG capsule Take 20 mg  by mouth daily.     promethazine (PHENERGAN) 25 MG tablet Take 1 tablet (25 mg total) by mouth every 8 (eight) hours as needed for nausea or vomiting. 30 tablet 3   propranolol (INDERAL) 80 MG tablet Take 1 tablet (80 mg total) by mouth daily. 90 tablet 3   traZODone (DESYREL) 100 MG tablet Take 1 tablet (100 mg total) by mouth at bedtime. 30 tablet 3   No facility-administered medications prior to visit.   Allergies  Allergen Reactions   Latex Anaphylaxis and Swelling   Codeine Nausea And Vomiting    Other reaction(s): GI Upset (intolerance), Vomiting (intolerance)   Ibuprofen Other (See Comments)    GI upset, burning sensation   Oxycodone Nausea And Vomiting    Pre-medication with phenergan needed.   Tape Itching and Rash    Please use "paper" tape only.     ROS: A complete ROS was performed with pertinent positives/negatives noted in the HPI. The remainder of the ROS are negative.    Objective:   Today's Vitals   12/02/22 1337  BP: 120/70  Pulse: (!) 57  SpO2: 99%  Weight: 152 lb 3.2 oz (69 kg)  Height: 5\' 4"  (1.626 m)    Physical Exam          GENERAL: Well-appearing, in NAD. Well nourished.  SKIN: Pink, warm and dry.  Yellow crusted papular rash  present to circumoral area with clear drainage. Head: Normocephalic. NECK: Trachea midline. Full ROM w/o pain or tenderness.  RESPIRATORY: Chest wall symmetrical. Respirations even and non-labored. Breath sounds clear to auscultation bilaterally.  CARDIAC: S1, S2 present, regular rate and rhythm without murmur or gallops. Peripheral pulses 2+ bilaterally.  Bruit present to right carotid artery.  Mild bruit present to left carotid artery.  No thrill palpated. MSK: Muscle tone and strength appropriate for age.  EXTREMITIES: Without clubbing, cyanosis.  +2 pitting edema present to bilateral lower extremities NEUROLOGIC: No motor or sensory deficits. Steady, even gait. C2-C12 intact.  PSYCH/MENTAL STATUS: Alert, oriented x 3. Cooperative, appropriate mood and affect.     Health Maintenance Due  Topic Date Due   Medicare Annual Wellness (AWV)  Never done   OPHTHALMOLOGY EXAM  Never done   Zoster Vaccines- Shingrix (1 of 2) Never done   Cervical Cancer Screening (HPV/Pap Cotest)  01/03/2015   DEXA SCAN  02/08/2017       Assessment & Plan:  1. Acquired hypothyroidism Patient will return in approximately 1 to 2 weeks for repeat TSH and titration of her medication per result.  Currently asymptomatic. - TSH; Future  2. Bruit of right carotid artery 3. Bilateral lower extremity edema Referral placed to vascular surgery for evaluation of tortuous flow of bilateral carotid arteries per recent carotid duplex.  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  She has been started on statin therapy.  Recommend continuing Lasix as needed and start using her compression socks as directed in prior visits.  Patient is agreeable and will be contacted to make an appointment. - Ambulatory referral to Vascular Surgery  4. Syncope, unspecified syncope type 5. Weakness of both lower  extremities PCP did recommend CT head to rule out possible TIA versus CVA and/or atherosclerotic disease that could be contributing to her near syncopal episode and weakness.  Patient declined at this time and would like to obtain second opinion from neurology.  Referral placed.  Patient so may benefit from PT referral in the future. - Ambulatory referral to Neurology   6. Impetigo Start mupirocin ointment twice daily.  Patient to reach out to PCP if no improvement after 1 to 2 weeks.   Meds ordered this encounter  Medications   mupirocin cream (BACTROBAN) 2 %    Sig: Apply 1 Application topically 2 (two) times daily.    Dispense:  15 g    Refill:  0    Order Specific Question:   Supervising Provider    Answer:   DE Peru, RAYMOND J [[id number]]   Lab Orders         TSH      Return in about 3 months (around 03/04/2023) for Follow up Hyperlipidemia .    Patient to reach out to office if new, worrisome, or unresolved symptoms arise or if no improvement in patient's condition. Patient verbalized understanding and is agreeable to treatment plan. All questions answered to patient's satisfaction.    Yolanda Manges, FNP

## 2022-12-02 NOTE — Patient Instructions (Signed)
Obtain your thyroid lab bloodwork draw on 12/11/22.

## 2022-12-03 DIAGNOSIS — R600 Localized edema: Secondary | ICD-10-CM | POA: Insufficient documentation

## 2022-12-05 ENCOUNTER — Other Ambulatory Visit (HOSPITAL_BASED_OUTPATIENT_CLINIC_OR_DEPARTMENT_OTHER): Payer: Self-pay | Admitting: Family Medicine

## 2022-12-05 DIAGNOSIS — I471 Supraventricular tachycardia, unspecified: Secondary | ICD-10-CM

## 2022-12-05 NOTE — Progress Notes (Signed)
Hi Sabrina Roberson,  Your heart monitor results have been read. While you were wearing this, you had 29 episodes of a very fast heart rate called SVT or supraventricular tachycardia. This is likely the cause of your syncopal episode as your hear was beating very fast and can cause dizziness, feeling fatigued or the sensation of your heart racing. I would like you to complete the echocardiogram and I have also placed a referral to Cardiology here at [facility] to be seen as well. This can sometimes be managed by medication. If you begin to experience a fast heart rate that does not resolve, dizziness, or another episode of passing out, please call 911 or go to the ER. If you have questions, please let me know.

## 2022-12-10 ENCOUNTER — Telehealth (HOSPITAL_BASED_OUTPATIENT_CLINIC_OR_DEPARTMENT_OTHER): Payer: Self-pay | Admitting: Family Medicine

## 2022-12-10 NOTE — Telephone Encounter (Signed)
error 

## 2022-12-11 ENCOUNTER — Ambulatory Visit (HOSPITAL_BASED_OUTPATIENT_CLINIC_OR_DEPARTMENT_OTHER): Payer: Medicare Other | Admitting: Family Medicine

## 2022-12-19 ENCOUNTER — Other Ambulatory Visit (HOSPITAL_BASED_OUTPATIENT_CLINIC_OR_DEPARTMENT_OTHER): Payer: Medicare Other

## 2022-12-19 ENCOUNTER — Encounter (HOSPITAL_BASED_OUTPATIENT_CLINIC_OR_DEPARTMENT_OTHER): Payer: Self-pay | Admitting: Family Medicine

## 2022-12-19 ENCOUNTER — Other Ambulatory Visit (HOSPITAL_BASED_OUTPATIENT_CLINIC_OR_DEPARTMENT_OTHER): Payer: Self-pay | Admitting: Family Medicine

## 2022-12-20 ENCOUNTER — Other Ambulatory Visit (HOSPITAL_BASED_OUTPATIENT_CLINIC_OR_DEPARTMENT_OTHER): Payer: Self-pay | Admitting: Family Medicine

## 2022-12-20 DIAGNOSIS — E039 Hypothyroidism, unspecified: Secondary | ICD-10-CM

## 2022-12-20 LAB — TSH: TSH: 0.018 u[IU]/mL — ABNORMAL LOW (ref 0.450–4.500)

## 2022-12-20 MED ORDER — LEVOTHYROXINE SODIUM 100 MCG PO TABS: 100.0000 ug | ORAL_TABLET | Freq: Every day | ORAL | 0 refills | Status: DC

## 2022-12-20 NOTE — Progress Notes (Signed)
Hi Sabrina Roberson,  Your TSH is still low and we need to decrease your Levothyroxine further to daily. We will then recheck with a lab visit in 6 weeks. I have sent in your new dosage to your pharmacy in [place]. If you have further questions, please let me know.   *Please schedule pt for repeat lab in 6 weeks.

## 2022-12-23 ENCOUNTER — Encounter (HOSPITAL_BASED_OUTPATIENT_CLINIC_OR_DEPARTMENT_OTHER): Payer: Self-pay | Admitting: *Deleted

## 2022-12-23 NOTE — Progress Notes (Deleted)
[facility] [place]  ASSESSMENT / PLAN: Sabrina Roberson is a 52 y.o. female with ***symptomatic {DESC; RIGHT/LEFT/BILATERAL:23152} {Vasm carotid art exam %:30973} carotid artery stenosis.   The patient's carotid artery stenosis merits consideration of revascularization to reduce the risk of future stroke because of {carotidindications:24836}. I quoted the patient risk reduction from intervention of {carotidpercentages:24837}.  *** The patient is a good candidate for carotid endarterectomy because of expected longevity (age <75 years, >5 years anticipated survival), physiologic fitness (able to perform > 4 METs), and anatomic suitability of the lesion to open surgery.  *** The patient is a good candidate for transcarotid artery revascularization (TCAR) because of {TCARindications:24838}. The patient has no exclusion factors to TCAR including cardiac emboli, atrial fibrillation or MI < 72 hours; recently implanted heart valve; major surgery <30 days; evolving stroke; spontaneous ICH < 12 months; recent stroke < 7 days; TIA or amaurosis fugax < 48 hours; severe dementia; bleeding disorders; life expectancy < 12 months; previous stent in target vessel; CCA/ICA occlusion or string sign; ipsilateral intracranial or extracranial stenosis; severe ostial lesion; previous intervention to proximal CCA; CCA disease at access site; < 5cm from clavicle to carotid bifurcation; CCA < 6mm in diameter; contralateral lateral recurrent, laryngeal, or vagus injury.  *** I do not think the patient is a candidate for any type of intervention for carotid artery stenosis because of ***.   Recommend:  Abstinence from all tobacco products. Blood glucose control with goal A1c < 7%. Blood pressure control with goal blood pressure < 140/90 mmHg. Lipid reduction therapy with goal LDL-C <100 mg/dL (***70mg /dL if symptomatic carotid stenosis).  Aspirin 81mg  PO QD.  *** Clopidogrel 75mg  PO  QD. Atorvastatin 40-80mg  PO QD (or other "high intensity" statin therapy).   CHIEF COMPLAINT: ***  HISTORY OF PRESENT ILLNESS: Sabrina Roberson is a 52 y.o. female ***  VASCULAR SURGICAL HISTORY: ***  VASCULAR RISK FACTORS: {FINDINGS; POSITIVE [phone]} history of stroke / transient ischemic attack. {FINDINGS; POSITIVE [phone]} history of coronary artery disease. *** history of PCI. *** history of CABG.  {FINDINGS; POSITIVE [phone]} history of diabetes mellitus. Last A1c ***. {FINDINGS; POSITIVE [phone]} history of smoking. *** actively smoking. {FINDINGS; POSITIVE [phone]} history of hypertension. *** drug regimen with *** control. {FINDINGS; POSITIVE [phone]} history of chronic kidney disease.  Last GFR ***. CKD {stage:30421363}. {FINDINGS; POSITIVE [phone]} history of chronic obstructive pulmonary disease, treated with ***.  FUNCTIONAL STATUS: ECOG performance status: {findings; ecog performance status:31780} Ambulatory status: {TNHAmbulation:25868}  CAREY 1 AND 3 YEAR INDEX Female (2pts) 75-79 or 80-84 (2pts) >84 (3pts) Dependence in toileting (1pt) Partial or full dependence in dressing (1pt) History of malignant neoplasm (2pts) CHF (3pts) COPD (1pts) CKD (3pts)  0-3 pts 6% 1 year mortality ; 21% 3 year mortality 4-5 pts 12% 1 year mortality ; 36% 3 year mortality >5 pts 21% 1 year mortality; 54% 3 year mortality   Past Medical History:  Diagnosis Date   Allergy    Anxiety    Arthritis    Asthma    Bruises easily    Chronic chest wall pain    Chronic pain    Cough    Depression    Diabetes mellitus without complication (HCC)    Dyspnea    chronic   Encounter for therapeutic drug monitoring 02/11/2011   Fibromyalgia    Functional movement disorder    GERD (gastroesophageal reflux disease)    Headache    Hearing loss    High cholesterol  History of hiatal hernia     Hypertension    Hypothyroidism    Kidney disease    stage 3   Leg swelling    both   Nausea 02/11/2011   Nausea and vomiting 04/01/2013   Pain management    PONV (postoperative nausea and vomiting)    SMAS (superior mesenteric artery syndrome) (HCC)    Thyroid disease    TIA (transient ischemic attack)    Tic disorder    TMJ (dislocation of temporomandibular joint)    Transient cerebral ischemia    Type 2 diabetes mellitus with complications (HCC)     Past Surgical History:  Procedure Laterality Date   ABDOMINAL HYSTERECTOMY     AORTA - SUPERIOR MESENTERIC AND AORTA - RENAL ARTERY BYPASS GRAFT     APPENDECTOMY     BREAST CYST EXCISION Right    BREAST EXCISIONAL BIOPSY     CHOLECYSTECTOMY     cyst removed     HERNIA REPAIR     OOPHORECTOMY     bilateral   TEE WITHOUT CARDIOVERSION N/A 06/18/2012   Procedure: TRANSESOPHAGEAL ECHOCARDIOGRAM (TEE);  Surgeon: Thurmon Fair, MD;  Location: [facility] ENDOSCOPY;  Service: Cardiovascular;  Laterality: N/A;    Family History  Problem Relation Age of Onset   Hypertension Sister    Asthma Father    Cancer Paternal Grandmother        breast   Heart disease Paternal Grandmother    Breast cancer Paternal Grandmother    Pulmonary fibrosis Maternal Grandmother    Emphysema Maternal Grandmother    Heart disease Maternal Grandmother    Breast cancer Maternal Grandmother    Leukemia Paternal Grandfather    Heart disease Paternal Grandfather    Clotting disorder Paternal Grandfather    Parkinson's disease Maternal Aunt    Asthma Maternal Grandfather    Heart disease Maternal Grandfather     Social History   Socioeconomic History   Marital status: Married    Spouse name: Not on file   Number of children: 2   Years of education: Not on file   Highest education level: 12th grade  Occupational History   Occupation: disabled  Tobacco Use   Smoking status: Every Day    Current packs/day: 0.50    Average packs/day: 0.5 packs/day for  15.0 years (7.5 ttl pk-yrs)    Types: Cigarettes   Smokeless tobacco: Never  Vaping Use   Vaping status: Former  Substance and Sexual Activity   Alcohol use: No    Alcohol/week: 0.0 standard drinks of alcohol   Drug use: No   Sexual activity: Never    Partners: Male  Other Topics Concern   Not on file  Social History Narrative   Not on file   Social Determinants of Health   Financial Resource Strain: Low Risk  (12/02/2022)   Overall Financial Resource Strain (CARDIA)    Difficulty of Paying Living Expenses: Not hard at all  Food Insecurity: No Food Insecurity (12/02/2022)   Hunger Vital Sign    Worried About Running Out of Food in the Last Year: Never true    Ran Out of Food in the Last Year: Never true  Transportation Needs: No Transportation Needs (12/02/2022)   PRAPARE - Administrator, Civil Service (Medical): No    Lack of Transportation (Non-Medical): No  Physical Activity: Unknown (12/02/2022)   Exercise Vital Sign    Days of Exercise per Week: Patient declined    Minutes of Exercise per  Session: 20 min  Recent Concern: Physical Activity - Insufficiently Active (10/10/2022)   Exercise Vital Sign    Days of Exercise per Week: 2 days    Minutes of Exercise per Session: 20 min  Stress: Stress Concern Present (12/02/2022)   [employer] of Occupational Health - Occupational Stress Questionnaire    Feeling of Stress : Very much  Social Connections: Unknown (12/02/2022)   Social Connection and Isolation Panel [NHANES]    Frequency of Communication with Friends and Family: Patient declined    Frequency of Social Gatherings with Friends and Family: Patient declined    Attends Religious Services: Patient declined    Database administrator or Organizations: No    Attends Engineer, structural: Never    Marital Status: Married  Recent Concern: Social Connections - Moderately Isolated (10/10/2022)   Social Connection and Isolation Panel [NHANES]     Frequency of Communication with Friends and Family: More than three times a week    Frequency of Social Gatherings with Friends and Family: Never    Attends Religious Services: Never    Database administrator or Organizations: No    Attends Banker Meetings: Never    Marital Status: Married  Catering manager Violence: Not At Risk (11/04/2022)   Humiliation, Afraid, Rape, and Kick questionnaire    Fear of Current or Ex-Partner: No    Emotionally Abused: No    Physically Abused: No    Sexually Abused: No    Allergies  Allergen Reactions   Latex Anaphylaxis and Swelling   Codeine Nausea And Vomiting    Other reaction(s): GI Upset (intolerance), Vomiting (intolerance)   Ibuprofen Other (See Comments)    GI upset, burning sensation   Oxycodone Nausea And Vomiting    Pre-medication with phenergan needed.   Tape Itching and Rash    Please use "paper" tape only.    Current Outpatient Medications  Medication Sig Dispense Refill   albuterol (PROVENTIL HFA;VENTOLIN HFA) 108 (90 BASE) MCG/ACT inhaler Inhale 2 puffs into the lungs every 6 (six) hours as needed. For shortness of breath     atorvastatin (LIPITOR) 20 MG tablet Take 1 tablet (20 mg total) by mouth at bedtime. 90 tablet 3   buPROPion ER (WELLBUTRIN SR) 100 MG 12 hr tablet Take 1 tablet (100 mg total) by mouth daily. 90 tablet 3   diazepam (VALIUM) 5 MG tablet Take 1 tablet (5 mg total) by mouth 3 (three) times daily as needed for anxiety. 30 tablet 3   dicyclomine (BENTYL) 10 MG capsule Take 1 capsule (10 mg total) by mouth 4 (four) times daily -  before meals and at bedtime. 60 capsule 1   gabapentin (NEURONTIN) 600 MG tablet Take 1 tablet (600 mg total) by mouth at bedtime. 60 tablet 3   levothyroxine (SYNTHROID) 100 MCG tablet Take 1 tablet (100 mcg total) by mouth daily before breakfast. 30 tablet 0   metFORMIN (GLUCOPHAGE-XR) 500 MG 24 hr tablet Take 1 tablet (500 mg total) by mouth daily with breakfast. 90 tablet  3   mupirocin cream (BACTROBAN) 2 % Apply 1 Application topically 2 (two) times daily. 15 g 0   omeprazole (PRILOSEC) 20 MG capsule Take 20 mg by mouth daily.     promethazine (PHENERGAN) 25 MG tablet Take 1 tablet (25 mg total) by mouth every 8 (eight) hours as needed for nausea or vomiting. 30 tablet 3   propranolol (INDERAL) 80 MG tablet Take 1 tablet (80 mg  total) by mouth daily. 90 tablet 3   traZODone (DESYREL) 100 MG tablet Take 1 tablet (100 mg total) by mouth at bedtime. 30 tablet 3   No current facility-administered medications for this visit.    PHYSICAL EXAM There were no vitals filed for this visit.  Constitutional: *** appearing. *** distress. Appears *** nourished.  Neurologic: CN ***. *** focal findings. *** sensory loss. Psychiatric: *** Mood and affect symmetric and appropriate. Eyes: *** No icterus. No conjunctival pallor. Ears, nose, throat: *** mucous membranes moist. Midline trachea.  Cardiac: *** rate and rhythm.  Respiratory: *** unlabored. Abdominal: *** soft, non-tender, non-distended.  Peripheral vascular: *** Extremity: *** edema. *** cyanosis. *** pallor.  Skin: *** gangrene. *** ulceration.  Lymphatic: *** Stemmer's sign. *** palpable lymphadenopathy.    PERTINENT LABORATORY AND RADIOLOGIC DATA  Most recent CBC    Latest Ref Rng & Units 09/18/2022    1:06 PM 12/18/2016    2:44 PM 10/09/2014   11:59 AM  CBC  WBC 3.4 - 10.8 x10E3/uL 7.5  7.4  5.5   Hemoglobin 11.1 - 15.9 g/dL 54.0  98.1  19.1   Hematocrit 34.0 - 46.6 % 38.2  42.6  44.5   Platelets 150 - 450 x10E3/uL 382  327  255      Most recent CMP    Latest Ref Rng & Units 11/04/2022   10:56 AM 09/18/2022   12:12 PM 12/18/2016    2:44 PM  CMP  Glucose 70 - 99 mg/dL 90  77  89   BUN 6 - 24 mg/dL 10  4  18    Creatinine 0.57 - 1.00 mg/dL 4.78  2.95  6.21   Sodium 134 - 144 mmol/L 143  141  140   Potassium 3.5 - 5.2 mmol/L 4.0  4.0  3.9   Chloride 96 - 106 mmol/L 102  107  105   CO2 20 -  29 mmol/L 25  26  26    Calcium 8.7 - 10.2 mg/dL 9.2  9.0  9.3   Total Protein 6.0 - 8.5 g/dL  5.9    Total Bilirubin 0.0 - 1.2 mg/dL  <3.0    Alkaline Phos 44 - 121 IU/L  110    AST 0 - 40 IU/L  17    ALT 0 - 32 IU/L  12      Renal function CrCl cannot be calculated (Patient's most recent lab result is older than the maximum 21 days allowed.).  Hgb A1c MFr Bld (%)  Date Value  09/18/2022 4.9    LDL Chol Calc (NIH)  Date Value Ref Range Status  09/18/2022 136 (H) 0 - 99 mg/dL Final     Vascular Imaging: ***  Mossie Gilder N. Lenell Antu, MD [facility] Phone Number: [phone] 12/23/2022 12:20 PM   Total time spent on preparing this encounter including chart review, data review, collecting history, examining the patient, coordinating care for this {tnhtimebilling:26202}  Portions of this report may have been transcribed using voice recognition software.  Every effort has been made to ensure accuracy; however, inadvertent computerized transcription errors may still be present.

## 2022-12-24 ENCOUNTER — Encounter: Payer: Medicare Other | Admitting: Vascular Surgery

## 2022-12-24 ENCOUNTER — Ambulatory Visit (HOSPITAL_COMMUNITY): Payer: Medicare Other | Attending: Family Medicine

## 2023-01-15 ENCOUNTER — Other Ambulatory Visit (HOSPITAL_BASED_OUTPATIENT_CLINIC_OR_DEPARTMENT_OTHER): Payer: Self-pay | Admitting: Family Medicine

## 2023-01-15 DIAGNOSIS — E039 Hypothyroidism, unspecified: Secondary | ICD-10-CM

## 2023-01-16 ENCOUNTER — Ambulatory Visit (INDEPENDENT_AMBULATORY_CARE_PROVIDER_SITE_OTHER): Payer: Medicare Other

## 2023-01-16 DIAGNOSIS — R079 Chest pain, unspecified: Secondary | ICD-10-CM

## 2023-01-16 DIAGNOSIS — R600 Localized edema: Secondary | ICD-10-CM

## 2023-01-16 LAB — ECHOCARDIOGRAM COMPLETE
Area-P 1/2: 3.27 cm2
S' Lateral: 3.28 cm

## 2023-01-17 NOTE — Progress Notes (Signed)
Briellah, Your echocardiogram salted and it does not show a significant change in your heart ability to function compared to your echo in 2014 that I can see on her chart.  There is a mild diastolic dysfunction or impaired relaxation that could be due to some age-related changes to the ventricle.  There was not significant strain placed on the heart, there is no regurgitation or narrowing of the valves.  Your aorta was also normal in size and structure.  Due to you having ongoing symptoms, I recommend continuing with your scheduled cardiology appointment.

## 2023-01-19 ENCOUNTER — Encounter (HOSPITAL_BASED_OUTPATIENT_CLINIC_OR_DEPARTMENT_OTHER): Payer: Self-pay | Admitting: Emergency Medicine

## 2023-01-19 ENCOUNTER — Emergency Department (HOSPITAL_BASED_OUTPATIENT_CLINIC_OR_DEPARTMENT_OTHER): Payer: Medicare Other

## 2023-01-19 ENCOUNTER — Inpatient Hospital Stay (HOSPITAL_COMMUNITY): Payer: Medicare Other

## 2023-01-19 ENCOUNTER — Other Ambulatory Visit: Payer: Self-pay

## 2023-01-19 ENCOUNTER — Inpatient Hospital Stay (HOSPITAL_BASED_OUTPATIENT_CLINIC_OR_DEPARTMENT_OTHER)
Admission: EM | Admit: 2023-01-19 | Discharge: 2023-02-05 | DRG: 870 | Disposition: A | Discharge status: Home / Self Care | Payer: Medicare Other | Attending: Family Medicine | Admitting: Family Medicine

## 2023-01-19 DIAGNOSIS — N179 Acute kidney failure, unspecified: Secondary | ICD-10-CM | POA: Diagnosis present

## 2023-01-19 DIAGNOSIS — Y848 Other medical procedures as the cause of abnormal reaction of the patient, or of later complication, without mention of misadventure at the time of the procedure: Secondary | ICD-10-CM | POA: Diagnosis not present

## 2023-01-19 DIAGNOSIS — J9601 Acute respiratory failure with hypoxia: Secondary | ICD-10-CM

## 2023-01-19 DIAGNOSIS — Z820 Family history of epilepsy and other diseases of the nervous system: Secondary | ICD-10-CM

## 2023-01-19 DIAGNOSIS — F959 Tic disorder, unspecified: Secondary | ICD-10-CM | POA: Diagnosis present

## 2023-01-19 DIAGNOSIS — D6959 Other secondary thrombocytopenia: Secondary | ICD-10-CM | POA: Diagnosis present

## 2023-01-19 DIAGNOSIS — Z91048 Other nonmedicinal substance allergy status: Secondary | ICD-10-CM

## 2023-01-19 DIAGNOSIS — Z888 Allergy status to other drugs, medicaments and biological substances status: Secondary | ICD-10-CM

## 2023-01-19 DIAGNOSIS — Z79890 Hormone replacement therapy (postmenopausal): Secondary | ICD-10-CM

## 2023-01-19 DIAGNOSIS — T4275XA Adverse effect of unspecified antiepileptic and sedative-hypnotic drugs, initial encounter: Secondary | ICD-10-CM | POA: Diagnosis not present

## 2023-01-19 DIAGNOSIS — J45909 Unspecified asthma, uncomplicated: Secondary | ICD-10-CM | POA: Diagnosis present

## 2023-01-19 DIAGNOSIS — Z8673 Personal history of transient ischemic attack (TIA), and cerebral infarction without residual deficits: Secondary | ICD-10-CM

## 2023-01-19 DIAGNOSIS — E039 Hypothyroidism, unspecified: Secondary | ICD-10-CM | POA: Diagnosis present

## 2023-01-19 DIAGNOSIS — N189 Chronic kidney disease, unspecified: Secondary | ICD-10-CM

## 2023-01-19 DIAGNOSIS — Z1152 Encounter for screening for COVID-19: Secondary | ICD-10-CM

## 2023-01-19 DIAGNOSIS — E1122 Type 2 diabetes mellitus with diabetic chronic kidney disease: Secondary | ICD-10-CM | POA: Diagnosis present

## 2023-01-19 DIAGNOSIS — A4189 Other specified sepsis: Secondary | ICD-10-CM | POA: Diagnosis present

## 2023-01-19 DIAGNOSIS — K219 Gastro-esophageal reflux disease without esophagitis: Secondary | ICD-10-CM | POA: Diagnosis present

## 2023-01-19 DIAGNOSIS — J80 Acute respiratory distress syndrome: Principal | ICD-10-CM

## 2023-01-19 DIAGNOSIS — J129 Viral pneumonia, unspecified: Secondary | ICD-10-CM | POA: Diagnosis present

## 2023-01-19 DIAGNOSIS — R578 Other shock: Secondary | ICD-10-CM | POA: Diagnosis not present

## 2023-01-19 DIAGNOSIS — F32A Depression, unspecified: Secondary | ICD-10-CM | POA: Diagnosis present

## 2023-01-19 DIAGNOSIS — F17210 Nicotine dependence, cigarettes, uncomplicated: Secondary | ICD-10-CM | POA: Diagnosis present

## 2023-01-19 DIAGNOSIS — Z79899 Other long term (current) drug therapy: Secondary | ICD-10-CM

## 2023-01-19 DIAGNOSIS — G9341 Metabolic encephalopathy: Secondary | ICD-10-CM | POA: Diagnosis not present

## 2023-01-19 DIAGNOSIS — I129 Hypertensive chronic kidney disease with stage 1 through stage 4 chronic kidney disease, or unspecified chronic kidney disease: Secondary | ICD-10-CM | POA: Diagnosis present

## 2023-01-19 DIAGNOSIS — J15212 Pneumonia due to Methicillin resistant Staphylococcus aureus: Secondary | ICD-10-CM | POA: Diagnosis present

## 2023-01-19 DIAGNOSIS — E871 Hypo-osmolality and hyponatremia: Secondary | ICD-10-CM | POA: Diagnosis not present

## 2023-01-19 DIAGNOSIS — Z832 Family history of diseases of the blood and blood-forming organs and certain disorders involving the immune mechanism: Secondary | ICD-10-CM

## 2023-01-19 DIAGNOSIS — E7800 Pure hypercholesterolemia, unspecified: Secondary | ICD-10-CM | POA: Diagnosis present

## 2023-01-19 DIAGNOSIS — J1008 Influenza due to other identified influenza virus with other specified pneumonia: Secondary | ICD-10-CM | POA: Diagnosis present

## 2023-01-19 DIAGNOSIS — E874 Mixed disorder of acid-base balance: Secondary | ICD-10-CM | POA: Diagnosis present

## 2023-01-19 DIAGNOSIS — R6521 Severe sepsis with septic shock: Secondary | ICD-10-CM | POA: Diagnosis present

## 2023-01-19 DIAGNOSIS — E876 Hypokalemia: Secondary | ICD-10-CM | POA: Diagnosis present

## 2023-01-19 DIAGNOSIS — D631 Anemia in chronic kidney disease: Secondary | ICD-10-CM | POA: Diagnosis present

## 2023-01-19 DIAGNOSIS — N181 Chronic kidney disease, stage 1: Secondary | ICD-10-CM | POA: Diagnosis present

## 2023-01-19 DIAGNOSIS — I952 Hypotension due to drugs: Secondary | ICD-10-CM | POA: Diagnosis not present

## 2023-01-19 DIAGNOSIS — Z91040 Latex allergy status: Secondary | ICD-10-CM

## 2023-01-19 DIAGNOSIS — F419 Anxiety disorder, unspecified: Secondary | ICD-10-CM | POA: Diagnosis present

## 2023-01-19 DIAGNOSIS — M797 Fibromyalgia: Secondary | ICD-10-CM | POA: Diagnosis present

## 2023-01-19 DIAGNOSIS — Z8709 Personal history of other diseases of the respiratory system: Secondary | ICD-10-CM

## 2023-01-19 DIAGNOSIS — A419 Sepsis, unspecified organism: Secondary | ICD-10-CM

## 2023-01-19 DIAGNOSIS — J111 Influenza due to unidentified influenza virus with other respiratory manifestations: Secondary | ICD-10-CM | POA: Diagnosis not present

## 2023-01-19 DIAGNOSIS — D61818 Other pancytopenia: Secondary | ICD-10-CM | POA: Diagnosis not present

## 2023-01-19 DIAGNOSIS — Z885 Allergy status to narcotic agent status: Secondary | ICD-10-CM

## 2023-01-19 DIAGNOSIS — J1001 Influenza due to other identified influenza virus with the same other identified influenza virus pneumonia: Secondary | ICD-10-CM | POA: Diagnosis present

## 2023-01-19 DIAGNOSIS — J189 Pneumonia, unspecified organism: Secondary | ICD-10-CM | POA: Diagnosis not present

## 2023-01-19 DIAGNOSIS — Z825 Family history of asthma and other chronic lower respiratory diseases: Secondary | ICD-10-CM

## 2023-01-19 DIAGNOSIS — Z8249 Family history of ischemic heart disease and other diseases of the circulatory system: Secondary | ICD-10-CM

## 2023-01-19 DIAGNOSIS — E860 Dehydration: Secondary | ICD-10-CM | POA: Diagnosis present

## 2023-01-19 DIAGNOSIS — Z7984 Long term (current) use of oral hypoglycemic drugs: Secondary | ICD-10-CM

## 2023-01-19 HISTORY — DX: Sepsis, unspecified organism: A41.9

## 2023-01-19 HISTORY — DX: Severe sepsis with septic shock: R65.21

## 2023-01-19 LAB — COMPREHENSIVE METABOLIC PANEL
ALT: 14 U/L (ref 0–44)
AST: 34 U/L (ref 15–41)
Albumin: 3.4 g/dL — ABNORMAL LOW (ref 3.5–5.0)
Alkaline Phosphatase: 76 U/L (ref 38–126)
Anion gap: 13 (ref 5–15)
BUN: 19 mg/dL (ref 6–20)
CO2: 23 mmol/L (ref 22–32)
Calcium: 8 mg/dL — ABNORMAL LOW (ref 8.9–10.3)
Chloride: 105 mmol/L (ref 98–111)
Creatinine, Ser: 1.11 mg/dL — ABNORMAL HIGH (ref 0.44–1.00)
GFR, Estimated: >60 mL/min (ref >60)
Glucose, Bld: 97 mg/dL (ref 70–99)
Potassium: 3.1 mmol/L — ABNORMAL LOW (ref 3.5–5.1)
Sodium: 141 mmol/L (ref 135–145)
Total Bilirubin: 0.4 mg/dL (ref <1.2)
Total Protein: 5.7 g/dL — ABNORMAL LOW (ref 6.5–8.1)

## 2023-01-19 LAB — BLOOD GAS, ARTERIAL
Acid-base deficit: 8.4 mmol/L — ABNORMAL HIGH (ref 0.0–2.0)
Bicarbonate: 19.6 mmol/L — ABNORMAL LOW (ref 20.0–28.0)
Drawn by: 11249
FIO2: 80 %
MECHVT: 430 mL
O2 Content: 80 L/min
O2 Saturation: 97.6 %
PEEP: 10 cmH2O
Patient temperature: 37.1
RATE: 24 {breaths}/min
pCO2 arterial: 49 mm[Hg] — ABNORMAL HIGH (ref 32–48)
pH, Arterial: 7.21 — ABNORMAL LOW (ref 7.35–7.45)
pO2, Arterial: 92 mm[Hg] (ref 83–108)

## 2023-01-19 LAB — CBC WITH DIFFERENTIAL/PLATELET
Abs Immature Granulocytes: 0 10*3/uL (ref 0.00–0.07)
Basophils Absolute: 0 10*3/uL (ref 0.0–0.1)
Basophils Relative: 0 %
Eosinophils Absolute: 0 10*3/uL (ref 0.0–0.5)
Eosinophils Relative: 0 %
HCT: 41.2 % (ref 36.0–46.0)
Hemoglobin: 13.5 g/dL (ref 12.0–15.0)
Immature Granulocytes: 0 %
Lymphocytes Relative: 17 %
Lymphs Abs: 0.2 10*3/uL — ABNORMAL LOW (ref 0.7–4.0)
MCH: 29.3 pg (ref 26.0–34.0)
MCHC: 32.8 g/dL (ref 30.0–36.0)
MCV: 89.4 fL (ref 80.0–100.0)
Monocytes Absolute: 0 10*3/uL — ABNORMAL LOW (ref 0.1–1.0)
Monocytes Relative: 1 %
Neutro Abs: 0.8 10*3/uL — ABNORMAL LOW (ref 1.7–7.7)
Neutrophils Relative %: 82 %
Platelets: 123 10*3/uL — ABNORMAL LOW (ref 150–400)
RBC: 4.61 MIL/uL (ref 3.87–5.11)
RDW: 15.3 % (ref 11.5–15.5)
WBC: 1 10*3/uL — CL (ref 4.0–10.5)
nRBC: 2.1 % — ABNORMAL HIGH (ref 0.0–0.2)

## 2023-01-19 LAB — I-STAT ARTERIAL BLOOD GAS, ED
Acid-base deficit: 8 mmol/L — ABNORMAL HIGH (ref 0.0–2.0)
Acid-base deficit: 10 mmol/L — ABNORMAL HIGH (ref 0.0–2.0)
Bicarbonate: 20.9 mmol/L (ref 20.0–28.0)
Bicarbonate: 20.2 mmol/L (ref 20.0–28.0)
Calcium, Ion: 1.08 mmol/L — ABNORMAL LOW (ref 1.15–1.40)
Calcium, Ion: 1.02 mmol/L — ABNORMAL LOW (ref 1.15–1.40)
HCT: 33 % — ABNORMAL LOW (ref 36.0–46.0)
HCT: 28 % — ABNORMAL LOW (ref 36.0–46.0)
Hemoglobin: 11.2 g/dL — ABNORMAL LOW (ref 12.0–15.0)
Hemoglobin: 9.5 g/dL — ABNORMAL LOW (ref 12.0–15.0)
O2 Saturation: 96 %
O2 Saturation: 100 %
Patient temperature: 97.8
Patient temperature: 97.8
Potassium: 2.6 mmol/L — CL (ref 3.5–5.1)
Potassium: 2.7 mmol/L — CL (ref 3.5–5.1)
Sodium: 141 mmol/L (ref 135–145)
Sodium: 144 mmol/L (ref 135–145)
TCO2: 23 mmol/L (ref 22–32)
TCO2: 22 mmol/L (ref 22–32)
pCO2 arterial: 59.7 mm[Hg] — ABNORMAL HIGH (ref 32–48)
pCO2 arterial: 64.2 mm[Hg] — ABNORMAL HIGH (ref 32–48)
pH, Arterial: 7.15 — CL (ref 7.35–7.45)
pH, Arterial: 7.102 — CL (ref 7.35–7.45)
pO2, Arterial: 103 mm[Hg] (ref 83–108)
pO2, Arterial: 229 mm[Hg] — ABNORMAL HIGH (ref 83–108)

## 2023-01-19 LAB — GLUCOSE, CAPILLARY
Glucose-Capillary: 102 mg/dL — ABNORMAL HIGH (ref 70–99)
Glucose-Capillary: 95 mg/dL (ref 70–99)

## 2023-01-19 LAB — TROPONIN I (HIGH SENSITIVITY)
Troponin I (High Sensitivity): 40 ng/L — ABNORMAL HIGH (ref <18)
Troponin I (High Sensitivity): 31 ng/L — ABNORMAL HIGH (ref <18)

## 2023-01-19 LAB — RESP PANEL BY RT-PCR (RSV, FLU A&B, COVID)  RVPGX2
Influenza A by PCR: POSITIVE — AB
Influenza B by PCR: NEGATIVE
Resp Syncytial Virus by PCR: NEGATIVE
SARS Coronavirus 2 by RT PCR: NEGATIVE

## 2023-01-19 LAB — CBG MONITORING, ED: Glucose-Capillary: 92 mg/dL (ref 70–99)

## 2023-01-19 LAB — D-DIMER, QUANTITATIVE: D-Dimer, Quant: 3.91 ug{FEU}/mL — ABNORMAL HIGH (ref 0.00–0.50)

## 2023-01-19 LAB — LACTIC ACID, PLASMA
Lactic Acid, Venous: 4 mmol/L (ref 0.5–1.9)
Lactic Acid, Venous: 3.1 mmol/L (ref 0.5–1.9)

## 2023-01-19 MED ORDER — ETOMIDATE 2 MG/ML IV SOLN: 20.0000 mg | Freq: Once | INTRAVENOUS | Status: AC

## 2023-01-19 MED ORDER — PROPOFOL 1000 MG/100ML IV EMUL: 0.0000 ug/kg/min | INTRAVENOUS | Status: DC

## 2023-01-19 MED ORDER — LEVALBUTEROL HCL 0.63 MG/3ML IN NEBU
0.6300 mg | INHALATION_SOLUTION | Freq: Four times a day (QID) | RESPIRATORY_TRACT | Status: DC | PRN
  Administered 2023-01-23 – 2023-02-04 (×3): 0.63 mg via RESPIRATORY_TRACT
  Filled 2023-01-19 (×3): qty 3

## 2023-01-19 MED ORDER — CALCIUM GLUCONATE-NACL 1-0.675 GM/50ML-% IV SOLN
1.0000 g | Freq: Once | INTRAVENOUS | Status: AC
  Administered 2023-01-19: 1000 mg via INTRAVENOUS
  Filled 2023-01-19: qty 50

## 2023-01-19 MED ORDER — DOCUSATE SODIUM 50 MG/5ML PO LIQD
100.0000 mg | Freq: Two times a day (BID) | ORAL | Status: DC
  Administered 2023-01-19 – 2023-01-20 (×2): 100 mg
  Filled 2023-01-19 (×2): qty 10

## 2023-01-19 MED ORDER — ETOMIDATE 2 MG/ML IV SOLN
INTRAVENOUS | Status: AC
  Administered 2023-01-19: 20 mg via INTRAVENOUS
  Filled 2023-01-19: qty 10

## 2023-01-19 MED ORDER — PROPOFOL 1000 MG/100ML IV EMUL
INTRAVENOUS | Status: AC
  Administered 2023-01-19: 50 ug/kg/min via INTRAVENOUS
  Filled 2023-01-19: qty 100

## 2023-01-19 MED ORDER — POLYETHYLENE GLYCOL 3350 17 G PO PACK
17.0000 g | PACK | Freq: Every day | ORAL | Status: DC | PRN
Start: 2023-01-19 — End: 2023-01-21

## 2023-01-19 MED ORDER — SODIUM CHLORIDE 0.9 % IV SOLN
2.0000 g | INTRAVENOUS | Status: DC
  Administered 2023-01-20 – 2023-01-22 (×3): 2 g via INTRAVENOUS
  Filled 2023-01-19 (×3): qty 20

## 2023-01-19 MED ORDER — POTASSIUM CHLORIDE 20 MEQ PO PACK
80.0000 meq | PACK | Freq: Once | ORAL | Status: AC
  Administered 2023-01-19: 80 meq
  Filled 2023-01-19: qty 4

## 2023-01-19 MED ORDER — DOCUSATE SODIUM 100 MG PO CAPS: 100.0000 mg | ORAL_CAPSULE | Freq: Two times a day (BID) | ORAL | Status: DC | PRN

## 2023-01-19 MED ORDER — PROPOFOL 1000 MG/100ML IV EMUL
5.0000 ug/kg/min | INTRAVENOUS | Status: DC
  Administered 2023-01-19: 50 ug/kg/min via INTRAVENOUS

## 2023-01-19 MED ORDER — SODIUM CHLORIDE 0.9 % IV BOLUS: 1000.0000 mL | Freq: Once | INTRAVENOUS | Status: DC

## 2023-01-19 MED ORDER — METRONIDAZOLE 500 MG/100ML IV SOLN
500.0000 mg | Freq: Two times a day (BID) | INTRAVENOUS | Status: DC
  Administered 2023-01-20 – 2023-01-21 (×3): 500 mg via INTRAVENOUS
  Filled 2023-01-19 (×3): qty 100

## 2023-01-19 MED ORDER — HEPARIN SODIUM (PORCINE) 5000 UNIT/ML IJ SOLN
5000.0000 [IU] | Freq: Three times a day (TID) | INTRAMUSCULAR | Status: DC
  Administered 2023-01-19 – 2023-01-22 (×8): 5000 [IU] via SUBCUTANEOUS
  Filled 2023-01-19 (×8): qty 1

## 2023-01-19 MED ORDER — VANCOMYCIN HCL 1000 MG IV SOLR
INTRAVENOUS | Status: AC
  Administered 2023-01-19: 1000 mg
  Filled 2023-01-19: qty 20

## 2023-01-19 MED ORDER — IOHEXOL 300 MG/ML  SOLN
100.0000 mL | Freq: Once | INTRAMUSCULAR | Status: AC | PRN
  Administered 2023-01-19: 100 mL via INTRAVENOUS

## 2023-01-19 MED ORDER — SODIUM CHLORIDE 0.9 % IV SOLN
1.0000 g | INTRAVENOUS | Status: DC
  Filled 2023-01-19: qty 10

## 2023-01-19 MED ORDER — OSELTAMIVIR PHOSPHATE 75 MG PO CAPS
75.0000 mg | ORAL_CAPSULE | Freq: Two times a day (BID) | ORAL | Status: DC
  Administered 2023-01-19 – 2023-01-23 (×8): 75 mg
  Filled 2023-01-19 (×10): qty 1

## 2023-01-19 MED ORDER — LACTATED RINGERS IV SOLN: INTRAVENOUS | Status: AC

## 2023-01-19 MED ORDER — ROCURONIUM BROMIDE 10 MG/ML (PF) SYRINGE
PREFILLED_SYRINGE | INTRAVENOUS | Status: AC
  Administered 2023-01-19: 100 mg via INTRAVENOUS
  Filled 2023-01-19: qty 10

## 2023-01-19 MED ORDER — SODIUM CHLORIDE 0.9 % IV BOLUS (SEPSIS)
1000.0000 mL | Freq: Once | INTRAVENOUS | Status: AC
  Administered 2023-01-19: 1000 mL via INTRAVENOUS

## 2023-01-19 MED ORDER — SODIUM CHLORIDE 0.9 % IV SOLN
1000.0000 mL | INTRAVENOUS | Status: DC
  Administered 2023-01-19: 1000 mL via INTRAVENOUS

## 2023-01-19 MED ORDER — VANCOMYCIN HCL IN DEXTROSE 1-5 GM/200ML-% IV SOLN
1000.0000 mg | Freq: Once | INTRAVENOUS | Status: AC
  Administered 2023-01-19: 1000 mg via INTRAVENOUS
  Filled 2023-01-19: qty 200

## 2023-01-19 MED ORDER — VANCOMYCIN HCL 750 MG IV SOLR
INTRAVENOUS | Status: AC
  Filled 2023-01-19: qty 15

## 2023-01-19 MED ORDER — SODIUM CHLORIDE 0.9 % IV BOLUS
1000.0000 mL | Freq: Once | INTRAVENOUS | Status: AC
  Administered 2023-01-19: 1000 mL via INTRAVENOUS

## 2023-01-19 MED ORDER — PROPOFOL 1000 MG/100ML IV EMUL: 5.0000 ug/kg/min | INTRAVENOUS | Status: DC

## 2023-01-19 MED ORDER — PANTOPRAZOLE SODIUM 40 MG IV SOLR
40.0000 mg | Freq: Every day | INTRAVENOUS | Status: DC
  Administered 2023-01-19 – 2023-02-01 (×14): 40 mg via INTRAVENOUS
  Filled 2023-01-19 (×14): qty 10

## 2023-01-19 MED ORDER — INSULIN ASPART 100 UNIT/ML IJ SOLN
0.0000 [IU] | INTRAMUSCULAR | Status: DC
  Administered 2023-01-20: 1 [IU] via SUBCUTANEOUS
  Administered 2023-01-20: 2 [IU] via SUBCUTANEOUS
  Administered 2023-01-20: 1 [IU] via SUBCUTANEOUS
  Administered 2023-01-20 – 2023-01-21 (×2): 2 [IU] via SUBCUTANEOUS
  Administered 2023-01-22: 1 [IU] via SUBCUTANEOUS
  Administered 2023-01-22: 2 [IU] via SUBCUTANEOUS
  Administered 2023-01-22: 1 [IU] via SUBCUTANEOUS
  Administered 2023-01-22 (×2): 2 [IU] via SUBCUTANEOUS
  Administered 2023-01-22 (×2): 1 [IU] via SUBCUTANEOUS
  Administered 2023-01-23 (×3): 2 [IU] via SUBCUTANEOUS
  Administered 2023-01-24: 1 [IU] via SUBCUTANEOUS
  Administered 2023-01-24: 2 [IU] via SUBCUTANEOUS
  Administered 2023-01-25 (×2): 1 [IU] via SUBCUTANEOUS
  Administered 2023-01-25: 2 [IU] via SUBCUTANEOUS
  Administered 2023-01-25 – 2023-01-26 (×2): 1 [IU] via SUBCUTANEOUS
  Administered 2023-01-26 (×3): 2 [IU] via SUBCUTANEOUS
  Administered 2023-01-26 – 2023-01-27 (×6): 1 [IU] via SUBCUTANEOUS
  Administered 2023-01-27: 2 [IU] via SUBCUTANEOUS
  Administered 2023-01-28 (×4): 1 [IU] via SUBCUTANEOUS
  Administered 2023-01-29: 5 [IU] via SUBCUTANEOUS
  Administered 2023-01-29 – 2023-01-31 (×6): 1 [IU] via SUBCUTANEOUS

## 2023-01-19 MED ORDER — IPRATROPIUM-ALBUTEROL 0.5-2.5 (3) MG/3ML IN SOLN
3.0000 mL | Freq: Every day | RESPIRATORY_TRACT | Status: DC
  Administered 2023-01-19: 3 mL via RESPIRATORY_TRACT
  Filled 2023-01-19: qty 3

## 2023-01-19 MED ORDER — FENTANYL BOLUS VIA INFUSION
50.0000 ug | INTRAVENOUS | Status: DC | PRN
  Administered 2023-01-20: 50 ug via INTRAVENOUS

## 2023-01-19 MED ORDER — VANCOMYCIN HCL 750 MG/150ML IV SOLN: 750.0000 mg | Freq: Two times a day (BID) | INTRAVENOUS | Status: DC

## 2023-01-19 MED ORDER — LEVALBUTEROL HCL 0.63 MG/3ML IN NEBU: 0.6300 mg | INHALATION_SOLUTION | RESPIRATORY_TRACT | Status: DC | PRN

## 2023-01-19 MED ORDER — DOCUSATE SODIUM 50 MG/5ML PO LIQD: 100.0000 mg | Freq: Two times a day (BID) | ORAL | Status: DC | PRN

## 2023-01-19 MED ORDER — ROCURONIUM BROMIDE 10 MG/ML (PF) SYRINGE: 100.0000 mg | PREFILLED_SYRINGE | Freq: Once | INTRAVENOUS | Status: AC

## 2023-01-19 MED ORDER — FENTANYL CITRATE PF 50 MCG/ML IJ SOSY
50.0000 ug | PREFILLED_SYRINGE | Freq: Once | INTRAMUSCULAR | Status: DC
Start: 2023-01-19 — End: 2023-01-21

## 2023-01-19 MED ORDER — VANCOMYCIN HCL IN DEXTROSE 1-5 GM/200ML-% IV SOLN
1000.0000 mg | INTRAVENOUS | Status: DC
Start: 2023-01-20 — End: 2023-01-21
  Administered 2023-01-20: 1000 mg via INTRAVENOUS
  Filled 2023-01-19: qty 200

## 2023-01-19 MED ORDER — POLYETHYLENE GLYCOL 3350 17 G PO PACK
17.0000 g | PACK | Freq: Every day | ORAL | Status: DC
  Administered 2023-01-19 – 2023-01-20 (×2): 17 g
  Filled 2023-01-19 (×2): qty 1

## 2023-01-19 MED ORDER — FENTANYL 2500MCG IN NS 250ML (10MCG/ML) PREMIX INFUSION
0.0000 ug/h | INTRAVENOUS | Status: DC
  Administered 2023-01-19: 50 ug/h via INTRAVENOUS
  Filled 2023-01-19: qty 250

## 2023-01-19 MED ORDER — VANCOMYCIN HCL 1750 MG/350ML IV SOLN: 1750.0000 mg | Freq: Once | INTRAVENOUS | Status: DC

## 2023-01-19 MED ORDER — NOREPINEPHRINE 4 MG/250ML-% IV SOLN
INTRAVENOUS | Status: AC
  Administered 2023-01-19: 4 mg via INTRAVENOUS
  Filled 2023-01-19: qty 250

## 2023-01-19 MED ORDER — VANCOMYCIN HCL 500 MG IV SOLR: 500.0000 mg | Freq: Once | INTRAVENOUS | Status: DC

## 2023-01-19 MED ORDER — FENTANYL 2500MCG IN NS 250ML (10MCG/ML) PREMIX INFUSION: 0.0000 ug/h | INTRAVENOUS | Status: DC

## 2023-01-19 MED ORDER — PIPERACILLIN-TAZOBACTAM 3.375 G IVPB
3.3750 g | Freq: Once | INTRAVENOUS | Status: AC
  Administered 2023-01-19: 3.375 g via INTRAVENOUS
  Filled 2023-01-19: qty 50

## 2023-01-19 NOTE — H&P (Signed)
NAME:  Janett Hameister, MRN:  [id number], DOB:  1970/08/15, LOS: 0 ADMISSION DATE:  01/19/2023, CONSULTATION DATE: 01/19/2023 REFERRING MD: Royanne Foots, DO, CHIEF COMPLAINT: AMS and hypoxia today  History of Present Illness:  A 52 y.o. female patient with mild asthma on Albuterol PRN, DM-2, HTN, dyslipidemia, hypothyroidism, GERD, and tobacco smoking (1 ppd for 30 yrs) who presented to ED with AMS, hypoxia 48% on RA, SOB, DOE, for dry cough for the last 2 days. Today he noted her to be confused and very somnolent while sitting on the couch with labored breathing. Regarding sick contacts, her husband notes the patient was watching a grandchild with recent viral URI symptoms earlier this week. No F/c/r, N/V/D, abd pain, rash, CP, illicit drug use, or alcohol intake.    Pertinent  Medical History  Mild asthma on Albuterol PRN, DM-2, HTN, dyslipidemia, hypothyroidism, GERD  Significant Hospital Events: Including procedures, antibiotic start and stop dates in addition to other pertinent events   Given Zosyn and Vanco before transfer  Intubated 7.5 ETT IVF: 4 L NS 0.9% boluses  Bedside Echo: NL EF and intravascular depletion.  Interim History / Subjective:    Objective   Blood pressure (!) 139/55, pulse (!) 123, temperature 98 F (36.7 C), resp. rate (!) 24, height 5' 4.02" (1.626 m), weight 63.3 kg, SpO2 100%.    Vent Mode: PRVC FiO2 (%):  [80 %-100 %] 80 % Set Rate:  [24 bmp] 24 bmp Vt Set:  [440 mL-450 mL] 440 mL PEEP:  [5 cmH20] 5 cmH20 Plateau Pressure:  [19 cmH20] 19 cmH20  No intake or output data in the 24 hours ending 01/19/23 2053 Filed Weights   01/19/23 2018  Weight: 63.3 kg    Examination: General: intubated and sedated on Propofol 5 mcg/kg/min, Fentanyl 50 mcg/hr, and Levophed 5 mg/min HENT: PERL. No LNE or thyromegaly. No JVD Lungs: symmetrical air entry bilaterally. Diffuse crackles. No wheezing Cardiovascular: NL S1/S2. No m/g/r Abdomen: no distension or  tenderness Extremities: no edema. Symmetrical  Neuro: sedated   Resolved Hospital Problem list     Assessment & Plan:  Acute hypoxic hypercapnic resp failure due to vial PNA (influenza A) ARDS low volume and high PEEP protocol Repeat ABG Xopenex PRN Tamiflu for 5 days MRSA screening F/u Cx and urine Ags Hold steroids  No further Abx for now   Sepsis due to viral PNA  Hypok Replace Am labs  Thrombocytopenia: due to acute illness Will monitor  Lactic acidosis and shock due to dehydration and sepsis  IVF Serial LA Am labs I/O chart  01/16/2023 Echo: EF 60%, G1DD  Metabolic encephalopathy: due to sepsis and resp failure  Mild asthma Xopenex PRN  DM-2 ISS HBA1c  HTN Hold BP meds  Dyslipidemia Hold statin  Hypothyroidism Resume home meds  GERD PPI  Tobacco smoking (30 PY)  Counseling when appropriate   Best Practice (right click and "Reselect all SmartList Selections" daily)   Diet/type: NPO/NGT for meds and TF DVT prophylaxis LMWH Pressure ulcer(s): N/A GI prophylaxis: PPI Lines: N/A Foley:  Yes, and it is still needed Code Status:  full code Last date of multidisciplinary goals of care discussion [husband and mother]  Labs   CBC: Recent Labs  Lab 01/19/23 1708 01/19/23 1710 01/19/23 1903  WBC 1.0*  --   --   NEUTROABS 0.8*  --   --   HGB 13.5 11.2* 9.5*  HCT 41.2 33.0* 28.0*  MCV 89.4  --   --  PLT 123*  --   --     Basic Metabolic Panel: Recent Labs  Lab 01/19/23 1708 01/19/23 1710 01/19/23 1903  NA 141 141 144  K 3.1* 2.6* 2.7*  CL 105  --   --   CO2 23  --   --   GLUCOSE 97  --   --   BUN 19  --   --   CREATININE 1.11*  --   --   CALCIUM 8.0*  --   --    GFR: Estimated Creatinine Clearance: 52.4 mL/min (A) (by C-G formula based on SCr of 1.11 mg/dL (H)). Recent Labs  Lab 01/19/23 1708  WBC 1.0*  LATICACIDVEN 4.0*    Liver Function Tests: Recent Labs  Lab 01/19/23 1708  AST 34  ALT 14  ALKPHOS 76   BILITOT 0.4  PROT 5.7*  ALBUMIN 3.4*   No results for input(s): "LIPASE", "AMYLASE" in the last 168 hours. No results for input(s): "AMMONIA" in the last 168 hours.  ABG    Component Value Date/Time   PHART 7.102 (LL) 01/19/2023 1903   PCO2ART 64.2 (H) 01/19/2023 1903   PO2ART 229 (H) 01/19/2023 1903   HCO3 20.2 01/19/2023 1903   TCO2 22 01/19/2023 1903   ACIDBASEDEF 10.0 (H) 01/19/2023 1903   O2SAT 100 01/19/2023 1903     Coagulation Profile: No results for input(s): "INR", "PROTIME" in the last 168 hours.  Cardiac Enzymes: No results for input(s): "CKTOTAL", "CKMB", "CKMBINDEX", "TROPONINI" in the last 168 hours.  HbA1C: Hgb A1c MFr Bld  Date/Time Value Ref Range Status  09/18/2022 01:06 PM 4.9 4.8 - 5.6 % Final    Comment:             Prediabetes: 5.7 - 6.4          Diabetes: >6.4          Glycemic control for adults with diabetes: <7.0   12/18/2016 05:00 PM 6.3 (H) 4.8 - 5.6 % Final    Comment:    (NOTE) Pre diabetes:          5.7%-6.4% Diabetes:              >6.4% Glycemic control for   <7.0% adults with diabetes     CBG: Recent Labs  Lab 01/19/23 1650 01/19/23 2037  GLUCAP 92 102*    Review of Systems:   NA: intubated   Past Medical History:  She,  has a past medical history of Allergy, Anxiety, Arthritis, Asthma, Bruises easily, Chronic chest wall pain, Chronic pain, Cough, Depression, Diabetes mellitus without complication (HCC), Dyspnea, Encounter for therapeutic drug monitoring (02/11/2011), Fibromyalgia, Functional movement disorder, GERD (gastroesophageal reflux disease), Headache, Hearing loss, High cholesterol, History of hiatal hernia, Hypertension, Hypothyroidism, Kidney disease, Leg swelling, Nausea (02/11/2011), Nausea and vomiting (04/01/2013), Pain management, PONV (postoperative nausea and vomiting), SMAS (superior mesenteric artery syndrome) (HCC), Thyroid disease, TIA (transient ischemic attack), Tic disorder, TMJ (dislocation of  temporomandibular joint), Transient cerebral ischemia, and Type 2 diabetes mellitus with complications (HCC).   Surgical History:   Past Surgical History:  Procedure Laterality Date   ABDOMINAL HYSTERECTOMY     AORTA - SUPERIOR MESENTERIC AND AORTA - RENAL ARTERY BYPASS GRAFT     APPENDECTOMY     BREAST CYST EXCISION Right    BREAST EXCISIONAL BIOPSY     CHOLECYSTECTOMY     cyst removed     HERNIA REPAIR     OOPHORECTOMY     bilateral   TEE  WITHOUT CARDIOVERSION N/A 06/18/2012   Procedure: TRANSESOPHAGEAL ECHOCARDIOGRAM (TEE);  Surgeon: Thurmon Fair, MD;  Location: [facility] ENDOSCOPY;  Service: Cardiovascular;  Laterality: N/A;     Social History:   reports that she has been smoking cigarettes. She has a 7.5 pack-year smoking history. She has never used smokeless tobacco. She reports that she does not drink alcohol and does not use drugs.   Family History:  Her family history includes Asthma in her father and maternal grandfather; Breast cancer in her maternal grandmother and paternal grandmother; Cancer in her paternal grandmother; Clotting disorder in her paternal grandfather; Emphysema in her maternal grandmother; Heart disease in her maternal grandfather, maternal grandmother, paternal grandfather, and paternal grandmother; Hypertension in her sister; Leukemia in her paternal grandfather; Parkinson's disease in her maternal aunt; Pulmonary fibrosis in her maternal grandmother.   Allergies Allergies  Allergen Reactions   Latex Anaphylaxis and Swelling   Codeine Nausea And Vomiting    Other reaction(s): GI Upset (intolerance), Vomiting (intolerance)   Ibuprofen Other (See Comments)    GI upset, burning sensation   Oxycodone Nausea And Vomiting    Pre-medication with phenergan needed.   Tape Itching and Rash    Please use "paper" tape only.     Home Medications  Prior to Admission medications   Medication Sig Start Date End Date Taking? Authorizing Provider  albuterol  (PROVENTIL HFA;VENTOLIN HFA) 108 (90 BASE) MCG/ACT inhaler Inhale 2 puffs into the lungs every 6 (six) hours as needed. For shortness of breath    [provider]  atorvastatin (LIPITOR) 20 MG tablet Take 1 tablet (20 mg total) by mouth at bedtime. 11/04/22   Caudle, Shelton Silvas, FNP  buPROPion ER (WELLBUTRIN SR) 100 MG 12 hr tablet Take 1 tablet (100 mg total) by mouth daily. 11/04/22   Caudle, Shelton Silvas, FNP  diazepam (VALIUM) 5 MG tablet Take 1 tablet (5 mg total) by mouth 3 (three) times daily as needed for anxiety. 10/10/22   Caudle, Shelton Silvas, FNP  dicyclomine (BENTYL) 10 MG capsule Take 1 capsule (10 mg total) by mouth 4 (four) times daily -  before meals and at bedtime. 10/10/22   Caudle, Shelton Silvas, FNP  gabapentin (NEURONTIN) 600 MG tablet Take 1 tablet (600 mg total) by mouth at bedtime. 10/10/22   Caudle, Shelton Silvas, FNP  levothyroxine (SYNTHROID) 100 MCG tablet TAKE 1 TABLET BY MOUTH DAILY BEFORE BREAKFAST. 01/15/23   Caudle, Shelton Silvas, FNP  metFORMIN (GLUCOPHAGE-XR) 500 MG 24 hr tablet Take 1 tablet (500 mg total) by mouth daily with breakfast. 11/04/22   Caudle, Shelton Silvas, FNP  mupirocin cream (BACTROBAN) 2 % Apply 1 Application topically 2 (two) times daily. 12/02/22   Caudle, Shelton Silvas, FNP  omeprazole (PRILOSEC) 20 MG capsule Take 20 mg by mouth daily.    [provider]  promethazine (PHENERGAN) 25 MG tablet Take 1 tablet (25 mg total) by mouth every 8 (eight) hours as needed for nausea or vomiting. 10/10/22   Caudle, Shelton Silvas, FNP  propranolol (INDERAL) 80 MG tablet Take 1 tablet (80 mg total) by mouth daily. 10/10/22   Caudle, Shelton Silvas, FNP  traZODone (DESYREL) 100 MG tablet Take 1 tablet (100 mg total) by mouth at bedtime. 09/18/22   CaudleShelton Silvas, FNP    D/w mother and husband   Critical care time: 60 min

## 2023-01-19 NOTE — ED Notes (Signed)
Sabrina Roberson with cl called for transport

## 2023-01-19 NOTE — ED Notes (Signed)
RT at bedside to set up Bip[facility].

## 2023-01-19 NOTE — ED Provider Notes (Signed)
[place] EMERGENCY DEPARTMENT AT [facility] Provider Note   CSN: [id number] Arrival date & time: 01/19/23  1640     History  Chief Complaint  Patient presents with   Altered Mental Status    Sabrina Roberson is a 52 y.o. female.  With a history of asthma, type 2 diabetes, hypertension hyperlipidemia and CKD who presents to the ED for shortness of breath.  Husband reports the patient has experienced worsening shortness of breath and coughing at home over the last 2 days.  Today he noted her to be confused and very somnolent while sitting on the couch with labored breathing.  He brought her directly to the emergency department for further evaluation.  The patient herself is tachypneic confused and unable to provide additional history at this time.  Regarding sick contacts, her husband notes the patient was watching a grandchild with recent viral URI symptoms earlier this week   Altered Mental Status      Home Medications Prior to Admission medications   Medication Sig Start Date End Date Taking? Authorizing Provider  albuterol (PROVENTIL HFA;VENTOLIN HFA) 108 (90 BASE) MCG/ACT inhaler Inhale 2 puffs into the lungs every 6 (six) hours as needed. For shortness of breath    [provider]  atorvastatin (LIPITOR) 20 MG tablet Take 1 tablet (20 mg total) by mouth at bedtime. 11/04/22   Caudle, Shelton Silvas, FNP  buPROPion ER (WELLBUTRIN SR) 100 MG 12 hr tablet Take 1 tablet (100 mg total) by mouth daily. 11/04/22   Caudle, Shelton Silvas, FNP  diazepam (VALIUM) 5 MG tablet Take 1 tablet (5 mg total) by mouth 3 (three) times daily as needed for anxiety. 10/10/22   Caudle, Shelton Silvas, FNP  dicyclomine (BENTYL) 10 MG capsule Take 1 capsule (10 mg total) by mouth 4 (four) times daily -  before meals and at bedtime. 10/10/22   Caudle, Shelton Silvas, FNP  gabapentin (NEURONTIN) 600 MG tablet Take 1 tablet (600 mg total) by mouth at bedtime. 10/10/22   Caudle, Shelton Silvas, FNP   levothyroxine (SYNTHROID) 100 MCG tablet TAKE 1 TABLET BY MOUTH DAILY BEFORE BREAKFAST. 01/15/23   Caudle, Shelton Silvas, FNP  metFORMIN (GLUCOPHAGE-XR) 500 MG 24 hr tablet Take 1 tablet (500 mg total) by mouth daily with breakfast. 11/04/22   Caudle, Shelton Silvas, FNP  mupirocin cream (BACTROBAN) 2 % Apply 1 Application topically 2 (two) times daily. 12/02/22   Caudle, Shelton Silvas, FNP  omeprazole (PRILOSEC) 20 MG capsule Take 20 mg by mouth daily.    [provider]  promethazine (PHENERGAN) 25 MG tablet Take 1 tablet (25 mg total) by mouth every 8 (eight) hours as needed for nausea or vomiting. 10/10/22   Caudle, Shelton Silvas, FNP  propranolol (INDERAL) 80 MG tablet Take 1 tablet (80 mg total) by mouth daily. 10/10/22   Caudle, Shelton Silvas, FNP  traZODone (DESYREL) 100 MG tablet Take 1 tablet (100 mg total) by mouth at bedtime. 09/18/22   Caudle, Shelton Silvas, FNP      Allergies    Latex, Codeine, Ibuprofen, Oxycodone, and Tape    Review of Systems   Review of Systems  Physical Exam Updated Vital Signs BP (!) 139/55   Pulse (!) 123   Temp 98 F (36.7 C)   Resp (!) 24   Ht 5' 4.02" (1.626 m)   SpO2 100%   BMI 26.11 kg/m  Physical Exam Vitals and nursing note reviewed.  Constitutional:      General: She is in  acute distress.     Appearance: She is ill-appearing.  HENT:     Head: Normocephalic and atraumatic.  Eyes:     Pupils: Pupils are equal, round, and reactive to light.  Cardiovascular:     Rate and Rhythm: Normal rate and regular rhythm.  Pulmonary:     Comments: Respiratory distress with labored breathing and no adventitious lung sounds Abdominal:     Palpations: Abdomen is soft.     Tenderness: There is no abdominal tenderness.  Skin:    General: Skin is warm and dry.  Neurological:     General: No focal deficit present.     Mental Status: She is disoriented.     Comments: Able to follow commands and move all 4 extremities with equal strength   Psychiatric:        Mood and Affect: Mood normal.     ED Results / Procedures / Treatments   Labs (all labs ordered are listed, but only abnormal results are displayed) Labs Reviewed  RESP PANEL BY RT-PCR (RSV, FLU A&B, COVID)  RVPGX2 - Abnormal; Notable for the following components:      Result Value   Influenza A by PCR POSITIVE (*)    All other components within normal limits  COMPREHENSIVE METABOLIC PANEL - Abnormal; Notable for the following components:   Potassium 3.1 (*)    Creatinine, Ser 1.11 (*)    Calcium 8.0 (*)    Total Protein 5.7 (*)    Albumin 3.4 (*)    All other components within normal limits  CBC WITH DIFFERENTIAL/PLATELET - Abnormal; Notable for the following components:   WBC 1.0 (*)    Platelets 123 (*)    nRBC 2.1 (*)    Neutro Abs 0.8 (*)    Lymphs Abs 0.2 (*)    Monocytes Absolute 0.0 (*)    All other components within normal limits  D-DIMER, QUANTITATIVE - Abnormal; Notable for the following components:   D-Dimer, Quant 3.91 (*)    All other components within normal limits  LACTIC ACID, PLASMA - Abnormal; Notable for the following components:   Lactic Acid, Venous 4.0 (*)    All other components within normal limits  I-STAT ARTERIAL BLOOD GAS, ED - Abnormal; Notable for the following components:   pH, Arterial 7.150 (*)    pCO2 arterial 59.7 (*)    Acid-base deficit 8.0 (*)    Potassium 2.6 (*)    Calcium, Ion 1.08 (*)    HCT 33.0 (*)    Hemoglobin 11.2 (*)    All other components within normal limits  I-STAT ARTERIAL BLOOD GAS, ED - Abnormal; Notable for the following components:   pH, Arterial 7.102 (*)    pCO2 arterial 64.2 (*)    pO2, Arterial 229 (*)    Acid-base deficit 10.0 (*)    Potassium 2.7 (*)    Calcium, Ion 1.02 (*)    HCT 28.0 (*)    Hemoglobin 9.5 (*)    All other components within normal limits  TROPONIN I (HIGH SENSITIVITY) - Abnormal; Notable for the following components:   Troponin I (High Sensitivity) 40 (*)     All other components within normal limits  CULTURE, BLOOD (ROUTINE X 2)  CULTURE, BLOOD (ROUTINE X 2)  LACTIC ACID, PLASMA  CBG MONITORING, ED  TROPONIN I (HIGH SENSITIVITY)    EKG None  Radiology CT CHEST ABDOMEN PELVIS W CONTRAST Result Date: 01/19/2023 CLINICAL DATA:  Sepsis.  Shortness of breath.  Cough. EXAM:  CT CHEST, ABDOMEN, AND PELVIS WITH CONTRAST TECHNIQUE: Multidetector CT imaging of the chest, abdomen and pelvis was performed following the standard protocol during bolus administration of intravenous contrast. RADIATION DOSE REDUCTION: This exam was performed according to the departmental dose-optimization program which includes automated exposure control, adjustment of the mA and/or kV according to patient size and/or use of iterative reconstruction technique. CONTRAST:  OMNIPAQUE IOHEXOL 300 MG/ML  SOLN COMPARISON:  10/03/2022 FINDINGS: CT CHEST FINDINGS Cardiovascular: Heart is normal size. Aorta is normal caliber. Mediastinum/Nodes: No mediastinal, hilar, or axillary adenopathy. Endotracheal tube tip in the lower trachea. Thyroid and esophagus unremarkable. Lungs/Pleura: Extensive bilateral airspace opacities. No significant effusions. Musculoskeletal: Chest wall soft tissues are unremarkable. No acute bony abnormality. CT ABDOMEN PELVIS FINDINGS Hepatobiliary: No focal liver abnormality is seen. Status post cholecystectomy. No biliary dilatation. Pancreas: No focal abnormality or ductal dilatation. Spleen: No focal abnormality.  Normal size. Adrenals/Urinary Tract: Adrenal glands normal. 2.8 cm cyst in the upper pole of the left kidney warrants no additional follow-up imaging. No stones or hydronephrosis. Urinary bladder unremarkable. Foley catheter in place. Stomach/Bowel: NG tube in the stomach. Stomach and small bowel decompressed, unremarkable. There is wall thickening involving the cecum and ascending colon as well as distal transverse colon and splenic flexure compatible  with colitis. No bowel obstruction. Vascular/Lymphatic: Aortic atherosclerosis. No evidence of aneurysm or adenopathy. Reproductive: Prior hysterectomy.  No adnexal masses. Other: No free fluid or free air. Musculoskeletal: No acute bony abnormality. 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 IMPRESSION: Extensive bilateral airspace disease throughout both lungs. This is concerning for multifocal pneumonia. This could also reflect edema. Wall thickening involving the right colon and distal transverse colon/splenic flexure compatible with infectious or inflammatory colitis. Aortic atherosclerosis. Electronically Signed   By: Charlett Nose M.D.   On: 01/19/2023 19:22   DG Chest Portable 1 View Result Date: 01/19/2023 CLINICAL DATA:  Intubation and nasogastric tube placement. EXAM: PORTABLE CHEST 1 VIEW COMPARISON:  01/19/2023 at 5:15 p.m. FINDINGS: Interval placement of enteric tube which courses into the stomach and off the film as tip is not visualized. Interval placement of endotracheal tube which has tip 2.9 cm above the carina. Lungs are adequately inflated demonstrate moderate bilateral patchy airspace opacification with slight interval worsening. Evidence of small right effusion. Remainder of the exam is unchanged. IMPRESSION: 1. Interval placement of endotracheal tube and enteric tube as described. 2. Moderate bilateral patchy airspace opacification with slight interval worsening likely multifocal pneumonia. Small right effusion. Electronically Signed   By: Elberta Fortis M.D.   On: 01/19/2023 19:15   DG Chest Portable 1 View Result Date: 01/19/2023 CLINICAL DATA:  Cough.  Unresponsive in ED. EXAM: PORTABLE CHEST 1 VIEW COMPARISON:  Chest radiograph dated October 09, 2014. FINDINGS: The heart size and mediastinal contours are within normal limits. Multifocal bilateral airspace opacities, most pronounced at the left lung base and  right mid to upper lung zone. No definite pleural effusion. No pneumothorax. No acute osseous abnormality. IMPRESSION: Multifocal bilateral airspace opacities are concerning for multifocal pneumonia, ARDS, or pulmonary edema. Electronically Signed   By: Hart Robinsons M.D.   On: 01/19/2023 17:44    Procedures Ultrasound ED Echo  Date/Time: 01/19/2023 7:45 PM  Performed by: Royanne Foots, DO Authorized by: Royanne Foots, DO   Procedure details:    Indications: hypotension and dyspnea     Views: subxiphoid, parasternal long axis view, parasternal  short axis view, apical 4 chamber view and IVC view     Images: archived   Findings:    Pericardium: no pericardial effusion and small pericardial effusion     Cardiac Activity: hyperdynamic     LV Function: normal (>50% EF)     RV Diameter: normal     IVC: collapsed   Impression:    Impression comment:  Respiratory variation in IVC greater than 50% consistent with hypovolemiaSmall pericardial effusion.  No evidence of right heart strain or tamponade physiology Ultrasound ED Thoracic  Date/Time: 01/19/2023 7:46 PM  Performed by: Royanne Foots, DO Authorized by: Royanne Foots, DO   Procedure details:    Indications: dyspnea and hypoxia     Assessment for:  Pleural effusion and interstitial syndrome   Left lung pleural:  Visualized   Right lung pleural:  Visualized   Images: archived   Findings:    B-lines noted throughout: identified   Right Lung Findings:     no right lung sliding Left Lung Findings:     no left lung sliding Impression:    Impression: interstitial syndrome   Comments:     B-lines in bilateral lung fields left greater than right Procedure Name: Intubation Date/Time: 01/19/2023 5:50 PM  Performed by: Royanne Foots, DOPre-anesthesia Checklist: Patient identified, Patient being monitored, Emergency Drugs available, Timeout performed and Suction available Oxygen Delivery Method: Non-rebreather  mask Preoxygenation: Pre-oxygenation with 100% oxygen Induction Type: Rapid sequence Ventilation: Mask ventilation without difficulty Laryngoscope Size: Glidescope and 4 Grade View: Grade I Tube size: 7.5 mm Number of attempts: 1 Airway Equipment and Method: Video-laryngoscopy and Rigid stylet Placement Confirmation: ETT inserted through vocal cords under direct vision, CO2 detector and Breath sounds checked- equal and bilateral Secured at: 24 cm Tube secured with: ETT holder    .Critical Care  Performed by: Royanne Foots, DO Authorized by: Royanne Foots, DO   Critical care provider statement:    Critical care time (minutes):  90   Critical care time was exclusive of:  Separately billable procedures and treating other patients and teaching time   Critical care was necessary to treat or prevent imminent or life-threatening deterioration of the following conditions:  Shock, respiratory failure and circulatory failure   Critical care was time spent personally by me on the following activities:  Development of treatment plan with patient or surrogate, discussions with consultants, evaluation of patient's response to treatment, examination of patient, ordering and review of laboratory studies, ordering and review of radiographic studies, ordering and performing treatments and interventions, pulse oximetry, re-evaluation of patient's condition and review of old charts   I assumed direction of critical care for this patient from another provider in my specialty: no     Care discussed with: admitting provider   Comments:     Discussed with intensivist Dr. Tonia Brooms     Medications Ordered in ED Medications  sodium chloride 0.9 % bolus 1,000 mL (1,000 mLs Intravenous New Bag/Given 01/19/23 1724)    Followed by  sodium chloride 0.9 % bolus 1,000 mL (1,000 mLs Intravenous New Bag/Given 01/19/23 1724)    Followed by  sodium chloride 0.9 % bolus 1,000 mL (1,000 mLs Intravenous New Bag/Given  01/19/23 1725)    Followed by  0.9 %  sodium chloride infusion (has no administration in time range)  vancomycin (VANCOCIN) IVPB 1000 mg/200 mL premix (1,000 mg Intravenous New Bag/Given 01/19/23 1913)    Followed by  vancomycin (VANCOCIN) 500 mg in sodium  chloride 0.9 % 100 mL IVPB (has no administration in time range)  vancomycin (VANCOCIN) 750 MG injection (has no administration in time range)  fentaNYL in NS (81mcg/ml) infusion-PREMIX (50 mcg/hr Intravenous New Bag/Given 01/19/23 1828)  propofol (DIPRIVAN) 1000 MG/100ML infusion (50 mcg/kg/min  68 kg (Order-Specific) Intravenous New Bag/Given 01/19/23 1910)  piperacillin-tazobactam (ZOSYN) IVPB 3.375 g (0 g Intravenous Stopped 01/19/23 1910)  vancomycin (VANCOCIN) 1000 MG powder (1,000 mg  Given 01/19/23 1805)  norepinephrine (LEVOPHED) 4-5 MG/250ML-% infusion SOLN (2.66 mcg/min  Rate/Dose Change 01/19/23 1909)  etomidate (AMIDATE) injection 20 mg (20 mg Intravenous Given 01/19/23 1908)  rocuronium (ZEMURON) injection 100 mg (100 mg Intravenous Given 01/19/23 1911)  iohexol (OMNIPAQUE) 300 MG/ML solution 100 mL (100 mLs Intravenous Contrast Given 01/19/23 1838)  sodium chloride 0.9 % bolus 1,000 mL (1,000 mLs Intravenous New Bag/Given 01/19/23 1730)    ED Course/ Medical Decision Making/ A&P                                 Medical Decision Making 52 year old female with history as above presenting in acute respiratory distress through triage.  2 days of proceeding respiratory illness with coughing, shortness of breath and generalized weakness.  Became acutely confused and tachypneic at home today.  Upon initial assessment she is respiratory distress with tachypnea tachycardia and hypotension.  We initiated resuscitative efforts with transition to bag valve mask ventilation and 15 L nonrebreather.  After IV access was obtained we started IV fluid bolus given concern for hypotension.  She was initially afebrile.  Differential  diagnosis for undifferentiated shock included septic shock, hypovolemia, cardiogenic.  Lower suspicion for neurogenic shock or hemorrhagic shock given lack of focal neurologic deficits and lack of trauma on history.  Bedside ultrasound revealed no evidence of right heart strain or septal bowing that would be consistent with massive PE as underlying etiology.  Ultrasound of the lungs did reveal B-lines concerning for respiratory etiology such as pneumonia, viral respiratory illness, ARDS and pulmonary edema.  Portable chest x-ray taken shortly thereafter did show multifocal opacities compatible with ARDS.  In addition to 30 cc/kg IV fluid bolus we administered IV vancomycin and IV Zosyn for undifferentiated shock with potential infectious cause.  Patient's mental status did not improve after transitioning to BiPAP.  Initial blood gas revealed severe respiratory acidosis.  Patient was still borderline hypotensive after adequate fluid resuscitation and we initiated Levophed for hemodynamic support.  Given this clinical picture and laboratory findings the decision was made to proceed with intubation after speaking with the patient and her husband at bedside.  Rapid sequence intubation with etomidate and roc.  7.5 ET tube placed 24 cm at the teeth advanced 2 cm to 26 cm at the teeth after chest x-ray with good capnography.  OG tube placed and confirmed as well.  I discussed this case with intensivist Dr. Tonia Brooms who voices concern for ARDS and accepts patient in transfer to [facility] long ICU.  We have stabilized her hemodynamic status following intubation on propofol, fentanyl and Levophed.  We will obtain CT chest abdomen pelvis with IV contrast here to get a better look at the respiratory process she is up against and evaluate for any intra-abdominal infection as well.  Amount and/or Complexity of Data Reviewed Labs: ordered. Radiology: ordered.  Risk Prescription drug management. Decision regarding  hospitalization.           Final Clinical Impression(s) /  ED Diagnoses Final diagnoses:  ARDS (adult respiratory distress syndrome) (HCC)  Septic shock (HCC)  History of asthma  Chronic kidney disease, unspecified CKD stage    Rx / DC Orders ED Discharge Orders     None         Royanne Foots, DO 01/19/23 1952

## 2023-01-19 NOTE — ED Notes (Signed)
Md at bedside

## 2023-01-19 NOTE — ED Notes (Signed)
Pt arrived to triage presenting w/ AMS and was taken to room 11. Room air sats noted to be 48 and pt respirations were assisted w/ BVM until sats reached 100. CXR showed fluid on lungs. Pt difficult to arouse. Pt unable to protect her airway. MD decision to intubate. ET tube placement verified via XR. OG tube placed. Foley cath placed. Pt taken to CT. Pt then transferred to [facility] stretcher. Report given to IP RN.

## 2023-01-19 NOTE — ED Notes (Signed)
Kim with cl called to page Critical Care

## 2023-01-19 NOTE — Progress Notes (Addendum)
eLink Physician-Brief Progress Note Patient Name: Sabrina Roberson DOB: 12-Sep-1970 MRN: [id number]   Date of Service  01/19/2023  HPI/Events of Note  52 yr old female just arrived to ICU for  AHHRF-ARDS-multifocal pneumonia, Influenza A +. On  lung protective Ventilation. CT chest with contrast- no PE. Leukopenia, Neutrophils > 800.  - on sedation, low dose levo just started per RN discussion. VT at 440 ( follow ARDS net protocol) - on tamiflu, Vancomycin. - follow cultures  2. Neutropenic sepsis from multifocal pneumonia-viral. CT abdomen: colitis vs inflammation.   2. Hypokalmia. On replacements. Keep > 4. Mag at 2.1  Camera: Evaluation done and discussed.   Data: Reviewed    eICU Interventions  - follow labs, troponin      Intervention Category Major Interventions: Respiratory failure - evaluation and management Evaluation Type: New Patient Evaluation  Ranee Gosselin 01/19/2023, 7:56 PM  23:19  Follow up:  ABG improving respiratory acidosis, P/F ratio at 80. LA 3.1 improving from > 4. from [facility].  Neutropenic sepsis shock. Placed on precautions. Ct abdomen: suspected colitis vs inflammation, no enteritis. Received vancomycin. Discussed with CCM MD, he is  Ordering rocephine, flagyl.  Troponin trending down.  23:40 Get CxR stat, ET tip moved per RT. VS stable.  00:36 CxR images seen and compared to old one showing ET tip at [facility], moved inwards by  1 to 2 cm or so. Worsning ARDS.  - discussed with RN. Pull upward ET tube by 1 cm. Follow CxR in AM.   02:07 Tylenol ordered via N\G tube for fever prn.   05:13  Levo re ordered Camera evaluation done for Bilateral, non violent soft wrist restriants ordered to prevent self injury and harm from pullineg lines/tubes.   5:52 Levo at 10, septic shock. All sedation. Fio2 at 80%.improving pH to 7.21 Just sent our AM labs.  Discussed with RN.

## 2023-01-19 NOTE — Progress Notes (Signed)
Pharmacy Antibiotic Note  Sabrina Roberson is a 52 y.o. female admitted on 01/19/2023 with sepsis, multi-focal pneumonia, influenza A, and colitis.  Pharmacy has been consulted for Vancomycin dosing.  In the ED patient received Zosyn 3.375gm IV x 1 and Vancomycin 2gm IV loading dose.  Plan: Vancomycin 1000 mg IV Q 24 hrs. Goal AUC 400-550. Expected AUC: 458.5  SCr used: 1.11 Ceftriaxone and Metronidazole per MD Follow renal function F/u culture results & sensitivities  Height: 5' 4.02" (162.6 cm) Weight: 63.3 kg (139 lb 8.8 oz) IBW/kg (Calculated) : 54.74  Temp (24hrs), Avg:98.2 F (36.8 C), Min:96.5 F (35.8 C), Max:99 F (37.2 C)  Recent Labs  Lab 01/19/23 1708 01/19/23 2024  WBC 1.0*  --   CREATININE 1.11*  --   LATICACIDVEN 4.0* 3.1*    Estimated Creatinine Clearance: 52.4 mL/min (A) (by C-G formula based on SCr of 1.11 mg/dL (H)).    Allergies  Allergen Reactions   Latex Anaphylaxis and Swelling   Codeine Nausea And Vomiting    Other reaction(s): GI Upset (intolerance), Vomiting (intolerance)   Ibuprofen Other (See Comments)    GI upset, burning sensation   Oxycodone Nausea And Vomiting    Pre-medication with phenergan needed.   Tape Itching and Rash    Please use "paper" tape only.    Antimicrobials this admission: 12/22 Zosyn x 1  12/22 Vancomycin >>   12/23 Ceftriaxone >> 12/23 Metronidazole >>  Dose adjustments this admission:    Microbiology results: 12/22 BCx:   12/22 MRSA PCR:    Thank you for allowing pharmacy to be a part of this patient's care.  Maryellen Pile, PharmD 01/19/2023 11:50 PM

## 2023-01-19 NOTE — ED Notes (Signed)
Intubated with 7.5 E T T secured at 24 at lip. Co2 indicator was positive for tracheal intubation.

## 2023-01-19 NOTE — ED Notes (Signed)
RT in the room and bagged pt til her sats above 90, on 100% nonrebreather

## 2023-01-19 NOTE — ED Triage Notes (Signed)
Pt 's husband states she had cough yesterday, not feeling well,. Today sleeping a lot, confused,hard to arouse.in triage hard to arouse, falling out of wheelchair, hardly answer questions. Immediately taken to room

## 2023-01-19 NOTE — ED Notes (Signed)
Md still at bedside, using ultrasound to assess heart.

## 2023-01-20 ENCOUNTER — Inpatient Hospital Stay (HOSPITAL_COMMUNITY): Payer: Medicare Other

## 2023-01-20 DIAGNOSIS — R578 Other shock: Secondary | ICD-10-CM | POA: Diagnosis not present

## 2023-01-20 DIAGNOSIS — J80 Acute respiratory distress syndrome: Principal | ICD-10-CM

## 2023-01-20 DIAGNOSIS — R6521 Severe sepsis with septic shock: Secondary | ICD-10-CM | POA: Diagnosis not present

## 2023-01-20 DIAGNOSIS — A419 Sepsis, unspecified organism: Secondary | ICD-10-CM | POA: Diagnosis not present

## 2023-01-20 LAB — BLOOD GAS, ARTERIAL
Acid-base deficit: 8 mmol/L — ABNORMAL HIGH (ref 0.0–2.0)
Acid-base deficit: 10.2 mmol/L — ABNORMAL HIGH (ref 0.0–2.0)
Bicarbonate: 19.6 mmol/L — ABNORMAL LOW (ref 20.0–28.0)
Bicarbonate: 16.8 mmol/L — ABNORMAL LOW (ref 20.0–28.0)
Drawn by: 11249
Drawn by: 29503
FIO2: 80 %
FIO2: 50 %
MECHVT: 430 mL
MECHVT: 430 mL
O2 Saturation: 96.8 %
O2 Saturation: 96.9 %
PEEP: 10 cmH2O
PEEP: 10 cmH2O
Patient temperature: 38.1
Patient temperature: 37
RATE: 24 {breaths}/min
RATE: 24 {breaths}/min
pCO2 arterial: 50 mm[Hg] — ABNORMAL HIGH (ref 32–48)
pCO2 arterial: 40 mm[Hg] (ref 32–48)
pH, Arterial: 7.21 — ABNORMAL LOW (ref 7.35–7.45)
pH, Arterial: 7.23 — ABNORMAL LOW (ref 7.35–7.45)
pO2, Arterial: 94 mm[Hg] (ref 83–108)
pO2, Arterial: 103 mm[Hg] (ref 83–108)

## 2023-01-20 LAB — BASIC METABOLIC PANEL
Anion gap: 11 (ref 5–15)
BUN: 16 mg/dL (ref 6–20)
CO2: 18 mmol/L — ABNORMAL LOW (ref 22–32)
Calcium: 7.4 mg/dL — ABNORMAL LOW (ref 8.9–10.3)
Chloride: 112 mmol/L — ABNORMAL HIGH (ref 98–111)
Creatinine, Ser: 0.8 mg/dL (ref 0.44–1.00)
GFR, Estimated: >60 mL/min (ref >60)
Glucose, Bld: 96 mg/dL (ref 70–99)
Potassium: 2.9 mmol/L — ABNORMAL LOW (ref 3.5–5.1)
Sodium: 141 mmol/L (ref 135–145)

## 2023-01-20 LAB — ECHOCARDIOGRAM COMPLETE
Area-P 1/2: 6.17 cm2
Calc EF: 53.2 %
Height: 64.016 in
S' Lateral: 3.7 cm
Single Plane A2C EF: 47.3 %
Single Plane A4C EF: 59.7 %
Weight: 2232.82 [oz_av]

## 2023-01-20 LAB — COMPREHENSIVE METABOLIC PANEL
ALT: 13 U/L (ref 0–44)
AST: 40 U/L (ref 15–41)
Albumin: 1.7 g/dL — ABNORMAL LOW (ref 3.5–5.0)
Alkaline Phosphatase: 102 U/L (ref 38–126)
Anion gap: 8 (ref 5–15)
BUN: 22 mg/dL — ABNORMAL HIGH (ref 6–20)
CO2: 18 mmol/L — ABNORMAL LOW (ref 22–32)
Calcium: 6.5 mg/dL — ABNORMAL LOW (ref 8.9–10.3)
Chloride: 109 mmol/L (ref 98–111)
Creatinine, Ser: 0.99 mg/dL (ref 0.44–1.00)
GFR, Estimated: >60 mL/min (ref >60)
Glucose, Bld: 193 mg/dL — ABNORMAL HIGH (ref 70–99)
Potassium: 4.8 mmol/L (ref 3.5–5.1)
Sodium: 135 mmol/L (ref 135–145)
Total Bilirubin: 1.5 mg/dL — ABNORMAL HIGH (ref <1.2)
Total Protein: 3.6 g/dL — ABNORMAL LOW (ref 6.5–8.1)

## 2023-01-20 LAB — GLUCOSE, CAPILLARY
Glucose-Capillary: 123 mg/dL — ABNORMAL HIGH (ref 70–99)
Glucose-Capillary: 133 mg/dL — ABNORMAL HIGH (ref 70–99)
Glucose-Capillary: 157 mg/dL — ABNORMAL HIGH (ref 70–99)
Glucose-Capillary: 160 mg/dL — ABNORMAL HIGH (ref 70–99)
Glucose-Capillary: 89 mg/dL (ref 70–99)
Glucose-Capillary: 97 mg/dL (ref 70–99)

## 2023-01-20 LAB — CBC
HCT: 36.7 % (ref 36.0–46.0)
Hemoglobin: 11.6 g/dL — ABNORMAL LOW (ref 12.0–15.0)
MCH: 29.4 pg (ref 26.0–34.0)
MCHC: 31.6 g/dL (ref 30.0–36.0)
MCV: 92.9 fL (ref 80.0–100.0)
Platelets: 104 10*3/uL — ABNORMAL LOW (ref 150–400)
RBC: 3.95 MIL/uL (ref 3.87–5.11)
RDW: 15.3 % (ref 11.5–15.5)
WBC: 1 10*3/uL — CL (ref 4.0–10.5)
nRBC: 0 % (ref 0.0–0.2)

## 2023-01-20 LAB — HEMOGLOBIN A1C
Hgb A1c MFr Bld: 6.9 % — ABNORMAL HIGH (ref 4.8–5.6)
Mean Plasma Glucose: 151.33 mg/dL

## 2023-01-20 LAB — CORTISOL: Cortisol, Plasma: 22.7 ug/dL

## 2023-01-20 LAB — LACTIC ACID, PLASMA
Lactic Acid, Venous: 3.6 mmol/L (ref 0.5–1.9)
Lactic Acid, Venous: 2.2 mmol/L (ref 0.5–1.9)

## 2023-01-20 LAB — PHOSPHORUS: Phosphorus: 1.4 mg/dL — ABNORMAL LOW (ref 2.5–4.6)

## 2023-01-20 LAB — COOXEMETRY PANEL
Carboxyhemoglobin: 0.5 % (ref 0.5–1.5)
Methemoglobin: <0.7 % (ref 0.0–1.5)
O2 Saturation: 86 %
Total hemoglobin: 10.7 g/dL — ABNORMAL LOW (ref 12.0–16.0)

## 2023-01-20 LAB — HIV ANTIBODY (ROUTINE TESTING W REFLEX): HIV Screen 4th Generation wRfx: NONREACTIVE

## 2023-01-20 LAB — BRAIN NATRIURETIC PEPTIDE: B Natriuretic Peptide: 268.5 pg/mL — ABNORMAL HIGH (ref 0.0–100.0)

## 2023-01-20 LAB — MRSA NEXT GEN BY PCR, NASAL: MRSA by PCR Next Gen: DETECTED — AB

## 2023-01-20 LAB — TRIGLYCERIDES: Triglycerides: 126 mg/dL (ref <150)

## 2023-01-20 LAB — PROCALCITONIN: Procalcitonin: 17.63 ng/mL

## 2023-01-20 LAB — MAGNESIUM: Magnesium: 1.5 mg/dL — ABNORMAL LOW (ref 1.7–2.4)

## 2023-01-20 MED ORDER — VASOPRESSIN 20 UNITS/100 ML INFUSION FOR SHOCK
0.0000 [IU]/min | INTRAVENOUS | Status: DC
  Administered 2023-01-20 – 2023-01-21 (×3): 0.03 [IU]/min via INTRAVENOUS
  Filled 2023-01-20 (×3): qty 100

## 2023-01-20 MED ORDER — ORAL CARE MOUTH RINSE
15.0000 mL | OROMUCOSAL | Status: DC
  Administered 2023-01-20 – 2023-01-29 (×108): 15 mL via OROMUCOSAL

## 2023-01-20 MED ORDER — CHLORHEXIDINE GLUCONATE CLOTH 2 % EX PADS
6.0000 | MEDICATED_PAD | Freq: Every day | CUTANEOUS | Status: DC
  Administered 2023-01-20: 6 via TOPICAL

## 2023-01-20 MED ORDER — ACETAMINOPHEN 500 MG PO TABS
500.0000 mg | ORAL_TABLET | Freq: Four times a day (QID) | ORAL | Status: DC | PRN
  Administered 2023-01-20: 500 mg via NASOGASTRIC

## 2023-01-20 MED ORDER — NOREPINEPHRINE 4 MG/250ML-% IV SOLN
0.0000 ug/min | INTRAVENOUS | Status: DC
Start: 2023-01-20 — End: 2023-01-20
  Administered 2023-01-20: 32 ug/min via INTRAVENOUS
  Administered 2023-01-20: 23 ug/min via INTRAVENOUS
  Administered 2023-01-20: 35 ug/min via INTRAVENOUS
  Administered 2023-01-20: 20 ug/min via INTRAVENOUS
  Administered 2023-01-20: 35 ug/min via INTRAVENOUS
  Filled 2023-01-20 (×6): qty 250

## 2023-01-20 MED ORDER — ORAL CARE MOUTH RINSE: 15.0000 mL | OROMUCOSAL | Status: DC | PRN

## 2023-01-20 MED ORDER — SODIUM CHLORIDE 0.9 % IV SOLN
500.0000 mg | Freq: Every day | INTRAVENOUS | Status: DC
  Administered 2023-01-20 – 2023-01-21 (×2): 500 mg via INTRAVENOUS
  Filled 2023-01-20 (×3): qty 5

## 2023-01-20 MED ORDER — HYDROCORTISONE SOD SUC (PF) 100 MG IJ SOLR
100.0000 mg | Freq: Once | INTRAMUSCULAR | Status: AC
  Administered 2023-01-20: 100 mg via INTRAVENOUS
  Filled 2023-01-20: qty 2

## 2023-01-20 MED ORDER — POTASSIUM PHOSPHATES 15 MMOLE/5ML IV SOLN: 30.0000 mmol | Freq: Once | INTRAVENOUS | Status: DC

## 2023-01-20 MED ORDER — HYDROCORTISONE SOD SUC (PF) 100 MG IJ SOLR
50.0000 mg | Freq: Four times a day (QID) | INTRAMUSCULAR | Status: DC
  Administered 2023-01-20 – 2023-01-24 (×15): 50 mg via INTRAVENOUS
  Filled 2023-01-20 (×15): qty 2

## 2023-01-20 MED ORDER — NOREPINEPHRINE 16 MG/250ML-% IV SOLN
0.0000 ug/min | INTRAVENOUS | Status: DC
  Administered 2023-01-20: 23 ug/min via INTRAVENOUS
  Administered 2023-01-21: 15 ug/min via INTRAVENOUS
  Administered 2023-01-22: 6 ug/min via INTRAVENOUS
  Filled 2023-01-20 (×3): qty 250

## 2023-01-20 MED ORDER — SODIUM CHLORIDE 0.9 % IV SOLN: INTRAVENOUS | Status: DC | PRN

## 2023-01-20 MED ORDER — CHLORHEXIDINE GLUCONATE CLOTH 2 % EX PADS
6.0000 | MEDICATED_PAD | Freq: Every day | CUTANEOUS | Status: AC
  Administered 2023-01-22 – 2023-01-25 (×4): 6 via TOPICAL

## 2023-01-20 MED ORDER — MAGNESIUM SULFATE 2 GM/50ML IV SOLN
2.0000 g | Freq: Once | INTRAVENOUS | Status: AC
  Administered 2023-01-20: 2 g via INTRAVENOUS
  Filled 2023-01-20: qty 50

## 2023-01-20 MED ORDER — PERFLUTREN LIPID MICROSPHERE
1.0000 mL | INTRAVENOUS | Status: AC | PRN
  Administered 2023-01-20: 3 mL via INTRAVENOUS

## 2023-01-20 MED ORDER — NOREPINEPHRINE 4 MG/250ML-% IV SOLN
INTRAVENOUS | Status: AC
  Filled 2023-01-20: qty 250

## 2023-01-20 MED ORDER — ACETAMINOPHEN 500 MG PO TABS
ORAL_TABLET | ORAL | Status: AC
  Filled 2023-01-20: qty 1

## 2023-01-20 MED ORDER — POTASSIUM PHOSPHATES 15 MMOLE/5ML IV SOLN
30.0000 mmol | Freq: Once | INTRAVENOUS | Status: AC
  Administered 2023-01-20: 30 mmol via INTRAVENOUS
  Filled 2023-01-20: qty 10

## 2023-01-20 MED ORDER — MUPIROCIN 2 % EX OINT
1.0000 | TOPICAL_OINTMENT | Freq: Two times a day (BID) | CUTANEOUS | Status: AC
  Administered 2023-01-20 – 2023-01-24 (×10): 1 via NASAL
  Filled 2023-01-20: qty 22

## 2023-01-20 MED ORDER — VITAL 1.5 CAL PO LIQD
1000.0000 mL | ORAL | Status: DC
  Administered 2023-01-20 – 2023-01-22 (×3): 1000 mL
  Filled 2023-01-20 (×6): qty 1000

## 2023-01-20 MED ORDER — POTASSIUM CHLORIDE 20 MEQ PO PACK
40.0000 meq | PACK | Freq: Two times a day (BID) | ORAL | Status: AC
  Administered 2023-01-20 (×2): 40 meq
  Filled 2023-01-20 (×2): qty 2

## 2023-01-20 MED ORDER — LACTATED RINGERS IV BOLUS
500.0000 mL | Freq: Once | INTRAVENOUS | Status: AC
  Administered 2023-01-20: 500 mL via INTRAVENOUS

## 2023-01-20 NOTE — Progress Notes (Signed)
NAME:  Sabrina Roberson, MRN:  [id number], DOB:  1970/01/30, LOS: 1 ADMISSION DATE:  01/19/2023, CONSULTATION DATE: 01/19/2023 REFERRING MD: Royanne Foots, DO, CHIEF COMPLAINT: AMS and hypoxia today  History of Present Illness:  A 52 y.o. female patient with mild asthma on Albuterol PRN, DM-2, HTN, dyslipidemia, hypothyroidism, GERD, and tobacco smoking (1 ppd for 30 yrs) who presented to ED with AMS, hypoxia 48% on RA, SOB, DOE, for dry cough for the last 2 days. Today he noted her to be confused and very somnolent while sitting on the couch with labored breathing. Regarding sick contacts, her husband notes the patient was watching a grandchild with recent viral URI symptoms earlier this week. No F/c/r, N/V/D, abd pain, rash, CP, illicit drug use, or alcohol intake.    Pertinent  Medical History  Mild asthma on Albuterol PRN, DM-2, HTN, dyslipidemia, hypothyroidism, GERD  Significant Hospital Events: Including procedures, antibiotic start and stop dates in addition to other pertinent events   12/22 Admit with ARDS secondary to Influenza A PNA 12/23 Worsening ARDS, septic shock   Interim History / Subjective:  Following commands on vent   Objective   Blood pressure (!) 80/51, pulse (!) 115, temperature (!) 100.6 F (38.1 C), resp. rate (!) 24, height 5' 4.02" (1.626 m), weight 63.3 kg, SpO2 100%.    Vent Mode: PRVC FiO2 (%):  [80 %-100 %] 80 % Set Rate:  [24 bmp] 24 bmp Vt Set:  [430 mL-450 mL] 430 mL PEEP:  [5 cmH20-10 cmH20] 10 cmH20 Plateau Pressure:  [19 cmH20-25 cmH20] 25 cmH20   Intake/Output Summary (Last 24 hours) at 01/20/2023 0743 Last data filed at 01/20/2023 9147 Gross per 24 hour  Intake 3882.99 ml  Output 625 ml  Net 3257.99 ml   Filed Weights   01/19/23 2018 01/20/23 0500  Weight: 63.3 kg 63.3 kg    Examination: General: acutely ill middle aged female, vented, lying on ICU bed HENT: Normocephalic, Poor dentition, Pink MM, ETT, OG tube Lungs: right upper  clear, left upper rhonchi, diminished throughout, no distress  Cardiovascular: s1, s2, RRR, sinus tachy, no JVD  Abdomen: soft, BS active  Extremities: moves all extremities on commands, BL wrist restraints  Neuro: RASS 0 to -1, follows commands, PERRLA intact 2+  GU: Foley, amber urine   Resolved Hospital Problem list   N/a   Assessment & Plan:  Acute hypoxic hypercapnic resp failure due to viral PNA (influenza A) Severe ARDS Mild Asthma Hx  PF ratio 117.5 per ABG at 12/23  Fio2 requirements decreasing-now 60% from 80%, PEEP 10  Patient compliant with vent with no to minimal sedation, Plat < 30  12/23 BL infiltrates on CXR  P:  Continue ventilator support with lung protective strategies per ARDS protocol  Wean PEEP and FiO2 for sats greater than 88%. Permissive hypercapnia, pH goal 7.2 or better. Head of bed elevated 30 degrees. Plateau pressures less than 30 cm H20 as able Follow intermittent chest x-ray and ABG Hold SAT/SBT for now  Ensure adequate pulmonary hygiene  Continue to Follow cultures  VAP bundle in place  PAD protocol with RASS goal -1 to -2, continue to utilize fentanyl gtt  Continue Vanc, cetriaxone, and flagyl, will add azithromycin for atypical coverage  Continue Tamiflu    Septic shock in setting viral PNA  Leukopenia  Possible colitis seen on CT abdominal scan P: Continue vanc, cetriaxone and flagyl as above  MRSA PCR pending, if neg dc vanc  Send Cortisol  Place on stress dose steroids  Repeat lactic acid MAP goal > 65, continue to titrate levophed Will add vasopressin  Continue maintenance LR fluids, increase to 126ml/hr  CVP q 4hrs  Check BNP and Coox    Thrombocytopenia in setting of Septic shock  12/23 104 plt P: Continue to trend CBC daily Continue to monitor for signs of bleeding   Fluid Electrolyte imbalance  K, phos, mag low  P:  Ordered 2 g of Mag, K phos, 40 meq of K x 2 per tube  Recheck BMET at 1600  DM-2 09/18/22 HBA1c 4.9    CBGs stable  P: Continue CBG q4 SSI  Will place order for tube feeds  HTN P: Continue to hold antihypertensives  Dyslipidemia P: Continue to hold statin for now   Hypothyroidism P:  Restart levothyroxine when appropriate   GERD P:  PPI protonix daily   Tobacco smoking (30 PY)  P:  Educate about cessation when appropriate   Latex Allergy hx Latex foley in place P: Remove and replace temp foley with non-latex    Best Practice (right click and "Reselect all SmartList Selections" daily)   Diet/type: NPO/NGT for meds and TF DVT prophylaxis  heparin  Pressure ulcer(s): N/A GI prophylaxis: PPI Lines: CVC 12/23  Foley:  Yes, and it is still needed Code Status:  full code Last date of multidisciplinary goals of care discussion [husband and mother on 12/23    Critical care time:50    Hazel Sams AGACNP-BC   [facility] 01/20/2023, 10:46 AM  Please see [email or web address] for pager details.  From 7A-7P if no response, please call [phone]. After hours, please call ELink [phone].

## 2023-01-20 NOTE — Procedures (Signed)
Arterial Catheter Insertion Procedure Note  Solita Kynard  [id number]  1970/11/17  Date:01/20/23  Time:4:33 PM    Provider Performing: Henrene Hawking    Procedure: Insertion of Arterial Line (65784) with US guidance ([id number])   Indication(s) Blood pressure monitoring and/or need for frequent ABGs  Consent Risks of the procedure as well as the alternatives and risks of each were explained to the patient and/or caregiver.  Consent for the procedure was obtained and is signed in the bedside chart  Anesthesia None   Time Out Verified patient identification, verified procedure, site/side was marked, verified correct patient position, special equipment/implants available, medications/allergies/relevant history reviewed, required imaging and test results available.   Sterile Technique Maximal sterile technique including full sterile barrier drape, hand hygiene, sterile gown, sterile gloves, mask, hair covering, sterile ultrasound probe cover (if used).   Procedure Description Area of catheter insertion was cleaned with chlorhexidine and draped in sterile fashion. With real-time ultrasound guidance an arterial catheter was placed into the left femoral artery.  Appropriate arterial tracings confirmed on monitor.     Complications/Tolerance None; patient tolerated the procedure well.   EBL Minimal   Specimen(s)   Hazel Sams AGACNP-BC   [facility] 01/20/2023, 4:34 PM  Please see [email or web address] for pager details.  From 7A-7P if no response, please call [phone]. After hours, please call ELink [phone].  None

## 2023-01-20 NOTE — H&P (Incomplete)
NAME:  Elah Gallman, MRN:  [id number], DOB:  [birth year], LOS: 0 ADMISSION DATE:  01/19/2023, CONSULTATION DATE: 01/19/2023 REFERRING MD: Royanne Foots, DO, CHIEF COMPLAINT: AMS and hypoxia today  History of Present Illness:  A 52 y.o. female patient with mild asthma on Albuterol PRN, DM-2, HTN, dyslipidemia, hypothyroidism, GERD, and tobacco smoking (1 ppd for 30 yrs) who presented to ED with AMS, hypoxia 48% on RA, SOB, DOE, for dry cough for the last 2 days. Today he noted her to be confused and very somnolent while sitting on the couch with labored breathing. Regarding sick contacts, her husband notes the patient was watching a grandchild with recent viral URI symptoms earlier this week. No F/c/r, N/V/D, abd pain, rash, CP, illicit drug use, or alcohol intake.    Pertinent  Medical History  Mild asthma on Albuterol PRN, DM-2, HTN, dyslipidemia, hypothyroidism, GERD  Significant Hospital Events: Including procedures, antibiotic start and stop dates in addition to other pertinent events   Given Zosyn and Vanco before transfer  Intubated 7.5 ETT IVF: 4 L NS 0.9% boluses  Bedside Echo: NL EF and intravascular depletion.  Interim History / Subjective:    Objective   Blood pressure (!) 139/55, pulse (!) 123, temperature 98 F (36.7 C), resp. rate (!) 24, height 5' 4.02" (1.626 m), weight 63.3 kg, SpO2 100%.    Vent Mode: PRVC FiO2 (%):  [80 %-100 %] 80 % Set Rate:  [24 bmp] 24 bmp Vt Set:  [440 mL-450 mL] 440 mL PEEP:  [5 cmH20] 5 cmH20 Plateau Pressure:  [19 cmH20] 19 cmH20  No intake or output data in the 24 hours ending 01/19/23 2053 Filed Weights   01/19/23 2018  Weight: 63.3 kg    Examination: General: intubated and sedated on Propofol 5 mcg/kg/min, Fentanyl 50 mcg/hr, and Levophed 5 mg/min HENT: PERL. No LNE or thyromegaly. No JVD Lungs: symmetrical air entry bilaterally. Diffuse crackles. No wheezing Cardiovascular: NL S1/S2. No m/g/r Abdomen: no distension or  tenderness Extremities: no edema. Symmetrical  Neuro: sedated   Resolved Hospital Problem list     Assessment & Plan:  Acute hypoxic hypercapnic resp failure due to vial PNA (influenza A) ARDS low volume and high PEEP protocol Repeat ABG Xopenex PRN Tamiflu for 5 days MRSA screening F/u Cx and urine Ags Hold steroids  No further Abx for now   Sepsis due to viral PNA  Hypok Replace Am labs  Thrombocytopenia: due to acute illness Will monitor  Lactic acidosis and shock due to dehydration and sepsis  IVF Serial LA Am labs I/O chart  01/16/2023 Echo: EF 60%, G1DD  Metabolic encephalopathy: due to sepsis and resp failure  Mild asthma Xopenex PRN  DM-2 ISS HBA1c  HTN Hold BP meds  Dyslipidemia Hold statin  Hypothyroidism Resume home meds  GERD PPI  Tobacco smoking (30 PY)  Counseling when appropriate   Best Practice (right click and "Reselect all SmartList Selections" daily)   Diet/type: NPO/NGT for meds and TF DVT prophylaxis LMWH Pressure ulcer(s): N/A GI prophylaxis: PPI Lines: N/A Foley:  Yes, and it is still needed Code Status:  full code Last date of multidisciplinary goals of care discussion [husband and mother]  Labs   CBC: Recent Labs  Lab 01/19/23 1708 01/19/23 1710 01/19/23 1903  WBC 1.0*  --   --   NEUTROABS 0.8*  --   --   HGB 13.5 11.2* 9.5*  HCT 41.2 33.0* 28.0*  MCV 89.4  --   --  PLT 123*  --   --     Basic Metabolic Panel: Recent Labs  Lab 01/19/23 1708 01/19/23 1710 01/19/23 1903  NA 141 141 144  K 3.1* 2.6* 2.7*  CL 105  --   --   CO2 23  --   --   GLUCOSE 97  --   --   BUN 19  --   --   CREATININE 1.11*  --   --   CALCIUM 8.0*  --   --    GFR: Estimated Creatinine Clearance: 52.4 mL/min (A) (by C-G formula based on SCr of 1.11 mg/dL (H)). Recent Labs  Lab 01/19/23 1708  WBC 1.0*  LATICACIDVEN 4.0*    Liver Function Tests: Recent Labs  Lab 01/19/23 1708  AST 34  ALT 14  ALKPHOS 76   BILITOT 0.4  PROT 5.7*  ALBUMIN 3.4*   No results for input(s): "LIPASE", "AMYLASE" in the last 168 hours. No results for input(s): "AMMONIA" in the last 168 hours.  ABG    Component Value Date/Time   PHART 7.102 (LL) 01/19/2023 1903   PCO2ART 64.2 (H) 01/19/2023 1903   PO2ART 229 (H) 01/19/2023 1903   HCO3 20.2 01/19/2023 1903   TCO2 22 01/19/2023 1903   ACIDBASEDEF 10.0 (H) 01/19/2023 1903   O2SAT 100 01/19/2023 1903     Coagulation Profile: No results for input(s): "INR", "PROTIME" in the last 168 hours.  Cardiac Enzymes: No results for input(s): "CKTOTAL", "CKMB", "CKMBINDEX", "TROPONINI" in the last 168 hours.  HbA1C: Hgb A1c MFr Bld  Date/Time Value Ref Range Status  09/18/2022 01:06 PM 4.9 4.8 - 5.6 % Final    Comment:             Prediabetes: 5.7 - 6.4          Diabetes: >6.4          Glycemic control for adults with diabetes: <7.0   12/18/2016 05:00 PM 6.3 (H) 4.8 - 5.6 % Final    Comment:    (NOTE) Pre diabetes:          5.7%-6.4% Diabetes:              >6.4% Glycemic control for   <7.0% adults with diabetes     CBG: Recent Labs  Lab 01/19/23 1650 01/19/23 2037  GLUCAP 92 102*    Review of Systems:   NA: intubated   Past Medical History:  She,  has a past medical history of Allergy, Anxiety, Arthritis, Asthma, Bruises easily, Chronic chest wall pain, Chronic pain, Cough, Depression, Diabetes mellitus without complication (HCC), Dyspnea, Encounter for therapeutic drug monitoring (02/11/2011), Fibromyalgia, Functional movement disorder, GERD (gastroesophageal reflux disease), Headache, Hearing loss, High cholesterol, History of hiatal hernia, Hypertension, Hypothyroidism, Kidney disease, Leg swelling, Nausea (02/11/2011), Nausea and vomiting (04/01/2013), Pain management, PONV (postoperative nausea and vomiting), SMAS (superior mesenteric artery syndrome) (HCC), Thyroid disease, TIA (transient ischemic attack), Tic disorder, TMJ (dislocation of  temporomandibular joint), Transient cerebral ischemia, and Type 2 diabetes mellitus with complications (HCC).   Surgical History:   Past Surgical History:  Procedure Laterality Date  . ABDOMINAL HYSTERECTOMY    . AORTA - SUPERIOR MESENTERIC AND AORTA - RENAL ARTERY BYPASS GRAFT    . APPENDECTOMY    . BREAST CYST EXCISION Right   . BREAST EXCISIONAL BIOPSY    . CHOLECYSTECTOMY    . cyst removed    . HERNIA REPAIR    . OOPHORECTOMY     bilateral  . TEE  WITHOUT CARDIOVERSION N/A 06/18/2012   Procedure: TRANSESOPHAGEAL ECHOCARDIOGRAM (TEE);  Surgeon: Thurmon Fair, MD;  Location: [facility] ENDOSCOPY;  Service: Cardiovascular;  Laterality: N/A;     Social History:   reports that she has been smoking cigarettes. She has a 7.5 pack-year smoking history. She has never used smokeless tobacco. She reports that she does not drink alcohol and does not use drugs.   Family History:  Her family history includes Asthma in her father and maternal grandfather; Breast cancer in her maternal grandmother and paternal grandmother; Cancer in her paternal grandmother; Clotting disorder in her paternal grandfather; Emphysema in her maternal grandmother; Heart disease in her maternal grandfather, maternal grandmother, paternal grandfather, and paternal grandmother; Hypertension in her sister; Leukemia in her paternal grandfather; Parkinson's disease in her maternal aunt; Pulmonary fibrosis in her maternal grandmother.   Allergies Allergies  Allergen Reactions  . Latex Anaphylaxis and Swelling  . Codeine Nausea And Vomiting    Other reaction(s): GI Upset (intolerance), Vomiting (intolerance)  . Ibuprofen Other (See Comments)    GI upset, burning sensation  . Oxycodone Nausea And Vomiting    Pre-medication with phenergan needed.  . Tape Itching and Rash    Please use "paper" tape only.     Home Medications  Prior to Admission medications   Medication Sig Start Date End Date Taking? Authorizing Provider   albuterol (PROVENTIL HFA;VENTOLIN HFA) 108 (90 BASE) MCG/ACT inhaler Inhale 2 puffs into the lungs every 6 (six) hours as needed. For shortness of breath    [provider]  atorvastatin (LIPITOR) 20 MG tablet Take 1 tablet (20 mg total) by mouth at bedtime. 11/04/22   Caudle, Shelton Silvas, FNP  buPROPion ER (WELLBUTRIN SR) 100 MG 12 hr tablet Take 1 tablet (100 mg total) by mouth daily. 11/04/22   Caudle, Shelton Silvas, FNP  diazepam (VALIUM) 5 MG tablet Take 1 tablet (5 mg total) by mouth 3 (three) times daily as needed for anxiety. 10/10/22   Caudle, Shelton Silvas, FNP  dicyclomine (BENTYL) 10 MG capsule Take 1 capsule (10 mg total) by mouth 4 (four) times daily -  before meals and at bedtime. 10/10/22   Caudle, Shelton Silvas, FNP  gabapentin (NEURONTIN) 600 MG tablet Take 1 tablet (600 mg total) by mouth at bedtime. 10/10/22   Caudle, Shelton Silvas, FNP  levothyroxine (SYNTHROID) 100 MCG tablet TAKE 1 TABLET BY MOUTH DAILY BEFORE BREAKFAST. 01/15/23   Caudle, Shelton Silvas, FNP  metFORMIN (GLUCOPHAGE-XR) 500 MG 24 hr tablet Take 1 tablet (500 mg total) by mouth daily with breakfast. 11/04/22   Caudle, Shelton Silvas, FNP  mupirocin cream (BACTROBAN) 2 % Apply 1 Application topically 2 (two) times daily. 12/02/22   Caudle, Shelton Silvas, FNP  omeprazole (PRILOSEC) 20 MG capsule Take 20 mg by mouth daily.    [provider]  promethazine (PHENERGAN) 25 MG tablet Take 1 tablet (25 mg total) by mouth every 8 (eight) hours as needed for nausea or vomiting. 10/10/22   Caudle, Shelton Silvas, FNP  propranolol (INDERAL) 80 MG tablet Take 1 tablet (80 mg total) by mouth daily. 10/10/22   Caudle, Shelton Silvas, FNP  traZODone (DESYREL) 100 MG tablet Take 1 tablet (100 mg total) by mouth at bedtime. 09/18/22   CaudleShelton Silvas, FNP    D/w mother and husband   Critical care time: 60 min

## 2023-01-20 NOTE — Procedures (Signed)
Central Venous Catheter Insertion Procedure Note  Sabrina Roberson  [id number]  Aug 18, 1970  Date:01/20/23  Time:2:49 PM   Provider Performing:Baron Parmelee Erby Pian   Procedure: Insertion of Non-tunneled Central Venous [phone]) with US guidance ([id number])   Indication(s) Medication administration  Consent Risks of the procedure as well as the alternatives and risks of each were explained to the patient and/or caregiver.  Consent for the procedure was obtained and is signed in the bedside chart  Anesthesia Topical only with 1% lidocaine   Timeout Verified patient identification, verified procedure, site/side was marked, verified correct patient position, special equipment/implants available, medications/allergies/relevant history reviewed, required imaging and test results available.  Sterile Technique Maximal sterile technique including full sterile barrier drape, hand hygiene, sterile gown, sterile gloves, mask, hair covering, sterile ultrasound probe cover (if used).  Procedure Description Area of catheter insertion was cleaned with chlorhexidine and draped in sterile fashion.  With real-time ultrasound guidance a central venous catheter was placed into the right internal jugular vein. Nonpulsatile blood flow and easy flushing noted in all ports.  The catheter was sutured in place and sterile dressing applied.  Complications/Tolerance None; patient tolerated the procedure well. Chest X-ray is ordered to verify placement for internal jugular or subclavian cannulation.   Chest x-ray is not ordered for femoral cannulation.  EBL Minimal  Specimen(s) None

## 2023-01-20 NOTE — Progress Notes (Signed)
RT pulled out ET tube from 25 to 24 cm per CCM order.

## 2023-01-20 NOTE — Progress Notes (Signed)
Initial Nutrition Assessment  INTERVENTION:   Monitor magnesium, potassium, and phosphorus for at least 3 days, MD to replete as needed, as pt is at risk for refeeding syndrome.  -Initiate Vital 1.5 @ 20 ml/hr, advance by 10 ml every 8 hours to goal rate of 55 ml/hr. -Provides 1980 kcals, 89g protein and 1008 ml H2O.  NUTRITION DIAGNOSIS:   Increased nutrient needs related to acute illness as evidenced by estimated needs.  GOAL:   Patient will meet greater than or equal to 90% of their needs  MONITOR:   Vent status, Labs, Weight trends, I & O's, TF tolerance  REASON FOR ASSESSMENT:   Consult, Ventilator Enteral/tube feeding initiation and management  ASSESSMENT:   52 y.o. female patient with mild asthma on Albuterol PRN, DM-2, HTN, dyslipidemia, hypothyroidism, GERD, and tobacco smoking (1 ppd for 30 yrs) who presented to ED with AMS, hypoxia 48% on RA, SOB, DOE, for dry cough for the last 2 days. Today he noted her to be confused and very somnolent while sitting on the couch with labored breathing.  Patient in room with provider at time of visit. Pt intubated last night, 12/22. Pt is currently influenza A+, with worsening ARDS.  Will place order for tube feeds via OGT. Has low K, Mg and Phos labs so will continue to monitor these and will advance tube feeds slowly.   Patient is currently intubated on ventilator support MV: 10.2 L/min Temp (24hrs), Avg:99.9 F (37.7 C), Min:96.5 F (35.8 C), Max:101.1 F (38.4 C)  Per weight records, pt has lost 12 lbs since 10/7 (7% wt loss x 2.5 months, significant for time frame).  Medications: Colace, Miralax,, KLOR-CON, Lactated ringers, IV Mg sulfate, K-Phos, Vasopressin, Levophed  Labs reviewed: CBGs: 89-123 Low K Low Phos  Low Mg    NUTRITION - FOCUSED PHYSICAL EXAM:  Unable to complete   Diet Order:   Diet Order             Diet NPO time specified  Diet effective now                   EDUCATION NEEDS:    Not appropriate for education at this time  Skin:  Skin Assessment: Reviewed RN Assessment  Last BM:  PTA  Height:   Ht Readings from Last 1 Encounters:  01/19/23 5' 4.02" (1.626 m)    Weight:   Wt Readings from Last 1 Encounters:  01/20/23 63.3 kg    BMI:  Body mass index is 23.94 kg/m.  Estimated Nutritional Needs:   Kcal:  1900-2100  Protein:  90-100g  Fluid:  2L/day   Sabrina Franco, MS, RD, LDN Inpatient Clinical Dietitian Contact via Secure chat

## 2023-01-21 ENCOUNTER — Inpatient Hospital Stay (HOSPITAL_COMMUNITY): Payer: Medicare Other

## 2023-01-21 DIAGNOSIS — J80 Acute respiratory distress syndrome: Secondary | ICD-10-CM | POA: Diagnosis not present

## 2023-01-21 DIAGNOSIS — A419 Sepsis, unspecified organism: Secondary | ICD-10-CM | POA: Diagnosis not present

## 2023-01-21 DIAGNOSIS — R6521 Severe sepsis with septic shock: Secondary | ICD-10-CM | POA: Diagnosis not present

## 2023-01-21 LAB — BASIC METABOLIC PANEL
Anion gap: 8 (ref 5–15)
Anion gap: 6 (ref 5–15)
BUN: 22 mg/dL — ABNORMAL HIGH (ref 6–20)
BUN: 22 mg/dL — ABNORMAL HIGH (ref 6–20)
CO2: 17 mmol/L — ABNORMAL LOW (ref 22–32)
CO2: 19 mmol/L — ABNORMAL LOW (ref 22–32)
Calcium: 7 mg/dL — ABNORMAL LOW (ref 8.9–10.3)
Calcium: 7 mg/dL — ABNORMAL LOW (ref 8.9–10.3)
Chloride: 111 mmol/L (ref 98–111)
Chloride: 109 mmol/L (ref 98–111)
Creatinine, Ser: 0.72 mg/dL (ref 0.44–1.00)
Creatinine, Ser: 0.74 mg/dL (ref 0.44–1.00)
GFR, Estimated: >60 mL/min (ref >60)
GFR, Estimated: >60 mL/min (ref >60)
Glucose, Bld: 134 mg/dL — ABNORMAL HIGH (ref 70–99)
Glucose, Bld: 148 mg/dL — ABNORMAL HIGH (ref 70–99)
Potassium: 4.7 mmol/L (ref 3.5–5.1)
Potassium: 3.9 mmol/L (ref 3.5–5.1)
Sodium: 136 mmol/L (ref 135–145)
Sodium: 134 mmol/L — ABNORMAL LOW (ref 135–145)

## 2023-01-21 LAB — CBC WITH DIFFERENTIAL/PLATELET
Abs Immature Granulocytes: 0.11 10*3/uL — ABNORMAL HIGH (ref 0.00–0.07)
Basophils Absolute: 0.1 10*3/uL (ref 0.0–0.1)
Basophils Relative: 1 %
Eosinophils Absolute: 0 10*3/uL (ref 0.0–0.5)
Eosinophils Relative: 0 %
HCT: 32.7 % — ABNORMAL LOW (ref 36.0–46.0)
Hemoglobin: 10.6 g/dL — ABNORMAL LOW (ref 12.0–15.0)
Immature Granulocytes: 1 %
Lymphocytes Relative: 6 %
Lymphs Abs: 0.5 10*3/uL — ABNORMAL LOW (ref 0.7–4.0)
MCH: 28.8 pg (ref 26.0–34.0)
MCHC: 32.4 g/dL (ref 30.0–36.0)
MCV: 88.9 fL (ref 80.0–100.0)
Monocytes Absolute: 0.3 10*3/uL (ref 0.1–1.0)
Monocytes Relative: 3 %
Neutro Abs: 6.8 10*3/uL (ref 1.7–7.7)
Neutrophils Relative %: 89 %
Platelets: 87 10*3/uL — ABNORMAL LOW (ref 150–400)
RBC: 3.68 MIL/uL — ABNORMAL LOW (ref 3.87–5.11)
RDW: 15.4 % (ref 11.5–15.5)
WBC: 7.8 10*3/uL (ref 4.0–10.5)
nRBC: 0 % (ref 0.0–0.2)

## 2023-01-21 LAB — GLUCOSE, CAPILLARY
Glucose-Capillary: 115 mg/dL — ABNORMAL HIGH (ref 70–99)
Glucose-Capillary: 117 mg/dL — ABNORMAL HIGH (ref 70–99)
Glucose-Capillary: 160 mg/dL — ABNORMAL HIGH (ref 70–99)
Glucose-Capillary: 120 mg/dL — ABNORMAL HIGH (ref 70–99)
Glucose-Capillary: 107 mg/dL — ABNORMAL HIGH (ref 70–99)

## 2023-01-21 LAB — STREP PNEUMONIAE URINARY ANTIGEN: Strep Pneumo Urinary Antigen: NEGATIVE

## 2023-01-21 LAB — PHOSPHORUS: Phosphorus: 2.7 mg/dL (ref 2.5–4.6)

## 2023-01-21 LAB — MAGNESIUM: Magnesium: 1.8 mg/dL (ref 1.7–2.4)

## 2023-01-21 LAB — LACTIC ACID, PLASMA: Lactic Acid, Venous: 2.6 mmol/L (ref 0.5–1.9)

## 2023-01-21 MED ORDER — VANCOMYCIN HCL 1000 MG IV SOLR
750.0000 mg | Freq: Two times a day (BID) | INTRAVENOUS | Status: DC
  Administered 2023-01-21: 750 mg via INTRAVENOUS
  Filled 2023-01-21 (×2): qty 15

## 2023-01-21 MED ORDER — SODIUM CHLORIDE 0.9% FLUSH
3.0000 mL | Freq: Two times a day (BID) | INTRAVENOUS | Status: DC
  Administered 2023-01-21 – 2023-01-29 (×12): 10 mL via INTRAVENOUS
  Administered 2023-01-30: 3 mL via INTRAVENOUS
  Administered 2023-01-30: 10 mL via INTRAVENOUS
  Administered 2023-01-31: 3 mL via INTRAVENOUS
  Administered 2023-01-31 – 2023-02-02 (×3): 10 mL via INTRAVENOUS
  Administered 2023-02-02: 3 mL via INTRAVENOUS
  Administered 2023-02-02: 10 mL via INTRAVENOUS
  Administered 2023-02-03: 3 mL via INTRAVENOUS
  Administered 2023-02-03 – 2023-02-05 (×3): 10 mL via INTRAVENOUS

## 2023-01-21 MED ORDER — FENTANYL CITRATE PF 50 MCG/ML IJ SOSY
50.0000 ug | PREFILLED_SYRINGE | INTRAMUSCULAR | Status: DC | PRN
Start: 2023-01-21 — End: 2023-01-23
  Administered 2023-01-21 (×2): 50 ug via INTRAVENOUS
  Administered 2023-01-21 (×2): 100 ug via INTRAVENOUS
  Administered 2023-01-21 – 2023-01-22 (×2): 50 ug via INTRAVENOUS
  Filled 2023-01-21 (×3): qty 1
  Filled 2023-01-21 (×3): qty 2

## 2023-01-21 MED ORDER — FENTANYL 2500MCG IN NS 250ML (10MCG/ML) PREMIX INFUSION
50.0000 ug/h | INTRAVENOUS | Status: DC
  Administered 2023-01-21: 50 ug/h via INTRAVENOUS
  Filled 2023-01-21: qty 250

## 2023-01-21 MED ORDER — FENTANYL CITRATE PF 50 MCG/ML IJ SOSY
50.0000 ug | PREFILLED_SYRINGE | INTRAMUSCULAR | Status: AC | PRN
  Administered 2023-01-21 (×3): 50 ug via INTRAVENOUS
  Filled 2023-01-21 (×2): qty 1

## 2023-01-21 MED ORDER — SODIUM CHLORIDE 0.9% FLUSH: 3.0000 mL | INTRAVENOUS | Status: DC | PRN

## 2023-01-21 MED ORDER — FENTANYL CITRATE PF 50 MCG/ML IJ SOSY
50.0000 ug | PREFILLED_SYRINGE | Freq: Once | INTRAMUSCULAR | Status: AC
  Administered 2023-01-21: 50 ug via INTRAVENOUS
  Filled 2023-01-21: qty 1

## 2023-01-21 MED ORDER — VANCOMYCIN HCL 750 MG IV SOLR
750.0000 mg | Freq: Two times a day (BID) | INTRAVENOUS | Status: DC
  Administered 2023-01-21 – 2023-01-26 (×10): 750 mg via INTRAVENOUS
  Filled 2023-01-21: qty 15
  Filled 2023-01-21: qty 750
  Filled 2023-01-21 (×7): qty 15
  Filled 2023-01-21: qty 750
  Filled 2023-01-21: qty 15

## 2023-01-21 MED ORDER — FENTANYL CITRATE PF 50 MCG/ML IJ SOSY
PREFILLED_SYRINGE | INTRAMUSCULAR | Status: AC
  Filled 2023-01-21: qty 1

## 2023-01-21 MED ORDER — SODIUM CHLORIDE 0.9 % IV SOLN: INTRAVENOUS | Status: DC | PRN

## 2023-01-21 MED ORDER — ATORVASTATIN CALCIUM 40 MG PO TABS
20.0000 mg | ORAL_TABLET | Freq: Every day | ORAL | Status: DC
  Administered 2023-01-21 – 2023-01-23 (×3): 20 mg via NASOGASTRIC
  Filled 2023-01-21 (×3): qty 1

## 2023-01-21 MED ORDER — LACTATED RINGERS IV SOLN: INTRAVENOUS | Status: DC

## 2023-01-21 MED ORDER — VANCOMYCIN HCL 750 MG/150ML IV SOLN
750.0000 mg | Freq: Two times a day (BID) | INTRAVENOUS | Status: DC
  Filled 2023-01-21: qty 150

## 2023-01-21 MED ORDER — CALCIUM GLUCONATE-NACL 1-0.675 GM/50ML-% IV SOLN
1.0000 g | Freq: Once | INTRAVENOUS | Status: AC
  Administered 2023-01-21: 1000 mg via INTRAVENOUS
  Filled 2023-01-21: qty 50

## 2023-01-21 MED ORDER — MAGNESIUM SULFATE 2 GM/50ML IV SOLN
2.0000 g | Freq: Once | INTRAVENOUS | Status: AC
Start: 2023-01-21 — End: 2023-01-21
  Administered 2023-01-21: 2 g via INTRAVENOUS
  Filled 2023-01-21: qty 50

## 2023-01-21 NOTE — Progress Notes (Signed)
Nursing staff reporting patient having multiple episodes of type 7 stool output. Patient did receive miralax over the last 24hrs and tube feedings were initiated 12/23. Will dc miralax and order for placement of flexiseal.

## 2023-01-21 NOTE — TOC Initial Note (Signed)
Transition of Care [facility]) - Initial/Assessment Note    Patient Details  Name: Sabrina Roberson MRN: [id number] Date of Birth: 1970-11-14  [facility]) CM/SW Contact:    Howell Rucks, RN Phone Number: 01/21/2023, 9:09 AM  Clinical Narrative:   Pt in ICU, sedated on vent. TOC will continue to follow pt's progress for for initial assessment and dc planning.                      Patient Goals and CMS Choice            Expected Discharge Plan and Services                                              Prior Living Arrangements/Services                       Activities of Daily Living      Permission Sought/Granted                  Emotional Assessment              Admission diagnosis:  Septic shock (HCC) [A41.9, R65.21] Patient Active Problem List   Diagnosis Date Noted   ARDS (adult respiratory distress syndrome) (HCC) 01/20/2023   Septic shock (HCC) 01/19/2023   Bilateral lower extremity edema 12/03/2022   Bilateral carotid bruits 11/04/2022   CKD (chronic kidney disease) stage 2, GFR 60-89 ml/min 11/04/2022   Mixed hyperlipidemia 10/10/2022   GAD (generalized anxiety disorder) 10/10/2022   Acquired hypothyroidism 10/10/2022   Hypertension    Type 2 diabetes mellitus with complications (HCC)    Primary insomnia 09/18/2022   Dysuria 09/18/2022   Abnormal CT of the chest 02/04/2014   DOE (dyspnea on exertion) 12/21/2013   Benzodiazepine dependence, episodic (HCC) 12/23/2011   DDD (degenerative disc disease), cervical 12/23/2011   DDD (degenerative disc disease), lumbosacral 12/23/2011   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 [place] [address] Phone: [phone] Fax: [phone]  [name] - [place], [place] - [address] [place] [address] Phone: [phone] Fax: [phone]     Social Drivers of Health (SDOH) Social History: SDOH Screenings   Food Insecurity: Patient Unable To Answer (01/19/2023)  Housing: Patient Unable To Answer (01/19/2023)  Transportation Needs: Patient Unable To Answer (01/19/2023)  Utilities: Not At Risk (01/19/2023)  Alcohol Screen: Low Risk  (10/10/2022)  Depression (PHQ2-9): Medium Risk (12/02/2022)  Financial Resource Strain: Low Risk  (12/02/2022)  Physical Activity: Unknown (12/02/2022)  Recent Concern: Physical Activity - Insufficiently Active (10/10/2022)  Social Connections: Unknown (12/02/2022)  Recent Concern: Social Connections - Moderately Isolated (10/10/2022)  Stress: Stress Concern Present (12/02/2022)  Tobacco Use: High Risk (01/19/2023)  Health Literacy: Adequate Health Literacy (10/10/2022)   SDOH Interventions:     Readmission Risk Interventions     No data to display

## 2023-01-21 NOTE — Progress Notes (Signed)
eLink Physician-Brief Progress Note Patient Name: Sabrina Roberson DOB: Mar 16, 1970 MRN: [id number]   Date of Service  01/21/2023  HPI/Events of Note  Shock on pressors, NPO and had LR order at 100 cc/hr which expired  eICU Interventions  Reordered at 75 cc/hr     Intervention Category Major Interventions: Shock - evaluation and management  Oretha Milch 01/21/2023, 2:31 AM

## 2023-01-21 NOTE — Progress Notes (Signed)
Pharmacy Antibiotic Note  Sabrina Roberson is a 52 y.o. female admitted on 01/19/2023 with sepsis, multi-focal pneumonia, influenza A, and colitis.  Pharmacy has been consulted for Vancomycin dosing.  01/21/2023 Day # 3 vancomycin & Tamiflu; Day # 2 azithromycin, ceftriaxone, Flagyl Neutropenia resolved ANC 0.8>5.8 SCr 1.11>> 0.72 Tm 100.2 Levo @ 21, vaso 0.03 Vent settings improving Cultures: positive Flu A, positive MRSA PCR, TA gram stain: WBC, yeast, GPC in clusters  Plan: Change vancomycin to 750 mg IV q12 for eAUC 427, SCr rounded up to 0.8, Vd 0.72, Css min 12.6 Ceftriaxone, azithromycin, tamiflu per MD Follow renal function F/u culture results & sensitivities  Height: 5' 4.02" (162.6 cm) Weight: 75.4 kg (166 lb 3.6 oz) IBW/kg (Calculated) : 54.74  Temp (24hrs), Avg:99.1 F (37.3 C), Min:96.8 F (36 C), Max:100.2 F (37.9 C)  Recent Labs  Lab 01/19/23 1708 01/19/23 2024 01/19/23 2328 01/20/23 0505 01/20/23 0850 01/20/23 1532 01/21/23 0355  WBC 1.0*  --   --  1.0*  --   --  7.8  CREATININE 1.11*  --   --  0.80  --  0.99 0.72  LATICACIDVEN 4.0* 3.1* 3.6*  --  2.2*  --   --     Estimated Creatinine Clearance: 83.7 mL/min (by C-G formula based on SCr of 0.72 mg/dL).    Allergies  Allergen Reactions   Latex Anaphylaxis and Swelling   Codeine Nausea And Vomiting    Other reaction(s): GI Upset (intolerance), Vomiting (intolerance)   Ibuprofen Other (See Comments)    GI upset, burning sensation   Oxycodone Nausea And Vomiting    Pre-medication with phenergan needed.   Tape Itching and Rash    Please use "paper" tape only.   Antimicrobials this admission: 12/22 Zosyn x 1  12/22 Vancomycin >>   12/22 Oseltamivir >> (12/27) 12/23 Ceftriaxone >> 12/23 Metronidazole >> 12/24 12/23 Azithromycin >>    Microbiology results: 12/22 Resp panel: +Influenza A 12/22 BCx:  ngtd 12/22 MRSA PCR:  detected 12/23 TA: few yeast; few GPC in clusters 12/23 strep pneumo  neg HIV NR  Thank you for allowing pharmacy to be a part of this patient's care.  Herby Abraham, Pharm.D Use secure chat for questions 01/21/2023 8:38 AM

## 2023-01-21 NOTE — Plan of Care (Signed)
Problem: Education: Goal: Knowledge of General Education information will improve Description: Including pain rating scale, medication(s)/side effects and non-pharmacologic comfort measures Outcome: Progressing   Problem: Health Behavior/Discharge Planning: Goal: Ability to manage health-related needs will improve Outcome: Progressing   Problem: Clinical Measurements: Goal: Ability to maintain clinical measurements within normal limits will improve Outcome: Progressing Goal: Will remain free from infection Outcome: Progressing Goal: Diagnostic test results will improve Outcome: Progressing Goal: Respiratory complications will improve Outcome: Progressing Goal: Cardiovascular complication will be avoided Outcome: Progressing   Problem: Activity: Goal: Risk for activity intolerance will decrease Outcome: Progressing   Problem: Nutrition: Goal: Adequate nutrition will be maintained Outcome: Progressing   Problem: Coping: Goal: Level of anxiety will decrease Outcome: Progressing   Problem: Elimination: Goal: Will not experience complications related to bowel motility Outcome: Progressing Goal: Will not experience complications related to urinary retention Outcome: Progressing   Problem: Pain Management: Goal: General experience of comfort will improve Outcome: Progressing   Problem: Safety: Goal: Ability to remain free from injury will improve Outcome: Progressing   Problem: Skin Integrity: Goal: Risk for impaired skin integrity will decrease Outcome: Progressing   Problem: Fluid Volume: Goal: Ability to maintain a balanced intake and output will improve Outcome: Progressing   Problem: Health Behavior/Discharge Planning: Goal: Ability to identify and utilize available resources and services will improve Outcome: Progressing Goal: Ability to manage health-related needs will improve Outcome: Progressing   Problem: Metabolic: Goal: Ability to maintain  appropriate glucose levels will improve Outcome: Progressing   Problem: Nutritional: Goal: Maintenance of adequate nutrition will improve Outcome: Progressing Goal: Progress toward achieving an optimal weight will improve Outcome: Progressing   Problem: Skin Integrity: Goal: Risk for impaired skin integrity will decrease Outcome: Progressing   Problem: Tissue Perfusion: Goal: Adequacy of tissue perfusion will improve Outcome: Progressing   Problem: Safety: Goal: Non-violent Restraint(s) Outcome: Progressing   Problem: Education: Goal: Ability to describe self-care measures that may prevent or decrease complications (Diabetes Survival Skills Education) will improve Outcome: Not Progressing   Problem: Coping: Goal: Ability to adjust to condition or change in health will improve Outcome: Progressing   Cindy S. Clelia Croft BSN, RN, [employer], CCRN 01/21/2023 5:26 AM

## 2023-01-21 NOTE — Progress Notes (Signed)
[facility] ADULT ICU REPLACEMENT PROTOCOL   The patient does apply for the [facility] Adult ICU Electrolyte Replacment Protocol based on the criteria listed below:   1.Exclusion criteria: TCTS, ECMO, Dialysis, and Myasthenia Gravis patients 2. Is GFR >/= 30 ml/min? Yes.    Patient's GFR today is >60 3. Is SCr </= 2? Yes.   Patient's SCr is 0.72 mg/dL 4. Did SCr increase >/= 0.5 in 24 hours? No. 5.Pt's weight >40kg  Yes.   6. Abnormal electrolyte(s):   Mg 1.8  7. Electrolytes replaced per protocol 8.  Call MD STAT for K+ </= 2.5, Phos </= 1, or Mag </= 1 Physician:  Ignacia Palma R Denham Mose 01/21/2023 5:51 AM

## 2023-01-21 NOTE — Progress Notes (Signed)
eLink Physician-Brief Progress Note Patient Name: Sabrina Roberson DOB: 05/10/1970 MRN: [id number]   Date of Service  01/21/2023  HPI/Events of Note  RN asking you to assess for sedation gtt, getting prn fentanyl quite often w/no change.  Discussed with RN. Got so far 100 mcg in this shift. Getting agitated needing frequent fenta pushes.   Camera: In synchrony with vent. VS stable on levophed at 19. Off of fenta drip from yesterday.  ARDS improving. Septic shock   eICU Interventions  Re start fentanyl drip for now.      Intervention Category Intermediate Interventions: Other: Minor Interventions: Agitation / anxiety - evaluation and management  Ranee Gosselin 01/21/2023, 10:29 PM

## 2023-01-21 NOTE — Progress Notes (Signed)
This nurse and Haven, RN wasted the remaining balance of Fentanyl IV bag in the med room together; total of 120 mL of fentanyl was wasted in the med room waste bin.

## 2023-01-21 NOTE — Progress Notes (Signed)
NAME:  Sabrina Roberson, MRN:  [id number], DOB:  [birth year], LOS: 2 ADMISSION DATE:  01/19/2023, CONSULTATION DATE: 01/19/2023 REFERRING MD: Royanne Foots, DO, CHIEF COMPLAINT: AMS and hypoxia today  History of Present Illness:  A 52 y.o. female patient with mild asthma on Albuterol PRN, DM-2, HTN, dyslipidemia, hypothyroidism, GERD, and tobacco smoking (1 ppd for 30 yrs) who presented to ED with AMS, hypoxia 48% on RA, SOB, DOE, for dry cough for the last 2 days. Today he noted her to be confused and very somnolent while sitting on the couch with labored breathing. Regarding sick contacts, her husband notes the patient was watching a grandchild with recent viral URI symptoms earlier this week. No F/c/r, N/V/D, abd pain, rash, CP, illicit drug use, or alcohol intake.    Pertinent  Medical History  Mild asthma on Albuterol PRN, DM-2, HTN, dyslipidemia, hypothyroidism, GERD  Significant Hospital Events: Including procedures, antibiotic start and stop dates in addition to other pertinent events   12/22 Admit with ARDS secondary to Influenza A PNA 12/23 Worsening ARDS, septic shock  12/24 Fio2 and PEEP requirements decreasing along with pressors requirements  Interim History / Subjective:  On no sedation, following commands, synchronous with vent   Objective   Blood pressure (!) 105/57, pulse 66, temperature 98.1 F (36.7 C), resp. rate (!) 24, height 5' 4.02" (1.626 m), weight 75.4 kg, SpO2 97%. CVP:  [4 mmHg-11 mmHg] 11 mmHg  Vent Mode: PRVC FiO2 (%):  [40 %-60 %] 40 % Set Rate:  [24 bmp] 24 bmp Vt Set:  [430 mL] 430 mL PEEP:  [8 cmH20-10 cmH20] 8 cmH20 Plateau Pressure:  [21 cmH20-22 cmH20] 21 cmH20   Intake/Output Summary (Last 24 hours) at 01/21/2023 0865 Last data filed at 01/21/2023 0600 Gross per 24 hour  Intake 6354.88 ml  Output 1200 ml  Net 5154.88 ml   Filed Weights   01/19/23 2018 01/20/23 0500 01/21/23 0422  Weight: 63.3 kg 63.3 kg 75.4 kg     Examination: General: acutely ill appearing middle aged female, lying on SDU bed  HENT: Normocephalic, PERRLA intact, Poor dentition, Pink MM, ETT, OG  Periorbital edema Lungs: Rhonchi, diminished throughout, no respiratory distress  Cardiovascular: s1, s2, RRR, No JVD, no MRG Abdomen: BS active, soft Extremities: non pitting edema  Neuro: RASS 0 to -1, follows commands GU: Intact, foley, perineal swelling decreasing   Resolved Hospital Problem list   N/a   Assessment & Plan:  Acute hypoxic hypercapnic resp failure due to viral PNA (influenza A) Severe ARDS Mild Asthma Hx  PF ratio improved to 206 Fio2 requirements decreasing- Fio2 40%, PEEP 8  Patient compliant with vent with no to minimal sedation, Plat < 30  12/24 BL infiltrates still present on CXR P:  Continue full vent support with lung protective strategies per ARDS protocol  Wean PEEP and FIO2 for O2 Sats > 88% Permissive hypercapnia, with pH goal 7.2 or better HOB > 30 degrees Plateau pressures less than 30 cm H20 as able  Continue to follow ABG and CXRs intermittently  May attempt SAT/SBT trials since vent requirements decreasing  Continue to follow cultures (Resp culture/BC pending)  Continue VAP and PAD protocol, RASS goal of 0 to -1, continue to use fentanyl gtt Continue Vanc, cetriaxone, and azithromycin  Continue Tamiflu   Septic shock in setting viral PNA  Leukopenia-resolving  Possible colitis seen on CT abdominal scan-low suspicion  MRSA PCR + Lactic acidosis clearing  Echo EF 50-55%, RV and LF  low normal function, no regional wall abnormalities  Blood Cultures no growth to date P: Continue vanc, cetriaxone, and azithromycin, dc flagyl  Continue patient on stress dose steroids MAP goal > 65, titrate levophed per aline  Continue vasopressin  Will dc LR fluids, continue to monitor strict I/Os Continue CVP q 4 Continue to follow up on Resp and Blood cultures    Thrombocytopenia in setting of  Septic shock  P: Continue to trend CBC daily  Continue to monitor signs of bleeding  Fluid Electrolyte imbalance  Mag low  P:  Elink replacing Mag Continue to follow daily BMETs  DM-2 CBGs stable  P: Continue CBG q 4 Continue SSI Continue tube feeds per RD recs, appreciate assistance   HTN P: Continue to hold antihypertensives   Dyslipidemia P: Restart statin per tube   Hypothyroidism P:  Restart levothyroxine when appropriate   GERD P:  Continue PPI daily   Tobacco smoking (30 PY)  P:  Educate cessation when appropriate and with family   Latex Allergy hx Latex foley removed Perineal swelling improving  P: Continue to use non-latex catheters And other non-latex items    Best Practice (right click and "Reselect all SmartList Selections" daily)   Diet/type: NPO/NGT for meds and TF DVT prophylaxis  heparin  Pressure ulcer(s): N/A GI prophylaxis: PPI Lines: CVC 12/23  Foley:  Yes, and it is still needed Code Status:  full code Last date of multidisciplinary goals of care discussion [husband and mother on 12/24]    Critical care time: 8   Christian Kramer Hanrahan AGACNP-BC   [facility] 01/21/2023, 7:02 AM  Please see [email or web address] for pager details.  From 7A-7P if no response, please call [phone]. After hours, please call ELink [phone].

## 2023-01-22 ENCOUNTER — Other Ambulatory Visit: Payer: Self-pay

## 2023-01-22 DIAGNOSIS — J80 Acute respiratory distress syndrome: Secondary | ICD-10-CM | POA: Diagnosis not present

## 2023-01-22 DIAGNOSIS — R6521 Severe sepsis with septic shock: Secondary | ICD-10-CM | POA: Diagnosis not present

## 2023-01-22 DIAGNOSIS — J111 Influenza due to unidentified influenza virus with other respiratory manifestations: Secondary | ICD-10-CM

## 2023-01-22 DIAGNOSIS — A419 Sepsis, unspecified organism: Secondary | ICD-10-CM | POA: Diagnosis not present

## 2023-01-22 LAB — CBC WITH DIFFERENTIAL/PLATELET
Abs Immature Granulocytes: 0.1 10*3/uL — ABNORMAL HIGH (ref 0.00–0.07)
Band Neutrophils: 9 %
Basophils Absolute: 0 10*3/uL (ref 0.0–0.1)
Basophils Relative: 0 %
Eosinophils Absolute: 0 10*3/uL (ref 0.0–0.5)
Eosinophils Relative: 0 %
HCT: 27 % — ABNORMAL LOW (ref 36.0–46.0)
Hemoglobin: 9.1 g/dL — ABNORMAL LOW (ref 12.0–15.0)
Lymphocytes Relative: 5 %
Lymphs Abs: 0.3 10*3/uL — ABNORMAL LOW (ref 0.7–4.0)
MCH: 29.4 pg (ref 26.0–34.0)
MCHC: 33.7 g/dL (ref 30.0–36.0)
MCV: 87.1 fL (ref 80.0–100.0)
Metamyelocytes Relative: 2 %
Monocytes Absolute: 0 10*3/uL — ABNORMAL LOW (ref 0.1–1.0)
Monocytes Relative: 0 %
Neutro Abs: 6.2 10*3/uL (ref 1.7–7.7)
Neutrophils Relative %: 84 %
Platelets: 34 10*3/uL — ABNORMAL LOW (ref 150–400)
RBC: 3.1 MIL/uL — ABNORMAL LOW (ref 3.87–5.11)
RDW: 15.3 % (ref 11.5–15.5)
WBC: 6.7 10*3/uL (ref 4.0–10.5)
nRBC: 0 % (ref 0.0–0.2)

## 2023-01-22 LAB — BASIC METABOLIC PANEL
Anion gap: 5 (ref 5–15)
BUN: 20 mg/dL (ref 6–20)
CO2: 20 mmol/L — ABNORMAL LOW (ref 22–32)
Calcium: 7.3 mg/dL — ABNORMAL LOW (ref 8.9–10.3)
Chloride: 108 mmol/L (ref 98–111)
Creatinine, Ser: 0.74 mg/dL (ref 0.44–1.00)
GFR, Estimated: >60 mL/min (ref >60)
Glucose, Bld: 161 mg/dL — ABNORMAL HIGH (ref 70–99)
Potassium: 3.5 mmol/L (ref 3.5–5.1)
Sodium: 133 mmol/L — ABNORMAL LOW (ref 135–145)

## 2023-01-22 LAB — GLUCOSE, CAPILLARY
Glucose-Capillary: 125 mg/dL — ABNORMAL HIGH (ref 70–99)
Glucose-Capillary: 128 mg/dL — ABNORMAL HIGH (ref 70–99)
Glucose-Capillary: 145 mg/dL — ABNORMAL HIGH (ref 70–99)
Glucose-Capillary: 149 mg/dL — ABNORMAL HIGH (ref 70–99)
Glucose-Capillary: 169 mg/dL — ABNORMAL HIGH (ref 70–99)
Glucose-Capillary: 178 mg/dL — ABNORMAL HIGH (ref 70–99)
Glucose-Capillary: 178 mg/dL — ABNORMAL HIGH (ref 70–99)

## 2023-01-22 LAB — PHOSPHORUS
Phosphorus: 1.4 mg/dL — ABNORMAL LOW (ref 2.5–4.6)
Phosphorus: 2.4 mg/dL — ABNORMAL LOW (ref 2.5–4.6)

## 2023-01-22 LAB — MAGNESIUM: Magnesium: 2.4 mg/dL (ref 1.7–2.4)

## 2023-01-22 MED ORDER — SODIUM CHLORIDE 0.9% FLUSH: 10.0000 mL | INTRAVENOUS | Status: DC | PRN

## 2023-01-22 MED ORDER — POTASSIUM PHOSPHATES 15 MMOLE/5ML IV SOLN
45.0000 mmol | Freq: Once | INTRAVENOUS | Status: AC
  Administered 2023-01-22: 45 mmol via INTRAVENOUS
  Filled 2023-01-22 (×2): qty 15

## 2023-01-22 MED ORDER — DEXMEDETOMIDINE HCL IN NACL 200 MCG/50ML IV SOLN
0.0000 ug/kg/h | INTRAVENOUS | Status: DC
Start: 2023-01-22 — End: 2023-01-22
  Administered 2023-01-22: 0.4 ug/kg/h via INTRAVENOUS
  Administered 2023-01-22 (×2): 0.5 ug/kg/h via INTRAVENOUS
  Filled 2023-01-22 (×3): qty 50

## 2023-01-22 MED ORDER — POTASSIUM CHLORIDE 20 MEQ PO PACK
40.0000 meq | PACK | Freq: Once | ORAL | Status: AC
  Administered 2023-01-22: 40 meq
  Filled 2023-01-22: qty 2

## 2023-01-22 MED ORDER — FENTANYL CITRATE PF 50 MCG/ML IJ SOSY: 50.0000 ug | PREFILLED_SYRINGE | Freq: Once | INTRAMUSCULAR | Status: AC

## 2023-01-22 MED ORDER — FUROSEMIDE 10 MG/ML IJ SOLN
40.0000 mg | Freq: Once | INTRAMUSCULAR | Status: AC
  Administered 2023-01-22: 40 mg via INTRAVENOUS
  Filled 2023-01-22: qty 4

## 2023-01-22 MED ORDER — FENTANYL BOLUS VIA INFUSION
50.0000 ug | INTRAVENOUS | Status: DC | PRN
  Administered 2023-01-22: 50 ug via INTRAVENOUS
  Administered 2023-01-22: 100 ug via INTRAVENOUS
  Administered 2023-01-22 (×2): 50 ug via INTRAVENOUS
  Administered 2023-01-22: 100 ug via INTRAVENOUS
  Administered 2023-01-22 (×2): 50 ug via INTRAVENOUS

## 2023-01-22 MED ORDER — SODIUM CHLORIDE 0.9% FLUSH
10.0000 mL | Freq: Two times a day (BID) | INTRAVENOUS | Status: DC
  Administered 2023-01-22 – 2023-01-23 (×4): 10 mL
  Administered 2023-01-24: 20 mL
  Administered 2023-01-25: 30 mL
  Administered 2023-01-25 – 2023-01-28 (×7): 10 mL
  Administered 2023-01-29: 20 mL
  Administered 2023-01-29 – 2023-01-31 (×4): 10 mL

## 2023-01-22 MED ORDER — POTASSIUM CHLORIDE CRYS ER 20 MEQ PO TBCR: 40.0000 meq | EXTENDED_RELEASE_TABLET | Freq: Once | ORAL | Status: DC

## 2023-01-22 NOTE — Progress Notes (Signed)
   01/22/23 0810  Vent Select  Invasive or Noninvasive Invasive  Adult Vent Y  Airway 7.5 mm  Placement Date/Time: 01/19/23 1800   Placed By: ED Physician  Airway Device: Endotracheal Tube  Laryngoscope Blade: 3  ETT Types: Subglottic;Oral  Size (mm): 7.5 mm  Cuffed: Cuffed  Insertion attempts: 1  Airway Equipment: Stylet;Video Laryngoscope  P...  Secured at (cm) 23 cm  Measured From Lips  Secured Location Center  Adult Ventilator Settings  Vent Mode (S)  PSV;CPAP  FiO2 (%) 40 %  Pressure Support (S)  5 cmH20 (Weaned to 5)  PEEP 5 cmH20  Adult Ventilator Measurements  Peak Airway Pressure 16 L/min  Mean Airway Pressure 7 cmH20  Resp Rate Spontaneous 17 br/min  Resp Rate Total 17 br/min  Spont TV 406 mL  Measured Ve 6.4 L  Total PEEP 5 cmH20  SpO2 92 %  Adult Ventilator Alarms  Alarms On Y  Ve High Alarm 25 L/min  Ve Low Alarm 5 L/min  Resp Rate High Alarm 35 br/min  Resp Rate Low Alarm 10  PEEP Low Alarm 3 cmH2O  Press High Alarm 45 cmH2O  T Apnea 20 sec(s)  VAP Prevention  HOB> 30 Degrees Y  Breath Sounds  Bilateral Breath Sounds Clear;Diminished  Vent Respiratory Assessment  Level of Consciousness Alert  Respiratory Pattern Regular;Unlabored  Oral Suctioning/Secretions  Suction Type  (Not indicated at this time.)

## 2023-01-22 NOTE — Progress Notes (Signed)
   01/22/23 0919  Adult Ventilator Settings  Vent Mode (S)  PRVC (Changed to [facility] due to fatigue, increased WOB, Sp02 decreased to 88% during last few minutes of wean.  Pt completed 1 hr on PSV 5/5, increased to 50% FI02.)

## 2023-01-22 NOTE — Progress Notes (Signed)
[facility] ADULT ICU REPLACEMENT PROTOCOL   The patient does apply for the [facility] Adult ICU Electrolyte Replacment Protocol based on the criteria listed below:   1.Exclusion criteria: TCTS, ECMO, Dialysis, and Myasthenia Gravis patients 2. Is GFR >/= 30 ml/min? Yes.    Patient's GFR today is >60 3. Is SCr </= 2? Yes.   Patient's SCr is 0.74 mg/dL 4. Did SCr increase >/= 0.5 in 24 hours? No. 5.Pt's weight >40kg  Yes.   6. Abnormal electrolyte(s):   K 3.5, Phos 1.4  7. Electrolytes replaced per protocol 8.  Call MD STAT for K+ </= 2.5, Phos </= 1, or Mag </= 1 Physician:  Mikeal Hawthorne R Siearra Amberg 01/22/2023 5:38 AM

## 2023-01-22 NOTE — Progress Notes (Signed)
NAME:  Sabrina Roberson, MRN:  [id number], DOB:  24-May-1970, LOS: 3 ADMISSION DATE:  01/19/2023, CONSULTATION DATE: 01/19/2023 REFERRING MD: Royanne Foots, DO, CHIEF COMPLAINT: AMS and hypoxia today  History of Present Illness:  A 52 y.o. female patient with mild asthma on Albuterol PRN, DM-2, HTN, dyslipidemia, hypothyroidism, GERD, and tobacco smoking (1 ppd for 30 yrs) who presented to ED with AMS, hypoxia 48% on RA, SOB, DOE, for dry cough for the last 2 days. Today he noted her to be confused and very somnolent while sitting on the couch with labored breathing. Regarding sick contacts, her husband notes the patient was watching a grandchild with recent viral URI symptoms earlier this week. No F/c/r, N/V/D, abd pain, rash, CP, illicit drug use, or alcohol intake.    Pertinent  Medical History  Mild asthma on Albuterol PRN, DM-2, HTN, dyslipidemia, hypothyroidism, GERD  Significant Hospital Events: Including procedures, antibiotic start and stop dates in addition to other pertinent events   12/22 Admit with ARDS secondary to Influenza A PNA 12/23 Worsening ARDS, septic shock  12/24 Fio2 and PEEP requirements decreasing along with pressors requirements  Interim History / Subjective:   Critically ill, improving On 40%/PEEP of 5 On lower Levophed doses, vasopressin turned off yesterday Good urine output but positive balance 10 L Afebrile  Objective   Blood pressure (!) 92/48, pulse 74, temperature 98.2 F (36.8 C), resp. rate (!) 24, height 5\' 4"  (1.626 m), weight 78.4 kg, SpO2 92%. CVP:  [11 mmHg] 11 mmHg  Vent Mode: PSV;CPAP FiO2 (%):  [40 %] 40 % Set Rate:  [24 bmp] 24 bmp Vt Set:  [430 mL] 430 mL PEEP:  [5 cmH20] 5 cmH20 Pressure Support:  [5 cmH20-10 cmH20] 5 cmH20 Plateau Pressure:  [13 cmH20-20 cmH20] 18 cmH20   Intake/Output Summary (Last 24 hours) at 01/22/2023 0825 Last data filed at 01/22/2023 5643 Gross per 24 hour  Intake 2658.05 ml  Output 1210 ml  Net  1448.05 ml   Filed Weights   01/20/23 0500 01/21/23 0422 01/22/23 0328  Weight: 63.3 kg 75.4 kg 78.4 kg    Examination: General: acutely ill appearing middle aged female, lying supine HENT: Normocephalic, PERRLA intact, Poor dentition, Pink MM, ETT, OG  Periorbital edema Lungs: Lateral ventilated breath sounds, no accessory muscle use Cardiovascular: s1, s2, RRR, No JVD, no MRG Abdomen: BS active, soft Extremities: 1+ non pitting edema  Neuro: RASS 0 to -1, follows commands GU: Intact, foley, perineal swelling decreasing   Labs show mild hyponatremia, hypokalemia and hypophosphatemia, no leukocytosis, drop in hemoglobin to 9.1  Chest x-ray shows bilateral consolidation/patchy airspace disease with may be small left effusion  Resolved Hospital Problem list   N/a   Assessment & Plan:   Severe ARDS  due to viral PNA (influenza A) Mild Asthma Hx  PF ratio improved  P:  Continue lung protective ventilation Wean PEEP and FIO2 for O2 Sats > 88% Permissive hypercapnia, with pH goal 7.2 or better HOB > 30 degrees Plateau pressures less than 30 cm H20 as able  Weaning on pressure support 10/5 decrease further as tolerated Continue VAP and PAD protocol, RASS goal of 0 to -1, adding Precedex and can discontinue fentanyl drip Continue Tamiflu  Will give Lasix x 1 in anticipation of weaning, goal CVP 4 once off pressors  Septic shock  Staph pneumonia superimposed on influenza  Possible colitis seen on CT abdominal scan-low suspicion  Echo EF 50-55%, RV and LF low normal function,  no regional wall abnormalities   P: Continue vanc, cetriaxone, dc azithromycin, dc flagyl  Continue patient on stress dose steroids until off pressors MAP goal > 65, titrate levophed per aline  Off vasopressin    Leukopenia resolved Thrombocytopenia in setting of Septic shock  P: Continue to trend CBC daily  Continue to monitor signs of bleeding Hold subcu heparin  Hypokalemia,  hypophosphatemia, hypomagnesemia P:  Replete  DM-2 CBGs stable  P: Continue CBG q 4 Continue SSI Continue tube feeds per RD recs  HTN P: Continue to hold antihypertensives   Dyslipidemia P: Restart statin per tube   Hypothyroidism P:  Restart levothyroxine when appropriate   GERD P:  Continue PPI daily   Tobacco smoking (30 PY)  P:  Educate cessation when appropriate and with family   Latex Allergy hx Latex foley removed Perineal swelling improving  P: Continue to use non-latex catheters And other non-latex items    Best Practice (right click and "Reselect all SmartList Selections" daily)   Diet/type:  TF DVT prophylaxis  heparin on hold, SCDs Pressure ulcer(s): N/A GI prophylaxis: PPI Lines: CVC 12/23  Foley:  Yes, and it is still needed Code Status:  full code Last date of multidisciplinary goals of care discussion [husband and mother on 12/24]    Critical care time: 30     Cyril Mourning MD. Tonny Bollman. [place] [facility] Pager : [phone]  If no response to pager , please call [phone] until 7 pm After 7:00 pm call Elink  [phone]    01/22/2023, 8:25 AM

## 2023-01-22 NOTE — Progress Notes (Signed)
   01/22/23 0751  Vent Select  Invasive or Noninvasive Invasive  Adult Vent Y  Airway 7.5 mm  Placement Date/Time: 01/19/23 1800   Placed By: ED Physician  Airway Device: Endotracheal Tube  Laryngoscope Blade: 3  ETT Types: Subglottic;Oral  Size (mm): 7.5 mm  Cuffed: Cuffed  Insertion attempts: 1  Airway Equipment: Stylet;Video Laryngoscope  P...  Secured at (cm) 23 cm  Measured From Lips  Secured Location Center  Secured By English as a second language teacher No  Tube Holder Repositioned Yes  Prone position No  Head position Right  Cuff Pressure (cm H2O) Green OR 18-26 CmH2O  Adult IBW/VT Calculations  Height 5\' 4"  (1.626 m)  IBW/kg (Calculated) Female 59.2 kg  Low Range Vt 6cc/kg FEMALE 355.2 mL  Adult Moderate Range Vt 8cc/kg MA 473.6 mL  Adult High Range Vt 10cc/kg FEMALE 592 mL  IBW/kg (Calculated) FEMALE 54.7 kg  Low Range Vt 6cc/kg FEMALE 328.2 mL  Adult Moderate Range vt 8cc/kg FEMALE 437.6 mL  Adult High Range Vt 10cc/kg FEMALE 547 mL  Adult Ventilator Settings  Vent Type Servo i  Humidity HME  Vent Mode (S)  PSV;CPAP  FiO2 (%) 40 %  Pressure Support (S)  10 cmH20  PEEP (S)  5 cmH20  Adult Ventilator Measurements  Peak Airway Pressure 16 L/min  Mean Airway Pressure 7 cmH20  Plateau Pressure 18 cmH20  Resp Rate Spontaneous 17 br/min  Resp Rate Total 17 br/min  Spont TV 406 mL  Measured Ve 6.4 L  Total PEEP 5 cmH20  SpO2 94 %  Adult Ventilator Alarms  Alarms On Y  Ve High Alarm 25 L/min  Ve Low Alarm 5 L/min  Resp Rate High Alarm 35 br/min  Resp Rate Low Alarm 10  PEEP Low Alarm 3 cmH2O  Press High Alarm 45 cmH2O  T Apnea 20 sec(s)  VAP Prevention  HOB> 30 Degrees Y  Breath Sounds  Bilateral Breath Sounds Clear;Diminished  Vent Respiratory Assessment  Level of Consciousness Alert  Respiratory Pattern Regular;Unlabored  Patient Tolerance Tolerated well  Suction Method  Respiratory Interventions Airway suction;Oral suction  Oral Suctioning/Secretions   Suction Type Oral  Suction Device Yankauer  Secretion Amount None  Suction Tolerance Tolerated well  Suctioning Adverse Effects None  Airway Suctioning/Secretions  Suction Type ETT  Suction Device  Catheter  Secretion Amount None  Suction Tolerance Tolerated well  Suctioning Adverse Effects None

## 2023-01-22 NOTE — Progress Notes (Signed)
   01/22/23 1542  Vent Select  Invasive or Noninvasive Invasive  Adult Vent Y  Airway 7.5 mm  Placement Date/Time: 01/19/23 1800   Placed By: ED Physician  Airway Device: Endotracheal Tube  Laryngoscope Blade: 3  ETT Types: Subglottic;Oral  Size (mm): 7.5 mm  Cuffed: Cuffed  Insertion attempts: 1  Airway Equipment: Stylet;Video Laryngoscope  P...  Secured at (cm) 23 cm  Measured From Lips  Secured Location Center  Secured By English as a second language teacher No  Prone position No  Head position Right  Cuff Pressure (cm H2O) Green OR 18-26 CmH2O  Site Condition Dry  Adult Ventilator Settings  Vent Type Servo i  Humidity HME  Vent Mode (S)  CPAP;PSV  FiO2 (%) 40 %  Pressure Support (S)  10 cmH20  PEEP (S)  5 cmH20  Adult Ventilator Measurements  Peak Airway Pressure 16 L/min  Mean Airway Pressure 8 cmH20  Resp Rate Spontaneous 29 br/min  Resp Rate Total 29 br/min  Spont TV 445 mL  Measured Ve 13.4 L  Total PEEP 5 cmH20  SpO2 95 %  Adult Ventilator Alarms  Alarms On Y  Ve High Alarm 25 L/min  Ve Low Alarm 5 L/min  Resp Rate High Alarm 35 br/min  Resp Rate Low Alarm 10  PEEP Low Alarm 3 cmH2O  Press High Alarm 45 cmH2O  T Apnea 20 sec(s)  VAP Prevention  HOB> 30 Degrees Y  Breath Sounds  Bilateral Breath Sounds Diminished;Fine crackles  Vent Respiratory Assessment  Level of Consciousness Responds to Voice  Respiratory Pattern Regular;Unlabored  Patient Tolerance Tolerated well  Suction Method  Respiratory Interventions  (No suctioning indicated at this time, scant amts earlier.)

## 2023-01-22 NOTE — Plan of Care (Signed)
  Problem: Clinical Measurements: Goal: Respiratory complications will improve Outcome: Progressing Goal: Cardiovascular complication will be avoided Outcome: Progressing   Problem: Nutrition: Goal: Adequate nutrition will be maintained Outcome: Progressing   Problem: Pain Management: Goal: General experience of comfort will improve Outcome: Progressing   Problem: Skin Integrity: Goal: Risk for impaired skin integrity will decrease Outcome: Progressing   Problem: Activity: Goal: Risk for activity intolerance will decrease Outcome: Not Progressing   Problem: Coping: Goal: Level of anxiety will decrease Outcome: Not Progressing   Problem: Safety: Goal: Ability to remain free from injury will improve Outcome: Not Progressing   Problem: Safety: Goal: Non-violent Restraint(s) Outcome: Not Progressing

## 2023-01-22 NOTE — Plan of Care (Signed)
?  Problem: Clinical Measurements: ?Goal: Ability to maintain clinical measurements within normal limits will improve ?Outcome: Progressing ?Goal: Will remain free from infection ?Outcome: Progressing ?Goal: Diagnostic test results will improve ?Outcome: Progressing ?  ?

## 2023-01-23 ENCOUNTER — Inpatient Hospital Stay (HOSPITAL_COMMUNITY): Payer: Medicare Other

## 2023-01-23 ENCOUNTER — Encounter: Payer: Medicare Other | Admitting: Vascular Surgery

## 2023-01-23 DIAGNOSIS — R6521 Severe sepsis with septic shock: Secondary | ICD-10-CM | POA: Diagnosis not present

## 2023-01-23 DIAGNOSIS — A419 Sepsis, unspecified organism: Secondary | ICD-10-CM | POA: Diagnosis not present

## 2023-01-23 DIAGNOSIS — J80 Acute respiratory distress syndrome: Secondary | ICD-10-CM | POA: Diagnosis not present

## 2023-01-23 LAB — CBC WITH DIFFERENTIAL/PLATELET
Abs Immature Granulocytes: 0.28 10*3/uL — ABNORMAL HIGH (ref 0.00–0.07)
Basophils Absolute: 0.1 10*3/uL (ref 0.0–0.1)
Basophils Relative: 1 %
Eosinophils Absolute: 0 10*3/uL (ref 0.0–0.5)
Eosinophils Relative: 0 %
HCT: 29.7 % — ABNORMAL LOW (ref 36.0–46.0)
Hemoglobin: 9.5 g/dL — ABNORMAL LOW (ref 12.0–15.0)
Immature Granulocytes: 3 %
Lymphocytes Relative: 7 %
Lymphs Abs: 0.6 10*3/uL — ABNORMAL LOW (ref 0.7–4.0)
MCH: 28.5 pg (ref 26.0–34.0)
MCHC: 32 g/dL (ref 30.0–36.0)
MCV: 89.2 fL (ref 80.0–100.0)
Monocytes Absolute: 0.3 10*3/uL (ref 0.1–1.0)
Monocytes Relative: 3 %
Neutro Abs: 8.2 10*3/uL — ABNORMAL HIGH (ref 1.7–7.7)
Neutrophils Relative %: 86 %
Platelets: 23 10*3/uL — CL (ref 150–400)
RBC: 3.33 MIL/uL — ABNORMAL LOW (ref 3.87–5.11)
RDW: 15.4 % (ref 11.5–15.5)
WBC: 9.4 10*3/uL (ref 4.0–10.5)
nRBC: 0 % (ref 0.0–0.2)

## 2023-01-23 LAB — BASIC METABOLIC PANEL
Anion gap: 5 (ref 5–15)
BUN: 18 mg/dL (ref 6–20)
CO2: 24 mmol/L (ref 22–32)
Calcium: 7.2 mg/dL — ABNORMAL LOW (ref 8.9–10.3)
Chloride: 106 mmol/L (ref 98–111)
Creatinine, Ser: 0.82 mg/dL (ref 0.44–1.00)
GFR, Estimated: >60 mL/min (ref >60)
Glucose, Bld: 170 mg/dL — ABNORMAL HIGH (ref 70–99)
Potassium: 3.9 mmol/L (ref 3.5–5.1)
Sodium: 135 mmol/L (ref 135–145)

## 2023-01-23 LAB — CULTURE, RESPIRATORY W GRAM STAIN

## 2023-01-23 LAB — GLUCOSE, CAPILLARY
Glucose-Capillary: 177 mg/dL — ABNORMAL HIGH (ref 70–99)
Glucose-Capillary: 152 mg/dL — ABNORMAL HIGH (ref 70–99)
Glucose-Capillary: 173 mg/dL — ABNORMAL HIGH (ref 70–99)
Glucose-Capillary: 175 mg/dL — ABNORMAL HIGH (ref 70–99)
Glucose-Capillary: 113 mg/dL — ABNORMAL HIGH (ref 70–99)

## 2023-01-23 LAB — MAGNESIUM: Magnesium: 2.2 mg/dL (ref 1.7–2.4)

## 2023-01-23 LAB — PHOSPHORUS: Phosphorus: 2.2 mg/dL — ABNORMAL LOW (ref 2.5–4.6)

## 2023-01-23 MED ORDER — FUROSEMIDE 10 MG/ML IJ SOLN
40.0000 mg | Freq: Two times a day (BID) | INTRAMUSCULAR | Status: AC
  Administered 2023-01-23 (×2): 40 mg via INTRAVENOUS
  Filled 2023-01-23 (×2): qty 4

## 2023-01-23 MED ORDER — SODIUM PHOSPHATES 45 MMOLE/15ML IV SOLN
15.0000 mmol | Freq: Once | INTRAVENOUS | Status: AC
  Administered 2023-01-23: 15 mmol via INTRAVENOUS
  Filled 2023-01-23: qty 5

## 2023-01-23 NOTE — Progress Notes (Addendum)
NAME:  Sabrina Roberson, MRN:  [id number], DOB:  16-Feb-1970, LOS: 4 ADMISSION DATE:  01/19/2023, CONSULTATION DATE: 01/19/2023 REFERRING MD: Royanne Foots, DO, CHIEF COMPLAINT: AMS and hypoxia today  History of Present Illness:  A 53 y.o. female patient with mild asthma on Albuterol PRN, DM-2, HTN, dyslipidemia, hypothyroidism, GERD, and tobacco smoking (1 ppd for 30 yrs) who presented to ED with AMS, hypoxia 48% on RA, SOB, DOE, for dry cough for the last 2 days. Today he noted her to be confused and very somnolent while sitting on the couch with labored breathing. Regarding sick contacts, her husband notes the patient was watching a grandchild with recent viral URI symptoms earlier this week. No F/c/r, N/V/D, abd pain, rash, CP, illicit drug use, or alcohol intake.    Pertinent  Medical History  Mild asthma on Albuterol PRN, DM-2, HTN, dyslipidemia, hypothyroidism, GERD  Significant Hospital Events: Including procedures, antibiotic start and stop dates in addition to other pertinent events   12/22 Admit with ARDS secondary to Influenza A PNA 12/23 Worsening ARDS, septic shock  12/24 Fio2 and PEEP requirements decreasing along with pressors requirements, flagyl dc'd  12/25 MRSA in tracheal aspirate, on low dose pressors, azithromycin dc'd  12/26 Weaning trials, continues on low dose pressors, continue cetriaxone/vanc   Interim History / Subjective:   Follow commands, on vent   Objective   Blood pressure (!) 93/55, pulse 65, temperature 99.3 F (37.4 C), resp. rate 10, height 5\' 4"  (1.626 m), weight 76.5 kg, SpO2 95%.    Vent Mode: PRVC FiO2 (%):  [40 %-50 %] 40 % Set Rate:  [20 bmp-24 bmp] 20 bmp Vt Set:  [430 mL] 430 mL PEEP:  [5 cmH20] 5 cmH20 Pressure Support:  [5 cmH20-10 cmH20] 10 cmH20 Plateau Pressure:  [18 cmH20-28 cmH20] 18 cmH20   Intake/Output Summary (Last 24 hours) at 01/23/2023 0656 Last data filed at 01/23/2023 0600 Gross per 24 hour  Intake 2632.25 ml   Output 3168 ml  Net -535.75 ml   Filed Weights   01/21/23 0422 01/22/23 0328 01/23/23 0219  Weight: 75.4 kg 78.4 kg 76.5 kg    Examination: General: acutely ill adult female, lying in ICU bed on vent  HENT: Normocephalic, Poor dentition, Pink MM, ETT, OG  Lungs: Rhonchi, diminished throughout, no respiratory distress Cardiovascular: s1,s2, RRR, no JVD, no MRG  Abdomen: BS active, soft, flexiseal  Extremities: moves all extremities on commands, 1 + edema  Neuro: RASS 0 to -1, follows commands GU: Foley, amber    Resolved Hospital Problem list   N/a   Assessment & Plan:   Severe ARDS  due to viral PNA (influenza A) Mild Asthma Hx  PF ratio improved On 40%, 5 of PEEP  12/26 Chest X-ray - BL pulmonary infiltrates, possible small pleural effusions  P:  Continue lung protective ventilation  O2 Sats goal > 88%, HOB > 30 degrees, plateu pressures < 30  Continue WUA/SBT as tolerated Continue VAP and PAD protocol, RASS goal of 0 to -1 Precedex dc'd due to rhythm issues, continue low dose fentanyl gtt Continue Tamiflu Continue Vanc and Cetriaxone  Will give lasix 40 mg x 2, Continue to monitor CVP q 4  Hold tube feeds during weaning trial for potential extubation   Septic shock  Staph pneumonia superimposed on influenza Possible colitis seen on CT abdominal scan-low suspicion  Echo EF 50-55%, RV and LF low normal function, no regional wall abnormalities  Vasopressin stopped 12/24  P: Continue vanc,  cetriaxone Continue patient on stress dose pressors, will dc once off pressors  Continue MAP goal > 65, titrate levophed per aline, goal to wean off if tolerates   Leukopenia resolved Thrombocytopenia in setting of Septic shock  No signs of bleeding P: Continue to trend CBC daily, monitor for signs of bleeding Transfuse if hgb < 7  Continue to hold subcutan. heparin   Fluid Electrolyte imbalance  Hypophosphatemia  Hypocalcemic  P:  Replacement orders per elink   Continue to trend BMET daily   DM-2 CBGs stable  P: Continue CBG q 4 Continue SSI Hold tube feeds see above   HTN P: Continue to hold antihypertensives  Dyslipidemia P: Continue Statin per tube   Hypothyroidism P:  Restart levothyroxine when appropriate   GERD P:  Continue PPI daily   Tobacco smoking (30 PY)  P:  Continue to Educate cessation when appropriate with patient And family   Latex Allergy hx Latex foley removed Perineal swelling improving  P: Continue to use non-latex catheters and products     Best Practice (right click and "Reselect all SmartList Selections" daily)   Diet/type:  TF DVT prophylaxis  heparin on hold, SCDs Pressure ulcer(s): N/A GI prophylaxis: PPI Lines: CVC 12/23  Foley:  Yes, and it is still needed Code Status:  full code Last date of multidisciplinary goals of care discussion updated husband at beside 12/26    Critical care time: 40    Christian Smith AGACNP-BC    Pulmonary & Critical Care 01/23/2023, 8:05 AM  Total critical care time: 40 minutes  Critical care time was exclusive of separately billable procedures and treating other patients.  Critical care was necessary to treat or prevent imminent or life-threatening deterioration.  Critical care was time spent personally by me on the following activities: development of treatment plan with patient and/or surrogate as well as nursing, discussions with consultants, evaluation of patient's response to treatment, examination of patient, obtaining history from patient or surrogate, ordering and performing treatments and interventions, ordering and review of laboratory studies, ordering and review of radiographic studies, pulse oximetry and re-evaluation of patient's condition.   Please see [email or web address] for pager details.  From 7A-7P if no response, please call [phone]. After hours, please call ELink [phone].     I personally saw the patient and  performed a substantive portion of this encounter, including a complete performance of at least one of the key components (MDM, Hx and/or Exam), in conjunction with the Advanced Practice Provider.   52 year old smoker admitted with influenza A and MRSA pneumonia, ARDS, septic  shock   Critically ill but improved over the last 2 days On lower doses of Levophed, diuresed well with Lasix yesterday but remains +9.5 L. 12/25 weaned well on pressure support, junctional rhythm with Precedex   On exam -1+ edema, follows commands on fentanyl drip, RASS 0 to +1, clear breath sounds bilateral, few crackles on right, S1-S2 regular, soft nontender abdomen  Labs show  no leukocytosis, worsening thrombocytopenia, mild hypophosphatemia Chest x-ray shows persistent bilateral infiltrates, some improvement in left lower lobe consolidation and effusions  Impression/plan ARDS -improving, start spontaneous breathing trials with goal extubation. Lasix 40 x 1 repeat today.   Septic shock -holding off Zyvox due to thrombocytopenia, continue vancomycin Titrating Levophed down, stress dose steroids can discontinue once off pressors.  Hypophosphatemia will be repleted.  Husband updated at bedside My independent critical care time was 35 minutes  Cierrah Dace V. Vassie Loll MD

## 2023-01-23 NOTE — Progress Notes (Signed)
   01/23/23 1105  Vent Select  Invasive or Noninvasive Noninvasive  Vent end date 01/23/23  Vent end time 1105  Adult Ventilator Settings  Vent Type (S)  Servo i  Humidity HME  Vent Mode (S)  BIPAP  Set Rate (S)  15 bmp  IPAP (S)  10 cmH20  EPAP (S)  5 cmH20  Adult Ventilator Measurements  Peak Airway Pressure 18 L/min  Mean Airway Pressure 8 cmH20  Resp Rate Spontaneous 20 br/min  Resp Rate Total 20 br/min  Spont TV 628 mL  Measured Ve 11.6 L  I:E Ratio Measured 1:2.4  Total PEEP 5 cmH20  SpO2 92 %  Adult Ventilator Alarms  Alarms On Y  Ve High Alarm 26 L/min  Ve Low Alarm 5 L/min  Resp Rate High Alarm 36 br/min  Resp Rate Low Alarm 8  PEEP Low Alarm 3 cmH2O  Press High Alarm 35 cmH2O  T Apnea 20 sec(s)  VAP Prevention  HOB> 30 Degrees Y  Breath Sounds  Bilateral Breath Sounds Diminished;Fine crackles  Vent Respiratory Assessment  Level of Consciousness Alert  Respiratory Pattern Regular;Unlabored  Patient Tolerance Tolerated well  Suction Method  Respiratory Interventions Airway suction;Oral suction (ETT suctioned by RN prior to extubation, orally after extubation.)

## 2023-01-23 NOTE — Progress Notes (Signed)
   01/23/23 1736  Vent Select  Invasive or Noninvasive Noninvasive  Adult Vent Y  Adult Ventilator Settings  Vent Type Servo i  Humidity HME  Vent Mode PCV;BIPAP  Set Rate 16 bmp  FiO2 (%) (S)  60 % (Increased to 60%.)  Pressure Control (S)  10 cmH20  PEEP (S)  5 cmH20  Adult Ventilator Measurements  Peak Airway Pressure 14 L/min  Resp Rate Spontaneous 36 br/min  Resp Rate Total 36 br/min  Spont TV 351 mL  Measured Ve 16.4 L  I:E Ratio Measured 1:1.1  Total PEEP 5 cmH20  SpO2 93 %  Adult Ventilator Alarms  Alarms On Y  Ve High Alarm 26 L/min  Ve Low Alarm 5 L/min  Resp Rate High Alarm 38 br/min  Resp Rate Low Alarm 8  PEEP Low Alarm 3 cmH2O  Press High Alarm 35 cmH2O  T Apnea 20 sec(s)  VAP Prevention  HOB> 30 Degrees Y  Breath Sounds  Bilateral Breath Sounds Diminished;Fine crackles  Vent Respiratory Assessment  Level of Consciousness Alert  Respiratory Pattern (S)   (Changed from HHFNC to BIPAP: Servo due to increased WOB.)  Oral Suctioning/Secretions  Suction Type Oral  Suction Device Yankauer  Secretion Amount Small  Secretion Color White  Secretion Consistency Thin  Suction Tolerance Tolerated fairly well  Suctioning Adverse Effects None

## 2023-01-23 NOTE — Progress Notes (Signed)
   01/23/23 0746  Vent Select  Invasive or Noninvasive Invasive  Adult Vent Y  Airway 7.5 mm  Placement Date/Time: 01/19/23 1800   Placed By: ED Physician  Airway Device: Endotracheal Tube  Laryngoscope Blade: 3  ETT Types: Subglottic;Oral  Size (mm): 7.5 mm  Cuffed: Cuffed  Insertion attempts: 1  Airway Equipment: Stylet;Video Laryngoscope  P...  Secured at (cm) 23 cm  Measured From Lips  Secured Location Center  Secured By English as a second language teacher No  Tube Holder Repositioned Yes  Prone position No  Head position Right  Cuff Pressure (cm H2O) Green OR 18-26 CmH2O  Site Condition Drainage (Comment)  Adult Ventilator Settings  Vent Type Servo i  Humidity HME  Vent Mode (S)  PSV;CPAP  FiO2 (%) 40 %  Pressure Support (S)  10 cmH20  PEEP (S)  5 cmH20  Adult Ventilator Measurements  Peak Airway Pressure 16 L/min  Mean Airway Pressure 7 cmH20  Resp Rate Spontaneous 18 br/min  Resp Rate Total 18 br/min  Spont TV 450 mL  Measured Ve 8.3 L  Total PEEP 5 cmH20  SpO2 94 %  Adult Ventilator Alarms  Alarms On Y  Ve High Alarm 20 L/min  Ve Low Alarm 4 L/min  Resp Rate High Alarm 35 br/min  Resp Rate Low Alarm 10  PEEP Low Alarm 3 cmH2O  Press High Alarm 45 cmH2O  T Apnea 20 sec(s)  VAP Prevention  HOB> 30 Degrees Y  Breath Sounds  Bilateral Breath Sounds Clear;Diminished;Fine crackles  R Upper  Breath Sounds Clear;Diminished  L Upper Breath Sounds Clear;Diminished  R Lower Breath Sounds Fine crackles  L Lower Breath Sounds Fine crackles  Vent Respiratory Assessment  Level of Consciousness Responds to Voice  Respiratory Pattern Regular;Unlabored  Suction Method  Respiratory Interventions Oral suction;Airway suction  Oral Suctioning/Secretions  Suction Type Oral  Suction Device Yankauer  Secretion Amount Small  Secretion Color White  Secretion Consistency Thin  Suction Tolerance Tolerated well  Suctioning Adverse Effects None  Airway Suctioning/Secretions   Suction Type ETT  Suction Device  Catheter  Secretion Amount Small  Secretion Color Pink tinged  Secretion Consistency Thin  Suction Tolerance Tolerated well  Suctioning Adverse Effects None

## 2023-01-23 NOTE — Progress Notes (Signed)
eLink Physician-Brief Progress Note Patient Name: Sabrina Roberson DOB: 1970-11-18 MRN: [id number]   Date of Service  01/23/2023  HPI/Events of Note  Phos level of 2.2 , K is ok, Calcium 7.2 (corrected is ok since albumin was severely low)  eICU Interventions  15 mmol sodium phos x 1      Intervention Category Major Interventions: Electrolyte abnormality - evaluation and management  Oretha Milch 01/23/2023, 5:40 AM

## 2023-01-23 NOTE — Progress Notes (Addendum)
   01/23/23 1754  Oxygen Therapy/Pulse Ox  O2 Device HFNC (Pt couldn't tolerate the heat on the HHFNC nor the bipap mask. Pt is tolerating the HFNC @ 15 L @ this time.)  O2 Therapy Oxygen humidified  O2 Flow Rate (L/min) (S)  15 L/min  FiO2 (%) 75 %  SpO2 90 %  Safety Instructions Yes (Comment)   @ 1800: 94% SP02.

## 2023-01-23 NOTE — Progress Notes (Addendum)
eLink Physician-Brief Progress Note Patient Name: Virgiline Yancy DOB: [birth year] MRN: [id number]   Date of Service  01/23/2023  HPI/Events of Note  Notified of tachypnea.   The patient is a 52/F with staph pneumonia superimposed on influenza. She was extubated earlier today. She could not tolerate BIPAP. She is saturating adequately on high flow nasal cannula.   On camera assessment, her husband is in the room.  Pt is awake and alert, RR 24.  RN reports that RR can go up to the 30s and 40s intermittently.   BP 121/62, HR 74, O2 sats 94%.   eICU Interventions  Get ABG and CXR.  Pt may need medication to allow for better tolerance for BIPAP if needed based on ABG and CXR.       Intervention Category Intermediate Interventions: Respiratory distress - evaluation and management  Larinda Buttery 01/23/2023, 7:58 PM  9:05 PM CXR with diffuse bilateral infiltrates but appears unchanged.  ABG 7.48/48/75 on high flow.   On camera reassessment, pt is sleeping, appears slightly tachypnec with RR at 27. She is saturating at 95%.   Plan> Continue to monitor for now on current O2.  No strong indication for non-invasive ventilation at this time.

## 2023-01-23 NOTE — Progress Notes (Signed)
   01/23/23 0930  Vent Select  Invasive or Noninvasive Invasive  Adult Vent Y  Airway 7.5 mm  Placement Date/Time: 01/19/23 1800   Placed By: ED Physician  Airway Device: Endotracheal Tube  Laryngoscope Blade: 3  ETT Types: Subglottic;Oral  Size (mm): 7.5 mm  Cuffed: Cuffed  Insertion attempts: 1  Airway Equipment: Stylet;Video Laryngoscope  P...  Secured at (cm) 23 cm  Measured From Lips  Secured Location Center  Secured By English as a second language teacher No  Prone position No  Head position Right  Cuff Pressure (cm H2O) Green OR 18-26 CmH2O  Site Condition Dry  Adult Ventilator Settings  Vent Type Servo i  Humidity HME  Vent Mode CPAP;PSV  FiO2 (%) 40 %  Pressure Support (S)  5 cmH20 (Decreased to 5.)  PEEP 5 cmH20  Adult Ventilator Measurements  Peak Airway Pressure 11 L/min  Mean Airway Pressure 6 cmH20  Resp Rate Spontaneous 21 br/min  Resp Rate Total 21 br/min  Spont TV 420 mL  Measured Ve 8.9 L  Total PEEP 5 cmH20  SpO2 94 %  Adult Ventilator Alarms  Alarms On Y  Ve High Alarm 20 L/min  Ve Low Alarm 4 L/min  Resp Rate High Alarm 35 br/min  Resp Rate Low Alarm 10  PEEP Low Alarm 3 cmH2O  Press High Alarm 45 cmH2O  T Apnea 20 sec(s)  VAP Prevention  HOB> 30 Degrees Y  Daily Weaning Assessment  Daily Assessment of Readiness to Wean Wean protocol criteria met (SBT performed)  SBT Method (S)  CPAP 5 cm H20 and PS 5 cm H20  Weaning Start Time 0930  Patient response Passed (Tolerated well)  Breath Sounds  Bilateral Breath Sounds Diminished;Fine crackles  Vent Respiratory Assessment  Level of Consciousness Alert (Pt followed commands, picked her head up on command.)  Respiratory Pattern Regular;Unlabored  Patient Tolerance Tolerated well  Suction Method  Respiratory Interventions  (not indicated at this time)

## 2023-01-23 NOTE — Procedures (Signed)
Extubation Procedure Note  Patient Details:   Name: Sabrina Roberson DOB: November 19, 1970 MRN: [id number]   Airway Documentation:    Vent end date: 01/23/23 Vent end time: 1105   Evaluation  O2 sats: currently acceptable Complications: No apparent complications Patient did tolerate procedure well. Bilateral Breath Sounds: Diminished   Yes  Nunzio Cobbs 01/23/2023, 11:23 AM Placed on bipap post extubation for desaturation to 85% on 5 L [place], BIPAP: Servo I: 10/5, RR 15, 40%, medium full face mask. Will continue to monitor, [place] on standby for intermittent breaks this evening.

## 2023-01-23 NOTE — Progress Notes (Signed)
   01/23/23 1550  Oxygen Therapy/Pulse Ox  O2 Device (S)  HHFNC (Placed bipap: Servo on standby.)  $ High Flow Nasal Cannula  Yes  O2 Therapy Oxygen humidified  Heater temperature 87.8 F (31 C)  O2 Flow Rate (L/min) (S)  60 L/min  FiO2 (%) (S)  68 %  SpO2 97 %  Safety Instructions Yes (Comment)

## 2023-01-23 NOTE — Plan of Care (Signed)
  Problem: Clinical Measurements: Goal: Cardiovascular complication will be avoided Outcome: Progressing   Problem: Nutrition: Goal: Adequate nutrition will be maintained Outcome: Progressing   Problem: Elimination: Goal: Will not experience complications related to bowel motility Outcome: Progressing Goal: Will not experience complications related to urinary retention Outcome: Progressing   Problem: Nutritional: Goal: Maintenance of adequate nutrition will improve Outcome: Progressing   Problem: Clinical Measurements: Goal: Respiratory complications will improve Outcome: Not Progressing   Problem: Coping: Goal: Level of anxiety will decrease Outcome: Not Progressing   Problem: Pain Management: Goal: General experience of comfort will improve Outcome: Not Progressing   Problem: Skin Integrity: Goal: Risk for impaired skin integrity will decrease Outcome: Not Progressing

## 2023-01-24 ENCOUNTER — Inpatient Hospital Stay (HOSPITAL_COMMUNITY): Payer: Medicare Other

## 2023-01-24 DIAGNOSIS — R6521 Severe sepsis with septic shock: Secondary | ICD-10-CM | POA: Diagnosis not present

## 2023-01-24 DIAGNOSIS — A419 Sepsis, unspecified organism: Secondary | ICD-10-CM | POA: Diagnosis not present

## 2023-01-24 LAB — BLOOD GAS, ARTERIAL
Acid-Base Excess: 10.9 mmol/L — ABNORMAL HIGH (ref 0.0–2.0)
Acid-Base Excess: 10.5 mmol/L — ABNORMAL HIGH (ref 0.0–2.0)
Bicarbonate: 35.7 mmol/L — ABNORMAL HIGH (ref 20.0–28.0)
Bicarbonate: 36.7 mmol/L — ABNORMAL HIGH (ref 20.0–28.0)
Drawn by: 23532
Drawn by: 25770
FIO2: 100 %
MECHVT: 430 mL
O2 Content: 15 L/min
O2 Saturation: 96.3 %
O2 Saturation: 99.1 %
PEEP: 5 cmH2O
Patient temperature: 37
Patient temperature: 37
RATE: 24 {breaths}/min
pCO2 arterial: 48 mm[Hg] (ref 32–48)
pCO2 arterial: 54 mm[Hg] — ABNORMAL HIGH (ref 32–48)
pH, Arterial: 7.48 — ABNORMAL HIGH (ref 7.35–7.45)
pH, Arterial: 7.44 (ref 7.35–7.45)
pO2, Arterial: 75 mm[Hg] — ABNORMAL LOW (ref 83–108)
pO2, Arterial: 231 mm[Hg] — ABNORMAL HIGH (ref 83–108)

## 2023-01-24 LAB — CBC WITH DIFFERENTIAL/PLATELET
Abs Immature Granulocytes: 0.23 10*3/uL — ABNORMAL HIGH (ref 0.00–0.07)
Basophils Absolute: 0 10*3/uL (ref 0.0–0.1)
Basophils Relative: 0 %
Eosinophils Absolute: 0 10*3/uL (ref 0.0–0.5)
Eosinophils Relative: 0 %
HCT: 28.9 % — ABNORMAL LOW (ref 36.0–46.0)
Hemoglobin: 9.4 g/dL — ABNORMAL LOW (ref 12.0–15.0)
Immature Granulocytes: 3 %
Lymphocytes Relative: 8 %
Lymphs Abs: 0.7 10*3/uL (ref 0.7–4.0)
MCH: 28.6 pg (ref 26.0–34.0)
MCHC: 32.5 g/dL (ref 30.0–36.0)
MCV: 87.8 fL (ref 80.0–100.0)
Monocytes Absolute: 0.4 10*3/uL (ref 0.1–1.0)
Monocytes Relative: 5 %
Neutro Abs: 7.4 10*3/uL (ref 1.7–7.7)
Neutrophils Relative %: 84 %
Platelets: 27 10*3/uL — CL (ref 150–400)
RBC: 3.29 MIL/uL — ABNORMAL LOW (ref 3.87–5.11)
RDW: 14.8 % (ref 11.5–15.5)
WBC: 8.8 10*3/uL (ref 4.0–10.5)
nRBC: 0.2 % (ref 0.0–0.2)

## 2023-01-24 LAB — BASIC METABOLIC PANEL
Anion gap: 11 (ref 5–15)
Anion gap: 8 (ref 5–15)
BUN: 16 mg/dL (ref 6–20)
BUN: 19 mg/dL (ref 6–20)
CO2: 32 mmol/L (ref 22–32)
CO2: 33 mmol/L — ABNORMAL HIGH (ref 22–32)
Calcium: 7.5 mg/dL — ABNORMAL LOW (ref 8.9–10.3)
Calcium: 7.5 mg/dL — ABNORMAL LOW (ref 8.9–10.3)
Chloride: 98 mmol/L (ref 98–111)
Chloride: 102 mmol/L (ref 98–111)
Creatinine, Ser: 0.81 mg/dL (ref 0.44–1.00)
Creatinine, Ser: 0.76 mg/dL (ref 0.44–1.00)
GFR, Estimated: >60 mL/min (ref >60)
GFR, Estimated: >60 mL/min (ref >60)
Glucose, Bld: 88 mg/dL (ref 70–99)
Glucose, Bld: 125 mg/dL — ABNORMAL HIGH (ref 70–99)
Potassium: 2.5 mmol/L — CL (ref 3.5–5.1)
Potassium: 2.9 mmol/L — ABNORMAL LOW (ref 3.5–5.1)
Sodium: 141 mmol/L (ref 135–145)
Sodium: 143 mmol/L (ref 135–145)

## 2023-01-24 LAB — GLUCOSE, CAPILLARY
Glucose-Capillary: 106 mg/dL — ABNORMAL HIGH (ref 70–99)
Glucose-Capillary: 116 mg/dL — ABNORMAL HIGH (ref 70–99)
Glucose-Capillary: 121 mg/dL — ABNORMAL HIGH (ref 70–99)
Glucose-Capillary: 148 mg/dL — ABNORMAL HIGH (ref 70–99)
Glucose-Capillary: 154 mg/dL — ABNORMAL HIGH (ref 70–99)
Glucose-Capillary: 91 mg/dL (ref 70–99)
Glucose-Capillary: 97 mg/dL (ref 70–99)

## 2023-01-24 LAB — CULTURE, BLOOD (ROUTINE X 2)
Culture: NO GROWTH
Culture: NO GROWTH
Special Requests: ADEQUATE
Special Requests: ADEQUATE

## 2023-01-24 LAB — PHOSPHORUS: Phosphorus: 2.7 mg/dL (ref 2.5–4.6)

## 2023-01-24 LAB — LEGIONELLA PNEUMOPHILA SEROGP 1 UR AG: L. pneumophila Serogp 1 Ur Ag: NEGATIVE

## 2023-01-24 LAB — MAGNESIUM: Magnesium: 1.9 mg/dL (ref 1.7–2.4)

## 2023-01-24 MED ORDER — MIDAZOLAM HCL 2 MG/2ML IJ SOLN
INTRAMUSCULAR | Status: AC
  Administered 2023-01-24: 2 mg
  Filled 2023-01-24: qty 2

## 2023-01-24 MED ORDER — PROPOFOL 1000 MG/100ML IV EMUL
0.0000 ug/kg/min | INTRAVENOUS | Status: DC
  Administered 2023-01-24: 50 ug/kg/min via INTRAVENOUS
  Administered 2023-01-24: 5 ug/kg/min via INTRAVENOUS
  Administered 2023-01-25 (×2): 50 ug/kg/min via INTRAVENOUS
  Administered 2023-01-25: 40 ug/kg/min via INTRAVENOUS
  Administered 2023-01-25 (×3): 50 ug/kg/min via INTRAVENOUS
  Administered 2023-01-26: 40 ug/kg/min via INTRAVENOUS
  Administered 2023-01-26: 35 ug/kg/min via INTRAVENOUS
  Administered 2023-01-26: 40 ug/kg/min via INTRAVENOUS
  Administered 2023-01-26: 45 ug/kg/min via INTRAVENOUS
  Administered 2023-01-27: 20 ug/kg/min via INTRAVENOUS
  Administered 2023-01-27: 30 ug/kg/min via INTRAVENOUS
  Administered 2023-01-27 (×2): 50 ug/kg/min via INTRAVENOUS
  Administered 2023-01-28: 40 ug/kg/min via INTRAVENOUS
  Filled 2023-01-24 (×13): qty 100
  Filled 2023-01-24: qty 200
  Filled 2023-01-24 (×3): qty 100

## 2023-01-24 MED ORDER — FENTANYL CITRATE (PF) 100 MCG/2ML IJ SOLN
INTRAMUSCULAR | Status: AC
  Administered 2023-01-24: 50 ug
  Filled 2023-01-24: qty 2

## 2023-01-24 MED ORDER — NOREPINEPHRINE 4 MG/250ML-% IV SOLN
0.0000 ug/min | INTRAVENOUS | Status: DC
Start: 2023-01-24 — End: 2023-02-01
  Administered 2023-01-24 – 2023-01-25 (×2): 2 ug/min via INTRAVENOUS
  Administered 2023-01-26: 5 ug/min via INTRAVENOUS
  Administered 2023-01-27: 6 ug/min via INTRAVENOUS
  Administered 2023-01-27: 8 ug/min via INTRAVENOUS
  Administered 2023-01-28: 7 ug/min via INTRAVENOUS
  Administered 2023-01-28: 6 ug/min via INTRAVENOUS
  Administered 2023-01-29: 3 ug/min via INTRAVENOUS
  Administered 2023-01-29: 6 ug/min via INTRAVENOUS
  Filled 2023-01-24 (×10): qty 250

## 2023-01-24 MED ORDER — POTASSIUM CHLORIDE 10 MEQ/50ML IV SOLN: 10.0000 meq | INTRAVENOUS | Status: DC

## 2023-01-24 MED ORDER — NICOTINE 21 MG/24HR TD PT24
21.0000 mg | MEDICATED_PATCH | Freq: Every day | TRANSDERMAL | Status: AC
  Administered 2023-01-24 – 2023-01-30 (×7): 21 mg via TRANSDERMAL
  Filled 2023-01-24 (×8): qty 1

## 2023-01-24 MED ORDER — HYDROCORTISONE SOD SUC (PF) 100 MG IJ SOLR
50.0000 mg | Freq: Two times a day (BID) | INTRAMUSCULAR | Status: DC
  Administered 2023-01-24 – 2023-01-26 (×4): 50 mg via INTRAVENOUS
  Filled 2023-01-24 (×4): qty 2

## 2023-01-24 MED ORDER — FENTANYL CITRATE PF 50 MCG/ML IJ SOSY
50.0000 ug | PREFILLED_SYRINGE | Freq: Once | INTRAMUSCULAR | Status: AC
  Administered 2023-01-24: 50 ug via INTRAVENOUS

## 2023-01-24 MED ORDER — PHENYLEPHRINE 80 MCG/ML (10ML) SYRINGE FOR IV PUSH (FOR BLOOD PRESSURE SUPPORT)
PREFILLED_SYRINGE | INTRAVENOUS | Status: AC
  Administered 2023-01-24: 160 ug
  Filled 2023-01-24: qty 10

## 2023-01-24 MED ORDER — POTASSIUM CHLORIDE 20 MEQ PO PACK
40.0000 meq | PACK | Freq: Once | ORAL | Status: AC
  Administered 2023-01-24: 40 meq
  Filled 2023-01-24: qty 2

## 2023-01-24 MED ORDER — FENTANYL 2500MCG IN NS 250ML (10MCG/ML) PREMIX INFUSION
50.0000 ug/h | INTRAVENOUS | Status: DC
  Administered 2023-01-24: 50 ug/h via INTRAVENOUS
  Administered 2023-01-25: 150 ug/h via INTRAVENOUS
  Administered 2023-01-25: 100 ug/h via INTRAVENOUS
  Administered 2023-01-26 – 2023-01-28 (×4): 150 ug/h via INTRAVENOUS
  Administered 2023-01-28: 200 ug/h via INTRAVENOUS
  Filled 2023-01-24 (×7): qty 250

## 2023-01-24 MED ORDER — FENTANYL BOLUS VIA INFUSION
50.0000 ug | INTRAVENOUS | Status: DC | PRN
  Administered 2023-01-24 – 2023-01-26 (×11): 100 ug via INTRAVENOUS
  Administered 2023-01-26 (×2): 50 ug via INTRAVENOUS
  Administered 2023-01-26 (×2): 100 ug via INTRAVENOUS
  Administered 2023-01-26: 50 ug via INTRAVENOUS
  Administered 2023-01-26 (×2): 100 ug via INTRAVENOUS
  Administered 2023-01-27 (×2): 50 ug via INTRAVENOUS
  Administered 2023-01-27 (×3): 100 ug via INTRAVENOUS
  Administered 2023-01-27: 50 ug via INTRAVENOUS
  Administered 2023-01-27: 100 ug via INTRAVENOUS
  Administered 2023-01-28 (×2): 50 ug via INTRAVENOUS
  Administered 2023-01-29 (×2): 100 ug via INTRAVENOUS

## 2023-01-24 MED ORDER — NICOTINE 7 MG/24HR TD PT24: 7.0000 mg | MEDICATED_PATCH | Freq: Every day | TRANSDERMAL | Status: DC

## 2023-01-24 MED ORDER — POTASSIUM CHLORIDE 10 MEQ/50ML IV SOLN
10.0000 meq | INTRAVENOUS | Status: AC
  Administered 2023-01-24 (×6): 10 meq via INTRAVENOUS
  Filled 2023-01-24 (×7): qty 50

## 2023-01-24 MED ORDER — DOCUSATE SODIUM 50 MG/5ML PO LIQD
100.0000 mg | Freq: Two times a day (BID) | ORAL | Status: DC
  Administered 2023-01-24 – 2023-01-30 (×5): 100 mg
  Filled 2023-01-24 (×7): qty 10

## 2023-01-24 MED ORDER — ACETAMINOPHEN 500 MG PO TABS: 500.0000 mg | ORAL_TABLET | Freq: Four times a day (QID) | ORAL | Status: DC | PRN

## 2023-01-24 MED ORDER — MIDAZOLAM HCL 2 MG/2ML IJ SOLN
1.0000 mg | INTRAMUSCULAR | Status: DC | PRN
  Filled 2023-01-24: qty 2

## 2023-01-24 MED ORDER — OSELTAMIVIR PHOSPHATE 75 MG PO CAPS
75.0000 mg | ORAL_CAPSULE | Freq: Two times a day (BID) | ORAL | Status: AC
  Filled 2023-01-24: qty 1

## 2023-01-24 MED ORDER — POLYETHYLENE GLYCOL 3350 17 G PO PACK
17.0000 g | PACK | Freq: Every day | ORAL | Status: DC
  Administered 2023-01-24 – 2023-01-28 (×3): 17 g
  Filled 2023-01-24 (×4): qty 1

## 2023-01-24 MED ORDER — POTASSIUM CHLORIDE 10 MEQ/50ML IV SOLN
10.0000 meq | INTRAVENOUS | Status: AC
Start: 2023-01-24 — End: 2023-01-25
  Administered 2023-01-24 – 2023-01-25 (×4): 10 meq via INTRAVENOUS
  Filled 2023-01-24 (×4): qty 50

## 2023-01-24 MED ORDER — ROCURONIUM BROMIDE 10 MG/ML (PF) SYRINGE
PREFILLED_SYRINGE | INTRAVENOUS | Status: AC
  Filled 2023-01-24: qty 10

## 2023-01-24 MED ORDER — ETOMIDATE 2 MG/ML IV SOLN
INTRAVENOUS | Status: AC
  Administered 2023-01-24: 20 mg
  Filled 2023-01-24: qty 20

## 2023-01-24 MED ORDER — FENTANYL 2500MCG IN NS 250ML (10MCG/ML) PREMIX INFUSION
INTRAVENOUS | Status: AC
  Filled 2023-01-24: qty 250

## 2023-01-24 MED ORDER — NICOTINE 14 MG/24HR TD PT24
14.0000 mg | MEDICATED_PATCH | Freq: Every day | TRANSDERMAL | Status: DC
  Administered 2023-01-31 – 2023-02-05 (×6): 14 mg via TRANSDERMAL
  Filled 2023-01-24 (×6): qty 1

## 2023-01-24 MED ORDER — DOCUSATE SODIUM 100 MG PO CAPS: 100.0000 mg | ORAL_CAPSULE | Freq: Two times a day (BID) | ORAL | Status: DC | PRN

## 2023-01-24 MED ORDER — DIAZEPAM 5 MG/ML IJ SOLN
2.5000 mg | Freq: Three times a day (TID) | INTRAMUSCULAR | Status: DC | PRN
  Administered 2023-01-24 (×2): 2.5 mg via INTRAVENOUS
  Filled 2023-01-24: qty 2

## 2023-01-24 MED ORDER — PROPOFOL 1000 MG/100ML IV EMUL
INTRAVENOUS | Status: AC
  Filled 2023-01-24: qty 100

## 2023-01-24 MED ORDER — POTASSIUM CHLORIDE 20 MEQ PO PACK: 40.0000 meq | PACK | Freq: Once | ORAL | Status: DC

## 2023-01-24 MED ORDER — DIAZEPAM 5 MG/ML IJ SOLN
INTRAMUSCULAR | Status: AC
  Filled 2023-01-24: qty 2

## 2023-01-24 MED ORDER — FUROSEMIDE 10 MG/ML IJ SOLN
40.0000 mg | Freq: Once | INTRAMUSCULAR | Status: AC
Start: 2023-01-24 — End: 2023-01-24
  Administered 2023-01-24: 40 mg via INTRAVENOUS
  Filled 2023-01-24: qty 4

## 2023-01-24 MED ORDER — ATORVASTATIN CALCIUM 40 MG PO TABS
20.0000 mg | ORAL_TABLET | Freq: Every day | ORAL | Status: DC
  Administered 2023-01-25: 20 mg via ORAL
  Filled 2023-01-24: qty 1

## 2023-01-24 NOTE — Progress Notes (Signed)
PT Cancellation Note  Patient Details Name: Sabrina Roberson MRN: [id number] DOB: 01-28-1971   Cancelled Treatment:    Reason Eval/Treat Not Completed: Patient not medically ready  Per RN.  Blanchard Kelch PT Acute Rehabilitation Services Office [phone] Weekend pager-[phone]  Rada Hay 01/24/2023, 9:00 AM

## 2023-01-24 NOTE — Progress Notes (Signed)
Notified Lab Alcario Drought) that ABG being sent for analysis.

## 2023-01-24 NOTE — Progress Notes (Signed)
NAME:  Sabrina Roberson, MRN:  [id number], DOB:  [birth year], LOS: 5 ADMISSION DATE:  01/19/2023, CONSULTATION DATE: 01/19/2023 REFERRING MD: Royanne Foots, DO, CHIEF COMPLAINT: AMS and hypoxia today  History of Present Illness:  A 52 y.o. female patient with mild asthma on Albuterol PRN, DM-2, HTN, dyslipidemia, hypothyroidism, GERD, and tobacco smoking (1 ppd for 30 yrs) who presented to ED with AMS, hypoxia 48% on RA, SOB, DOE, for dry cough for the last 2 days. Today he noted her to be confused and very somnolent while sitting on the couch with labored breathing. Regarding sick contacts, her husband notes the patient was watching a grandchild with recent viral URI symptoms earlier this week. No F/c/r, N/V/D, abd pain, rash, CP, illicit drug use, or alcohol intake.    Pertinent  Medical History  Mild asthma on Albuterol PRN, DM-2, HTN, dyslipidemia, hypothyroidism, GERD  Significant Hospital Events: Including procedures, antibiotic start and stop dates in addition to other pertinent events   12/22 Admit with ARDS secondary to Influenza A PNA 12/23 Worsening ARDS, septic shock  12/24 Fio2 and PEEP requirements decreasing along with pressors requirements, flagyl dc'd  12/25 MRSA in tracheal aspirate, on low dose pressors, azithromycin dc'd  12/26 Weaning trials, continues on low dose pressors, continue cetriaxone/vanc   Interim History / Subjective:   Extubated yesterday. Difficulty tolerating NIV and HHFN. Currently on HFNC. Diuresed Levophed yesterday Objective   Blood pressure 123/71, pulse 70, temperature 98.6 F (37 C), temperature source Axillary, resp. rate (!) 36, height 5\' 4"  (1.626 m), weight 69.2 kg, SpO2 96%.    Vent Mode: PCV;BIPAP FiO2 (%):  [40 %-75 %] 75 % Set Rate:  [15 bmp-16 bmp] 16 bmp PEEP:  [5 cmH20] 5 cmH20 Pressure Support:  [5 cmH20-10 cmH20] 5 cmH20   Intake/Output Summary (Last 24 hours) at 01/24/2023 0730 Last data filed at 01/24/2023 1017 Gross per  24 hour  Intake 738.62 ml  Output 6960 ml  Net -6221.38 ml   Filed Weights   01/22/23 0328 01/23/23 0219 01/24/23 0400  Weight: 78.4 kg 76.5 kg 69.2 kg   Physical Exam: General: Chronically ill-appearing, no acute distress HENT: [place], AT, OP clear, MMM, hoarse Eyes: EOMI, no scleral icterus Respiratory: Diminished, improved rhonchi bilaterally.  No crackles, wheezing or rales Cardiovascular: RRR, -M/R/G, no JVD GI: BS+, soft, nontender Extremities: Trace pedal edema,-tenderness Neuro: AAO x3, CNII-XII grossly intact GU: Foley in place  Resolved Hospital Problem list   N/a   Assessment & Plan:   Severe ARDS  due to viral PNA (influenza A) and MRSA pneumonia Mild Asthma Hx  PF ratio improved On 40%, 5 of PEEP  12/26 Chest X-ray - BL pulmonary infiltrates, possible small pleural effusions  P:  Extubated 12/26 Transitioned to [facility] due to high O2 requirements Continue Tamiflu. End date 12/27 Continue Vanc Consider diuresis this PM Swallow evaluation by Speech pending  Septic shock  Staph pneumonia superimposed on influenza Possible colitis seen on CT abdominal scan-low suspicion  Echo EF 50-55%, RV and LF low normal function, no regional wall abnormalities  Vasopressin stopped 12/24  Levophed stopped 12/26 P: Continue vanc Day 5 Off pressors. Reduce stress dose steroids to BID  Leukopenia resolved Thrombocytopenia in setting of Septic shock  No signs of bleeding P: Continue to trend CBC daily, monitor for signs of bleeding Transfuse if hgb < 7  Continue to hold subQ heparin  Fluid Electrolyte imbalance  Hypokalemia Hypophosphatemia  Hypocalcemic  P:  Repletion orders per  elink  Continue to trend BMET at noon and daily   DM-2 CBGs stable  P: Continue CBG q 4 Continue SSI Hold tube feeds see above   HTN P: Continue to hold antihypertensives  Dyslipidemia P: Continue Statin per tube   Hypothyroidism P:  Restart levothyroxine when appropriate    GERD P:  Continue PPI daily   Tobacco smoking (30 PY)  P:  Counseled on smoking cessation Start nicotine patch  Anxiety/Depression P: Restart wellbutrin when able On valium 5 mg TID PRN at home  Latex Allergy hx Latex foley removed Perineal swelling improving  P: Continue to use non-latex catheters and products     Best Practice (right click and "Reselect all SmartList Selections" daily)   Diet/type:  TF DVT prophylaxis  heparin on hold, SCDs Pressure ulcer(s): N/A GI prophylaxis: PPI On at home Lines: CVC 12/23  Foley:  Yes, and it is still needed Code Status:  full code Last date of multidisciplinary goals of care discussion updated husband at beside 12/26    Critical care time: 35 min    The patient is critically ill with respiratory failure 2/2 influenza c/b MRSA pneumoniaand requires high complexity decision making for assessment and support, frequent evaluation and titration of therapies, application of advanced monitoring technologies and extensive interpretation of multiple databases.  Independent Critical Care Time: 35 Minutes.   Mechele Collin, M.D. [facility] Pulmonary/Critical Care Medicine 01/24/2023 7:46 AM   Please see Amion for pager number to reach on-call Pulmonary and Critical Care Team.

## 2023-01-24 NOTE — Progress Notes (Signed)
Pharmacy Antibiotic Note  Sabrina Roberson is a 52 y.o. female admitted on 01/19/2023 with sepsis, multi-focal pneumonia, influenza A, and colitis.  Pharmacy has been consulted for Vancomycin dosing.  Today, 01/24/2023: D6 vancomycin + Tamiflu Remains afebrile Neutropenia on admission resolved Mild AKI on admission resolved PCT elevated on admission; not trending Clinically improving per CCM Extubated 12/26 but unable to take PO meds d/t BiPAP/hypoxia & aspiration risk (will miss last 2 doses of Tamiflu) Severe thrombocytopenia (presumably d/t sepsis); nadir at 23k on 12/26; now trending up slightly (27) ID and CCM discussed high vanc MIC (2); CCM wishes to avoid Linezolid d/t thrombocytopenia  Plan: Continue vancomycin 750 mg IV q12 (eAUC 427, SCr 0.81, Vd 0.72  Follow renal function; LOT   Height: 5\' 4"  (162.6 cm) Weight: 69.2 kg (152 lb 8.9 oz) IBW/kg (Calculated) : 54.7  Temp (24hrs), Avg:98.7 F (37.1 C), Min:97.5 F (36.4 C), Max:99 F (37.2 C)  Recent Labs  Lab 01/19/23 1708 01/19/23 2024 01/19/23 2328 01/20/23 0505 01/20/23 0850 01/20/23 1532 01/21/23 0355 01/21/23 1600 01/21/23 1622 01/22/23 0448 01/23/23 0228 01/24/23 0550  WBC 1.0*  --   --  1.0*  --   --  7.8  --   --  6.7 9.4 8.8  CREATININE 1.11*  --   --  0.80  --    < > 0.72 0.74  --  0.74 0.82 0.81  LATICACIDVEN 4.0* 3.1* 3.6*  --  2.2*  --   --   --  2.6*  --   --   --    < > = values in this interval not displayed.    Estimated Creatinine Clearance: 79.4 mL/min (by C-G formula based on SCr of 0.81 mg/dL).    Allergies  Allergen Reactions   Latex Anaphylaxis and Swelling   Codeine Nausea And Vomiting    Other reaction(s): GI Upset (intolerance), Vomiting (intolerance)   Ibuprofen Other (See Comments)    GI upset, burning sensation   Oxycodone Nausea And Vomiting    Pre-medication with phenergan needed.   Tape Itching and Rash    Please use "paper" tape only.   Antimicrobials this  admission: 12/22 Vancomycin >>   12/22 Oseltamivir >> 12/27 12/23 Ceftriaxone >> 12/25 12/23 Metronidazole >> 12/24 12/23 Azithromycin >> 12/24  Microbiology results: 12/22 Resp panel: +Influenza A 12/22 BCx: NGF 12/22 MRSA PCR: + 12/23 TA: few yeast; mod MRSA (vanc MIC = 2; S-TET) 12/23 strep pneumo neg   Thank you for allowing pharmacy to be a part of this patient's care.  Bernadene Person, PharmD, BCPS [phone] 01/24/2023, 8:16 AM

## 2023-01-24 NOTE — Evaluation (Signed)
Clinical/Bedside Swallow Evaluation Patient Details  Name: Sabrina Roberson MRN: [id number] Date of Birth: 08/09/1970  Today's Date: 01/24/2023 Time: SLP Start Time (ACUTE ONLY): 1036 SLP Stop Time (ACUTE ONLY): 1125 SLP Time Calculation (min) (ACUTE ONLY): 49 min  Past Medical History:  Past Medical History:  Diagnosis Date   Allergy    Anxiety    Arthritis    Asthma    Bruises easily    Chronic chest wall pain    Chronic pain    Cough    Depression    Diabetes mellitus without complication (HCC)    Dyspnea    chronic   Encounter for therapeutic drug monitoring 02/11/2011   Fibromyalgia    Functional movement disorder    GERD (gastroesophageal reflux disease)    Headache    Hearing loss    High cholesterol    History of hiatal hernia    Hypertension    Hypothyroidism    Kidney disease    stage 3   Leg swelling    both   Nausea 02/11/2011   Nausea and vomiting 04/01/2013   Pain management    PONV (postoperative nausea and vomiting)    SMAS (superior mesenteric artery syndrome) (HCC)    Thyroid disease    TIA (transient ischemic attack)    Tic disorder    TMJ (dislocation of temporomandibular joint)    Transient cerebral ischemia    Type 2 diabetes mellitus with complications (HCC)    Past Surgical History:  Past Surgical History:  Procedure Laterality Date   ABDOMINAL HYSTERECTOMY     AORTA - SUPERIOR MESENTERIC AND AORTA - RENAL ARTERY BYPASS GRAFT     APPENDECTOMY     BREAST CYST EXCISION Right    BREAST EXCISIONAL BIOPSY     CHOLECYSTECTOMY     cyst removed     HERNIA REPAIR     OOPHORECTOMY     bilateral   TEE WITHOUT CARDIOVERSION N/A 06/18/2012   Procedure: TRANSESOPHAGEAL ECHOCARDIOGRAM (TEE);  Surgeon: Thurmon Fair, MD;  Location: [facility] ENDOSCOPY;  Service: Cardiovascular;  Laterality: N/A;   HPI:  Patient is a 52 year old female admitted to [facility] with cough x 2 days prior to admission and altered mental status.  Patient  found to have influenza A, shock, ARDS, with increased oxygen needs and was intubated 12 22-12 26.  After extubation patient desatted was placed on BiPAP and salter.  Past medical history positive for gerd, mild asthma, smoker, fibromyalgia, takes care of grandchild, adm from [facility] with cough x2 days prior to admission, found to have influenza A, shock, ARDS, requiring incr oxygen, intubated 12/22 to 12/26- desat then on Salter, CXR worsening, other past medical history includes h/o tremors, gait issues, pain, tremor, nausea,  confusion, visual changes, hearing loss, difficulty walking, numbness, weakness and possible Bell's palsy.  She had been worked up previously for potential multiple sclerosis s/p 2015 images, prior gastric emptying study - normal in the past.  Chest x-ray from last evening shows diffuse airspace disease.    Assessment / Plan / Recommendation  Clinical Impression  Pt on HHFNC with oxygen at 65% and 60 Liters flow rate with RR varying from 30's to 40's and clear accessory muscle usage.  Following oral care with toothbrush and paste and removal of upper partial, pt coughing up viscous white tinged secretions - with max verbal cues to expectorate.  This coughing was exhausting for her resulting in HR up to 170's and RR to the  40's.  Pt with grossly weak voice and is deconditioned. She requires max verbal cues to seal lips on spoon for small single ice chip and 1/2 tsp water. Delayed weak cough noted - concerning for airway infiltration without adequate clearance.  Highly recommend pt be NPO - x small single ice chips given by staff only after oral care. Anticipate pt will require alternative means of nutrition and several days for her to recover adequately for po intake.  Advised pt and her spouse to recommendations using teach back. Of note, pt reports occasional "sensing food going the wrong way" prior to admission, had negative gastric emptying test in 2015.  Will follow for po  readiness/instrumental evaluation indication. SLP Visit Diagnosis: Dysphagia, unspecified (R13.10)    Aspiration Risk  Severe aspiration risk;Risk for inadequate nutrition/hydration    Diet Recommendation NPO;Ice chips PRN after oral care    Medication Administration: Via alternative means Supervision: Full supervision/cueing for compensatory strategies Postural Changes: Seated upright at 90 degrees;Remain upright for at least 30 minutes after po intake    Other  Recommendations Oral Care Recommendations: Oral care prior to ice chip/H20    Recommendations for follow up therapy are one component of a multi-disciplinary discharge planning process, led by the attending physician.  Recommendations may be updated based on patient status, additional functional criteria and insurance authorization.  Follow up Recommendations Other (comment) (TBD)      Assistance Recommended at Discharge  TBD  Functional Status Assessment Patient has had a recent decline in their functional status and demonstrates the ability to make significant improvements in function in a reasonable and predictable amount of time.  Frequency and Duration min 1 x/week  1 week       Prognosis Prognosis for improved oropharyngeal function: Good      Swallow Study   General Date of Onset: 01/24/23 HPI: Patient is a 52 year old female admitted to [facility] with cough x 2 days prior to admission and altered mental status.  Patient found to have influenza A, shock, ARDS, with increased oxygen needs and was intubated 12 22-12 26.  After extubation patient desatted was placed on BiPAP and salter.  Past medical history positive for gerd, mild asthma, smoker, fibromyalgia, takes care of grandchild, adm from [facility] with cough x2 days prior to admission, found to have influenza A, shock, ARDS, requiring incr oxygen, intubated 12/22 to 12/26- desat then on Salter, CXR worsening, other past medical history includes  h/o tremors, gait issues, pain, tremor, nausea,  confusion, visual changes, hearing loss, difficulty walking, numbness, weakness and possible Bell's palsy.  She had been worked up previously for potential multiple sclerosis s/p 2015 images, prior gastric emptying study - normal in the past.  Chest x-ray from last evening shows diffuse airspace disease. Type of Study: Bedside Swallow Evaluation Diet Prior to this Study: NPO Temperature Spikes Noted: No Respiratory Status: Other (comment) (HHFNC 60 Liters, 65% oxygen) History of Recent Intubation: Yes Total duration of intubation (days): 5 days Date extubated: 01/23/23 Behavior/Cognition: Alert;Pleasant mood;Lethargic/Drowsy Oral Cavity Assessment: Other (comment);Dry (minimal white coating on tongue and in oral cavity) Oral Care Completed by SLP: Yes Oral Cavity - Dentition: Missing dentition;Other (Comment) (upper partial) Self-Feeding Abilities: Needs assist;Total assist Patient Positioning: Other (comment) (partially upright for maximum comfort) Baseline Vocal Quality: Low vocal intensity;Aphonic Volitional Cough: Strong;Other (Comment) (productive after oral care to viscous white tinged secretions, ? consistent with oral candidiasis)    Oral/Motor/Sensory Function Overall Oral Motor/Sensory Function: Generalized oral weakness Facial  ROM: Within Functional Limits Facial Symmetry: Within Functional Limits Facial Sensation: Within Functional Limits Lingual ROM: Within Functional Limits Lingual Symmetry: Within Functional Limits Lingual Strength: Reduced Lingual Sensation: Reduced Velum: Within Functional Limits Mandible: Within Functional Limits   Ice Chips Ice chips: Impaired Presentation: Spoon;Self Fed Pharyngeal Phase Impairments: Suspected delayed Swallow;Throat Clearing - Delayed   Thin Liquid Thin Liquid: Impaired Presentation: Spoon Oral Phase Impairments: Reduced labial seal Oral Phase Functional Implications: Oral  holding Pharyngeal  Phase Impairments: Throat Clearing - Immediate    Nectar Thick Nectar Thick Liquid: Not tested   Honey Thick Honey Thick Liquid: Not tested   Puree Puree: Not tested   Solid     Solid: Not tested      Chales Abrahams 01/24/2023,12:21 PM  Rolena Infante, MS [facility] SLP Acute Rehab Services Office [phone]

## 2023-01-24 NOTE — Progress Notes (Signed)
CCM MD has indicated that femoral arterial line will be discontinued today. RN aware that RT does not remove femoral lines due to bleeding time.

## 2023-01-24 NOTE — Progress Notes (Signed)
CCM provided order for arterial line to remain installed for additional day. Line has good waveform, draws and flushed blood.

## 2023-01-24 NOTE — Procedures (Signed)
Intubation Procedure Note  Lucky Thakker  [id number]  08/27/1970  Date:01/24/23  Time:2:45 PM   Provider Performing:Ivadell Gaul Mechele Collin, MD   Procedure: Intubation (31500)  Indication(s) Respiratory Failure  Consent Risks of the procedure as well as the alternatives and risks of each were explained to the patient and/or caregiver.  Consent for the procedure was obtained and is signed in the bedside chart   Anesthesia Etomidate, Versed, and Fentanyl   Time Out Verified patient identification, verified procedure, site/side was marked, verified correct patient position, special equipment/implants available, medications/allergies/relevant history reviewed, required imaging and test results available.   Sterile Technique Usual hand hygeine, masks, and gloves were used   Procedure Description Patient positioned in bed supine.  Sedation given as noted above.  Patient was intubated with endotracheal tube using Glidescope.  View was Grade 1 full glottis .  Number of attempts was 1.  Colorimetric CO2 detector was consistent with tracheal placement.   Complications/Tolerance None; patient tolerated the procedure well. Chest X-ray is ordered to verify placement.   EBL Minimal   Specimen(s) None

## 2023-01-24 NOTE — Progress Notes (Signed)
PCCM Progress Note  Worsening respiratory distress with tachypnea into 40-50s despite BiPAP and PRN valium. Decision to re-intubate discussed with patient, mom and sister at bedside. Intubated without complication.

## 2023-01-24 NOTE — Progress Notes (Addendum)
Nutrition Follow-up  DOCUMENTATION CODES:   Not applicable  INTERVENTION:  - Once medically appropriate to start tube feeds and new OGT placed, would recommend: Vital 1.5 at 50 ml/h (1200 ml per day) *Would recommend starting at 60mL/hr and advancing by 10mL Q12H Prosource TF20 60 ml daily Provides 1880 kcal, 101 gm protein, 917 ml free water daily  - Monitor magnesium, potassium, and phosphorus BID for at least 3 days, MD to replete as needed, as pt is at risk for refeeding syndrome.  - FWF per CCM/MD.   NUTRITION DIAGNOSIS:   Inadequate oral intake related to inability to eat as evidenced by NPO status. *new  GOAL:   Patient will meet greater than or equal to 90% of their needs *not met  MONITOR:   Vent status, Labs, Weight trends, TF tolerance  REASON FOR ASSESSMENT:   Consult, Ventilator Enteral/tube feeding initiation and management  ASSESSMENT:   52 y.o. female patient with mild asthma on Albuterol PRN, DM-2, HTN, dyslipidemia, hypothyroidism, GERD, and tobacco smoking (1 ppd for 30 yrs) who presented to ED with AMS, hypoxia 48% on RA, SOB, DOE, for dry cough for the last 2 days. Today he noted her to be confused and very somnolent while sitting on the couch with labored breathing.  12/22 Admit, Intubated 12/23: TF initiated 12/26: Extubated, OGT removed 12/27: SLP eval -> NPO; later developed worsening respiratory distress and re-intubated  Attempted to visit with patient at bedside this AM but patient working with SLP at time of visit.  Patient later developed worsening respiratory distress and had to be re-intubated.   Patient is currently intubated on ventilator support MV: 8.7 L/min Temp (24hrs), Avg:98.2 F (36.8 C), Min:97.5 F (36.4 C), Max:98.6 F (37 C) Levo and Propofol started.   No new OGT yet placed.    Admit weight: 139# Current weight: 152# I&O's: +3.5L  Medications reviewed and include: Colace, Miralax Fentanyl Levophed @  75mcg/min Propofol @ 16.50mL/hr (provides 438 kcals over 24 hours)  Labs reviewed:  K+ 2.5 HA1C 6.9 Blood Glucose 97-175 x24 hours   NUTRITION - FOCUSED PHYSICAL EXAM:  Unable to obtain  Diet Order:   Diet Order             Diet NPO time specified  Diet effective now                   EDUCATION NEEDS:  Not appropriate for education at this time  Skin:  Skin Assessment: Reviewed RN Assessment  Last BM:  12/26 - type 7 - rectal tube  Height:  Ht Readings from Last 1 Encounters:  01/24/23 5\' 4"  (1.626 m)   Weight:  Wt Readings from Last 1 Encounters:  01/24/23 69.2 kg    BMI:  Body mass index is 26.19 kg/m.  Estimated Nutritional Needs:  Kcal:  1700-1900 kcals Protein:  80-100 grams Fluid:  >/= 1.7L    Shelle Iron RD, LDN Contact via Secure Chat.

## 2023-01-25 DIAGNOSIS — A419 Sepsis, unspecified organism: Secondary | ICD-10-CM | POA: Diagnosis not present

## 2023-01-25 DIAGNOSIS — R6521 Severe sepsis with septic shock: Secondary | ICD-10-CM | POA: Diagnosis not present

## 2023-01-25 LAB — CBC
HCT: 27.9 % — ABNORMAL LOW (ref 36.0–46.0)
Hemoglobin: 9.1 g/dL — ABNORMAL LOW (ref 12.0–15.0)
MCH: 29.4 pg (ref 26.0–34.0)
MCHC: 32.6 g/dL (ref 30.0–36.0)
MCV: 90.3 fL (ref 80.0–100.0)
Platelets: 63 10*3/uL — ABNORMAL LOW (ref 150–400)
RBC: 3.09 MIL/uL — ABNORMAL LOW (ref 3.87–5.11)
RDW: 14.6 % (ref 11.5–15.5)
WBC: 9.9 10*3/uL (ref 4.0–10.5)
nRBC: 0.6 % — ABNORMAL HIGH (ref 0.0–0.2)

## 2023-01-25 LAB — MAGNESIUM
Magnesium: 2 mg/dL (ref 1.7–2.4)
Magnesium: 2.1 mg/dL (ref 1.7–2.4)

## 2023-01-25 LAB — BLOOD GAS, ARTERIAL
Acid-Base Excess: 15.1 mmol/L — ABNORMAL HIGH (ref 0.0–2.0)
Bicarbonate: 39.4 mmol/L — ABNORMAL HIGH (ref 20.0–28.0)
Drawn by: 23532
FIO2: 0.3 %
MECHVT: 430 mL
O2 Saturation: 88.5 %
PEEP: 5 cmH2O
Patient temperature: 36.1
RATE: 24 {breaths}/min
pCO2 arterial: 43 mm[Hg] (ref 32–48)
pH, Arterial: 7.56 — ABNORMAL HIGH (ref 7.35–7.45)
pO2, Arterial: 53 mm[Hg] — ABNORMAL LOW (ref 83–108)

## 2023-01-25 LAB — BASIC METABOLIC PANEL
Anion gap: 8 (ref 5–15)
BUN: 19 mg/dL (ref 6–20)
CO2: 36 mmol/L — ABNORMAL HIGH (ref 22–32)
Calcium: 7.8 mg/dL — ABNORMAL LOW (ref 8.9–10.3)
Chloride: 99 mmol/L (ref 98–111)
Creatinine, Ser: 0.82 mg/dL (ref 0.44–1.00)
GFR, Estimated: >60 mL/min (ref >60)
Glucose, Bld: 110 mg/dL — ABNORMAL HIGH (ref 70–99)
Potassium: 2.8 mmol/L — ABNORMAL LOW (ref 3.5–5.1)
Sodium: 143 mmol/L (ref 135–145)

## 2023-01-25 LAB — GLUCOSE, CAPILLARY
Glucose-Capillary: 109 mg/dL — ABNORMAL HIGH (ref 70–99)
Glucose-Capillary: 116 mg/dL — ABNORMAL HIGH (ref 70–99)
Glucose-Capillary: 134 mg/dL — ABNORMAL HIGH (ref 70–99)
Glucose-Capillary: 137 mg/dL — ABNORMAL HIGH (ref 70–99)
Glucose-Capillary: 149 mg/dL — ABNORMAL HIGH (ref 70–99)
Glucose-Capillary: 180 mg/dL — ABNORMAL HIGH (ref 70–99)

## 2023-01-25 LAB — PHOSPHORUS
Phosphorus: <1 mg/dL — CL (ref 2.5–4.6)
Phosphorus: 3.4 mg/dL (ref 2.5–4.6)

## 2023-01-25 LAB — TRIGLYCERIDES: Triglycerides: 222 mg/dL — ABNORMAL HIGH (ref <150)

## 2023-01-25 LAB — POTASSIUM: Potassium: 3.7 mmol/L (ref 3.5–5.1)

## 2023-01-25 MED ORDER — POTASSIUM PHOSPHATES 15 MMOLE/5ML IV SOLN
15.0000 mmol | Freq: Once | INTRAVENOUS | Status: AC
  Administered 2023-01-25: 15 mmol via INTRAVENOUS
  Filled 2023-01-25: qty 5

## 2023-01-25 MED ORDER — POTASSIUM CHLORIDE 20 MEQ PO PACK: 40.0000 meq | PACK | Freq: Once | ORAL | Status: DC

## 2023-01-25 MED ORDER — POTASSIUM & SODIUM PHOSPHATES 280-160-250 MG PO PACK: 2.0000 | PACK | Freq: Four times a day (QID) | ORAL | Status: DC

## 2023-01-25 MED ORDER — PIVOT 1.5 CAL PO LIQD
1000.0000 mL | ORAL | Status: DC
  Filled 2023-01-25: qty 1000

## 2023-01-25 MED ORDER — PROSOURCE TF20 ENFIT COMPATIBL EN LIQD
60.0000 mL | Freq: Every day | ENTERAL | Status: DC
  Administered 2023-01-25 – 2023-02-03 (×10): 60 mL
  Filled 2023-01-25 (×10): qty 60

## 2023-01-25 MED ORDER — POTASSIUM PHOSPHATES 15 MMOLE/5ML IV SOLN
15.0000 mmol | Freq: Once | INTRAVENOUS | Status: DC
  Filled 2023-01-25: qty 5

## 2023-01-25 MED ORDER — CLONAZEPAM 1 MG PO TABS
1.0000 mg | ORAL_TABLET | Freq: Three times a day (TID) | ORAL | Status: DC
  Administered 2023-01-25 – 2023-01-26 (×6): 1 mg
  Filled 2023-01-25 (×6): qty 1

## 2023-01-25 MED ORDER — POTASSIUM PHOSPHATES 15 MMOLE/5ML IV SOLN
30.0000 mmol | Freq: Once | INTRAVENOUS | Status: AC
  Administered 2023-01-25: 30 mmol via INTRAVENOUS
  Filled 2023-01-25: qty 10

## 2023-01-25 MED ORDER — ATORVASTATIN CALCIUM 40 MG PO TABS
20.0000 mg | ORAL_TABLET | Freq: Every day | ORAL | Status: DC
  Administered 2023-01-26 – 2023-02-03 (×9): 20 mg
  Filled 2023-01-25 (×9): qty 1

## 2023-01-25 MED ORDER — POTASSIUM PHOSPHATES 15 MMOLE/5ML IV SOLN: 45.0000 mmol | Freq: Once | INTRAVENOUS | Status: DC

## 2023-01-25 MED ORDER — CHLORHEXIDINE GLUCONATE CLOTH 2 % EX PADS
6.0000 | MEDICATED_PAD | Freq: Every day | CUTANEOUS | Status: DC
  Administered 2023-01-26 – 2023-02-04 (×10): 6 via TOPICAL

## 2023-01-25 MED ORDER — LEVOTHYROXINE SODIUM 100 MCG PO TABS
100.0000 ug | ORAL_TABLET | Freq: Every day | ORAL | Status: DC
  Administered 2023-01-26 – 2023-02-03 (×9): 100 ug
  Filled 2023-01-25 (×9): qty 1

## 2023-01-25 MED ORDER — VITAL 1.5 CAL PO LIQD
1000.0000 mL | ORAL | Status: DC
  Administered 2023-01-25 – 2023-01-31 (×6): 1000 mL
  Filled 2023-01-25 (×12): qty 1000

## 2023-01-25 MED ORDER — ACETAMINOPHEN 500 MG PO TABS
500.0000 mg | ORAL_TABLET | Freq: Four times a day (QID) | ORAL | Status: DC | PRN
  Administered 2023-01-30: 500 mg
  Filled 2023-01-25: qty 1

## 2023-01-25 MED ORDER — SODIUM CHLORIDE 0.9 % IV SOLN: INTRAVENOUS | Status: DC | PRN

## 2023-01-25 MED ORDER — POTASSIUM CHLORIDE 10 MEQ/50ML IV SOLN
10.0000 meq | INTRAVENOUS | Status: AC
Start: 2023-01-25 — End: 2023-01-25
  Administered 2023-01-25 (×4): 10 meq via INTRAVENOUS
  Filled 2023-01-25 (×4): qty 50

## 2023-01-25 NOTE — Progress Notes (Signed)
NAME:  Sabrina Roberson, MRN:  [id number], DOB:  05-Jun-1970, LOS: 6 ADMISSION DATE:  01/19/2023, CONSULTATION DATE: 01/19/2023 REFERRING MD: Royanne Foots, DO, CHIEF COMPLAINT: AMS and hypoxia today  History of Present Illness:  A 52 y.o. female patient with mild asthma on Albuterol PRN, DM-2, HTN, dyslipidemia, hypothyroidism, GERD, and tobacco smoking (1 ppd for 30 yrs) who presented to ED with AMS, hypoxia 48% on RA, SOB, DOE, for dry cough for the last 2 days. Today he noted her to be confused and very somnolent while sitting on the couch with labored breathing. Regarding sick contacts, her husband notes the patient was watching a grandchild with recent viral URI symptoms earlier this week. No F/c/r, N/V/D, abd pain, rash, CP, illicit drug use, or alcohol intake.    Pertinent  Medical History  Mild asthma on Albuterol PRN, DM-2, HTN, dyslipidemia, hypothyroidism, GERD  Significant Hospital Events: Including procedures, antibiotic start and stop dates in addition to other pertinent events   12/22 Admit with ARDS secondary to Influenza A PNA 12/23 Worsening ARDS, septic shock  12/24 Fio2 and PEEP requirements decreasing along with pressors requirements, flagyl dc'd  12/25 MRSA in tracheal aspirate, on low dose pressors, azithromycin dc'd  12/26 Extubated 12/28 Re-intubated for respiratory distress/anxiety  Interim History / Subjective:   Reintubated yesterday. Anxiety likely driving factor. CXR improved infiltrates after diuresis On minimal vent settings today Objective   Blood pressure (!) 92/38, pulse 77, temperature 98.3 F (36.8 C), temperature source Axillary, resp. rate (!) 23, height 5\' 4"  (1.626 m), weight 66.5 kg, SpO2 91%.    Vent Mode: PRVC FiO2 (%):  [30 %-100 %] 30 % Set Rate:  [24 bmp] 24 bmp Vt Set:  [430 mL] 430 mL PEEP:  [5 cmH20-8 cmH20] 5 cmH20 Pressure Support:  [15 cmH20] 15 cmH20 Plateau Pressure:  [20 cmH20-23 cmH20] 20 cmH20   Intake/Output Summary  (Last 24 hours) at 01/25/2023 0739 Last data filed at 01/25/2023 0451 Gross per 24 hour  Intake 1920.61 ml  Output 2275 ml  Net -354.39 ml   Filed Weights   01/23/23 0219 01/24/23 0400 01/25/23 0500  Weight: 76.5 kg 69.2 kg 66.5 kg   Physical Exam: General: Chronically ill-appearing, no acute distress HENT: [place], AT, ETT in place Eyes: PERRL, no scleral icterus Respiratory: Diminished but clear to auscultation bilaterally.  No crackles, wheezing or rales Cardiovascular: RRR, -M/R/G, no JVD GI: BS+, soft, nontender Extremities: Trace pedal edema,-tenderness Neuro: Sedated, PERRL GU: Foley in place   Resolved Hospital Problem list   N/a   Assessment & Plan:   Severe ARDS  due to viral PNA (influenza A) and MRSA pneumonia Mild Asthma Hx  PF ratio improved. On minimal vent settings CXR 12/27 with bilateral infiltrates, improved after diuresis ABG reviewed with alkalosis P:  Extubated 12/26 and reintubated 12/28 Full vent support LTVV, 4-8cc/kg IBW with goal Pplat<30 and DP<15 Reduce RR 20 Completed Tamiflu. End date 12/27 Continue Vanc Consider diuresis this PM VAP PAD protocol Added Klonopin 12/28  Septic shock 2/2 Staph pneumonia superimposed on influenza Sedation related hypotension Possible colitis seen on CT abdominal scan-low suspicion  Echo EF 50-55%, RV and LF low normal function, no regional wall abnormalities  Intermittently on and off levophed. Currently off P: Continue vanc Day 6 Continue stress dose steroids BID  Leukopenia resolved Thrombocytopenia in setting of Septic shock - improving No signs of bleeding P: Continue to trend CBC daily, monitor for signs of bleeding Transfuse if hgb <  7  Continue to hold subQ heparin. Consider restarting if remains >50K tomorrow  Fluid Electrolyte imbalance  Hypokalemia Hypophosphatemia  Hypocalcemic  P:  Replete Continue to trend BMET this PM and daily  DM-2 CBGs stable  P: Continue CBG q 4 Continue  SSI Restart TF  HTN P: Continue to hold antihypertensives  Dyslipidemia P: Continue Statin per tube   Hypothyroidism P:  Restart levothyroxine when appropriate   GERD P:  Continue PPI daily   Tobacco smoking (30 PY)  P:  Counseled on smoking cessation Start nicotine patch  Anxiety/Depression P: Restart wellbutrin when able On valium 5 mg TID PRN at home Started klonopin 1 mg TID 12/28  Latex Allergy hx Latex foley removed Perineal swelling improving  P: Continue to use non-latex catheters and products     Best Practice (right click and "Reselect all SmartList Selections" daily)   Diet/type:  Restart TF DVT prophylaxis  heparin on hold, SCDs. Restart tomorrow if remains >50K tomorrow Pressure ulcer(s): N/A GI prophylaxis: PPI On at home Lines: CVC 12/23  Foley:  Yes, and it is still needed Code Status:  full code Last date of multidisciplinary goals of care discussion updated husband at beside 12/26   Discussed risk of reintubation in the future and potential need for tracheostomy if anxiety remains an issue. Plan to continue current care for now and optimize sedating/anxiolytics and potential extubation on Monday.   Critical care time: 45 min    The patient is critically ill with multiple organ systems failure and requires high complexity decision making for assessment and support, frequent evaluation and titration of therapies, application of advanced monitoring technologies and extensive interpretation of multiple databases.  Independent Critical Care Time: 45 Minutes.   Mechele Collin, M.D. [facility] Pulmonary/Critical Care Medicine 01/25/2023 8:00 AM   Please see Amion for pager number to reach on-call Pulmonary and Critical Care Team.

## 2023-01-25 NOTE — Progress Notes (Signed)
PT Cancellation Note  Patient Details Name: Sabrina Roberson MRN: [id number] DOB: November 29, 1970   Cancelled Treatment:    Reason Eval/Treat Not Completed: Medical issues which prohibited therapy, Patient now on ventilator. Blanchard Kelch PT Acute Rehabilitation Services Office [phone] Weekend pager-[phone]     Rada Hay 01/25/2023, 7:21 AM

## 2023-01-25 NOTE — Progress Notes (Signed)
Nutrition Brief Note  Patient remains intubated on vent support. Last full RD note on 12/27 with nutrition recommendations as follows:  INTERVENTION:  - Once medically appropriate to start tube feeds and new OGT placed, would recommend: Vital 1.5 at 50 ml/h (1200 ml per day) *Would recommend starting at 84mL/hr and advancing by 10mL Q12H Prosource TF20 60 ml daily Provides 1880 kcal, 101 gm protein, 917 ml free water daily   - Monitor magnesium, potassium, and phosphorus BID for at least 3 days, MD to replete as needed, as pt is at risk for refeeding syndrome.  NG/OG now in place. Abd xray confirms tip within gastric fundus.  Will place TF orders and continue to monitor tolerance versus extubation and ability to advance diet.   Drusilla Kanner, RDN, LDN Clinical Nutrition

## 2023-01-25 NOTE — Progress Notes (Signed)
PCCM Progress Note  Patient A-line due for removal however wide difference in cuff pressures and A-line readings. Remains on vasopressor support. Will continue line for additional 24 hours and reassess.

## 2023-01-26 ENCOUNTER — Inpatient Hospital Stay (HOSPITAL_COMMUNITY): Payer: Medicare Other

## 2023-01-26 DIAGNOSIS — A419 Sepsis, unspecified organism: Secondary | ICD-10-CM | POA: Diagnosis not present

## 2023-01-26 DIAGNOSIS — R6521 Severe sepsis with septic shock: Secondary | ICD-10-CM | POA: Diagnosis not present

## 2023-01-26 LAB — BLOOD GAS, ARTERIAL
Acid-Base Excess: 12.3 mmol/L — ABNORMAL HIGH (ref 0.0–2.0)
Bicarbonate: 36.7 mmol/L — ABNORMAL HIGH (ref 20.0–28.0)
Drawn by: 59133
FIO2: 40 %
MECHVT: 430 mL
O2 Saturation: 96 %
PEEP: 5 cmH2O
Patient temperature: 37.8
RATE: 20 {breaths}/min
pCO2 arterial: 47 mm[Hg] (ref 32–48)
pH, Arterial: 7.51 — ABNORMAL HIGH (ref 7.35–7.45)
pO2, Arterial: 84 mm[Hg] (ref 83–108)

## 2023-01-26 LAB — BASIC METABOLIC PANEL
Anion gap: 9 (ref 5–15)
BUN: 24 mg/dL — ABNORMAL HIGH (ref 6–20)
CO2: 33 mmol/L — ABNORMAL HIGH (ref 22–32)
Calcium: 7.5 mg/dL — ABNORMAL LOW (ref 8.9–10.3)
Chloride: 100 mmol/L (ref 98–111)
Creatinine, Ser: 0.8 mg/dL (ref 0.44–1.00)
GFR, Estimated: >60 mL/min (ref >60)
Glucose, Bld: 137 mg/dL — ABNORMAL HIGH (ref 70–99)
Potassium: 3.2 mmol/L — ABNORMAL LOW (ref 3.5–5.1)
Sodium: 142 mmol/L (ref 135–145)

## 2023-01-26 LAB — CBC
HCT: 28.4 % — ABNORMAL LOW (ref 36.0–46.0)
Hemoglobin: 8.8 g/dL — ABNORMAL LOW (ref 12.0–15.0)
MCH: 28.5 pg (ref 26.0–34.0)
MCHC: 31 g/dL (ref 30.0–36.0)
MCV: 91.9 fL (ref 80.0–100.0)
Platelets: 88 10*3/uL — ABNORMAL LOW (ref 150–400)
RBC: 3.09 MIL/uL — ABNORMAL LOW (ref 3.87–5.11)
RDW: 14.6 % (ref 11.5–15.5)
WBC: 14.9 10*3/uL — ABNORMAL HIGH (ref 4.0–10.5)
nRBC: 0.3 % — ABNORMAL HIGH (ref 0.0–0.2)

## 2023-01-26 LAB — GLUCOSE, CAPILLARY
Glucose-Capillary: 131 mg/dL — ABNORMAL HIGH (ref 70–99)
Glucose-Capillary: 131 mg/dL — ABNORMAL HIGH (ref 70–99)
Glucose-Capillary: 136 mg/dL — ABNORMAL HIGH (ref 70–99)
Glucose-Capillary: 154 mg/dL — ABNORMAL HIGH (ref 70–99)
Glucose-Capillary: 166 mg/dL — ABNORMAL HIGH (ref 70–99)
Glucose-Capillary: 166 mg/dL — ABNORMAL HIGH (ref 70–99)

## 2023-01-26 LAB — POTASSIUM: Potassium: 3 mmol/L — ABNORMAL LOW (ref 3.5–5.1)

## 2023-01-26 LAB — PHOSPHORUS
Phosphorus: 2.7 mg/dL (ref 2.5–4.6)
Phosphorus: 2.8 mg/dL (ref 2.5–4.6)

## 2023-01-26 LAB — MAGNESIUM
Magnesium: 2 mg/dL (ref 1.7–2.4)
Magnesium: 2.1 mg/dL (ref 1.7–2.4)

## 2023-01-26 MED ORDER — POTASSIUM CHLORIDE 10 MEQ/50ML IV SOLN
10.0000 meq | INTRAVENOUS | Status: AC
  Administered 2023-01-26 (×4): 10 meq via INTRAVENOUS
  Filled 2023-01-26 (×3): qty 50

## 2023-01-26 MED ORDER — HEPARIN SODIUM (PORCINE) 5000 UNIT/ML IJ SOLN
5000.0000 [IU] | Freq: Three times a day (TID) | INTRAMUSCULAR | Status: DC
  Administered 2023-01-26 – 2023-02-05 (×31): 5000 [IU] via SUBCUTANEOUS
  Filled 2023-01-26 (×31): qty 1

## 2023-01-26 MED ORDER — POTASSIUM CHLORIDE 20 MEQ PO PACK
20.0000 meq | PACK | ORAL | Status: AC
  Administered 2023-01-26 (×2): 20 meq
  Filled 2023-01-26 (×2): qty 1

## 2023-01-26 MED ORDER — OXYCODONE HCL 5 MG PO TABS
5.0000 mg | ORAL_TABLET | Freq: Two times a day (BID) | ORAL | Status: DC
  Administered 2023-01-26 – 2023-01-27 (×4): 5 mg
  Filled 2023-01-26 (×4): qty 1

## 2023-01-26 MED ORDER — OXYCODONE HCL ER 10 MG PO T12A: 10.0000 mg | EXTENDED_RELEASE_TABLET | Freq: Two times a day (BID) | ORAL | Status: DC

## 2023-01-26 MED ORDER — ACETAZOLAMIDE SODIUM 500 MG IJ SOLR
500.0000 mg | Freq: Once | INTRAMUSCULAR | Status: AC
  Administered 2023-01-26: 500 mg via INTRAVENOUS
  Filled 2023-01-26: qty 500

## 2023-01-26 MED ORDER — LINEZOLID 600 MG/300ML IV SOLN
600.0000 mg | Freq: Two times a day (BID) | INTRAVENOUS | Status: AC
  Administered 2023-01-26 – 2023-02-01 (×14): 600 mg via INTRAVENOUS
  Filled 2023-01-26 (×17): qty 300

## 2023-01-26 MED ORDER — HYDROCORTISONE SOD SUC (PF) 100 MG IJ SOLR
50.0000 mg | Freq: Every day | INTRAMUSCULAR | Status: DC
  Administered 2023-01-27 – 2023-01-28 (×2): 50 mg via INTRAVENOUS
  Filled 2023-01-26 (×2): qty 2

## 2023-01-26 NOTE — Progress Notes (Signed)
ABG collected and send to lab for analysis. Lab notified.

## 2023-01-26 NOTE — Plan of Care (Signed)
°  Problem: Education: Goal: Knowledge of General Education information will improve Description: Including pain rating scale, medication(s)/side effects and non-pharmacologic comfort measures Outcome: Progressing   Problem: Health Behavior/Discharge Planning: Goal: Ability to manage health-related needs will improve Outcome: Progressing   Problem: Clinical Measurements: Goal: Ability to maintain clinical measurements within normal limits will improve Outcome: Progressing Goal: Will remain free from infection Outcome: Progressing Goal: Diagnostic test results will improve Outcome: Progressing Goal: Respiratory complications will improve Outcome: Progressing Goal: Cardiovascular complication will be avoided Outcome: Progressing   Problem: Activity: Goal: Risk for activity intolerance will decrease Outcome: Progressing   Problem: Nutrition: Goal: Adequate nutrition will be maintained Outcome: Progressing   Problem: Coping: Goal: Level of anxiety will decrease Outcome: Progressing   Problem: Elimination: Goal: Will not experience complications related to bowel motility Outcome: Progressing Goal: Will not experience complications related to urinary retention Outcome: Progressing   Problem: Pain Management: Goal: General experience of comfort will improve Outcome: Progressing   Problem: Safety: Goal: Ability to remain free from injury will improve Outcome: Progressing   Problem: Skin Integrity: Goal: Risk for impaired skin integrity will decrease Outcome: Progressing   Problem: Coping: Goal: Ability to adjust to condition or change in health will improve Outcome: Progressing   Problem: Fluid Volume: Goal: Ability to maintain a balanced intake and output will improve Outcome: Progressing   Problem: Metabolic: Goal: Ability to maintain appropriate glucose levels will improve Outcome: Progressing   Problem: Nutritional: Goal: Maintenance of adequate nutrition  will improve Outcome: Progressing Goal: Progress toward achieving an optimal weight will improve Outcome: Progressing   Problem: Skin Integrity: Goal: Risk for impaired skin integrity will decrease Outcome: Progressing   Problem: Tissue Perfusion: Goal: Adequacy of tissue perfusion will improve Outcome: Progressing   Problem: Activity: Goal: Ability to tolerate increased activity will improve Outcome: Progressing   Problem: Respiratory: Goal: Ability to maintain a clear airway and adequate ventilation will improve Outcome: Progressing   Problem: Role Relationship: Goal: Method of communication will improve Outcome: Progressing   Problem: Safety: Goal: Non-violent Restraint(s) Outcome: Completed/Met   Cindy S. Clelia Croft BSN, RN, CCRP, CCRN 01/26/2023 6:36 AM

## 2023-01-26 NOTE — TOC Progression Note (Signed)
Transition of Care [facility]) - Progression Note    Patient Details  Name: Sabrina Roberson MRN: [id number] Date of Birth: July 31, 1970  Transition of [facility] [place]) CM/SW Contact  Adrian Prows, RN Phone Number: 01/26/2023, 1:33 PM  Clinical Narrative:    Pt sedated on ventilator; unable to complete TOC assessment; will follow for needs.     Barriers to Discharge: Continued Medical Work up  Expected Discharge Plan and Services                                               Social Determinants of Health (SDOH) Interventions SDOH Screenings   Food Insecurity: Patient Unable To Answer (01/19/2023)  Housing: Patient Unable To Answer (01/19/2023)  Transportation Needs: Patient Unable To Answer (01/19/2023)  Utilities: Not At Risk (01/19/2023)  Alcohol Screen: Low Risk  (10/10/2022)  Depression (PHQ2-9): Medium Risk (12/02/2022)  Financial Resource Strain: Low Risk  (12/02/2022)  Physical Activity: Unknown (12/02/2022)  Recent Concern: Physical Activity - Insufficiently Active (10/10/2022)  Social Connections: Unknown (12/02/2022)  Recent Concern: Social Connections - Moderately Isolated (10/10/2022)  Stress: Stress Concern Present (12/02/2022)  Tobacco Use: High Risk (01/19/2023)  Health Literacy: Adequate Health Literacy (10/10/2022)    Readmission Risk Interventions    01/22/2023    8:55 AM  Readmission Risk Prevention Plan  Transportation Screening Complete  HRI or Home Care Consult Complete  Social Work Consult for Recovery Care Planning/Counseling Complete  Medication Review Oceanographer) Complete

## 2023-01-26 NOTE — Progress Notes (Addendum)
NAME:  Sabrina Roberson, MRN:  [id number], DOB:  [birth year], LOS: 7 ADMISSION DATE:  01/19/2023, CONSULTATION DATE: 01/19/2023 REFERRING MD: Royanne Foots, DO, CHIEF COMPLAINT: AMS and hypoxia today  History of Present Illness:  A 52 y.o. female patient with mild asthma on Albuterol PRN, DM-2, HTN, dyslipidemia, hypothyroidism, GERD, and tobacco smoking (1 ppd for 30 yrs) who presented to ED with AMS, hypoxia 48% on RA, SOB, DOE, for dry cough for the last 2 days. Today he noted her to be confused and very somnolent while sitting on the couch with labored breathing. Regarding sick contacts, her husband notes the patient was watching a grandchild with recent viral URI symptoms earlier this week. No F/c/r, N/V/D, abd pain, rash, CP, illicit drug use, or alcohol intake.    Pertinent  Medical History  Mild asthma on Albuterol PRN, DM-2, HTN, dyslipidemia, hypothyroidism, GERD  Significant Hospital Events: Including procedures, antibiotic start and stop dates in addition to other pertinent events   12/22 Admit with ARDS secondary to Influenza A PNA 12/23 Worsening ARDS, septic shock  12/24 Fio2 and PEEP requirements decreasing along with pressors requirements, flagyl dc'd  12/25 MRSA in tracheal aspirate, on low dose pressors, azithromycin dc'd  12/26 Extubated 12/28 Re-intubated for respiratory distress/anxiety  Interim History / Subjective:   Increased secretions today. WBC increased. CXR slightly increased infiltrates L>R Objective   Blood pressure 114/64, pulse 60, temperature 99.3 F (37.4 C), temperature source Axillary, resp. rate 19, height 5\' 4"  (1.626 m), weight 67.9 kg, SpO2 96%.    Vent Mode: PRVC FiO2 (%):  [30 %-50 %] 40 % Set Rate:  [20 bmp] 20 bmp Vt Set:  [430 mL] 430 mL PEEP:  [5 cmH20] 5 cmH20 Plateau Pressure:  [19 cmH20-20 cmH20] 20 cmH20   Intake/Output Summary (Last 24 hours) at 01/26/2023 0745 Last data filed at 01/26/2023 8841 Gross per 24 hour  Intake  2569.61 ml  Output 837 ml  Net 1732.61 ml   Filed Weights   01/24/23 0400 01/25/23 0500 01/26/23 0414  Weight: 69.2 kg 66.5 kg 67.9 kg   Physical Exam: General: Chronically ill-appearing, no acute distress HENT: [place], AT, ETT in place Eyes: EOMI, no scleral icterus Respiratory: Rhonchi bilaterally.  No wheezing or rales Cardiovascular: RRR, -M/R/G, no JVD GI: BS+, soft, nontender Extremities: Trace pedal edema,-tenderness Neuro: Sedated, eyes spontaneously open, does not follow commands GU: Foley in place  K 3.2 WBC 14.9 increased  Resolved Hospital Problem list   N/a   Assessment & Plan:   Severe ARDS  due to viral PNA (influenza A) and MRSA pneumonia Mild Asthma Hx  Increased leukocytosis, worsening secretions and increased infiltrates on CXR. Possible pulmonary edema P:  Extubated 12/26 and reintubated 12/28 Full vent support LTVV, 4-8cc/kg IBW with goal Pplat<30 and DP<15 Completed Tamiflu. End date 12/27 Escalate to Zyvox 12/29 Repeat trach cx for increased secretions Diurese VAP PAD protocol Added Klonopin 12/28 Added oxycodone 12/29  Septic shock 2/2 Staph pneumonia superimposed on influenza Sedation related hypotension Possible colitis seen on CT abdominal scan-low suspicion  Echo EF 50-55%, RV and LF low normal function, no regional wall abnormalities  Intermittently on and off levophed. Currently off P: Continue vanc Day 7 Reduce stress dose steroids to daily  Leukopenia resolved Thrombocytopenia in setting of Septic shock - improving No signs of bleeding P: Continue to trend CBC daily, monitor for signs of bleeding Transfuse if hgb < 7  Continue to hold subQ heparin. Consider restarting if remains >  50K tomorrow  Fluid Electrolyte imbalance  Hypokalemia Hypophosphatemia  Hypocalcemic  P:  Replete K Continue to trend BMET this PM and daily  DM-2 CBGs stable  P: Continue CBG q 4 Continue SSI Restart TF  HTN P: Continue to hold  antihypertensives  Dyslipidemia P: Continue Statin per tube   Hypothyroidism P:  Levothyroxine per tube  GERD P:  Continue PPI daily   Tobacco smoking (30 PY)  P:  Counseled on smoking cessation Start nicotine patch  Anxiety/Depression P: Restart wellbutrin when able On valium 5 mg TID PRN at home Started klonopin 1 mg TID 12/28  Latex Allergy hx Latex foley removed Perineal swelling improving  P: Continue to use non-latex catheters and products     Best Practice (right click and "Reselect all SmartList Selections" daily)   Diet/type: TF DVT prophylaxis  heparin subcutaneous Pressure ulcer(s): N/A GI prophylaxis: PPI On at home Lines: CVC 12/23  Foley:  Yes, and it is still needed Code Status:  full code Last date of multidisciplinary goals of care discussion updated husband at beside 12/29  12/28 Discussed risk of reintubation in the future and potential need for tracheostomy if anxiety remains an issue. Plan to continue current care for now and optimize sedating/anxiolytics and potential extubation on Monday.   Critical care time: 46 min    The patient is critically ill with multiple organ systems failure and requires high complexity decision making for assessment and support, frequent evaluation and titration of therapies, application of advanced monitoring technologies and extensive interpretation of multiple databases.    Mechele Collin, M.D. [facility] Pulmonary/Critical Care Medicine 01/26/2023 7:55 AM   Please see Amion for pager number to reach on-call Pulmonary and Critical Care Team.

## 2023-01-26 NOTE — Plan of Care (Signed)

## 2023-01-27 ENCOUNTER — Inpatient Hospital Stay (HOSPITAL_COMMUNITY): Payer: Medicare Other

## 2023-01-27 DIAGNOSIS — R6521 Severe sepsis with septic shock: Secondary | ICD-10-CM | POA: Diagnosis not present

## 2023-01-27 DIAGNOSIS — A419 Sepsis, unspecified organism: Secondary | ICD-10-CM | POA: Diagnosis not present

## 2023-01-27 LAB — BASIC METABOLIC PANEL
Anion gap: 5 (ref 5–15)
Anion gap: 5 (ref 5–15)
BUN: 15 mg/dL (ref 6–20)
BUN: 15 mg/dL (ref 6–20)
CO2: 30 mmol/L (ref 22–32)
CO2: 33 mmol/L — ABNORMAL HIGH (ref 22–32)
Calcium: 7.3 mg/dL — ABNORMAL LOW (ref 8.9–10.3)
Calcium: 7.7 mg/dL — ABNORMAL LOW (ref 8.9–10.3)
Chloride: 99 mmol/L (ref 98–111)
Chloride: 100 mmol/L (ref 98–111)
Creatinine, Ser: 0.64 mg/dL (ref 0.44–1.00)
Creatinine, Ser: 0.71 mg/dL (ref 0.44–1.00)
GFR, Estimated: >60 mL/min (ref >60)
GFR, Estimated: >60 mL/min (ref >60)
Glucose, Bld: 137 mg/dL — ABNORMAL HIGH (ref 70–99)
Glucose, Bld: 121 mg/dL — ABNORMAL HIGH (ref 70–99)
Potassium: 2.8 mmol/L — ABNORMAL LOW (ref 3.5–5.1)
Potassium: 4.1 mmol/L (ref 3.5–5.1)
Sodium: 134 mmol/L — ABNORMAL LOW (ref 135–145)
Sodium: 138 mmol/L (ref 135–145)

## 2023-01-27 LAB — BLOOD GAS, ARTERIAL
Acid-Base Excess: 5.8 mmol/L — ABNORMAL HIGH (ref 0.0–2.0)
Bicarbonate: 31.9 mmol/L — ABNORMAL HIGH (ref 20.0–28.0)
Drawn by: 331471
FIO2: 40 %
MECHVT: 430 mL
O2 Saturation: 94.8 %
Patient temperature: 37
RATE: 20 {breaths}/min
pCO2 arterial: 54 mm[Hg] — ABNORMAL HIGH (ref 32–48)
pH, Arterial: 7.38 (ref 7.35–7.45)
pO2, Arterial: 72 mm[Hg] — ABNORMAL LOW (ref 83–108)

## 2023-01-27 LAB — CBC
HCT: 29.7 % — ABNORMAL LOW (ref 36.0–46.0)
Hemoglobin: 9.3 g/dL — ABNORMAL LOW (ref 12.0–15.0)
MCH: 29.5 pg (ref 26.0–34.0)
MCHC: 31.3 g/dL (ref 30.0–36.0)
MCV: 94.3 fL (ref 80.0–100.0)
Platelets: 139 10*3/uL — ABNORMAL LOW (ref 150–400)
RBC: 3.15 MIL/uL — ABNORMAL LOW (ref 3.87–5.11)
RDW: 14.5 % (ref 11.5–15.5)
WBC: 19.8 10*3/uL — ABNORMAL HIGH (ref 4.0–10.5)
nRBC: 0.2 % (ref 0.0–0.2)

## 2023-01-27 LAB — GLUCOSE, CAPILLARY
Glucose-Capillary: 107 mg/dL — ABNORMAL HIGH (ref 70–99)
Glucose-Capillary: 126 mg/dL — ABNORMAL HIGH (ref 70–99)
Glucose-Capillary: 134 mg/dL — ABNORMAL HIGH (ref 70–99)
Glucose-Capillary: 139 mg/dL — ABNORMAL HIGH (ref 70–99)
Glucose-Capillary: 140 mg/dL — ABNORMAL HIGH (ref 70–99)
Glucose-Capillary: 140 mg/dL — ABNORMAL HIGH (ref 70–99)

## 2023-01-27 MED ORDER — POTASSIUM CHLORIDE 20 MEQ PO PACK
40.0000 meq | PACK | Freq: Once | ORAL | Status: AC
Start: 2023-01-27 — End: 2023-01-27
  Administered 2023-01-27: 40 meq
  Filled 2023-01-27: qty 2

## 2023-01-27 MED ORDER — FUROSEMIDE 10 MG/ML IJ SOLN
40.0000 mg | Freq: Once | INTRAMUSCULAR | Status: AC
Start: 2023-01-27 — End: 2023-01-27
  Administered 2023-01-27: 40 mg via INTRAVENOUS
  Filled 2023-01-27: qty 4

## 2023-01-27 MED ORDER — POTASSIUM CHLORIDE 10 MEQ/50ML IV SOLN
10.0000 meq | INTRAVENOUS | Status: AC
  Administered 2023-01-27 (×4): 10 meq via INTRAVENOUS
  Filled 2023-01-27 (×4): qty 50

## 2023-01-27 MED ORDER — CLONAZEPAM 1 MG PO TABS
2.0000 mg | ORAL_TABLET | Freq: Three times a day (TID) | ORAL | Status: DC
  Administered 2023-01-27 – 2023-01-30 (×10): 2 mg
  Filled 2023-01-27 (×10): qty 2

## 2023-01-27 MED ORDER — FUROSEMIDE 10 MG/ML IJ SOLN
40.0000 mg | Freq: Once | INTRAMUSCULAR | Status: AC
  Administered 2023-01-27: 40 mg via INTRAVENOUS
  Filled 2023-01-27: qty 4

## 2023-01-27 NOTE — Progress Notes (Signed)
[facility] ADULT ICU REPLACEMENT PROTOCOL   The patient does apply for the [facility] Adult ICU Electrolyte Replacment Protocol based on the criteria listed below:   1.Exclusion criteria: TCTS, ECMO, Dialysis, and Myasthenia Gravis patients 2. Is GFR >/= 30 ml/min? Yes.    Patient's GFR today is > 60 3. Is SCr </= 2? Yes.   Patient's SCr is 0.64 mg/dL 4. Did SCr increase >/= 0.5 in 24 hours? No. 5.Pt's weight >40kg  Yes.   6. Abnormal electrolyte(s): K+ 2.8  7. Electrolytes replaced per protocol 8.  Call MD STAT for K+ </= 2.5, Phos </= 1, or Mag </= 1 Physician:  Dr Valrie Hart, Jettie Booze 01/27/2023 7:02 AM

## 2023-01-27 NOTE — Plan of Care (Signed)
  Problem: Education: Goal: Knowledge of General Education information will improve Description: Including pain rating scale, medication(s)/side effects and non-pharmacologic comfort measures Outcome: Progressing   Problem: Health Behavior/Discharge Planning: Goal: Ability to manage health-related needs will improve Outcome: Progressing   Problem: Clinical Measurements: Goal: Ability to maintain clinical measurements within normal limits will improve Outcome: Progressing Goal: Will remain free from infection Outcome: Progressing Goal: Diagnostic test results will improve Outcome: Progressing Goal: Respiratory complications will improve Outcome: Progressing Goal: Cardiovascular complication will be avoided Outcome: Progressing   Problem: Activity: Goal: Risk for activity intolerance will decrease Outcome: Progressing   Problem: Nutrition: Goal: Adequate nutrition will be maintained Outcome: Progressing   Problem: Coping: Goal: Level of anxiety will decrease Outcome: Progressing   Problem: Elimination: Goal: Will not experience complications related to bowel motility Outcome: Progressing Goal: Will not experience complications related to urinary retention Outcome: Progressing   Problem: Pain Management: Goal: General experience of comfort will improve Outcome: Progressing   Problem: Safety: Goal: Ability to remain free from injury will improve Outcome: Progressing   Problem: Skin Integrity: Goal: Risk for impaired skin integrity will decrease Outcome: Progressing   Problem: Education: Goal: Ability to describe self-care measures that may prevent or decrease complications (Diabetes Survival Skills Education) will improve Outcome: Progressing   Problem: Coping: Goal: Ability to adjust to condition or change in health will improve Outcome: Progressing   Problem: Fluid Volume: Goal: Ability to maintain a balanced intake and output will improve Outcome:  Progressing   Problem: Health Behavior/Discharge Planning: Goal: Ability to identify and utilize available resources and services will improve Outcome: Progressing Goal: Ability to manage health-related needs will improve Outcome: Progressing   Problem: Metabolic: Goal: Ability to maintain appropriate glucose levels will improve Outcome: Progressing   Problem: Nutritional: Goal: Maintenance of adequate nutrition will improve Outcome: Progressing Goal: Progress toward achieving an optimal weight will improve Outcome: Progressing   Problem: Skin Integrity: Goal: Risk for impaired skin integrity will decrease Outcome: Progressing   Problem: Tissue Perfusion: Goal: Adequacy of tissue perfusion will improve Outcome: Progressing   Problem: Activity: Goal: Ability to tolerate increased activity will improve Outcome: Progressing   Problem: Respiratory: Goal: Ability to maintain a clear airway and adequate ventilation will improve Outcome: Progressing   Problem: Role Relationship: Goal: Method of communication will improve Outcome: Progressing  Cindy S. Clelia Croft BSN, RN, [employer], CCRN 01/27/2023 6:23 AM

## 2023-01-27 NOTE — Plan of Care (Signed)
  Problem: Clinical Measurements: Goal: Ability to maintain clinical measurements within normal limits will improve Outcome: Not Met (add Reason) Goal: Will remain free from infection Outcome: Not Met (add Reason) Goal: Diagnostic test results will improve Outcome: Not Met (add Reason) Goal: Respiratory complications will improve Outcome: Not Met (add Reason) Goal: Cardiovascular complication will be avoided Outcome: Not Met (add Reason)   Problem: Nutrition: Goal: Adequate nutrition will be maintained Outcome: Not Met (add Reason)   Problem: Activity: Goal: Risk for activity intolerance will decrease Outcome: Not Met (add Reason)   Problem: Coping: Goal: Level of anxiety will decrease Outcome: Not Met (add Reason)

## 2023-01-27 NOTE — Progress Notes (Signed)
Chaplain met with Sabrina Roberson's mother, Sabrina Roberson, and husband, Sabrina Roberson who were at bedside.  They shared some about Sabrina Roberson and about the family.  Sabrina Roberson has 2 sons and 2 grandchildren who are young.  Chaplain provided emotional support through active listening.  Family requested prayer, which chaplain provided.  [phone] [place] Pager, [phone]

## 2023-01-27 NOTE — Progress Notes (Signed)
SLP Cancellation Note  Patient Details Name: Sabrina Roberson MRN: [id number] DOB: 10-Apr-1970   Cancelled treatment:       Reason Eval/Treat Not Completed: Medical issues which prohibited therapy Patient remains on vent, sedated, not following commands and with increased secretions. SLP will continue to follow for readiness.  Angela Nevin, MA, CCC-SLP Speech Therapy

## 2023-01-27 NOTE — Progress Notes (Signed)
PT Cancellation Note  Patient Details Name: Sabrina Roberson MRN: [id number] DOB: 1970-04-22   Cancelled Treatment:    Reason Eval/Treat Not Completed: Patient not medically ready-on vent.     Faye Ramsay, PT Acute Rehabilitation  Office: [phone]

## 2023-01-27 NOTE — Progress Notes (Signed)
NAME:  Sabrina Roberson, MRN:  [id number], DOB:  Jan 10, 1971, LOS: 8 ADMISSION DATE:  01/19/2023, CONSULTATION DATE: 01/19/2023 REFERRING MD: Royanne Foots, DO, CHIEF COMPLAINT: AMS and hypoxia today  History of Present Illness:  A 52 y.o. female patient with mild asthma on Albuterol PRN, DM-2, HTN, dyslipidemia, hypothyroidism, GERD, and tobacco smoking (1 ppd for 30 yrs) who presented to ED with AMS, hypoxia 48% on RA, SOB, DOE, for dry cough for the last 2 days. Today he noted her to be confused and very somnolent while sitting on the couch with labored breathing. Regarding sick contacts, her husband notes the patient was watching a grandchild with recent viral URI symptoms earlier this week. No F/c/r, N/V/D, abd pain, rash, CP, illicit drug use, or alcohol intake.    Pertinent  Medical History  Mild asthma on Albuterol PRN, DM-2, HTN, dyslipidemia, hypothyroidism, GERD  Significant Hospital Events: Including procedures, antibiotic start and stop dates in addition to other pertinent events   12/22 Admit with ARDS secondary to Influenza A PNA 12/23 Worsening ARDS, septic shock  12/24 Fio2 and PEEP requirements decreasing along with pressors requirements, flagyl dc'd  12/25 MRSA in tracheal aspirate, on low dose pressors, azithromycin dc'd  12/26 Extubated 12/28 Re-intubated for respiratory distress/anxiety  Interim History / Subjective:   Tolerating diuresis Remains agitated on propofol and fentanyl On minimal vent settings. Less secretions but had episodes of intermittent hypoxemia overnight  Objective   Blood pressure (!) 104/44, pulse 61, temperature 98.6 F (37 C), temperature source Axillary, resp. rate 15, height 5\' 4"  (1.626 m), weight 69.9 kg, SpO2 93%.    Vent Mode: PRVC FiO2 (%):  [40 %] 40 % Set Rate:  [20 bmp] 20 bmp Vt Set:  [430 mL] 430 mL PEEP:  [5 cmH20] 5 cmH20 Plateau Pressure:  [14 cmH20-23 cmH20] 18 cmH20   Intake/Output Summary (Last 24 hours) at  01/27/2023 0821 Last data filed at 01/27/2023 0809 Gross per 24 hour  Intake 3121.64 ml  Output 1460 ml  Net 1661.64 ml   Filed Weights   01/25/23 0500 01/26/23 0414 01/27/23 0500  Weight: 66.5 kg 67.9 kg 69.9 kg    Physical Exam: General: Chronically ill-appearing, no acute distress HENT: [place], AT, ETT in place Eyes: PERRL, EOMI, no scleral icterus Respiratory: Diminished to auscultation bilaterally.  No crackles, wheezing or rales Cardiovascular: RRR, -M/R/G, no JVD GI: BS+, soft, nontender Extremities: 2+ nonpitting edema,-tenderness Neuro: Agitated, CNII-XII grossly intact GU: Foley in place   K 2.8 WBC 19.8 increased  Resolved Hospital Problem list   Leukopenia resolved Assessment & Plan:   Severe ARDS  due to viral PNA (influenza A) and MRSA pneumonia Mild Asthma Hx  Increased leukocytosis, worsening secretions and increased infiltrates on CXR. Possible pulmonary edema P:  Extubated 12/26 and reintubated 12/28 Full vent support LTVV, 4-8cc/kg IBW with goal Pplat<30 and DP<15 Completed Tamiflu. End date 12/27 Escalate to Zyvox 12/29 F/u repeat trach cx for increased secretions Diurese Daily WUA/SBT. No plans for extubation due to tenuous respiratory status VAP PAD protocol Added Klonopin 12/28. Increased to 2 mg TID Added oxycodone 12/29 CXR today  Septic shock 2/2 Staph pneumonia superimposed on influenza Sedation related hypotension Possible colitis seen on CT abdominal scan-low suspicion  Echo EF 50-55%, RV and LF low normal function, no regional wall abnormalities  Intermittently on and off levophed. Currently off P: S/p Vanc x 7 days Linezolid 12/29 Stress dose steroids daily  Thrombocytopenia in setting of Septic shock -  resolved No signs of bleeding P: Continue to trend CBC daily, monitor for signs of bleeding Transfuse if hgb < 7   Fluid Electrolyte imbalance  Hypokalemia Hypophosphatemia  Hypocalcemic  P:  Replete K Trend BMET this PM  and daily  DM-2 CBGs stable  P: Continue CBG q 4 Continue SSI On TF  HTN P: Continue to hold antihypertensives  Dyslipidemia P: Continue Statin per tube   Hypothyroidism P:  Levothyroxine per tube  GERD P:  Continue PPI daily   Tobacco smoking (30 PY)  P:  Counseled on smoking cessation Start nicotine patch  Anxiety/Depression P: Restart wellbutrin when able On valium 5 mg TID PRN at home Started klonopin 12/28  Latex Allergy hx Latex foley removed Perineal swelling improving  P: Continue to use non-latex catheters and products     Best Practice (right click and "Reselect all SmartList Selections" daily)   Diet/type: TF DVT prophylaxis  heparin subcutaneous Pressure ulcer(s): N/A GI prophylaxis: PPI On at home Lines: CVC 12/23  Foley:  Yes, and it is still needed Code Status:  full code Last date of multidisciplinary goals of care discussion updated husband at beside 12/30  12/28 Discussed risk of reintubation in the future and potential need for tracheostomy if anxiety remains an issue. Plan to continue current care for now and optimize sedating/anxiolytics and potential extubation on Monday.   Critical care time: 45 min    The patient is critically ill with multiple organ systems failure and requires high complexity decision making for assessment and support, frequent evaluation and titration of therapies, application of advanced monitoring technologies and extensive interpretation of multiple databases.  Independent Critical Care Time: 45 Minutes.   Mechele Collin, M.D. [facility] Pulmonary/Critical Care Medicine 01/27/2023 8:21 AM   Please see Amion for pager number to reach on-call Pulmonary and Critical Care Team.

## 2023-01-28 DIAGNOSIS — R6521 Severe sepsis with septic shock: Secondary | ICD-10-CM | POA: Diagnosis not present

## 2023-01-28 DIAGNOSIS — A419 Sepsis, unspecified organism: Secondary | ICD-10-CM | POA: Diagnosis not present

## 2023-01-28 LAB — BASIC METABOLIC PANEL
Anion gap: 8 (ref 5–15)
Anion gap: 7 (ref 5–15)
BUN: 15 mg/dL (ref 6–20)
BUN: 14 mg/dL (ref 6–20)
CO2: 31 mmol/L (ref 22–32)
CO2: 35 mmol/L — ABNORMAL HIGH (ref 22–32)
Calcium: 7.3 mg/dL — ABNORMAL LOW (ref 8.9–10.3)
Calcium: 7.9 mg/dL — ABNORMAL LOW (ref 8.9–10.3)
Chloride: 91 mmol/L — ABNORMAL LOW (ref 98–111)
Chloride: 92 mmol/L — ABNORMAL LOW (ref 98–111)
Creatinine, Ser: 0.66 mg/dL (ref 0.44–1.00)
Creatinine, Ser: 0.67 mg/dL (ref 0.44–1.00)
GFR, Estimated: >60 mL/min (ref >60)
GFR, Estimated: >60 mL/min (ref >60)
Glucose, Bld: 116 mg/dL — ABNORMAL HIGH (ref 70–99)
Glucose, Bld: 140 mg/dL — ABNORMAL HIGH (ref 70–99)
Potassium: 2.6 mmol/L — CL (ref 3.5–5.1)
Potassium: 3.5 mmol/L (ref 3.5–5.1)
Sodium: 130 mmol/L — ABNORMAL LOW (ref 135–145)
Sodium: 134 mmol/L — ABNORMAL LOW (ref 135–145)

## 2023-01-28 LAB — GLUCOSE, CAPILLARY
Glucose-Capillary: 117 mg/dL — ABNORMAL HIGH (ref 70–99)
Glucose-Capillary: 118 mg/dL — ABNORMAL HIGH (ref 70–99)
Glucose-Capillary: 134 mg/dL — ABNORMAL HIGH (ref 70–99)
Glucose-Capillary: 121 mg/dL — ABNORMAL HIGH (ref 70–99)
Glucose-Capillary: 134 mg/dL — ABNORMAL HIGH (ref 70–99)

## 2023-01-28 LAB — CULTURE, RESPIRATORY W GRAM STAIN

## 2023-01-28 LAB — MAGNESIUM: Magnesium: 1.9 mg/dL (ref 1.7–2.4)

## 2023-01-28 LAB — CBC
HCT: 28.9 % — ABNORMAL LOW (ref 36.0–46.0)
Hemoglobin: 8.9 g/dL — ABNORMAL LOW (ref 12.0–15.0)
MCH: 28.6 pg (ref 26.0–34.0)
MCHC: 30.8 g/dL (ref 30.0–36.0)
MCV: 92.9 fL (ref 80.0–100.0)
Platelets: 187 10*3/uL (ref 150–400)
RBC: 3.11 MIL/uL — ABNORMAL LOW (ref 3.87–5.11)
RDW: 14.2 % (ref 11.5–15.5)
WBC: 21 10*3/uL — ABNORMAL HIGH (ref 4.0–10.5)
nRBC: 0.1 % (ref 0.0–0.2)

## 2023-01-28 LAB — TRIGLYCERIDES: Triglycerides: 259 mg/dL — ABNORMAL HIGH (ref <150)

## 2023-01-28 MED ORDER — POTASSIUM CHLORIDE 10 MEQ/50ML IV SOLN
10.0000 meq | INTRAVENOUS | Status: AC
  Administered 2023-01-28 (×4): 10 meq via INTRAVENOUS
  Filled 2023-01-28 (×4): qty 50

## 2023-01-28 MED ORDER — POTASSIUM CHLORIDE 20 MEQ PO PACK
40.0000 meq | PACK | Freq: Once | ORAL | Status: AC
  Administered 2023-01-28: 40 meq
  Filled 2023-01-28: qty 2

## 2023-01-28 MED ORDER — METOCLOPRAMIDE HCL 5 MG/ML IJ SOLN
5.0000 mg | Freq: Three times a day (TID) | INTRAMUSCULAR | Status: DC
  Administered 2023-01-28 – 2023-01-30 (×6): 5 mg via INTRAVENOUS
  Filled 2023-01-28 (×7): qty 2

## 2023-01-28 MED ORDER — GABAPENTIN 100 MG PO CAPS
200.0000 mg | ORAL_CAPSULE | Freq: Three times a day (TID) | ORAL | Status: DC
Start: 2023-01-28 — End: 2023-01-28

## 2023-01-28 MED ORDER — GABAPENTIN 250 MG/5ML PO SOLN
200.0000 mg | Freq: Three times a day (TID) | ORAL | Status: DC
  Administered 2023-01-28 – 2023-01-31 (×11): 200 mg
  Filled 2023-01-28 (×13): qty 4

## 2023-01-28 MED ORDER — POTASSIUM CHLORIDE CRYS ER 20 MEQ PO TBCR: 40.0000 meq | EXTENDED_RELEASE_TABLET | Freq: Once | ORAL | Status: DC

## 2023-01-28 MED ORDER — OXYCODONE HCL 5 MG PO TABS
5.0000 mg | ORAL_TABLET | ORAL | Status: DC
  Administered 2023-01-28 – 2023-01-30 (×11): 5 mg
  Filled 2023-01-28 (×12): qty 1

## 2023-01-28 MED ORDER — FUROSEMIDE 10 MG/ML IJ SOLN
40.0000 mg | Freq: Once | INTRAMUSCULAR | Status: AC
  Administered 2023-01-28: 40 mg via INTRAVENOUS
  Filled 2023-01-28: qty 4

## 2023-01-28 MED ORDER — BUPROPION HCL 100 MG PO TABS
50.0000 mg | ORAL_TABLET | Freq: Two times a day (BID) | ORAL | Status: DC
  Filled 2023-01-28: qty 0.5

## 2023-01-28 NOTE — Plan of Care (Signed)
  Problem: Activity: Goal: Risk for activity intolerance will decrease Outcome: Progressing   Problem: Nutrition: Goal: Adequate nutrition will be maintained Outcome: Progressing   Problem: Coping: Goal: Level of anxiety will decrease Outcome: Progressing   Problem: Pain Management: Goal: General experience of comfort will improve Outcome: Progressing   Problem: Skin Integrity: Goal: Risk for impaired skin integrity will decrease Outcome: Progressing   Problem: Metabolic: Goal: Ability to maintain appropriate glucose levels will improve Outcome: Progressing   Problem: Nutritional: Goal: Maintenance of adequate nutrition will improve Outcome: Progressing   Problem: Activity: Goal: Ability to tolerate increased activity will improve Outcome: Progressing   Problem: Safety: Goal: Ability to remain free from injury will improve Outcome: Not Progressing

## 2023-01-28 NOTE — Progress Notes (Signed)
 PT Cancellation Note  Patient Details Name: Sabrina Roberson MRN: [id number] DOB: [birth year]   Cancelled Treatment:    Reason Eval/Treat Not Completed: Medical issues which prohibited therapy, remains on ventilator. Will check back on1/2/25. [name] PT Acute Rehabilitation Services Office [phone] Weekend pager-[phone]    Potters Darice Norris 01/28/2023, 7:46 AM

## 2023-01-28 NOTE — Progress Notes (Signed)
 [facility] ADULT ICU REPLACEMENT PROTOCOL   The patient does apply for the [facility] Adult ICU Electrolyte Replacment Protocol based on the criteria listed below:   1.Exclusion criteria: TCTS, ECMO, Dialysis, and Myasthenia Gravis patients 2. Is GFR >/= 30 ml/min? Yes.    Patient's GFR today is >60 3. Is SCr </= 2? Yes.   Patient's SCr is 0.66 mg/dL 4. Did SCr increase >/= 0.5 in 24 hours? No. 5.Pt's weight >40kg  Yes.   6. Abnormal electrolyte(s): k 2.6  7. Electrolytes replaced per protocol 8.  Call MD STAT for K+ </= 2.5, Phos </= 1, or Mag </= 1 Physician:  Haze SHIN, Jara Feider A 01/28/2023 6:21 AM

## 2023-01-28 NOTE — Progress Notes (Signed)
 NAME:  Sabrina Roberson, MRN:  [id number], DOB:  December 21, 1970, LOS: 9 ADMISSION DATE:  01/19/2023, CONSULTATION DATE: 01/19/2023 REFERRING MD: Pamella Ozell LABOR, DO, CHIEF COMPLAINT: AMS and hypoxia today  History of Present Illness:  A 52 y.o. female patient with mild asthma on Albuterol  PRN, DM-2, HTN, dyslipidemia, hypothyroidism, GERD, and tobacco smoking (1 ppd for 30 yrs) who presented to ED with AMS, hypoxia 48% on RA, SOB, DOE, for dry cough for the last 2 days. Today he noted her to be confused and very somnolent while sitting on the couch with labored breathing. Regarding sick contacts, her husband notes the patient was watching a grandchild with recent viral URI symptoms earlier this week. No F/c/r, N/V/D, abd pain, rash, CP, illicit drug use, or alcohol intake.    Pertinent  Medical History  Mild asthma on Albuterol  PRN, DM-2, HTN, dyslipidemia, hypothyroidism, GERD  Significant Hospital Events: Including procedures, antibiotic start and stop dates in addition to other pertinent events   12/22 Admit with ARDS secondary to Influenza A PNA 12/23 Worsening ARDS, septic shock  12/24 Fio2 and PEEP requirements decreasing along with pressors requirements, flagyl  dc'd  12/25 MRSA in tracheal aspirate, on low dose pressors, azithromycin  dc'd  12/26 Extubated 12/28 Re-intubated for respiratory distress/anxiety  Interim History / Subjective:   Tolerating diuresis Remains agitated during the night  Objective   Blood pressure (!) 115/49, pulse 85, temperature 98.9 F (37.2 C), temperature source Axillary, resp. rate (!) 25, height 5' 4 (1.626 m), weight 69.9 kg, SpO2 98%.    Vent Mode: CPAP;PSV FiO2 (%):  [40 %] 40 % Set Rate:  [20 bmp] 20 bmp Vt Set:  [430 mL] 430 mL PEEP:  [5 cmH20] 5 cmH20 Pressure Support:  [10 cmH20] 10 cmH20 Plateau Pressure:  [17 cmH20-21 cmH20] 21 cmH20   Intake/Output Summary (Last 24 hours) at 01/28/2023 0859 Last data filed at 01/28/2023 0815 Gross per  24 hour  Intake 2756.91 ml  Output 5330 ml  Net -2573.09 ml   Filed Weights   01/25/23 0500 01/26/23 0414 01/27/23 0500  Weight: 66.5 kg 67.9 kg 69.9 kg   Physical Exam: General: Chronically ill-appearing, no acute distress HENT: [place], AT, ETT in place Eyes: EOMI, no scleral icterus Respiratory: Diminished but clear to auscultation bilaterally.  No crackles, wheezing or rales Cardiovascular: RRR, -M/R/G, no JVD GI: BS+, soft, nontender Extremities: Nonpitting edema,-tenderness Neuro: Awake and follows commands, CNII-XII grossly intact Psych: Anxious mood GU: Foley in place  K 2.6 WBC 21 increased  Resolved Hospital Problem list   Leukopenia resolved Assessment & Plan:   Severe ARDS  due to viral PNA (influenza A) and MRSA pneumonia Mild Asthma Hx  Increased leukocytosis, worsening secretions and increased infiltrates on CXR. Possible pulmonary edema P:  Extubated 12/26 and reintubated 12/28 Full vent support LTVV, 4-8cc/kg IBW with goal Pplat<30 and DP<15 Completed Tamiflu . End date 12/27 Escalate to Zyvox  12/29 F/u repeat trach cx - continues to show moderate S. Aureus Diurese Daily WUA/SBT. No plans for extubation due to tenuous respiratory status VAP PAD protocol Added Klonopin  12/28. Increased to 2 mg TID Added oxycodone  12/29. Increased to q4h dosing CXR tomorrow  Septic shock 2/2 Staph pneumonia superimposed on influenza Sedation related hypotension Possible colitis seen on CT abdominal scan-low suspicion  Echo EF 50-55%, RV and LF low normal function, no regional wall abnormalities  Intermittently on and off levophed . Currently off Repeat trach cx 12/29 - continues to show moderate S. Aureus P: Wean levophed   for MAP >65 S/p Vanc x 7 days Linezolid  12/29 F/u culture sensitivities Stress dose steroids daily  Thrombocytopenia in setting of Septic shock - resolved No signs of bleeding P: Continue to trend CBC daily, monitor for signs of  bleeding Transfuse if hgb < 7   Fluid Electrolyte imbalance  Hypokalemia Hypophosphatemia  Hypocalcemic  P:  Replete K Trend BMET this PM and daily  DM-2 CBGs stable  P: Continue CBG q 4 Continue SSI On TF  HTN P: Continue to hold antihypertensives  Dyslipidemia P: Continue Statin per tube   Hypothyroidism P:  Levothyroxine  per tube  GERD P:  Continue PPI daily   Tobacco smoking (30 PY)  P:  Counseled on smoking cessation Start nicotine  patch  Anxiety/Depression P: Restart wellbutrin  when able On valium  5 mg TID PRN at home Started klonopin  12/28  Latex Allergy  hx Latex foley removed Perineal swelling improving  P: Continue to use non-latex catheters and products     Best Practice (right click and Reselect all SmartList Selections daily)   Diet/type: TF DVT prophylaxis  heparin  subcutaneous Pressure ulcer(s): N/A GI prophylaxis: PPI On at home Lines: CVC 12/23  Foley:  Yes, and it is still needed Code Status:  full code Last date of multidisciplinary goals of care discussion updated husband at beside 12/30  12/28 Discussed risk of reintubation in the future and potential need for tracheostomy if anxiety remains an issue. Plan to continue current care for now and optimize sedating/anxiolytics and potential extubation this upcoming week   Critical care time: 45 min    The patient is critically ill with multiple organ systems failure and requires high complexity decision making for assessment and support, frequent evaluation and titration of therapies, application of advanced monitoring technologies and extensive interpretation of multiple databases.  Independent Critical Care Time: 45 Minutes.   Slater Staff, M.D. [facility] Pulmonary/Critical Care Medicine 01/28/2023 8:59 AM   Please see Amion for pager number to reach on-call Pulmonary and Critical Care Team.

## 2023-01-28 NOTE — Progress Notes (Signed)
 Nutrition Follow-up  DOCUMENTATION CODES:   Not applicable  INTERVENTION:  - Patient experiencing reflux with tube feeds. Plan to restart Vital 1.5 back at 46mL/hr as tolerated.   - Once tolerating and able to advance tube feeds, recommend: Vital 1.5 at 50 ml/h (1200 ml per day) *Consider advancing by 10mL Q12H Prosource TF20 60 ml daily Provides 1880 kcal, 101 gm protein, 917 ml free water daily   - Monitor magnesium , potassium, and phosphorus BID for at least 3 days, MD to replete as needed, as pt is at risk for refeeding syndrome.   - FWF per CCM/MD.   NUTRITION DIAGNOSIS:   Inadequate oral intake related to inability to eat as evidenced by NPO status. *ongoing  GOAL:   Patient will meet greater than or equal to 90% of their needs *progressing, on TF  MONITOR:   Vent status, Labs, Weight trends, TF tolerance  REASON FOR ASSESSMENT:   Consult Enteral/tube feeding initiation and management  ASSESSMENT:   52 y.o. female patient with mild asthma on Albuterol  PRN, DM-2, HTN, dyslipidemia, hypothyroidism, GERD, and tobacco smoking (1 ppd for 30 yrs) who presented to ED with AMS, hypoxia 48% on RA, SOB, DOE, for dry cough for the last 2 days. Today he noted her to be confused and very somnolent while sitting on the couch with labored breathing.  12/22 Admit, Intubated 12/23: TF initiated 12/26: Extubated, OGT removed 12/27: SLP eval -> NPO; later developed worsening respiratory distress and re-intubated 12/28: TF started at 68mL/hr 12/30: TF stopped @ 8:30am d/t reflux, restarted @ 4:30pm 12/31: TF stopped in AM for ongoing reflux  Patient is currently intubated on ventilator support MV: 10.3 L/min Temp (24hrs), Avg:98.6 F (37 C), Min:97.2 F (36.2 C), Max:99.7 F (37.6 C)  Husband at bedside at time of visit. Reports patient's previous UBW was 170# but that patient had wanted to lose weight so was intentionally losing weight to ~140#. Only weight history  within the past year starts In August when patient was weighed at 151#. Weight without significant changes since then.   He reports she would typically eat 2 meals a day at home but notes she wasn't eating well leading up to this admission.   Patient remains intubated at this time. TF was started on 12/28 but stopped 12/30. RN reports TF was stopped due to reflux, which was so bad it was coming back out of patient's OGT. TF restarted yesterday evening but had to be stopped again this AM d/t ongoing reflux. MD adding protonix  and reglan  and RN to try and restart back at 4mL/hr later today.    Admit weight: 139# Current weight: 154# I&O's: +3.7L   Medications reviewed and include: Colace, Miralax  Fentanyl  Levophed  @ 7mcg/min Propofol  @ 4.15/hr (provides 109 kcals over 24 hours)   Labs reviewed:  Na 130 K+ 2.6 HA1C 6.9 Blood Glucose 107-140 x24 hours   NUTRITION - FOCUSED PHYSICAL EXAM:  Flowsheet Row Most Recent Value  Orbital Region No depletion  Upper Arm Region No depletion  Thoracic and Lumbar Region No depletion  Buccal Region Unable to assess  Temple Region Mild depletion  Clavicle Bone Region No depletion  Clavicle and Acromion Bone Region No depletion  Scapular Bone Region Unable to assess  Dorsal Hand No depletion  Patellar Region No depletion  Anterior Thigh Region No depletion  Posterior Calf Region No depletion  Edema (RD Assessment) None  Hair Reviewed  Eyes Reviewed  Mouth Unable to assess  Skin Reviewed  Nails  Reviewed       Diet Order:   Diet Order             Diet NPO time specified  Diet effective now                   EDUCATION NEEDS:  Not appropriate for education at this time  Skin:  Skin Assessment: Reviewed RN Assessment  Last BM:  12/31 - type 7 - rectal tube  Height:  Ht Readings from Last 1 Encounters:  01/24/23 5' 4 (1.626 m)   Weight:  Wt Readings from Last 1 Encounters:  01/27/23 69.9 kg    BMI:  Body mass  index is 26.45 kg/m.  Estimated Nutritional Needs:  Kcal:  1700-1900 kcals Protein:  80-100 grams Fluid:  >/= 1.7L    Trude Ned RD, LDN Contact via Secure Chat.

## 2023-01-29 ENCOUNTER — Inpatient Hospital Stay (HOSPITAL_COMMUNITY): Payer: Medicare Other

## 2023-01-29 DIAGNOSIS — A419 Sepsis, unspecified organism: Secondary | ICD-10-CM | POA: Diagnosis not present

## 2023-01-29 DIAGNOSIS — R6521 Severe sepsis with septic shock: Secondary | ICD-10-CM | POA: Diagnosis not present

## 2023-01-29 LAB — CBC
HCT: 26.7 % — ABNORMAL LOW (ref 36.0–46.0)
Hemoglobin: 8.4 g/dL — ABNORMAL LOW (ref 12.0–15.0)
MCH: 28.9 pg (ref 26.0–34.0)
MCHC: 31.5 g/dL (ref 30.0–36.0)
MCV: 91.8 fL (ref 80.0–100.0)
Platelets: 215 10*3/uL (ref 150–400)
RBC: 2.91 MIL/uL — ABNORMAL LOW (ref 3.87–5.11)
RDW: 13.6 % (ref 11.5–15.5)
WBC: 15.8 10*3/uL — ABNORMAL HIGH (ref 4.0–10.5)
nRBC: 0 % (ref 0.0–0.2)

## 2023-01-29 LAB — BASIC METABOLIC PANEL
Anion gap: 8 (ref 5–15)
Anion gap: 8 (ref 5–15)
BUN: 17 mg/dL (ref 6–20)
BUN: 13 mg/dL (ref 6–20)
CO2: 35 mmol/L — ABNORMAL HIGH (ref 22–32)
CO2: 35 mmol/L — ABNORMAL HIGH (ref 22–32)
Calcium: 7.6 mg/dL — ABNORMAL LOW (ref 8.9–10.3)
Calcium: 7.6 mg/dL — ABNORMAL LOW (ref 8.9–10.3)
Chloride: 93 mmol/L — ABNORMAL LOW (ref 98–111)
Chloride: 91 mmol/L — ABNORMAL LOW (ref 98–111)
Creatinine, Ser: 0.68 mg/dL (ref 0.44–1.00)
Creatinine, Ser: 0.64 mg/dL (ref 0.44–1.00)
GFR, Estimated: >60 mL/min (ref >60)
GFR, Estimated: >60 mL/min (ref >60)
Glucose, Bld: 95 mg/dL (ref 70–99)
Glucose, Bld: 95 mg/dL (ref 70–99)
Potassium: 2.9 mmol/L — ABNORMAL LOW (ref 3.5–5.1)
Potassium: 3.3 mmol/L — ABNORMAL LOW (ref 3.5–5.1)
Sodium: 136 mmol/L (ref 135–145)
Sodium: 134 mmol/L — ABNORMAL LOW (ref 135–145)

## 2023-01-29 LAB — MAGNESIUM: Magnesium: 2.1 mg/dL (ref 1.7–2.4)

## 2023-01-29 LAB — GLUCOSE, CAPILLARY
Glucose-Capillary: 111 mg/dL — ABNORMAL HIGH (ref 70–99)
Glucose-Capillary: 115 mg/dL — ABNORMAL HIGH (ref 70–99)
Glucose-Capillary: 125 mg/dL — ABNORMAL HIGH (ref 70–99)
Glucose-Capillary: 284 mg/dL — ABNORMAL HIGH (ref 70–99)
Glucose-Capillary: 82 mg/dL (ref 70–99)
Glucose-Capillary: 87 mg/dL (ref 70–99)
Glucose-Capillary: 91 mg/dL (ref 70–99)

## 2023-01-29 MED ORDER — FUROSEMIDE 10 MG/ML IJ SOLN
40.0000 mg | Freq: Once | INTRAMUSCULAR | Status: AC
  Administered 2023-01-29: 40 mg via INTRAVENOUS
  Filled 2023-01-29: qty 4

## 2023-01-29 MED ORDER — POTASSIUM CHLORIDE 10 MEQ/50ML IV SOLN
10.0000 meq | INTRAVENOUS | Status: AC
  Administered 2023-01-29 (×4): 10 meq via INTRAVENOUS
  Filled 2023-01-29 (×4): qty 50

## 2023-01-29 MED ORDER — POTASSIUM CHLORIDE 20 MEQ PO PACK
20.0000 meq | PACK | ORAL | Status: AC
  Administered 2023-01-29 (×2): 20 meq
  Filled 2023-01-29 (×2): qty 1

## 2023-01-29 MED ORDER — POTASSIUM CHLORIDE 20 MEQ PO PACK
40.0000 meq | PACK | Freq: Once | ORAL | Status: AC
  Administered 2023-01-29: 40 meq
  Filled 2023-01-29: qty 2

## 2023-01-29 MED ORDER — POTASSIUM CHLORIDE CRYS ER 20 MEQ PO TBCR
20.0000 meq | EXTENDED_RELEASE_TABLET | ORAL | Status: DC
Start: 2023-01-29 — End: 2023-01-29

## 2023-01-29 NOTE — Procedures (Signed)
 Extubation Procedure Note  Patient Details:   Name: Sabrina Roberson DOB: [birth year] MRN: [id number]   Airway Documentation:    Vent end date: 01/29/23 Vent end time: 1030   Evaluation  O2 sats: transiently fell during during procedure Complications: No apparent complications Patient did tolerate procedure well. Bilateral Breath Sounds: Clear, Diminished   Yes  Smitty Anna Nannette 01/29/2023, 10:35 AM  Positive cuff leak pre extubation. Placed on 5 LPM nasal cannula post extubation- increased to 10 LPM Salter for Sp02 goal of 88-92% (Currently 93%)- RN aware.

## 2023-01-29 NOTE — Plan of Care (Signed)
  Problem: Coping: Goal: Level of anxiety will decrease Outcome: Progressing   Problem: Elimination: Goal: Will not experience complications related to bowel motility Outcome: Progressing Goal: Will not experience complications related to urinary retention Outcome: Progressing   Problem: Pain Management: Goal: General experience of comfort will improve Outcome: Progressing

## 2023-01-29 NOTE — Progress Notes (Signed)
 NAME:  Sabrina Roberson, MRN:  [id number], DOB:  06-26-1970, LOS: 10 ADMISSION DATE:  01/19/2023, CONSULTATION DATE: 01/19/2023 REFERRING MD: Pamella Ozell LABOR, DO, CHIEF COMPLAINT: AMS and hypoxia today  History of Present Illness:  A 53 y.o. female patient with mild asthma on Albuterol  PRN, DM-2, HTN, dyslipidemia, hypothyroidism, GERD, and tobacco smoking (1 ppd for 30 yrs) who presented to ED with AMS, hypoxia 48% on RA, SOB, DOE, for dry cough for the last 2 days. Today he noted her to be confused and very somnolent while sitting on the couch with labored breathing. Regarding sick contacts, her husband notes the patient was watching a grandchild with recent viral URI symptoms earlier this week. No F/c/r, N/V/D, abd pain, rash, CP, illicit drug use, or alcohol intake.    Pertinent  Medical History  Mild asthma on Albuterol  PRN, DM-2, HTN, dyslipidemia, hypothyroidism, GERD  Significant Hospital Events: Including procedures, antibiotic start and stop dates in addition to other pertinent events   12/22 Admit with ARDS secondary to Influenza A PNA 12/23 Worsening ARDS, septic shock  12/24 Fio2 and PEEP requirements decreasing along with pressors requirements, flagyl  dc'd  12/25 MRSA in tracheal aspirate, on low dose pressors, azithromycin  dc'd  12/26 Extubated 12/28 Re-intubated for respiratory distress/anxiety 12/29-12/31 Diuresing  Interim History / Subjective:  Good UOP with diureses Tolerating PS yesterday  Objective   Blood pressure (!) 102/41, pulse 65, temperature 98.3 F (36.8 C), temperature source Axillary, resp. rate 20, height 5' 4 (1.626 m), weight 64.1 kg, SpO2 92%.    Vent Mode: PRVC FiO2 (%):  [40 %] 40 % Set Rate:  [20 bmp] 20 bmp Vt Set:  [430 mL] 430 mL PEEP:  [5 cmH20] 5 cmH20 Pressure Support:  [10 cmH20] 10 cmH20 Plateau Pressure:  [14 cmH20-21 cmH20] 21 cmH20   Intake/Output Summary (Last 24 hours) at 01/29/2023 0809 Last data filed at 01/29/2023 9379 Gross  per 24 hour  Intake 2062.97 ml  Output 4650 ml  Net -2587.03 ml   Filed Weights   01/26/23 0414 01/27/23 0500 01/29/23 0432  Weight: 67.9 kg 69.9 kg 64.1 kg   Physical Exam: General: Chronically ill-appearing, anxious but no acute distress HENT: [place], AT, ETT in place Eyes: EOMI, no scleral icterus Respiratory: Diminished but clear to auscultation bilaterally.  No crackles, wheezing or rales Cardiovascular: RRR, -M/R/G, no JVD GI: BS+, soft, nontender Extremities:-Edema,-tenderness Neuro: Awake, following commands, CNII-XII grossly intact GU: Foley in place  K 2.9 WBC 15.8, improving  Resolved Hospital Problem list   Leukopenia resolved Assessment & Plan:   Severe ARDS  due to viral PNA (influenza A) and MRSA pneumonia Mild Asthma Hx  Increased leukocytosis, worsening secretions and increased infiltrates on CXR. Possible pulmonary edema Completed Tamiflu . End date 12/27 P:  Extubated 12/26 and reintubated 12/28 Full vent support LTVV, 4-8cc/kg IBW with goal Pplat<30 and DP<15 Escalated to Zyvox  12/29. Cultures sensitive to  Daily SBT/WUA. Plan for extubation if passes. Discussed with husband at bedside regarding risk of reintubation and if she did need this, then would recommend tracheostomy in the future  Diurese VAP PAD protocol Added Klonopin  12/28. Increased to 2 mg TID Added oxycodone  12/29. Increased to q4h dosing NGT placed to continue enteral meds  Septic shock 2/2 Staph pneumonia superimposed on influenza Sedation related hypotension - remains on low dose levo due to sedation Possible colitis seen on CT abdominal scan-low suspicion  Echo EF 50-55%, RV and LF low normal function, no regional wall abnormalities  Intermittently on and off levophed . Currently off Repeat trach cx 12/29 - Moderate S. Aureus P: Wean levophed  for MAP >65 S/p Vanc x 7 days Linezolid  12/29 DC stress dose steroids  Thrombocytopenia in setting of Septic shock - resolved No signs of  bleeding P: Continue to trend CBC daily, monitor for signs of bleeding Transfuse if hgb < 7   Fluid Electrolyte imbalance  Hypokalemia Hypophosphatemia  Hypocalcemic  P:  Replete K Trend BMET this PM and daily  DM-2 CBGs stable  P: Continue CBG q 4 Continue SSI On TF  HTN P: Continue to hold antihypertensives  Dyslipidemia P: Continue Statin per tube   Hypothyroidism P:  Levothyroxine  per tube  GERD P:  Continue PPI daily   Tobacco smoking (30 PY)  P:  Counseled on smoking cessation Start nicotine  patch  Anxiety/Depression P: Restart wellbutrin  when able On valium  5 mg TID PRN at home Started klonopin  12/28  Latex Allergy  hx Latex foley removed Perineal swelling improving  P: Continue to use non-latex catheters and products     Best Practice (right click and Reselect all SmartList Selections daily)   Diet/type: Hold TF for potential extubation DVT prophylaxis  heparin  subcutaneous Pressure ulcer(s): N/A GI prophylaxis: PPI On at home Lines: CVC 12/23  Foley:  Yes, and it is still needed Code Status:  full code Last date of multidisciplinary goals of care discussion updated husband at beside 1/1  12/28 Discussed risk of reintubation in the future and potential need for tracheostomy if anxiety remains an issue. Plan to continue current care for now and optimize sedating/anxiolytics and potential extubation this upcoming week   Critical care time: 50 min    The patient is critically ill with multiple organ systems failure and requires high complexity decision making for assessment and support, frequent evaluation and titration of therapies, application of advanced monitoring technologies and extensive interpretation of multiple databases.  Independent Critical Care Time: 50 Minutes.   Slater Staff, M.D. [facility] Pulmonary/Critical Care Medicine 01/29/2023 8:09 AM   Please see Amion for pager number to reach on-call Pulmonary and Critical Care  Team.

## 2023-01-29 NOTE — Progress Notes (Signed)
 Patient stable and comfortable on 8Lpm Salter HFNC.  BiPAP not indicated at this time.

## 2023-01-29 NOTE — Progress Notes (Signed)
 [facility] ADULT ICU REPLACEMENT PROTOCOL   The patient does apply for the [facility] Adult ICU Electrolyte Replacment Protocol based on the criteria listed below:   1.Exclusion criteria: TCTS, ECMO, Dialysis, and Myasthenia Gravis patients 2. Is GFR >/= 30 ml/min? Yes.    Patient's GFR today is >60 3. Is SCr </= 2? Yes.   Patient's SCr is 0.68 mg/dL 4. Did SCr increase >/= 0.5 in 24 hours? No. 5.Pt's weight >40kg  Yes.   6. Abnormal electrolyte(s): 0.68  7. Electrolytes replaced per protocol 8.  Call MD STAT for K+ </= 2.5, Phos </= 1, or Mag </= 1 Physician:  Dr. Haze Harari, Reena Caldron 01/29/2023 6:04 AM

## 2023-01-30 DIAGNOSIS — R6521 Severe sepsis with septic shock: Secondary | ICD-10-CM | POA: Diagnosis not present

## 2023-01-30 DIAGNOSIS — A419 Sepsis, unspecified organism: Secondary | ICD-10-CM | POA: Diagnosis not present

## 2023-01-30 LAB — BASIC METABOLIC PANEL
Anion gap: 7 (ref 5–15)
BUN: 11 mg/dL (ref 6–20)
CO2: 31 mmol/L (ref 22–32)
Calcium: 7 mg/dL — ABNORMAL LOW (ref 8.9–10.3)
Chloride: 93 mmol/L — ABNORMAL LOW (ref 98–111)
Creatinine, Ser: 0.66 mg/dL (ref 0.44–1.00)
GFR, Estimated: >60 mL/min (ref >60)
Glucose, Bld: 111 mg/dL — ABNORMAL HIGH (ref 70–99)
Potassium: 4.1 mmol/L (ref 3.5–5.1)
Sodium: 131 mmol/L — ABNORMAL LOW (ref 135–145)

## 2023-01-30 LAB — GLUCOSE, CAPILLARY
Glucose-Capillary: 119 mg/dL — ABNORMAL HIGH (ref 70–99)
Glucose-Capillary: 130 mg/dL — ABNORMAL HIGH (ref 70–99)
Glucose-Capillary: 115 mg/dL — ABNORMAL HIGH (ref 70–99)
Glucose-Capillary: 122 mg/dL — ABNORMAL HIGH (ref 70–99)
Glucose-Capillary: 112 mg/dL — ABNORMAL HIGH (ref 70–99)

## 2023-01-30 LAB — CBC
HCT: 29.3 % — ABNORMAL LOW (ref 36.0–46.0)
Hemoglobin: 9.2 g/dL — ABNORMAL LOW (ref 12.0–15.0)
MCH: 28.8 pg (ref 26.0–34.0)
MCHC: 31.4 g/dL (ref 30.0–36.0)
MCV: 91.8 fL (ref 80.0–100.0)
Platelets: 237 10*3/uL (ref 150–400)
RBC: 3.19 MIL/uL — ABNORMAL LOW (ref 3.87–5.11)
RDW: 13.4 % (ref 11.5–15.5)
WBC: 16.5 10*3/uL — ABNORMAL HIGH (ref 4.0–10.5)
nRBC: 0 % (ref 0.0–0.2)

## 2023-01-30 LAB — MAGNESIUM: Magnesium: 1.8 mg/dL (ref 1.7–2.4)

## 2023-01-30 MED ORDER — ONDANSETRON HCL 4 MG/2ML IJ SOLN
4.0000 mg | Freq: Four times a day (QID) | INTRAMUSCULAR | Status: DC | PRN
  Administered 2023-01-30 – 2023-02-01 (×2): 4 mg via INTRAVENOUS
  Filled 2023-01-30 (×2): qty 2

## 2023-01-30 MED ORDER — MAGNESIUM SULFATE 2 GM/50ML IV SOLN
2.0000 g | Freq: Once | INTRAVENOUS | Status: AC
Start: 2023-01-30 — End: 2023-01-30
  Administered 2023-01-30: 2 g via INTRAVENOUS
  Filled 2023-01-30: qty 50

## 2023-01-30 NOTE — Evaluation (Signed)
 Physical Therapy Evaluation Patient Details Name: Sabrina Roberson MRN: [id number] DOB: 1970/07/14 Today's Date: 01/30/2023  History of Present Illness  A 53 y.o. female patient who presented to ED 01/19/23 with AMS, hypoxia,septic shock,  positive influenza A,required intubation x 2, extubated 01/29/23. EFY:tpuy mild asthma on Albuterol  PRN, DM-2, HTN, dyslipidemia, hypothyroidism, GERD, and tobacco smoking (1 ppd for 30 yrs)  Clinical Impression  Pt admitted with above diagnosis.  Pt currently with functional limitations due to the deficits listed below (see PT Problem List). Pt will benefit from acute skilled PT to increase their independence and safety with mobility to allow discharge.   The Patient is alert and ready to mobilize.  Patient is able to move to sitting up and  step to recliner with mod support.  Patient maintained on 4 LPM, BP-sup-102/45, after transfer 108/51, after sitting  upright x 5 -97/48. No reports of dizziness. HR remained in 70-80's, SPO2 >95%.  Patient independent and caregiver of 2 grandchildren  during day. Patient's spouse available  at DC.  Patient hopes to return home, recommend HHPT. DME TBD.         If plan is discharge home, recommend the following: A little help with walking and/or transfers;A little help with bathing/dressing/bathroom;Direct supervision/assist for medications management;Assistance with cooking/housework;Help with stairs or ramp for entrance;Assist for transportation   Can travel by private vehicle        Equipment Recommendations Rolling walker (2 wheels)  Recommendations for Other Services       Functional Status Assessment Patient has had a recent decline in their functional status and demonstrates the ability to make significant improvements in function in a reasonable and predictable amount of time.     Precautions / Restrictions Precautions Precautions: Fall Precaution Comments: hypotension, on pressor, fecal collection, NG  TF Restrictions Weight Bearing Restrictions Per Provider Order: No      Mobility  Bed Mobility Overal bed mobility: Needs Assistance Bed Mobility: Rolling, Sidelying to Sit Rolling: Contact guard assist Sidelying to sit: Min assist       General bed mobility comments: min assit to  raise trunk to fully upright    Transfers Overall transfer level: Needs assistance Equipment used: 1 person hand held assist Transfers: Sit to/from Stand, Bed to chair/wheelchair/BSC Sit to Stand: +2 safety/equipment, +2 physical assistance   Step pivot transfers: +2 physical assistance, +2 safety/equipment       General transfer comment: steady support to stand and step to recliner.    Ambulation/Gait               General Gait Details: TBA  Stairs            Wheelchair Mobility     Tilt Bed    Modified Rankin (Stroke Patients Only)       Balance Overall balance assessment: Needs assistance Sitting-balance support: Bilateral upper extremity supported, Feet supported Sitting balance-Leahy Scale: Fair     Standing balance support: Bilateral upper extremity supported, During functional activity Standing balance-Leahy Scale: Poor Standing balance comment: reliant on external support                             Pertinent Vitals/Pain Pain Assessment Pain Assessment: Faces Faces Pain Scale: Hurts a little bit Pain Location: throat Pain Descriptors / Indicators: Discomfort Pain Intervention(s): Monitored during session    Home Living Family/patient expects to be discharged to:: Private residence Living Arrangements: Spouse/significant other;Children;Other relatives Available Help  at Discharge: Family;Available 24 hours/day Type of Home: House Home Access: Stairs to enter       Home Layout: One level Home Equipment: None      Prior Function Prior Level of Function : Independent/Modified Independent             Mobility Comments: takes care  of 2 young  grandchildren ADLs Comments: Independent in ADLs     Extremity/Trunk Assessment   Upper Extremity Assessment Upper Extremity Assessment: Generalized weakness    Lower Extremity Assessment Lower Extremity Assessment: Generalized weakness    Cervical / Trunk Assessment Cervical / Trunk Assessment: Normal;Other exceptions Cervical / Trunk Exceptions: noted mild  head trmors  Communication   Communication Communication: No apparent difficulties  Cognition Arousal: Alert Behavior During Therapy: WFL for tasks assessed/performed Overall Cognitive Status: Impaired/Different from baseline Area of Impairment: Orientation                 Orientation Level: Time             General Comments: O to [facility], Month/yr. folloes directions well        General Comments      Exercises     Assessment/Plan    PT Assessment Patient needs continued PT services  PT Problem List Decreased strength;Decreased balance;Decreased cognition;Decreased knowledge of precautions;Decreased range of motion;Decreased activity tolerance;Cardiopulmonary status limiting activity;Decreased safety awareness       PT Treatment Interventions DME instruction;Therapeutic activities;Cognitive remediation;Gait training;Therapeutic exercise;Patient/family education;Functional mobility training    PT Goals (Current goals can be found in the Care Plan section)  Acute Rehab PT Goals Patient Stated Goal: home, see grandchildren PT Goal Formulation: With patient/family Time For Goal Achievement: 02/13/23 Potential to Achieve Goals: Good    Frequency Min 1X/week     Co-evaluation               AM-PAC PT 6 Clicks Mobility  Outcome Measure Help needed turning from your back to your side while in a flat bed without using bedrails?: A Little Help needed moving from lying on your back to sitting on the side of a flat bed without using bedrails?: A Lot Help needed moving to and from a bed  to a chair (including a wheelchair)?: A Lot Help needed standing up from a chair using your arms (e.g., wheelchair or bedside chair)?: A Lot Help needed to walk in hospital room?: Total Help needed climbing 3-5 steps with a railing? : Total 6 Click Score: 11    End of Session Equipment Utilized During Treatment: Oxygen Activity Tolerance: Patient tolerated treatment well Patient left: in chair;with call bell/phone within reach;with chair alarm set Nurse Communication: Mobility status PT Visit Diagnosis: Unsteadiness on feet (R26.81);Difficulty in walking, not elsewhere classified (R26.2)    Time: 9151-9072 PT Time Calculation (min) (ACUTE ONLY): 39 min   Charges:   PT Evaluation $PT Eval Low Complexity: 1 Low PT Treatments $Therapeutic Activity: 23-37 mins PT General Charges $$ ACUTE PT VISIT: 1 Visit         [name] PT Acute Rehabilitation Services Office [phone] Weekend pager-[phone]   Potters Darice Norris 01/30/2023, 9:43 AM

## 2023-01-30 NOTE — Plan of Care (Signed)

## 2023-01-30 NOTE — Progress Notes (Signed)
 NAME:  Sabrina Roberson, MRN:  [id number], DOB:  [birth year], LOS: 11 ADMISSION DATE:  01/19/2023, CONSULTATION DATE: 01/19/2023 REFERRING MD: Pamella Ozell LABOR, DO, CHIEF COMPLAINT: AMS and hypoxia today  History of Present Illness:  A 53 y.o. female patient with mild asthma on Albuterol  PRN, DM-2, HTN, dyslipidemia, hypothyroidism, GERD, and tobacco smoking (1 ppd for 30 yrs) who presented to ED with AMS, hypoxia 48% on RA, SOB, DOE, for dry cough for the last 2 days. Today he noted her to be confused and very somnolent while sitting on the couch with labored breathing. Regarding sick contacts, her husband notes the patient was watching a grandchild with recent viral URI symptoms earlier this week. No F/c/r, N/V/D, abd pain, rash, CP, illicit drug use, or alcohol intake.    Pertinent  Medical History  Mild asthma on Albuterol  PRN, DM-2, HTN, dyslipidemia, hypothyroidism, GERD  Significant Hospital Events: Including procedures, antibiotic start and stop dates in addition to other pertinent events   12/22 Admit with ARDS secondary to Influenza A PNA 12/23 Worsening ARDS, septic shock  12/24 Fio2 and PEEP requirements decreasing along with pressors requirements, flagyl  dc'd  12/25 MRSA in tracheal aspirate, on low dose pressors, azithromycin  dc'd  12/26 Extubated 12/28 Re-intubated for respiratory distress/anxiety 12/29-12/31 Diuresing 01/29/2023 extubated   Interim History / Subjective:  Awake alert interactive Denies any pain or discomfort She does have a cough, clearing secretions easily  Objective   Blood pressure (!) 103/55, pulse 66, temperature 98.8 F (37.1 C), temperature source Oral, resp. rate 17, height 5' 4 (1.626 m), weight 60.8 kg, SpO2 96%.        Intake/Output Summary (Last 24 hours) at 01/30/2023 9060 Last data filed at 01/30/2023 0600 Gross per 24 hour  Intake 1725.53 ml  Output 5225 ml  Net -3499.47 ml   Filed Weights   01/27/23 0500 01/29/23 0432 01/30/23 0500   Weight: 69.9 kg 64.1 kg 60.8 kg   Physical Exam: General: Chronically ill-appearing, not in acute distress, on nasal cannula HENT: Moist oral mucosa Eyes: No icterus Respiratory: Diminished air entry some rhonchi Cardiovascular: S1-S2 appreciated GI: Bowel sounds appreciated Extremities:-No edema, no clubbing Neuro: Awake, following commands, CNII-XII grossly intact GU: Foley in place  I reviewed nursing notes, Consultant notes, hospitalist notes, last 24 h vitals and pain scores, last 48 h intake and output, last 24 h labs and trends, and last 24 h imaging results.  Resolved Hospital Problem list   Leukopenia resolved Assessment & Plan:   Severe ARDS due to viral pneumonia, influenza A, MRSA pneumonia History of asthma -Did complete Tamiflu  12/27 -Leukocytosis -On Zyvox   Acute hypoxemic respiratory failure -Extubated 01/29/2023 -Adequately maintained on supplemental oxygen at present  Anxiety -Continue Klonopin , continue oxycodone   Thrombocytopenia in the setting of septic shock -Continue to monitor  Type 2 diabetes -Continue SSI  Hypertension Antihypertensives on hold  GERD  Active smoker -Plans to quit smoking  Latex Allergy  hx Latex foley removed Perineal swelling improving  P: Continue to use non-latex catheters and products     Best Practice (right click and Reselect all SmartList Selections daily)   Diet/type: Swallow evaluation and diet as tolerated DVT prophylaxis  heparin  subcutaneous Pressure ulcer(s): N/A GI prophylaxis: Continue PPI Lines: CVC 12/23  Foley:  Yes, and it is still needed Code Status:  full code Last date of multidisciplinary goals of care discussion -updated spouse at bedside  12/28 Discussed risk of reintubation in the future and potential need for tracheostomy if  anxiety remains an issue. Plan to continue current care for now and optimize sedating/anxiolytics and potential extubation this upcoming week  Discussed with  spouse at bedside 01/30/2023  The patient is critically ill with multiple organ systems failure and requires high complexity decision making for assessment and support, frequent evaluation and titration of therapies, application of advanced monitoring technologies and extensive interpretation of multiple databases. Critical Care Time devoted to patient care services described in this note independent of APP/resident time (if applicable)  is 33 minutes.   Jennet Epley MD [facility] Personal pager: See Amion If unanswered, please page CCM On-call: [phone]

## 2023-01-30 NOTE — Progress Notes (Signed)
 [facility] ADULT ICU REPLACEMENT PROTOCOL   The patient does apply for the [facility] Adult ICU Electrolyte Replacment Protocol based on the criteria listed below:   1.Exclusion criteria: TCTS, ECMO, Dialysis, and Myasthenia Gravis patients 2. Is GFR >/= 30 ml/min? Yes.    Patient's GFR today is >60 3. Is SCr </= 2? Yes.   Patient's SCr is 0.66 mg/dL 4. Did SCr increase >/= 0.5 in 24 hours? No. 5.Pt's weight >40kg  Yes.   6. Abnormal electrolyte(s): Mg = 1.8  7. Electrolytes replaced per protocol 8.  Call MD STAT for K+ </= 2.5, Phos </= 1, or Mag </= 1 Physician:  Haze, eMD  Rosina LOISE Hamilton 01/30/2023 5:39 AM

## 2023-01-30 NOTE — Evaluation (Signed)
 Clinical/Bedside Swallow Evaluation Patient Details  Name: Sabrina Roberson MRN: [id number] Date of Birth: 02-08-1970  Today's Date: 01/30/2023 Time: SLP Start Time (ACUTE ONLY): 1640 SLP Stop Time (ACUTE ONLY): 1705 SLP Time Calculation (min) (ACUTE ONLY): 25 min  Past Medical History:  Past Medical History:  Diagnosis Date   Allergy     Anxiety    Arthritis    Asthma    Bruises easily    Chronic chest wall pain    Chronic pain    Cough    Depression    Diabetes mellitus without complication (HCC)    Dyspnea    chronic   Encounter for therapeutic drug monitoring 02/11/2011   Fibromyalgia    Functional movement disorder    GERD (gastroesophageal reflux disease)    Headache    Hearing loss    High cholesterol    History of hiatal hernia    Hypertension    Hypothyroidism    Kidney disease    stage 3   Leg swelling    both   Nausea 02/11/2011   Nausea and vomiting 04/01/2013   Pain management    PONV (postoperative nausea and vomiting)    SMAS (superior mesenteric artery syndrome) (HCC)    Thyroid  disease    TIA (transient ischemic attack)    Tic disorder    TMJ (dislocation of temporomandibular joint)    Transient cerebral ischemia    Type 2 diabetes mellitus with complications (HCC)    Past Surgical History:  Past Surgical History:  Procedure Laterality Date   ABDOMINAL HYSTERECTOMY     AORTA - SUPERIOR MESENTERIC AND AORTA - RENAL ARTERY BYPASS GRAFT     APPENDECTOMY     BREAST CYST EXCISION Right    BREAST EXCISIONAL BIOPSY     CHOLECYSTECTOMY     cyst removed     HERNIA REPAIR     OOPHORECTOMY     bilateral   TEE WITHOUT CARDIOVERSION N/A 06/18/2012   Procedure: TRANSESOPHAGEAL ECHOCARDIOGRAM (TEE);  Surgeon: Jerel Balding, MD;  Location: [facility] ENDOSCOPY;  Service: Cardiovascular;  Laterality: N/A;   HPI:  Patient is a 53 year old female admitted to [facility] from [facility] with cough x 2 days prior to admission and altered mental status.  Patient found  to have influenza A, shock, ARDS, with increased oxygen needs and was intubated 12 22-12 26.  After extubation patient desatted was placed on BiPAP and salter.  Past medical history positive for gerd, mild asthma, smoker, fibromyalgia, takes care of grandchild, adm from [facility] with cough x2 days prior to admission, found to have influenza A, shock, ARDS, requiring incr oxygen, intubated 12/22 to 12/26- desat then on Salter.  She required reintubation 12/28-01/29/2023.  CXR worsening, other past medical history includes h/o tremors, gait issues, pain, tremor, nausea,  confusion, visual changes, hearing loss, difficulty walking, numbness, weakness and possible Bell's palsy.  She had been worked up previously for potential multiple sclerosis s/p 2015 images, prior gastric emptying study - normal in the past.  CXR 01/30/2023 1. Stable support apparatus.  2. No change in bilateral interstitial and airspace opacities    Assessment / Plan / Recommendation  Clinical Impression  Limited assessment as pt only took few very small sips and tiny bites of food with this SLP. She verbalized that she was scared indicating she did not want to be reintubated.  When inquired if worried food or drink may come back up vs go down the wrong way on the way down -  she stated both.  She reports she coughed up some liquids earlier today that she had consumed - RN did not witness this occuring.  Pt has however had some productive cough today per RN.  Voice is mildly hoarse but strong and pt's volitional cough is strong.    Subtle reflexive cough noted throughout session, did not appear coorelated to minimal po intake pt consumed.  Adequate mastication and oral clearance and no indication of oropharyngeal retention with tiny bite of graham cracker and 2 tiny bites of applesauce.  Pt did verbalize It's sticky -re: graham cracker in her mouth.  She benefited from encouragement to consume intake with this SLP.    Anticipate pt will be  able to manage po diet - but did not conduct 3 ounce Yale swallow screen due to her reticence.  Recommend allowing her small sips of liquids tonight and SLP will follow up next morning for diagnostic-treatment.   She will benefit from 3 ounce Yale to rule out silent aspiration given requiring intubation x2 for 10 days total.    Hopefully pt will be more comfortable with po after initiating liquids slowly. All vitals were stable throughout session with no increased work of breathing and reciprocity of swallow/breathing was adequate.  Recommend medications be given via small bore tube for pt's comfort. SLP Visit Diagnosis: Dysphagia, unspecified (R13.10)    Aspiration Risk  Mild aspiration risk    Diet Recommendation Thin liquid;Other (Comment) (sips)    Liquid Administration via: Cup;Straw Medication Administration: Via alternative means Supervision: Patient able to self feed Compensations: Slow rate;Small sips/bites Postural Changes: Seated upright at 90 degrees;Remain upright for at least 30 minutes after po intake    Other  Recommendations Oral Care Recommendations: Oral care BID    Recommendations for follow up therapy are one component of a multi-disciplinary discharge planning process, led by the attending physician.  Recommendations may be updated based on patient status, additional functional criteria and insurance authorization.  Follow up Recommendations Follow physician's recommendations for discharge plan and follow up therapies      Assistance Recommended at Discharge  TBD  Functional Status Assessment Patient has had a recent decline in their functional status and demonstrates the ability to make significant improvements in function in a reasonable and predictable amount of time.  Frequency and Duration min 1 x/week  1 week       Prognosis Prognosis for improved oropharyngeal function: Good      Swallow Study   General Date of Onset: 01/30/23 HPI: Patient is a  53 year old female admitted to [facility] from [facility] with cough x 2 days prior to admission and altered mental status.  Patient found to have influenza A, shock, ARDS, with increased oxygen needs and was intubated 12 22-12 26.  After extubation patient desatted was placed on BiPAP and salter.  Past medical history positive for gerd, mild asthma, smoker, fibromyalgia, takes care of grandchild, adm from [facility] with cough x2 days prior to admission, found to have influenza A, shock, ARDS, requiring incr oxygen, intubated 12/22 to 12/26- desat then on Salter.  She required reintubation 12/28-01/29/2023.  CXR worsening, other past medical history includes h/o tremors, gait issues, pain, tremor, nausea,  confusion, visual changes, hearing loss, difficulty walking, numbness, weakness and possible Bell's palsy.  She had been worked up previously for potential multiple sclerosis s/p 2015 images, prior gastric emptying study - normal in the past.  CXR 01/30/2023 1. Stable support apparatus.  2. No change in bilateral interstitial  and airspace opacities Type of Study: Bedside Swallow Evaluation Previous Swallow Assessment: prior evaluation on 12/27- rec npo and small single ice chips- pt reintubated 12/28 Diet Prior to this Study: NPO Temperature Spikes Noted: No Respiratory Status: Nasal cannula History of Recent Intubation: Yes Total duration of intubation (days): 10 days (divided into 2 intubation episodes) Date extubated: 01/29/23 (12/22-12/26/2024 and 12/28-01/29/2023) Behavior/Cognition: Alert;Cooperative Oral Care Completed by SLP: Recent completion by staff Oral Cavity - Dentition: Adequate natural dentition;Other (Comment) (upper partial) Vision: Functional for self-feeding Self-Feeding Abilities: Able to feed self Patient Positioning: Upright in bed Baseline Vocal Quality: Other (comment) (mildly hoarse) Volitional Cough: Strong Volitional Swallow: Able to elicit    Oral/Motor/Sensory Function  Overall Oral Motor/Sensory Function: Within functional limits   Ice Chips Ice chips: Within functional limits Presentation: Spoon   Thin Liquid Thin Liquid: Within functional limits Presentation: Self Fed;Straw;Spoon    Nectar Thick Nectar Thick Liquid: Not tested   Honey Thick Honey Thick Liquid: Not tested   Puree Puree: Within functional limits Presentation: Spoon   Solid     Solid: Within functional limits Presentation: Self Fed      Nicolas Emmie Caldron 01/30/2023,5:27 PM  Madelin POUR, MS [facility] SLP Acute Rehab Services Office [phone]

## 2023-01-31 DIAGNOSIS — R6521 Severe sepsis with septic shock: Secondary | ICD-10-CM | POA: Diagnosis not present

## 2023-01-31 DIAGNOSIS — A419 Sepsis, unspecified organism: Secondary | ICD-10-CM | POA: Diagnosis not present

## 2023-01-31 LAB — CBC
HCT: 27.7 % — ABNORMAL LOW (ref 36.0–46.0)
Hemoglobin: 8.8 g/dL — ABNORMAL LOW (ref 12.0–15.0)
MCH: 29.5 pg (ref 26.0–34.0)
MCHC: 31.8 g/dL (ref 30.0–36.0)
MCV: 93 fL (ref 80.0–100.0)
Platelets: 228 10*3/uL (ref 150–400)
RBC: 2.98 MIL/uL — ABNORMAL LOW (ref 3.87–5.11)
RDW: 13.5 % (ref 11.5–15.5)
WBC: 11.1 10*3/uL — ABNORMAL HIGH (ref 4.0–10.5)
nRBC: 0 % (ref 0.0–0.2)

## 2023-01-31 LAB — GLUCOSE, CAPILLARY
Glucose-Capillary: 109 mg/dL — ABNORMAL HIGH (ref 70–99)
Glucose-Capillary: 122 mg/dL — ABNORMAL HIGH (ref 70–99)
Glucose-Capillary: 124 mg/dL — ABNORMAL HIGH (ref 70–99)
Glucose-Capillary: 136 mg/dL — ABNORMAL HIGH (ref 70–99)
Glucose-Capillary: 142 mg/dL — ABNORMAL HIGH (ref 70–99)
Glucose-Capillary: 146 mg/dL — ABNORMAL HIGH (ref 70–99)
Glucose-Capillary: 77 mg/dL (ref 70–99)
Glucose-Capillary: 96 mg/dL (ref 70–99)

## 2023-01-31 LAB — BASIC METABOLIC PANEL
Anion gap: 5 (ref 5–15)
BUN: 9 mg/dL (ref 6–20)
CO2: 35 mmol/L — ABNORMAL HIGH (ref 22–32)
Calcium: 8.1 mg/dL — ABNORMAL LOW (ref 8.9–10.3)
Chloride: 93 mmol/L — ABNORMAL LOW (ref 98–111)
Creatinine, Ser: 0.47 mg/dL (ref 0.44–1.00)
GFR, Estimated: >60 mL/min (ref >60)
Glucose, Bld: 104 mg/dL — ABNORMAL HIGH (ref 70–99)
Potassium: 3.6 mmol/L (ref 3.5–5.1)
Sodium: 133 mmol/L — ABNORMAL LOW (ref 135–145)

## 2023-01-31 LAB — MAGNESIUM: Magnesium: 2.3 mg/dL (ref 1.7–2.4)

## 2023-01-31 MED ORDER — ENSURE ENLIVE PO LIQD
237.0000 mL | Freq: Three times a day (TID) | ORAL | Status: DC
  Administered 2023-01-31 (×2): 237 mL via ORAL

## 2023-01-31 MED ORDER — LOPERAMIDE HCL 2 MG PO CAPS
2.0000 mg | ORAL_CAPSULE | ORAL | Status: AC | PRN
  Administered 2023-01-31 (×3): 2 mg via ORAL
  Filled 2023-01-31 (×3): qty 1

## 2023-01-31 MED ORDER — GABAPENTIN 100 MG PO CAPS
200.0000 mg | ORAL_CAPSULE | Freq: Three times a day (TID) | ORAL | Status: DC
  Administered 2023-01-31 – 2023-02-05 (×14): 200 mg via ORAL
  Filled 2023-01-31 (×14): qty 2

## 2023-01-31 MED ORDER — CLONAZEPAM 0.5 MG PO TBDP
1.0000 mg | ORAL_TABLET | Freq: Once | ORAL | Status: AC
  Administered 2023-01-31: 1 mg via ORAL
  Filled 2023-01-31: qty 2

## 2023-01-31 MED ORDER — CLONAZEPAM 0.5 MG PO TBDP
0.5000 mg | ORAL_TABLET | Freq: Two times a day (BID) | ORAL | Status: DC
  Administered 2023-01-31 – 2023-02-03 (×8): 0.5 mg via ORAL
  Filled 2023-01-31 (×9): qty 1

## 2023-01-31 MED ORDER — MIDODRINE HCL 5 MG PO TABS
5.0000 mg | ORAL_TABLET | Freq: Three times a day (TID) | ORAL | Status: DC
  Administered 2023-01-31 – 2023-02-05 (×15): 5 mg via ORAL
  Filled 2023-01-31 (×17): qty 1

## 2023-01-31 NOTE — Progress Notes (Signed)
 Speech Language Pathology Treatment: Dysphagia  Patient Details Name: Sabrina Roberson MRN: [id number] DOB: 01-02-1971 Today's Date: 01/31/2023 Time: 9179-9161 SLP Time Calculation (min) (ACUTE ONLY): 18 min  Assessment / Plan / Recommendation Clinical Impression  Patient seen this morning to assess readiness for p.o. diet after last night's initial evaluation. She was quite reticent to consume p.o. due to not wanting to be reintubated but today is much more open and willing. Patient observed consuming water, juice, applesauce, and graham cracker. Occasional cough that was not associated with intake noted. Swallow and respiration reciprocity was adequate and patient denies any sensation of retention. She easily passed a 3 ounce yellow water challenge and uses appropriate caution with intake. Reports voice is much improved but still not at baseline and patient continues with congested cough. SLP encouraged her to cough and expectorate as much as able and obtained new suction lines and [place] suction. Provided patient with choice of starting p.o. diet including full liquid versus soft solids to which she preferred full liquid diet therefore SLP wrote order. Advised RN to please monitor patient closely today and recommended to patient and spouse if she is coughing expectorating secretions that are the same color as what she has consumed we need to stop p.o. intake and at that time proceed with MBS study.  Anticipate when patient gets her tube out that this will improve her swallow function further.  Spoke to [facility] nurse practitioner with critical care service who was agreeable to plan. All patient's vital signs remained stable during all p.o. intake and suspect patient's swallow function is near baseline at this time. Will follow closely.   Recommend diet advancement as patient tolerates - deferring to critical care service preference.    HPI HPI: Patient is a 53 year old female admitted to [place]  from [facility] with cough x 2 days prior to admission and altered mental status.  Patient found to have influenza A, shock, ARDS, with increased oxygen needs and was intubated 12 22-12 26.  After extubation patient desatted was placed on BiPAP and salter.  Past medical history positive for gerd, mild asthma, smoker, fibromyalgia, takes care of grandchild, adm from [facility] with cough x2 days prior to admission, found to have influenza A, shock, ARDS, requiring incr oxygen, intubated 12/22 to 12/26- desat then on Salter.  She required reintubation 12/28-01/29/2023.  CXR worsening, other past medical history includes h/o tremors, gait issues, pain, tremor, nausea,  confusion, visual changes, hearing loss, difficulty walking, numbness, weakness and possible Bell's palsy.  She had been worked up previously for potential multiple sclerosis s/p 2015 images, prior gastric emptying study - normal in the past.  CXR 01/30/2023 1. Stable support apparatus.  2. No change in bilateral interstitial and airspace opacities      SLP Plan  Continue with current plan of care      Recommendations for follow up therapy are one component of a multi-disciplinary discharge planning process, led by the attending physician.  Recommendations may be updated based on patient status, additional functional criteria and insurance authorization.    Recommendations  Diet recommendations: Thin liquid (Full liquid per patient preference) Liquids provided via: Straw;Cup Compensations: Slow rate;Small sips/bites Postural Changes and/or Swallow Maneuvers: Upright 30-60 min after meal;Seated upright 90 degrees                  Oral care BID     Dysphagia, unspecified (R13.10)     Continue with current plan of care    Sabrina  MARLA, MS [facility] SLP Acute Rehab Services Office [phone]  Sabrina Roberson  01/31/2023, 9:16 AM

## 2023-01-31 NOTE — Progress Notes (Signed)
 eLink Physician-Brief Progress Note Patient Name: Sabrina Roberson DOB: 12/16/1970 MRN: [id number]   Date of Service  01/31/2023  HPI/Events of Note  53 y.o. female patient with mild asthma on Albuterol  PRN, DM-2, HTN, dyslipidemia, hypothyroidism, GERD, and tobacco smoking (1 ppd for 30 yrs) who presented to ED with AMS, hypoxia 48% on RA, SOB, DOE, for dry cough found to have influenza A associated ARDS requiring mechanical ventilation.  Now extubated.  Baseline severe anxiety and now anxious enough to try to pull out central line and Flexi-Seal.  Klonopin  was DC'd today for unclear reasons.  Takes Valium  3 times daily which has not been resumed  eICU Interventions  Add Klonopin  x 1.  Team to reassess anxiolytics in the morning     Intervention Category Minor Interventions: Agitation / anxiety - evaluation and management  Keary Waterson 01/31/2023, 1:24 AM

## 2023-01-31 NOTE — Progress Notes (Signed)
 NAME:  Prudy Lee Hatfield, MRN:  [id number], DOB:  1970-12-15, LOS: 12 ADMISSION DATE:  01/19/2023, CONSULTATION DATE: 01/19/2023 REFERRING MD: Pamella Ozell LABOR, DO, CHIEF COMPLAINT: AMS and hypoxia today  History of Present Illness:  A 53 y.o. female patient with mild asthma on Albuterol  PRN, DM-2, HTN, dyslipidemia, hypothyroidism, GERD, and tobacco smoking (1 ppd for 30 yrs) who presented to ED with AMS, hypoxia 48% on RA, SOB, DOE, for dry cough for the last 2 days. Today he noted her to be confused and very somnolent while sitting on the couch with labored breathing. Regarding sick contacts, her husband notes the patient was watching a grandchild with recent viral URI symptoms earlier this week. No F/c/r, N/V/D, abd pain, rash, CP, illicit drug use, or alcohol intake.    Pertinent  Medical History  Mild asthma on Albuterol  PRN, DM-2, HTN, dyslipidemia, hypothyroidism, GERD  Significant Hospital Events: Including procedures, antibiotic start and stop dates in addition to other pertinent events   12/22 Admit with ARDS secondary to Influenza A PNA 12/23 Worsening ARDS, septic shock  12/24 Fio2 and PEEP requirements decreasing along with pressors requirements, flagyl  dc'd  12/25 MRSA in tracheal aspirate, on low dose pressors, azithromycin  dc'd  12/26 Extubated 12/28 Re-intubated for respiratory distress/anxiety 12/29-12/31 Diuresing 01/29/2023 extubated   Interim History / Subjective:  Awake and interactive Denies any pain and discomfort Some anxiety overnight  Objective   Blood pressure (!) 101/53, pulse 81, temperature 98 F (36.7 C), temperature source Oral, resp. rate 20, height 5' 4 (1.626 m), weight 61.9 kg, SpO2 98%.        Intake/Output Summary (Last 24 hours) at 01/31/2023 0721 Last data filed at 01/31/2023 9361 Gross per 24 hour  Intake 991.01 ml  Output 3145 ml  Net -2153.99 ml   Filed Weights   01/29/23 0432 01/30/23 0500 01/31/23 0210  Weight: 64.1 kg 60.8 kg 61.9  kg   Physical Exam: General: Chronically ill-appearing HENT: Moist oral mucosa Eyes: Anicteric Respiratory: Decreased air entry bilaterally Cardiovascular: S and S2 appreciated GI: Bowel sounds appreciated Extremities:-No edema, no clubbing Neuro: Awake and alert GU: Foley in place  I reviewed nursing notes, hospitalist notes, last 24 h vitals and pain scores, last 48 h intake and output, last 24 h labs and trends, and last 24 h imaging results.  Resolved Hospital Problem list   Leukopenia resolved Assessment & Plan:   Severe ARDS due to viral pneumonia, influenza A, MRSA pneumonia Past history of asthma Completed Tamiflu  12/27 -Currently on Zyvox   Acute hypoxemic respiratory failure -Continues on oxygen supplementation -Was extubated 01/29/2023  Anxiety -Was on Valium  at home -Will continue low-dose Klonopin  -Klonopin  was discontinued yesterday as she was very sleepy through the day -Patient affirms that she usually takes Valium  at least once a day  Thrombocytopenia in the setting of septic shock -Continue to monitor  Type 2 diabetes -Continue SSI  Hypertension GERD  Active smoker -On nicotine  patch -Plans to stay off cigarettes  Latex allergy   Will wean off pressors Midodrine  added to assist with weaning off pressors To have speech eval today May discontinue nasogastric tube if she passes a swallowing test we will discontinue central line once able to get off pressors  Best Practice (right click and Reselect all SmartList Selections daily)   Diet/type: Swallow evaluation and diet as tolerated DVT prophylaxis  heparin  subcutaneous Pressure ulcer(s): N/A GI prophylaxis: Continue PPI Lines: CVC 12/23  Foley:  Yes, and it is still needed Code Status:  full code Last date of multidisciplinary goals of care discussion -updated spouse at bedside  Discussed with patient and spouse at bedside  The patient is critically ill with multiple organ systems  failure and requires high complexity decision making for assessment and support, frequent evaluation and titration of therapies, application of advanced monitoring technologies and extensive interpretation of multiple databases. Critical Care Time devoted to patient care services described in this note independent of APP/resident time (if applicable)  is 32 minutes.   Jennet Epley MD [facility] Personal pager: See Amion If unanswered, please page CCM On-call: #[phone]

## 2023-01-31 NOTE — Progress Notes (Signed)
 Nutrition Follow-up  DOCUMENTATION CODES:   Not applicable  INTERVENTION:  - Full Liquid diet per SLP.  - Ensure Plus High Protein po TID, each supplement provides 350 kcal and 20 grams of protein. - Encourage intake at all meals and of supplements.   - NGT likely to be discontinued today if patient tolerates a meal/supplements.  - If patient not tolerating diet well or eating/drinking very little and refusing supplements, would recommend continuing tube feeds to meet nutritional needs.  Vital 1.5 at 50 ml/h (1200 ml per day) Prosource TF20 60 ml daily Provides 1880 kcal, 101 gm protein, 917 ml free water daily  - Monitor weight trends.  NUTRITION DIAGNOSIS:   Increased nutrient needs related to acute illness as evidenced by estimated needs. *new  GOAL:   Patient will meet greater than or equal to 90% of their needs *progressing  MONITOR:   PO intake, Supplement acceptance, Diet advancement, Weight trends  REASON FOR ASSESSMENT:   Consult Enteral/tube feeding initiation and management  ASSESSMENT:   53 y.o. female patient with mild asthma on Albuterol  PRN, DM-2, HTN, dyslipidemia, hypothyroidism, GERD, and tobacco smoking (1 ppd for 30 yrs) who presented to ED with AMS, hypoxia 48% on RA, SOB, DOE, for dry cough for the last 2 days. Today he noted her to be confused and very somnolent while sitting on the couch with labored breathing.  12/22 Admit, Intubated 12/23: TF initiated 12/26: Extubated, OGT removed 12/27: SLP eval -> NPO; later developed worsening respiratory distress and re-intubated 12/28: TF started at 90mL/hr 12/30: TF stopped @ 8:30am d/t reflux, restarted @ 4:30pm 12/31: TF stopped in AM for ongoing reflux 1/1: Extubated 1/3 SLP eval -> FLD  Patient advanced to a full liquid diet after SLP eval this AM. CCM note indicates plan to possibly remove NGT if patient passed speech eval.  Spoke to RN who reports patient she passed eval fine but has no  appetite to eat. Per RN, TF paused this AM to give patient a better appetite to eat a meal.  RN reports if patient can tolerate a meal today will very likely remove NGT today.   Visited with patient and husband at bedside. She reports doing okay so far with liquids. She is agreeable to try Ensure to support intake on restrictive diet. Encouraged intake of Ensure for more kcal/protein dense food options on limited diet to be able to have NGT removed.    Admit weight: 139# Current weight: 136# I&O's: -4.3L  Medications reviewed and include: Levophed  @ 2 mcg/min  Labs reviewed:  Na 133 HA1C 6.9 Blood Glucose 112-142 x24 hours   Diet Order:   Diet Order             Diet full liquid Room service appropriate? Yes; Fluid consistency: Thin  Diet effective now                   EDUCATION NEEDS:  Education needs have been addressed  Skin:  Skin Assessment: Reviewed RN Assessment  Last BM:  1/1  Height:  Ht Readings from Last 1 Encounters:  01/24/23 5' 4 (1.626 m)   Weight:  Wt Readings from Last 1 Encounters:  01/31/23 61.9 kg    BMI:  Body mass index is 23.42 kg/m.  Estimated Nutritional Needs:  Kcal:  1700-1900 kcals Protein:  80-100 grams Fluid:  >/= 1.7L   Trude Ned RD, LDN Contact via Secure Chat.

## 2023-02-01 DIAGNOSIS — A419 Sepsis, unspecified organism: Secondary | ICD-10-CM | POA: Diagnosis not present

## 2023-02-01 DIAGNOSIS — R6521 Severe sepsis with septic shock: Secondary | ICD-10-CM | POA: Diagnosis not present

## 2023-02-01 LAB — GLUCOSE, CAPILLARY
Glucose-Capillary: 86 mg/dL (ref 70–99)
Glucose-Capillary: 100 mg/dL — ABNORMAL HIGH (ref 70–99)
Glucose-Capillary: 93 mg/dL (ref 70–99)
Glucose-Capillary: 75 mg/dL (ref 70–99)
Glucose-Capillary: 88 mg/dL (ref 70–99)
Glucose-Capillary: 116 mg/dL — ABNORMAL HIGH (ref 70–99)

## 2023-02-01 LAB — CBC
HCT: 28.1 % — ABNORMAL LOW (ref 36.0–46.0)
Hemoglobin: 8.4 g/dL — ABNORMAL LOW (ref 12.0–15.0)
MCH: 28.2 pg (ref 26.0–34.0)
MCHC: 29.9 g/dL — ABNORMAL LOW (ref 30.0–36.0)
MCV: 94.3 fL (ref 80.0–100.0)
Platelets: 279 10*3/uL (ref 150–400)
RBC: 2.98 MIL/uL — ABNORMAL LOW (ref 3.87–5.11)
RDW: 13.4 % (ref 11.5–15.5)
WBC: 10.7 10*3/uL — ABNORMAL HIGH (ref 4.0–10.5)
nRBC: 0 % (ref 0.0–0.2)

## 2023-02-01 LAB — BASIC METABOLIC PANEL
Anion gap: 6 (ref 5–15)
BUN: 9 mg/dL (ref 6–20)
CO2: 36 mmol/L — ABNORMAL HIGH (ref 22–32)
Calcium: 8.2 mg/dL — ABNORMAL LOW (ref 8.9–10.3)
Chloride: 92 mmol/L — ABNORMAL LOW (ref 98–111)
Creatinine, Ser: 0.65 mg/dL (ref 0.44–1.00)
GFR, Estimated: >60 mL/min (ref >60)
Glucose, Bld: 85 mg/dL (ref 70–99)
Potassium: 3.8 mmol/L (ref 3.5–5.1)
Sodium: 134 mmol/L — ABNORMAL LOW (ref 135–145)

## 2023-02-01 LAB — MAGNESIUM: Magnesium: 2.4 mg/dL (ref 1.7–2.4)

## 2023-02-01 NOTE — Progress Notes (Signed)
 PROGRESS NOTE    Sabrina Roberson  FMW:[id number] DOB: Nov 23, 1970 DOA: 01/19/2023 PCP: Knute Thersia Bitters, FNP   Brief Narrative:  53 year old female with history of mild asthma, diabetes mellitus type 2, hypertension, dyslipidemia, hypothyroidism, GERD, tobacco smoking presented with worsening shortness of breath and cough.  She was found to be very hypoxic on presentation and was intubated and admitted to ICU.  She was found to have influenza A pneumonia with ARDS along with septic shock and started on broad-spectrum antibiotics and Tamiflu .  Subsequently, tracheal aspirate was positive for MRSA: Antibiotic switched to linezolid .  She was extubated on 01/23/2023 but was reintubated on 01/25/2023 for increasing respiratory distress/anxiety.  She also received few days of IV diuretics.  She was extubated again on 01/29/2023.  She has been weaned off pressors.  She was transferred to TRH service from 02/01/2023 onwards.  Assessment & Plan:   Acute respiratory failure with hypoxia ARDS Influenza A pneumonia MRSA pneumonia Past history of mild asthma -Required intubation twice during this hospitalization and was finally extubated again on 01/29/2023.  Currently on 4 L oxygen via nasal cannula.  Wean off as able.  Care transferred to TRH service from 02/01/2023 onwards and pulmonary has signed off. -2D echo had shown EF of 50 to 55%. -Patient received a few days of IV diuretics.  Currently not on any diuretics. -Completed Tamiflu  on 01/24/2023.  Today is day #7 of Zyvox .  DC after today's doses.  Septic shock: Present on admission -Antibiotic plan as below.  Blood pressure still on the lower side but stable.  Continue midodrine .  Leukocytosis -Resolved  Anemia of chronic disease -From chronic illnesses.  Hemoglobin currently stable.  Monitor intermittently  Hyponatremia -Mild.  Monitor.  Encourage oral intake.  Hypokalemia -Resolved  Diabetes mellitus type 2 -Continue SSI  Anxiety -Was  on Valium  at home.  Currently on low-dose Klonopin .  Physical deconditioning -Continue PT eval.  PT recommending home health PT  Poor oral intake -Encouraged oral intake.  Still has ongoing tube feeding.  Dietitian following.  Diet as per SLP recommendations.  Tobacco abuse -Patient is an active smoker prior to presentation.  Counseled regarding cessation by [facility] provider.  Currently on nicotine  patch  GERD -Continue Protonix   Hyperlipidemia -Continue statin    DVT prophylaxis: Heparin  subcutaneous Code Status: Full Family Communication: Husband and family member at bedside Disposition Plan: Status is: Inpatient Remains inpatient appropriate because: Of severity of illness  Consultants: PCCM  Procedures: As above  Antimicrobials:  Anti-infectives (From admission, onward)    Start     Dose/Rate Route Frequency Ordered Stop   01/26/23 1115  linezolid  (ZYVOX ) IVPB 600 mg        600 mg 300 mL/hr over 60 Minutes Intravenous Every 12 hours 01/26/23 1020 02/01/23 2359   01/24/23 1000  oseltamivir  (TAMIFLU ) capsule 75 mg        75 mg Oral 2 times daily 01/24/23 0807 01/24/23 2159   01/21/23 2200  vancomycin  (VANCOCIN ) 750 mg in sodium chloride  0.9 % 250 mL IVPB  Status:  Discontinued        750 mg 250 mL/hr over 60 Minutes Intravenous Every 12 hours 01/21/23 1939 01/26/23 1020   01/21/23 1000  vancomycin  (VANCOREADY) IVPB 750 mg/150 mL  Status:  Discontinued        750 mg 150 mL/hr over 60 Minutes Intravenous Every 12 hours 01/21/23 0833 01/21/23 0835   01/21/23 1000  vancomycin  (VANCOCIN ) 750 mg in sodium chloride  0.9 % 250  mL IVPB  Status:  Discontinued        750 mg 265 mL/hr over 60 Minutes Intravenous Every 12 hours 01/21/23 0835 01/21/23 1939   01/20/23 1800  vancomycin  (VANCOCIN ) IVPB 1000 mg/200 mL premix  Status:  Discontinued        1,000 mg 200 mL/hr over 60 Minutes Intravenous Every 24 hours 01/19/23 2359 01/21/23 0833   01/20/23 1230  azithromycin  (ZITHROMAX )  500 mg in sodium chloride  0.9 % 250 mL IVPB  Status:  Discontinued        500 mg 250 mL/hr over 60 Minutes Intravenous Daily 01/20/23 1150 01/22/23 0824   01/20/23 0600  vancomycin  (VANCOREADY) IVPB 750 mg/150 mL  Status:  Discontinued        750 mg 150 mL/hr over 60 Minutes Intravenous Every 12 hours 01/19/23 2334 01/19/23 2346   01/20/23 0200  cefTRIAXone  (ROCEPHIN ) 1 g in sodium chloride  0.9 % 100 mL IVPB  Status:  Discontinued        1 g 200 mL/hr over 30 Minutes Intravenous Every 24 hours 01/19/23 2334 01/19/23 2345   01/20/23 0200  metroNIDAZOLE  (FLAGYL ) IVPB 500 mg  Status:  Discontinued        500 mg 100 mL/hr over 60 Minutes Intravenous Every 12 hours 01/19/23 2334 01/21/23 0742   01/20/23 0200  cefTRIAXone  (ROCEPHIN ) 2 g in sodium chloride  0.9 % 100 mL IVPB  Status:  Discontinued        2 g 200 mL/hr over 30 Minutes Intravenous Every 24 hours 01/19/23 2346 01/22/23 0942   01/19/23 2200  oseltamivir  (TAMIFLU ) capsule 75 mg  Status:  Discontinued        75 mg Per Tube 2 times daily 01/19/23 1952 01/24/23 0807   01/19/23 1845  vancomycin  (VANCOCIN ) 500 mg in sodium chloride  0.9 % 100 mL IVPB  Status:  Discontinued       Placed in Followed by Linked Group   500 mg 100 mL/hr over 60 Minutes Intravenous  Once 01/19/23 1730 01/19/23 2042   01/19/23 1730  vancomycin  (VANCOCIN ) IVPB 1000 mg/200 mL premix       Placed in Followed by Linked Group   1,000 mg 200 mL/hr over 60 Minutes Intravenous  Once 01/19/23 1730 01/19/23 2153   01/19/23 1730  vancomycin  (VANCOREADY) IVPB 1750 mg/350 mL  Status:  Discontinued        1,750 mg 175 mL/hr over 120 Minutes Intravenous  Once 01/19/23 1728 01/19/23 1730   01/19/23 1730  piperacillin -tazobactam (ZOSYN ) IVPB 3.375 g        3.375 g 12.5 mL/hr over 240 Minutes Intravenous  Once 01/19/23 1728 01/19/23 1910   01/19/23 1729  vancomycin  (VANCOCIN ) 1000 MG powder       Note to Pharmacy: Jonne Slough: cabinet override      01/19/23 1729  01/19/23 1805   01/19/23 1729  vancomycin  (VANCOCIN ) 750 MG injection       Note to Pharmacy: Jonne Slough: cabinet override      01/19/23 1729 01/20/23 0544        Subjective: Patient seen and examined at bedside.  Feels slightly better.  No fever, vomiting, agitation reported.  Appetite is still not adequate.  Objective: Vitals:   02/01/23 0745 02/01/23 0800 02/01/23 0900 02/01/23 1000  BP:  (!) 92/42 (!) 88/37 (!) 92/41  Pulse:  67 62 70  Resp:  19 19 (!) 22  Temp: 98.2 F (36.8 C)     TempSrc: Oral  SpO2:  98% 98% 96%  Weight:      Height:        Intake/Output Summary (Last 24 hours) at 02/01/2023 1125 Last data filed at 01/31/2023 2313 Gross per 24 hour  Intake 1781.66 ml  Output --  Net 1781.66 ml   Filed Weights   01/30/23 0500 01/31/23 0210 02/01/23 0500  Weight: 60.8 kg 61.9 kg 61.5 kg    Examination:  General exam: Appears calm and comfortable.  Looks chronically ill and deconditioned.  On 4 L oxygen management well. ENT: NG tube present Respiratory system: Bilateral decreased breath sounds at bases with some scattered crackles Cardiovascular system: S1 & S2 heard, Rate controlled Gastrointestinal system: Abdomen is nondistended, soft and nontender. Normal bowel sounds heard. Extremities: No cyanosis, clubbing, edema  Central nervous system: Awake, slow to respond.  No focal neurological deficits. Moving extremities Skin: No rashes, lesions or ulcers Psychiatry: Flat affect.  Not agitated.   Data Reviewed: I have personally reviewed following labs and imaging studies  CBC: Recent Labs  Lab 01/28/23 0503 01/29/23 0438 01/30/23 0413 01/31/23 0534 02/01/23 0444  WBC 21.0* 15.8* 16.5* 11.1* 10.7*  HGB 8.9* 8.4* 9.2* 8.8* 8.4*  HCT 28.9* 26.7* 29.3* 27.7* 28.1*  MCV 92.9 91.8 91.8 93.0 94.3  PLT 187 215 237 228 279   Basic Metabolic Panel: Recent Labs  Lab 01/25/23 1658 01/26/23 0427 01/26/23 1714 01/27/23 0536 01/28/23 0503  01/28/23 1426 01/29/23 0438 01/29/23 1627 01/30/23 0413 01/31/23 0534 02/01/23 0444  NA  --  142  --    < > 130*   < > 136 134* 131* 133* 134*  K 3.7 3.2* 3.0*   < > 2.6*   < > 2.9* 3.3* 4.1 3.6 3.8  CL  --  100  --    < > 91*   < > 93* 91* 93* 93* 92*  CO2  --  33*  --    < > 31   < > 35* 35* 31 35* 36*  GLUCOSE  --  137*  --    < > 116*   < > 95 95 111* 104* 85  BUN  --  24*  --    < > 15   < > 17 13 11 9 9   CREATININE  --  0.80  --    < > 0.66   < > 0.68 0.64 0.66 0.47 0.65  CALCIUM   --  7.5*  --    < > 7.3*   < > 7.6* 7.6* 7.0* 8.1* 8.2*  MG 2.1 2.0 2.1  --  1.9  --  2.1  --  1.8 2.3 2.4  PHOS 3.4 2.7 2.8  --   --   --   --   --   --   --   --    < > = values in this interval not displayed.   GFR: Estimated Creatinine Clearance: 72.6 mL/min (by C-G formula based on SCr of 0.65 mg/dL). Liver Function Tests: No results for input(s): AST, ALT, ALKPHOS, BILITOT, PROT, ALBUMIN in the last 168 hours. No results for input(s): LIPASE, AMYLASE in the last 168 hours. No results for input(s): AMMONIA in the last 168 hours. Coagulation Profile: No results for input(s): INR, PROTIME in the last 168 hours. Cardiac Enzymes: No results for input(s): CKTOTAL, CKMB, CKMBINDEX, TROPONINI in the last 168 hours. BNP (last 3 results) No results for input(s): PROBNP in the last 8760 hours. HbA1C: No results for input(s): HGBA1C  in the last 72 hours. CBG: Recent Labs  Lab 01/31/23 1538 01/31/23 1951 01/31/23 2351 02/01/23 0353 02/01/23 0755  GLUCAP 77 96 136* 86 100*   Lipid Profile: No results for input(s): CHOL, HDL, LDLCALC, TRIG, CHOLHDL, LDLDIRECT in the last 72 hours. Thyroid  Function Tests: No results for input(s): TSH, T4TOTAL, FREET4, T3FREE, THYROIDAB in the last 72 hours. Anemia Panel: No results for input(s): VITAMINB12, FOLATE, FERRITIN, TIBC, IRON, RETICCTPCT in the last 72 hours. Sepsis Labs: No results  for input(s): PROCALCITON, LATICACIDVEN in the last 168 hours.  Recent Results (from the past 240 hours)  Culture, Respiratory w Gram Stain     Status: None   Collection Time: 01/26/23  7:46 AM   Specimen: Tracheal Aspirate; Respiratory  Result Value Ref Range Status   Specimen Description   Final    TRACHEAL ASPIRATE Performed at [facility], [address], [place], [place] [address]    Special Requests   Final    NONE Performed at [facility], [address] [place], [place] [address]    Gram Stain   Final    FEW WBC SEEN FEW SQUAMOUS EPITHELIAL CELLS PRESENT FEW GRAM POSITIVE COCCI Performed at [facility], [address], [place], [place] [address]    Culture   Final    MODERATE METHICILLIN RESISTANT STAPHYLOCOCCUS AUREUS   Report Status 01/28/2023 FINAL  Final   Organism ID, Bacteria METHICILLIN RESISTANT STAPHYLOCOCCUS AUREUS  Final      Susceptibility   Methicillin resistant staphylococcus aureus - MIC*    CIPROFLOXACIN >=8 RESISTANT Resistant     ERYTHROMYCIN >=8 RESISTANT Resistant     GENTAMICIN <=0.5 SENSITIVE Sensitive     OXACILLIN >=4 RESISTANT Resistant     TETRACYCLINE <=1 SENSITIVE Sensitive     VANCOMYCIN  1 SENSITIVE Sensitive     TRIMETH/SULFA >=320 RESISTANT Resistant     CLINDAMYCIN <=0.25 SENSITIVE Sensitive     RIFAMPIN <=0.5 SENSITIVE Sensitive     Inducible Clindamycin NEGATIVE Sensitive     LINEZOLID  2 SENSITIVE Sensitive     * MODERATE METHICILLIN RESISTANT STAPHYLOCOCCUS AUREUS         Radiology Studies: No results found.      Scheduled Meds:  atorvastatin   20 mg Per Tube Daily   Chlorhexidine  Gluconate Cloth  6 each Topical Q2200   clonazepam   0.5 mg Oral BID   feeding supplement  237 mL Oral TID BM   feeding supplement (PROSource TF20)  60 mL Per Tube Daily   gabapentin   200 mg Oral TID   heparin  injection (subcutaneous)  5,000 Units Subcutaneous Q8H   insulin  aspart  0-9  Units Subcutaneous Q4H   levothyroxine   100 mcg Per Tube QAC breakfast   midodrine   5 mg Oral TID WC   nicotine   14 mg Transdermal Daily   Followed by   NOREEN ON 02/07/2023] nicotine   7 mg Transdermal Daily   pantoprazole  (PROTONIX ) IV  40 mg Intravenous QHS   sodium chloride  flush  3-10 mL Intravenous Q12H   Continuous Infusions:  sodium chloride  Stopped (01/27/23 0528)   feeding supplement (VITAL 1.5 CAL) Stopped (01/31/23 1500)   linezolid  (ZYVOX ) IV 600 mg (02/01/23 1017)   norepinephrine  (LEVOPHED ) Adult infusion Stopped (01/31/23 0801)          Sophie Mao, MD [facility] 02/01/2023, 11:25 AM

## 2023-02-01 NOTE — Progress Notes (Signed)
 eLink Physician-Brief Progress Note Patient Name: Sabrina Roberson DOB: 22-Mar-1970 MRN: [id number]   Date of Service  02/01/2023  HPI/Events of Note  BP softer when she is asleep, (she's been trying to let her sleep because she was more confused earlier). Current BP is 84/34 (52)  Previously on norepinephrine  nocturnally.  eICU Interventions  Given stable mental status and other hemodynamics.  Can continue to monitor.  If systolics below 80, can initiate vasopressors.     Intervention Category Intermediate Interventions: Hypotension - evaluation and management  Virgil Slinger 02/01/2023, 1:43 AM

## 2023-02-01 NOTE — Evaluation (Signed)
 Occupational Therapy Evaluation Patient Details Name: Sabrina Roberson MRN: [id number] DOB: [birth year] Today's Date: 02/01/2023   History of Present Illness A 53 y.o. female patient who presented to ED 01/19/23 with AMS, hypoxia,septic shock,  positive influenza A,required intubation x 2, extubated 01/29/23. EFY:tpuy mild asthma on Albuterol  PRN, DM-2, HTN, dyslipidemia, hypothyroidism, GERD, and tobacco smoking (1 ppd for 30 yrs)   Clinical Impression   Patient is a 53 year old female who was admitted for above. Patient was living at home with husband support with unsteady gait pattern at baseline per patient and husband report. Patient is eager to get back home to be able to participate in time with grandchildren. Patient was noted to have decreased functional activity tolerance, decreased endurance, decreased standing balance, decreased safety awareness, and decreased knowledge of AD/AE impacting participation in ADLs. Patient plans to d/c home with family support and [facility] services. Patient would continue to benefit from skilled OT services at this time while admitted and after d/c to address noted deficits in order to improve overall safety and independence in ADLs.        If plan is discharge home, recommend the following: A little help with walking and/or transfers;A little help with bathing/dressing/bathroom;Assistance with cooking/housework;Direct supervision/assist for medications management;Help with stairs or ramp for entrance;Direct supervision/assist for financial management;Assist for transportation    Functional Status Assessment  Patient has had a recent decline in their functional status and demonstrates the ability to make significant improvements in function in a reasonable and predictable amount of time.  Equipment Recommendations  None recommended by OT       Precautions / Restrictions Precautions Precautions: Fall Precaution Comments: NG tube feed, Restrictions Weight  Bearing Restrictions Per Provider Order: No      Mobility Bed Mobility Overal bed mobility: Needs Assistance Bed Mobility: Sit to Supine   Sidelying to sit: Min assist   Sit to supine: Supervision              Balance Overall balance assessment: Needs assistance Sitting-balance support: Bilateral upper extremity supported, Feet supported Sitting balance-Leahy Scale: Fair     Standing balance support: Bilateral upper extremity supported, During functional activity Standing balance-Leahy Scale: Poor           ADL either performed or assessed with clinical judgement   ADL Overall ADL's : Needs assistance/impaired   Eating/Feeding Details (indicate cue type and reason): declined to eat husband said they are gonna work on it later. Grooming: Sitting;Minimal assistance;Brushing hair Grooming Details (indicate cue type and reason): EOB Upper Body Bathing: Minimal assistance;Sitting   Lower Body Bathing: Moderate assistance;Sitting/lateral leans   Upper Body Dressing : Minimal assistance;Sitting   Lower Body Dressing: Moderate assistance;Sitting/lateral leans   Toilet Transfer: Minimal assistance Toilet Transfer Details (indicate cue type and reason): to take side steps up [facility] Toileting- Clothing Manipulation and Hygiene: Maximal assistance;Sit to/from stand               Vision Patient Visual Report: No change from baseline              Pertinent Vitals/Pain Pain Assessment Pain Assessment: No/denies pain     Extremity/Trunk Assessment Upper Extremity Assessment Upper Extremity Assessment: Overall WFL for tasks assessed   Lower Extremity Assessment Lower Extremity Assessment: Defer to PT evaluation   Cervical / Trunk Assessment Cervical / Trunk Exceptions: mild tremors of head/body with sitting EOB. reported it was typical.   Communication Communication Communication: No apparent difficulties   Cognition Arousal:  Alert Behavior During Therapy:  WFL for tasks assessed/performed Overall Cognitive Status: Impaired/Different from baseline     General Comments: patient was noted to have some confusion during session but appropirate overall.                Home Living Family/patient expects to be discharged to:: Private residence Living Arrangements: Spouse/significant other;Children;Other relatives Available Help at Discharge: Family;Available 24 hours/day Type of Home: House Home Access: Stairs to enter     Home Layout: One level         Bathroom Toilet: Standard     Home Equipment: None          Prior Functioning/Environment Prior Level of Function : Independent/Modified Independent             Mobility Comments: takes care of a 3  and 5 YO grandchildren ADLs Comments: Independent in ADLs        OT Problem List: Decreased activity tolerance;Impaired balance (sitting and/or standing);Decreased coordination;Decreased safety awareness;Decreased knowledge of precautions;Decreased knowledge of use of DME or AE      OT Treatment/Interventions: Self-care/ADL training;DME and/or AE instruction;Patient/family education;Therapeutic activities;Therapeutic exercise;Balance training    OT Goals(Current goals can be found in the care plan section) Acute Rehab OT Goals Patient Stated Goal: to go home to grandchildren OT Goal Formulation: With patient/family Time For Goal Achievement: 02/15/23 Potential to Achieve Goals: Fair  OT Frequency: Min 1X/week       AM-PAC OT 6 Clicks Daily Activity     Outcome Measure Help from another person eating meals?: A Little Help from another person taking care of personal grooming?: A Little Help from another person toileting, which includes using toliet, bedpan, or urinal?: A Lot Help from another person bathing (including washing, rinsing, drying)?: A Lot Help from another person to put on and taking off regular upper body clothing?: A Little Help from another person to  put on and taking off regular lower body clothing?: A Lot 6 Click Score: 15   End of Session Equipment Utilized During Treatment: Gait belt;Rolling walker (2 wheels) Nurse Communication: Mobility status  Activity Tolerance: Patient tolerated treatment well Patient left: in bed;with call bell/phone within reach;with family/visitor present  OT Visit Diagnosis: Unsteadiness on feet (R26.81);Other abnormalities of gait and mobility (R26.89)                Time: 9062-9041 OT Time Calculation (min): 21 min Charges:  OT General Charges $OT Visit: 1 Visit OT Evaluation $OT Eval Low Complexity: 1 Low  Kaian Fahs OTR/L, MS Acute Rehabilitation Department Office# [phone]   Geofm CHRISTELLA Dance 02/01/2023, 12:31 PM

## 2023-02-02 DIAGNOSIS — R6521 Severe sepsis with septic shock: Secondary | ICD-10-CM | POA: Diagnosis not present

## 2023-02-02 DIAGNOSIS — A419 Sepsis, unspecified organism: Secondary | ICD-10-CM | POA: Diagnosis not present

## 2023-02-02 LAB — BASIC METABOLIC PANEL
Anion gap: 8 (ref 5–15)
BUN: 12 mg/dL (ref 6–20)
CO2: 34 mmol/L — ABNORMAL HIGH (ref 22–32)
Calcium: 8.4 mg/dL — ABNORMAL LOW (ref 8.9–10.3)
Chloride: 94 mmol/L — ABNORMAL LOW (ref 98–111)
Creatinine, Ser: 0.46 mg/dL (ref 0.44–1.00)
GFR, Estimated: >60 mL/min (ref >60)
Glucose, Bld: 81 mg/dL (ref 70–99)
Potassium: 3.8 mmol/L (ref 3.5–5.1)
Sodium: 136 mmol/L (ref 135–145)

## 2023-02-02 LAB — GLUCOSE, CAPILLARY
Glucose-Capillary: 72 mg/dL (ref 70–99)
Glucose-Capillary: 92 mg/dL (ref 70–99)
Glucose-Capillary: 98 mg/dL (ref 70–99)
Glucose-Capillary: 88 mg/dL (ref 70–99)
Glucose-Capillary: 87 mg/dL (ref 70–99)

## 2023-02-02 LAB — CBC
HCT: 26.6 % — ABNORMAL LOW (ref 36.0–46.0)
Hemoglobin: 8.3 g/dL — ABNORMAL LOW (ref 12.0–15.0)
MCH: 29.6 pg (ref 26.0–34.0)
MCHC: 31.2 g/dL (ref 30.0–36.0)
MCV: 95 fL (ref 80.0–100.0)
Platelets: 315 10*3/uL (ref 150–400)
RBC: 2.8 MIL/uL — ABNORMAL LOW (ref 3.87–5.11)
RDW: 13.4 % (ref 11.5–15.5)
WBC: 11.1 10*3/uL — ABNORMAL HIGH (ref 4.0–10.5)
nRBC: 0 % (ref 0.0–0.2)

## 2023-02-02 LAB — MAGNESIUM: Magnesium: 2.4 mg/dL (ref 1.7–2.4)

## 2023-02-02 NOTE — Plan of Care (Signed)

## 2023-02-02 NOTE — Progress Notes (Signed)
 PROGRESS NOTE    Sabrina Roberson  FMW:[id number] DOB: [birth year] DOA: 01/19/2023 PCP: Knute Thersia Bitters, FNP   Brief Narrative:  53 year old female with history of mild asthma, diabetes mellitus type 2, hypertension, dyslipidemia, hypothyroidism, GERD, tobacco smoking presented with worsening shortness of breath and cough.  She was found to be very hypoxic on presentation and was intubated and admitted to ICU.  She was found to have influenza A pneumonia with ARDS along with septic shock and started on broad-spectrum antibiotics and Tamiflu .  Subsequently, tracheal aspirate was positive for MRSA: Antibiotic switched to linezolid .  She was extubated on 01/23/2023 but was reintubated on 01/25/2023 for increasing respiratory distress/anxiety.  She also received few days of IV diuretics.  She was extubated again on 01/29/2023.  She has been weaned off pressors.  She was transferred to TRH service from 02/01/2023 onwards.  Assessment & Plan:   Acute respiratory failure with hypoxia ARDS Influenza A pneumonia MRSA pneumonia Past history of mild asthma -Required intubation twice during this hospitalization and was finally extubated again on 01/29/2023.  Currently on 2 L oxygen via nasal cannula.  Wean off as able.  Care transferred to TRH service from 02/01/2023 onwards and pulmonary has signed off. -2D echo had shown EF of 50 to 55%. -Patient received a few days of IV diuretics.  Currently not on any diuretics. -Completed Tamiflu  on 01/24/2023.  DC'd Zyvox  on 02/01/2023 after 7 days of treatment.  Septic shock: Present on admission -Antibiotic plan as below.  Blood pressure still on the lower side but stable.  Continue midodrine . -Shock has resolved.  Leukocytosis -Mild.  Monitor.  Anemia of chronic disease -From chronic illnesses.  Hemoglobin currently stable.  Monitor intermittently  Hyponatremia -Resolved   Hypokalemia -Resolved  Diabetes mellitus type 2 -Continue SSI  Anxiety -Was  on Valium  at home.  Currently on low-dose Klonopin .  Physical deconditioning -PT recommending home health PT  Poor oral intake -Encouraged oral intake.  Still has ongoing tube feeding.  Dietitian following.  Diet as per SLP recommendations. -Oral intake improving.  If continues to improve, will possibly remove tube tomorrow.  Tobacco abuse -Patient is an active smoker prior to presentation.  Counseled regarding cessation by [facility] provider.  Currently on nicotine  patch  GERD -Continue Protonix   Hyperlipidemia -Continue statin    DVT prophylaxis: Heparin  subcutaneous Code Status: Full Family Communication: Husband at bedside Disposition Plan: Status is: Inpatient Remains inpatient appropriate because: Of severity of illness.  Anticipate discharge in 48 hours if tube gets removed tomorrow and oral intake keeps improving.  Consultants: PCCM  Procedures: As above  Antimicrobials:  Anti-infectives (From admission, onward)    Start     Dose/Rate Route Frequency Ordered Stop   01/26/23 1115  linezolid  (ZYVOX ) IVPB 600 mg        600 mg 300 mL/hr over 60 Minutes Intravenous Every 12 hours 01/26/23 1020 02/01/23 2238   01/24/23 1000  oseltamivir  (TAMIFLU ) capsule 75 mg        75 mg Oral 2 times daily 01/24/23 0807 01/24/23 2159   01/21/23 2200  vancomycin  (VANCOCIN ) 750 mg in sodium chloride  0.9 % 250 mL IVPB  Status:  Discontinued        750 mg 250 mL/hr over 60 Minutes Intravenous Every 12 hours 01/21/23 1939 01/26/23 1020   01/21/23 1000  vancomycin  (VANCOREADY) IVPB 750 mg/150 mL  Status:  Discontinued        750 mg 150 mL/hr over 60 Minutes Intravenous Every 12  hours 01/21/23 0833 01/21/23 0835   01/21/23 1000  vancomycin  (VANCOCIN ) 750 mg in sodium chloride  0.9 % 250 mL IVPB  Status:  Discontinued        750 mg 265 mL/hr over 60 Minutes Intravenous Every 12 hours 01/21/23 0835 01/21/23 1939   01/20/23 1800  vancomycin  (VANCOCIN ) IVPB 1000 mg/200 mL premix  Status:   Discontinued        1,000 mg 200 mL/hr over 60 Minutes Intravenous Every 24 hours 01/19/23 2359 01/21/23 0833   01/20/23 1230  azithromycin  (ZITHROMAX ) 500 mg in sodium chloride  0.9 % 250 mL IVPB  Status:  Discontinued        500 mg 250 mL/hr over 60 Minutes Intravenous Daily 01/20/23 1150 01/22/23 0824   01/20/23 0600  vancomycin  (VANCOREADY) IVPB 750 mg/150 mL  Status:  Discontinued        750 mg 150 mL/hr over 60 Minutes Intravenous Every 12 hours 01/19/23 2334 01/19/23 2346   01/20/23 0200  cefTRIAXone  (ROCEPHIN ) 1 g in sodium chloride  0.9 % 100 mL IVPB  Status:  Discontinued        1 g 200 mL/hr over 30 Minutes Intravenous Every 24 hours 01/19/23 2334 01/19/23 2345   01/20/23 0200  metroNIDAZOLE  (FLAGYL ) IVPB 500 mg  Status:  Discontinued        500 mg 100 mL/hr over 60 Minutes Intravenous Every 12 hours 01/19/23 2334 01/21/23 0742   01/20/23 0200  cefTRIAXone  (ROCEPHIN ) 2 g in sodium chloride  0.9 % 100 mL IVPB  Status:  Discontinued        2 g 200 mL/hr over 30 Minutes Intravenous Every 24 hours 01/19/23 2346 01/22/23 0942   01/19/23 2200  oseltamivir  (TAMIFLU ) capsule 75 mg  Status:  Discontinued        75 mg Per Tube 2 times daily 01/19/23 1952 01/24/23 0807   01/19/23 1845  vancomycin  (VANCOCIN ) 500 mg in sodium chloride  0.9 % 100 mL IVPB  Status:  Discontinued       Placed in Followed by Linked Group   500 mg 100 mL/hr over 60 Minutes Intravenous  Once 01/19/23 1730 01/19/23 2042   01/19/23 1730  vancomycin  (VANCOCIN ) IVPB 1000 mg/200 mL premix       Placed in Followed by Linked Group   1,000 mg 200 mL/hr over 60 Minutes Intravenous  Once 01/19/23 1730 01/19/23 2153   01/19/23 1730  vancomycin  (VANCOREADY) IVPB 1750 mg/350 mL  Status:  Discontinued        1,750 mg 175 mL/hr over 120 Minutes Intravenous  Once 01/19/23 1728 01/19/23 1730   01/19/23 1730  piperacillin -tazobactam (ZOSYN ) IVPB 3.375 g        3.375 g 12.5 mL/hr over 240 Minutes Intravenous  Once 01/19/23 1728  01/19/23 1910   01/19/23 1729  vancomycin  (VANCOCIN ) 1000 MG powder       Note to Pharmacy: Jonne Slough: cabinet override      01/19/23 1729 01/19/23 1805   01/19/23 1729  vancomycin  (VANCOCIN ) 750 MG injection       Note to Pharmacy: Jonne Slough: cabinet override      01/19/23 1729 01/20/23 0544        Subjective: Patient seen and examined at bedside.  Oral intake is improving.  Feels slightly better today.  She feels weak.  Still has some cough.  No fever, vomiting reported.  Objective: Vitals:   02/02/23 0500 02/02/23 0600 02/02/23 0700 02/02/23 0800  BP: (!) 101/49 105/77 (!) 84/57 (!) 97/49  Pulse: 73 72 73 66  Resp: (!) 24 (!) 23 (!) 22 (!) 22  Temp:    97.8 F (36.6 C)  TempSrc:    Axillary  SpO2: 95% 98% 100% 98%  Weight: 61.8 kg     Height:        Intake/Output Summary (Last 24 hours) at 02/02/2023 0843 Last data filed at 02/01/2023 2349 Gross per 24 hour  Intake 316.54 ml  Output --  Net 316.54 ml   Filed Weights   01/31/23 0210 02/01/23 0500 02/02/23 0500  Weight: 61.9 kg 61.5 kg 61.8 kg    Examination:  General: On 2 L oxygen via nasal cannula.  No distress.  Looks chronically ill and deconditioned. ENT/neck: No thyromegaly.  JVD is not elevated.  NGT present. respiratory: Decreased breath sounds at bases bilaterally with some crackles; no wheezing.  Intermittently tachypneic CVS: S1-S2 heard, rate controlled currently Abdominal: Soft, nontender, slightly distended; no organomegaly, bowel sounds are heard Extremities: Trace lower extremity edema; no cyanosis  CNS: Awake and alert.  Still slow to respond.  No focal neurologic deficit.  Moves extremities Lymph: No obvious lymphadenopathy Skin: No obvious ecchymosis/lesions  psych: Mostly flat affect.  Not agitated currently. musculoskeletal: No obvious joint swelling/deformity    Data Reviewed: I have personally reviewed following labs and imaging studies  CBC: Recent Labs  Lab 01/29/23 0438  01/30/23 0413 01/31/23 0534 02/01/23 0444 02/02/23 0344  WBC 15.8* 16.5* 11.1* 10.7* 11.1*  HGB 8.4* 9.2* 8.8* 8.4* 8.3*  HCT 26.7* 29.3* 27.7* 28.1* 26.6*  MCV 91.8 91.8 93.0 94.3 95.0  PLT 215 237 228 279 315   Basic Metabolic Panel: Recent Labs  Lab 01/26/23 1714 01/27/23 0536 01/29/23 0438 01/29/23 1627 01/30/23 0413 01/31/23 0534 02/01/23 0444 02/02/23 0344  NA  --    < > 136 134* 131* 133* 134* 136  K 3.0*   < > 2.9* 3.3* 4.1 3.6 3.8 3.8  CL  --    < > 93* 91* 93* 93* 92* 94*  CO2  --    < > 35* 35* 31 35* 36* 34*  GLUCOSE  --    < > 95 95 111* 104* 85 81  BUN  --    < > 17 13 11 9 9 12   CREATININE  --    < > 0.68 0.64 0.66 0.47 0.65 0.46  CALCIUM   --    < > 7.6* 7.6* 7.0* 8.1* 8.2* 8.4*  MG 2.1   < > 2.1  --  1.8 2.3 2.4 2.4  PHOS 2.8  --   --   --   --   --   --   --    < > = values in this interval not displayed.   GFR: Estimated Creatinine Clearance: 72.6 mL/min (by C-G formula based on SCr of 0.46 mg/dL). Liver Function Tests: No results for input(s): AST, ALT, ALKPHOS, BILITOT, PROT, ALBUMIN in the last 168 hours. No results for input(s): LIPASE, AMYLASE in the last 168 hours. No results for input(s): AMMONIA in the last 168 hours. Coagulation Profile: No results for input(s): INR, PROTIME in the last 168 hours. Cardiac Enzymes: No results for input(s): CKTOTAL, CKMB, CKMBINDEX, TROPONINI in the last 168 hours. BNP (last 3 results) No results for input(s): PROBNP in the last 8760 hours. HbA1C: No results for input(s): HGBA1C in the last 72 hours. CBG: Recent Labs  Lab 02/01/23 1548 02/01/23 1924 02/01/23 2324 02/02/23 0333 02/02/23 0749  GLUCAP 75 88  116* 72 92   Lipid Profile: No results for input(s): CHOL, HDL, LDLCALC, TRIG, CHOLHDL, LDLDIRECT in the last 72 hours. Thyroid  Function Tests: No results for input(s): TSH, T4TOTAL, FREET4, T3FREE, THYROIDAB in the last 72 hours. Anemia  Panel: No results for input(s): VITAMINB12, FOLATE, FERRITIN, TIBC, IRON, RETICCTPCT in the last 72 hours. Sepsis Labs: No results for input(s): PROCALCITON, LATICACIDVEN in the last 168 hours.  Recent Results (from the past 240 hours)  Culture, Respiratory w Gram Stain     Status: None   Collection Time: 01/26/23  7:46 AM   Specimen: Tracheal Aspirate; Respiratory  Result Value Ref Range Status   Specimen Description   Final    TRACHEAL ASPIRATE Performed at [facility], [address], [place], [place] [address]    Special Requests   Final    NONE Performed at [facility], [address] [place], [place] [address]    Gram Stain   Final    FEW WBC SEEN FEW SQUAMOUS EPITHELIAL CELLS PRESENT FEW GRAM POSITIVE COCCI Performed at [facility], [address], [place], [place] [address]    Culture   Final    MODERATE METHICILLIN RESISTANT STAPHYLOCOCCUS AUREUS   Report Status 01/28/2023 FINAL  Final   Organism ID, Bacteria METHICILLIN RESISTANT STAPHYLOCOCCUS AUREUS  Final      Susceptibility   Methicillin resistant staphylococcus aureus - MIC*    CIPROFLOXACIN >=8 RESISTANT Resistant     ERYTHROMYCIN >=8 RESISTANT Resistant     GENTAMICIN <=0.5 SENSITIVE Sensitive     OXACILLIN >=4 RESISTANT Resistant     TETRACYCLINE <=1 SENSITIVE Sensitive     VANCOMYCIN  1 SENSITIVE Sensitive     TRIMETH/SULFA >=320 RESISTANT Resistant     CLINDAMYCIN <=0.25 SENSITIVE Sensitive     RIFAMPIN <=0.5 SENSITIVE Sensitive     Inducible Clindamycin NEGATIVE Sensitive     LINEZOLID  2 SENSITIVE Sensitive     * MODERATE METHICILLIN RESISTANT STAPHYLOCOCCUS AUREUS         Radiology Studies: No results found.      Scheduled Meds:  atorvastatin   20 mg Per Tube Daily   Chlorhexidine  Gluconate Cloth  6 each Topical Q2200   clonazepam   0.5 mg Oral BID   feeding supplement  237 mL Oral TID BM   feeding supplement (PROSource TF20)   60 mL Per Tube Daily   gabapentin   200 mg Oral TID   heparin  injection (subcutaneous)  5,000 Units Subcutaneous Q8H   insulin  aspart  0-9 Units Subcutaneous Q4H   levothyroxine   100 mcg Per Tube QAC breakfast   midodrine   5 mg Oral TID WC   nicotine   14 mg Transdermal Daily   Followed by   NOREEN ON 02/07/2023] nicotine   7 mg Transdermal Daily   pantoprazole  (PROTONIX ) IV  40 mg Intravenous QHS   sodium chloride  flush  3-10 mL Intravenous Q12H   Continuous Infusions:  sodium chloride  Stopped (01/27/23 0528)   feeding supplement (VITAL 1.5 CAL) Stopped (01/31/23 1500)          Sophie Mao, MD [facility] 02/02/2023, 8:43 AM

## 2023-02-03 DIAGNOSIS — A419 Sepsis, unspecified organism: Secondary | ICD-10-CM | POA: Diagnosis not present

## 2023-02-03 DIAGNOSIS — R6521 Severe sepsis with septic shock: Secondary | ICD-10-CM | POA: Diagnosis not present

## 2023-02-03 LAB — GLUCOSE, CAPILLARY
Glucose-Capillary: 101 mg/dL — ABNORMAL HIGH (ref 70–99)
Glucose-Capillary: 102 mg/dL — ABNORMAL HIGH (ref 70–99)
Glucose-Capillary: 117 mg/dL — ABNORMAL HIGH (ref 70–99)
Glucose-Capillary: 124 mg/dL — ABNORMAL HIGH (ref 70–99)
Glucose-Capillary: 78 mg/dL (ref 70–99)
Glucose-Capillary: 87 mg/dL (ref 70–99)
Glucose-Capillary: 97 mg/dL (ref 70–99)

## 2023-02-03 LAB — BASIC METABOLIC PANEL
Anion gap: 9 (ref 5–15)
BUN: 12 mg/dL (ref 6–20)
CO2: 35 mmol/L — ABNORMAL HIGH (ref 22–32)
Calcium: 8.7 mg/dL — ABNORMAL LOW (ref 8.9–10.3)
Chloride: 94 mmol/L — ABNORMAL LOW (ref 98–111)
Creatinine, Ser: 0.71 mg/dL (ref 0.44–1.00)
GFR, Estimated: >60 mL/min (ref >60)
Glucose, Bld: 90 mg/dL (ref 70–99)
Potassium: 4 mmol/L (ref 3.5–5.1)
Sodium: 138 mmol/L (ref 135–145)

## 2023-02-03 LAB — CBC
HCT: 29.6 % — ABNORMAL LOW (ref 36.0–46.0)
Hemoglobin: 8.7 g/dL — ABNORMAL LOW (ref 12.0–15.0)
MCH: 28.6 pg (ref 26.0–34.0)
MCHC: 29.4 g/dL — ABNORMAL LOW (ref 30.0–36.0)
MCV: 97.4 fL (ref 80.0–100.0)
Platelets: 394 10*3/uL (ref 150–400)
RBC: 3.04 MIL/uL — ABNORMAL LOW (ref 3.87–5.11)
RDW: 13.6 % (ref 11.5–15.5)
WBC: 10.9 10*3/uL — ABNORMAL HIGH (ref 4.0–10.5)
nRBC: 0 % (ref 0.0–0.2)

## 2023-02-03 LAB — MAGNESIUM: Magnesium: 2.4 mg/dL (ref 1.7–2.4)

## 2023-02-03 MED ORDER — ACETAMINOPHEN 500 MG PO TABS: 500.0000 mg | ORAL_TABLET | Freq: Four times a day (QID) | ORAL | Status: DC | PRN

## 2023-02-03 MED ORDER — ATORVASTATIN CALCIUM 20 MG PO TABS
20.0000 mg | ORAL_TABLET | Freq: Every day | ORAL | Status: DC
  Administered 2023-02-04 – 2023-02-05 (×2): 20 mg via ORAL
  Filled 2023-02-03 (×2): qty 1

## 2023-02-03 MED ORDER — LOPERAMIDE HCL 2 MG PO CAPS
2.0000 mg | ORAL_CAPSULE | ORAL | Status: DC | PRN
  Administered 2023-02-03: 2 mg via ORAL
  Filled 2023-02-03: qty 1

## 2023-02-03 MED ORDER — LEVOTHYROXINE SODIUM 100 MCG PO TABS
100.0000 ug | ORAL_TABLET | Freq: Every day | ORAL | Status: DC
  Administered 2023-02-04 – 2023-02-05 (×2): 100 ug via ORAL
  Filled 2023-02-03 (×2): qty 1

## 2023-02-03 MED ORDER — BOOST / RESOURCE BREEZE PO LIQD CUSTOM: 1.0000 | Freq: Three times a day (TID) | ORAL | Status: DC

## 2023-02-03 MED ORDER — TRAZODONE HCL 100 MG PO TABS
100.0000 mg | ORAL_TABLET | Freq: Every evening | ORAL | Status: DC | PRN
  Administered 2023-02-03 – 2023-02-04 (×2): 100 mg via ORAL
  Filled 2023-02-03: qty 1
  Filled 2023-02-03: qty 2

## 2023-02-03 NOTE — Progress Notes (Signed)
 PROGRESS NOTE    Sabrina Roberson  FMW:[id number] DOB: 25-Sep-1970 DOA: 01/19/2023 PCP: Knute Thersia Bitters, FNP   Brief Narrative:  This 53 year old female with history of mild asthma, diabetes mellitus type 2, hypertension, dyslipidemia, hypothyroidism, GERD, tobacco smoking presented with worsening shortness of breath and cough. She was found to be very hypoxic on presentation and was intubated and admitted to ICU. She was found to have influenza A pneumonia with ARDS along with septic shock and started on broad-spectrum antibiotics and Tamiflu . Subsequently, tracheal aspirate was positive for MRSA: Antibiotic switched to linezolid . She was extubated on 01/23/2023 but was reintubated on 01/25/2023 for increasing respiratory distress/anxiety. She also received few days of IV diuretics. She was extubated again on 01/29/2023. She has been weaned off pressors. She was transferred to [facility] service from 02/01/2023 onwards   Assessment & Plan:   Principal Problem:   Septic shock (HCC) Active Problems:   ARDS (adult respiratory distress syndrome) (HCC)   Acute hypoxic respiratory failure, Multifactorial: ARDS, Influenza A pneumonia, MRSA pneumonia Past history of mild asthma -She has required intubation twice during this hospitalization and was finally extubated again on 01/29/2023.   -Care transferred to TRH service from 02/01/2023 onwards and pulmonary has signed off. -2D echo had shown LVEF of 50 to 55%. -Patient received a few days of IV diuretics.  Currently not on any diuretics. -Completed Tamiflu  on 01/24/2023.  DC'd Zyvox  on 02/01/2023 after 7 days of treatment. -Currently on 2 L oxygen via nasal cannula.  Wean off as able.     Septic shock: POA; -Completed antibiotics course.  Blood pressure still on the lower side but stable.  Continue midodrine . -Shock has resolved.   Leukocytosis -Mild.  Improving, Continue to monitor.   Anemia of chronic disease -From chronic illnesses.  Hemoglobin  currently stable.  Monitor intermittently   Hyponatremia / Hypokalemia: -Resolved    Diabetes mellitus type 2: -Continue SSI   Anxiety Disorder: -She takes Valium  at home.  Currently on low-dose Klonopin .   Physical deconditioning: -PT recommending home health PT   Poor oral intake -Encouraged oral intake.  Still has ongoing tube feeding.  Dietitian following.  Diet as per SLP recommendations. -Oral intake improving.  She continues to improve.  Will remove Cortak tube today.   Tobacco abuse -Patient is an active smoker prior to presentation.  Counseled regarding cessation by [facility] provider.  Currently on nicotine  patch   GERD -Continue Protonix .   Hyperlipidemia -Continue statin.   DVT prophylaxis:Heparin  Code Status: Full code Family Communication: Husband at bed side. Disposition Plan:   Status is: Inpatient Remains inpatient appropriate because: of severity of illness. Anticipate discharge in 48 hours if tube gets removed today and oral intake keeps improving.    Consultants:  PCCM  Procedures:  Antimicrobials: Anti-infectives (From admission, onward)    Start     Dose/Rate Route Frequency Ordered Stop   01/26/23 1115  linezolid  (ZYVOX ) IVPB 600 mg        600 mg 300 mL/hr over 60 Minutes Intravenous Every 12 hours 01/26/23 1020 02/01/23 2238   01/24/23 1000  oseltamivir  (TAMIFLU ) capsule 75 mg        75 mg Oral 2 times daily 01/24/23 0807 01/24/23 2159   01/21/23 2200  vancomycin  (VANCOCIN ) 750 mg in sodium chloride  0.9 % 250 mL IVPB  Status:  Discontinued        750 mg 250 mL/hr over 60 Minutes Intravenous Every 12 hours 01/21/23 1939 01/26/23 1020   01/21/23  1000  vancomycin  (VANCOREADY) IVPB 750 mg/150 mL  Status:  Discontinued        750 mg 150 mL/hr over 60 Minutes Intravenous Every 12 hours 01/21/23 0833 01/21/23 0835   01/21/23 1000  vancomycin  (VANCOCIN ) 750 mg in sodium chloride  0.9 % 250 mL IVPB  Status:  Discontinued        750 mg 265 mL/hr over  60 Minutes Intravenous Every 12 hours 01/21/23 0835 01/21/23 1939   01/20/23 1800  vancomycin  (VANCOCIN ) IVPB 1000 mg/200 mL premix  Status:  Discontinued        1,000 mg 200 mL/hr over 60 Minutes Intravenous Every 24 hours 01/19/23 2359 01/21/23 0833   01/20/23 1230  azithromycin  (ZITHROMAX ) 500 mg in sodium chloride  0.9 % 250 mL IVPB  Status:  Discontinued        500 mg 250 mL/hr over 60 Minutes Intravenous Daily 01/20/23 1150 01/22/23 0824   01/20/23 0600  vancomycin  (VANCOREADY) IVPB 750 mg/150 mL  Status:  Discontinued        750 mg 150 mL/hr over 60 Minutes Intravenous Every 12 hours 01/19/23 2334 01/19/23 2346   01/20/23 0200  cefTRIAXone  (ROCEPHIN ) 1 g in sodium chloride  0.9 % 100 mL IVPB  Status:  Discontinued        1 g 200 mL/hr over 30 Minutes Intravenous Every 24 hours 01/19/23 2334 01/19/23 2345   01/20/23 0200  metroNIDAZOLE  (FLAGYL ) IVPB 500 mg  Status:  Discontinued        500 mg 100 mL/hr over 60 Minutes Intravenous Every 12 hours 01/19/23 2334 01/21/23 0742   01/20/23 0200  cefTRIAXone  (ROCEPHIN ) 2 g in sodium chloride  0.9 % 100 mL IVPB  Status:  Discontinued        2 g 200 mL/hr over 30 Minutes Intravenous Every 24 hours 01/19/23 2346 01/22/23 0942   01/19/23 2200  oseltamivir  (TAMIFLU ) capsule 75 mg  Status:  Discontinued        75 mg Per Tube 2 times daily 01/19/23 1952 01/24/23 0807   01/19/23 1845  vancomycin  (VANCOCIN ) 500 mg in sodium chloride  0.9 % 100 mL IVPB  Status:  Discontinued       Placed in Followed by Linked Group   500 mg 100 mL/hr over 60 Minutes Intravenous  Once 01/19/23 1730 01/19/23 2042   01/19/23 1730  vancomycin  (VANCOCIN ) IVPB 1000 mg/200 mL premix       Placed in Followed by Linked Group   1,000 mg 200 mL/hr over 60 Minutes Intravenous  Once 01/19/23 1730 01/19/23 2153   01/19/23 1730  vancomycin  (VANCOREADY) IVPB 1750 mg/350 mL  Status:  Discontinued        1,750 mg 175 mL/hr over 120 Minutes Intravenous  Once 01/19/23 1728 01/19/23  1730   01/19/23 1730  piperacillin -tazobactam (ZOSYN ) IVPB 3.375 g        3.375 g 12.5 mL/hr over 240 Minutes Intravenous  Once 01/19/23 1728 01/19/23 1910   01/19/23 1729  vancomycin  (VANCOCIN ) 1000 MG powder       Note to Pharmacy: Jonne Slough: cabinet override      01/19/23 1729 01/19/23 1805   01/19/23 1729  vancomycin  (VANCOCIN ) 750 MG injection       Note to Pharmacy: Jonne Slough: cabinet override      01/19/23 1729 01/20/23 0544      Subjective: Patient was seen and examined at bedside.  Overnight events noted.   Patient reports she is doing better. She reports has eaten her breakfast  and her oral intake is improving.  Objective: Vitals:   02/03/23 0400 02/03/23 0500 02/03/23 0600 02/03/23 0800  BP: (!) 100/58  (!) 103/56   Pulse: 80 72 75   Resp: (!) 25 19 (!) 26   Temp:    98.4 F (36.9 C)  TempSrc:    Oral  SpO2: 96% 97% 98%   Weight:  65.1 kg    Height:        Intake/Output Summary (Last 24 hours) at 02/03/2023 1117 Last data filed at 02/03/2023 1051 Gross per 24 hour  Intake --  Output 3 ml  Net -3 ml   Filed Weights   02/01/23 0500 02/02/23 0500 02/03/23 0500  Weight: 61.5 kg 61.8 kg 65.1 kg    Examination:  General exam: Appears calm and comfortable, deconditioned, not in any acute distress. Respiratory system: CTA Bilaterally. Respiratory effort normal.  RR 15 Cardiovascular system: S1 & S2 heard, RRR. No JVD, murmurs, rubs, gallops or clicks. No pedal edema. Gastrointestinal system: Abdomen is nondistended, soft and nontender. Normal bowel sounds heard. Central nervous system: Alert and oriented x 3. No focal neurological deficits. Extremities: No edema, no cyanosis, no clubbing. Skin: No rashes, lesions or ulcers Psychiatry: Judgement and insight appear normal. Mood & affect appropriate.     Data Reviewed: I have personally reviewed following labs and imaging studies  CBC: Recent Labs  Lab 01/30/23 0413 01/31/23 0534 02/01/23 0444  02/02/23 0344 02/03/23 0306  WBC 16.5* 11.1* 10.7* 11.1* 10.9*  HGB 9.2* 8.8* 8.4* 8.3* 8.7*  HCT 29.3* 27.7* 28.1* 26.6* 29.6*  MCV 91.8 93.0 94.3 95.0 97.4  PLT 237 228 279 315 394   Basic Metabolic Panel: Recent Labs  Lab 01/30/23 0413 01/31/23 0534 02/01/23 0444 02/02/23 0344 02/03/23 0306  NA 131* 133* 134* 136 138  K 4.1 3.6 3.8 3.8 4.0  CL 93* 93* 92* 94* 94*  CO2 31 35* 36* 34* 35*  GLUCOSE 111* 104* 85 81 90  BUN 11 9 9 12 12   CREATININE 0.66 0.47 0.65 0.46 0.71  CALCIUM  7.0* 8.1* 8.2* 8.4* 8.7*  MG 1.8 2.3 2.4 2.4 2.4   GFR: Estimated Creatinine Clearance: 72.6 mL/min (by C-G formula based on SCr of 0.71 mg/dL). Liver Function Tests: No results for input(s): AST, ALT, ALKPHOS, BILITOT, PROT, ALBUMIN in the last 168 hours. No results for input(s): LIPASE, AMYLASE in the last 168 hours. No results for input(s): AMMONIA in the last 168 hours. Coagulation Profile: No results for input(s): INR, PROTIME in the last 168 hours. Cardiac Enzymes: No results for input(s): CKTOTAL, CKMB, CKMBINDEX, TROPONINI in the last 168 hours. BNP (last 3 results) No results for input(s): PROBNP in the last 8760 hours. HbA1C: No results for input(s): HGBA1C in the last 72 hours. CBG: Recent Labs  Lab 02/02/23 1522 02/02/23 1950 02/03/23 0005 02/03/23 0307 02/03/23 0741  GLUCAP 88 87 124* 97 87   Lipid Profile: No results for input(s): CHOL, HDL, LDLCALC, TRIG, CHOLHDL, LDLDIRECT in the last 72 hours. Thyroid  Function Tests: No results for input(s): TSH, T4TOTAL, FREET4, T3FREE, THYROIDAB in the last 72 hours. Anemia Panel: No results for input(s): VITAMINB12, FOLATE, FERRITIN, TIBC, IRON, RETICCTPCT in the last 72 hours. Sepsis Labs: No results for input(s): PROCALCITON, LATICACIDVEN in the last 168 hours.  Recent Results (from the past 240 hours)  Culture, Respiratory w Gram Stain     Status: None    Collection Time: 01/26/23  7:46 AM   Specimen: Tracheal Aspirate; Respiratory  Result Value Ref Range Status   Specimen Description   Final    TRACHEAL ASPIRATE Performed at [facility], [address] [place], [place] [address]    Special Requests   Final    NONE Performed at [facility], [address], [place], [place] [address]    Gram Stain   Final    FEW WBC SEEN FEW SQUAMOUS EPITHELIAL CELLS PRESENT FEW GRAM POSITIVE COCCI Performed at [facility], [address], [place], [place] [address]    Culture   Final    MODERATE METHICILLIN RESISTANT STAPHYLOCOCCUS AUREUS   Report Status 01/28/2023 FINAL  Final   Organism ID, Bacteria METHICILLIN RESISTANT STAPHYLOCOCCUS AUREUS  Final      Susceptibility   Methicillin resistant staphylococcus aureus - MIC*    CIPROFLOXACIN >=8 RESISTANT Resistant     ERYTHROMYCIN >=8 RESISTANT Resistant     GENTAMICIN <=0.5 SENSITIVE Sensitive     OXACILLIN >=4 RESISTANT Resistant     TETRACYCLINE <=1 SENSITIVE Sensitive     VANCOMYCIN  1 SENSITIVE Sensitive     TRIMETH/SULFA >=320 RESISTANT Resistant     CLINDAMYCIN <=0.25 SENSITIVE Sensitive     RIFAMPIN <=0.5 SENSITIVE Sensitive     Inducible Clindamycin NEGATIVE Sensitive     LINEZOLID  2 SENSITIVE Sensitive     * MODERATE METHICILLIN RESISTANT STAPHYLOCOCCUS AUREUS    Radiology Studies: No results found.  Scheduled Meds:  atorvastatin   20 mg Per Tube Daily   Chlorhexidine  Gluconate Cloth  6 each Topical Q2200   clonazepam   0.5 mg Oral BID   feeding supplement  1 Container Oral TID BM   feeding supplement (PROSource TF20)  60 mL Per Tube Daily   gabapentin   200 mg Oral TID   heparin  injection (subcutaneous)  5,000 Units Subcutaneous Q8H   insulin  aspart  0-9 Units Subcutaneous Q4H   levothyroxine   100 mcg Per Tube QAC breakfast   midodrine   5 mg Oral TID WC   nicotine   14 mg Transdermal Daily   Followed by   NOREEN ON 02/07/2023]  nicotine   7 mg Transdermal Daily   sodium chloride  flush  3-10 mL Intravenous Q12H   Continuous Infusions:  sodium chloride  Stopped (01/27/23 0528)   feeding supplement (VITAL 1.5 CAL) Stopped (01/31/23 1500)     LOS: 15 days    Time spent: 50 mins    Darcel Dawley, MD [facility]   If 7PM-7AM, please contact night-coverage

## 2023-02-04 DIAGNOSIS — A419 Sepsis, unspecified organism: Secondary | ICD-10-CM | POA: Diagnosis not present

## 2023-02-04 DIAGNOSIS — R6521 Severe sepsis with septic shock: Secondary | ICD-10-CM | POA: Diagnosis not present

## 2023-02-04 LAB — GLUCOSE, CAPILLARY
Glucose-Capillary: 70 mg/dL (ref 70–99)
Glucose-Capillary: 83 mg/dL (ref 70–99)
Glucose-Capillary: 95 mg/dL (ref 70–99)
Glucose-Capillary: 128 mg/dL — ABNORMAL HIGH (ref 70–99)
Glucose-Capillary: 84 mg/dL (ref 70–99)

## 2023-02-04 MED ORDER — KATE FARMS STANDARD 1.4 PO LIQD
325.0000 mL | Freq: Two times a day (BID) | ORAL | Status: DC
  Administered 2023-02-05: 325 mL via ORAL
  Filled 2023-02-04 (×2): qty 325

## 2023-02-04 MED ORDER — PANTOPRAZOLE SODIUM 40 MG PO TBEC
40.0000 mg | DELAYED_RELEASE_TABLET | Freq: Every day | ORAL | Status: DC
  Administered 2023-02-04 – 2023-02-05 (×2): 40 mg via ORAL
  Filled 2023-02-04 (×2): qty 1

## 2023-02-04 MED ORDER — DIAZEPAM 5 MG PO TABS
5.0000 mg | ORAL_TABLET | Freq: Two times a day (BID) | ORAL | Status: DC | PRN
  Administered 2023-02-04 (×2): 5 mg via ORAL
  Filled 2023-02-04 (×2): qty 1

## 2023-02-04 MED ORDER — INSULIN ASPART 100 UNIT/ML IJ SOLN: 0.0000 [IU] | Freq: Three times a day (TID) | INTRAMUSCULAR | Status: DC

## 2023-02-04 MED ORDER — INSULIN ASPART 100 UNIT/ML IJ SOLN
0.0000 [IU] | Freq: Every day | INTRAMUSCULAR | Status: DC
Start: 2023-02-05 — End: 2023-02-05

## 2023-02-04 NOTE — Progress Notes (Signed)
 PROGRESS NOTE    Sabrina Roberson  FMW:[id number] DOB: [birth year] DOA: 01/19/2023 PCP: Knute Thersia Bitters, FNP   Brief Narrative:  This 53 year old female with history of mild asthma, diabetes mellitus type 2, hypertension, dyslipidemia, hypothyroidism, GERD, tobacco smoking presented with worsening shortness of breath and cough. She was found to be very hypoxic on presentation and was intubated and admitted to ICU. She was found to have influenza A pneumonia with ARDS along with septic shock and started on broad-spectrum antibiotics and Tamiflu . Subsequently, tracheal aspirate was positive for MRSA: Antibiotic switched to linezolid . She was extubated on 01/23/2023 but was reintubated on 01/25/2023 for increasing respiratory distress/anxiety. She also received few days of IV diuretics. She was extubated again on 01/29/2023. She has been weaned off pressors. She was transferred to TRH service from 02/01/2023 onwards   Assessment & Plan:   Principal Problem:   Septic shock (HCC) Active Problems:   ARDS (adult respiratory distress syndrome) (HCC)   Acute hypoxic respiratory failure, Multifactorial: ARDS, Influenza A pneumonia, MRSA pneumonia Past history of mild asthma -She has required intubation twice during this hospitalization and was finally extubated again on 01/29/2023.   -Care transferred to TRH service from 02/01/2023 onwards and pulmonary has signed off. -2D echo had shown LVEF of 50 to 55%. -Patient received a few days of IV diuretics.  Currently not on any diuretics. -Completed Tamiflu  on 01/24/2023.  DC'd Zyvox  on 02/01/2023 after 7 days of treatment. -Currently on 2 L oxygen via nasal cannula.  Wean off as able.   -Check O2 while ambulation.   Septic shock: POA; -Completed antibiotics course.  Blood pressure still on the lower side but stable.   -Continue midodrine . -Shock has resolved.   Leukocytosis: -Mild.  Improving, Continue to monitor.   Anemia of chronic disease -From  chronic illnesses.  Hemoglobin currently stable.  Monitor intermittently   Hyponatremia / Hypokalemia: -Resolved.   Diabetes mellitus type 2: -Continue SSI.   Anxiety Disorder: -Continue Valium  5 mg every 12 hours as needed for anxiety   Physical deconditioning: -PT recommending home health PT   Poor oral intake: -Encouraged oral intake.  Dietitian following.  Diet as per SLP recommendations. -Oral intake improving.  Cortrek removed yesterday.  She has been tolerating diet.   Tobacco abuse: -Patient is an active smoker prior to presentation.   -Counseled regarding cessation by [facility] provider.  Currently on nicotine  patch   GERD: -Continue Protonix .   Hyperlipidemia: -Continue statin.   DVT prophylaxis:Heparin  Code Status: Full code Family Communication: Husband at bed side. Disposition Plan:   Status is: Inpatient Remains inpatient appropriate because: of severity of illness. Anticipated discharge in 24 hours if she tolerates diet well, check oxygen while ambulation.   Consultants:  PCCM  Procedures:  Antimicrobials: Anti-infectives (From admission, onward)    Start     Dose/Rate Route Frequency Ordered Stop   01/26/23 1115  linezolid  (ZYVOX ) IVPB 600 mg        600 mg 300 mL/hr over 60 Minutes Intravenous Every 12 hours 01/26/23 1020 02/01/23 2238   01/24/23 1000  oseltamivir  (TAMIFLU ) capsule 75 mg        75 mg Oral 2 times daily 01/24/23 0807 01/24/23 2159   01/21/23 2200  vancomycin  (VANCOCIN ) 750 mg in sodium chloride  0.9 % 250 mL IVPB  Status:  Discontinued        750 mg 250 mL/hr over 60 Minutes Intravenous Every 12 hours 01/21/23 1939 01/26/23 1020   01/21/23 1000  vancomycin  (  VANCOREADY) IVPB 750 mg/150 mL  Status:  Discontinued        750 mg 150 mL/hr over 60 Minutes Intravenous Every 12 hours 01/21/23 0833 01/21/23 0835   01/21/23 1000  vancomycin  (VANCOCIN ) 750 mg in sodium chloride  0.9 % 250 mL IVPB  Status:  Discontinued        750 mg 265 mL/hr  over 60 Minutes Intravenous Every 12 hours 01/21/23 0835 01/21/23 1939   01/20/23 1800  vancomycin  (VANCOCIN ) IVPB 1000 mg/200 mL premix  Status:  Discontinued        1,000 mg 200 mL/hr over 60 Minutes Intravenous Every 24 hours 01/19/23 2359 01/21/23 0833   01/20/23 1230  azithromycin  (ZITHROMAX ) 500 mg in sodium chloride  0.9 % 250 mL IVPB  Status:  Discontinued        500 mg 250 mL/hr over 60 Minutes Intravenous Daily 01/20/23 1150 01/22/23 0824   01/20/23 0600  vancomycin  (VANCOREADY) IVPB 750 mg/150 mL  Status:  Discontinued        750 mg 150 mL/hr over 60 Minutes Intravenous Every 12 hours 01/19/23 2334 01/19/23 2346   01/20/23 0200  cefTRIAXone  (ROCEPHIN ) 1 g in sodium chloride  0.9 % 100 mL IVPB  Status:  Discontinued        1 g 200 mL/hr over 30 Minutes Intravenous Every 24 hours 01/19/23 2334 01/19/23 2345   01/20/23 0200  metroNIDAZOLE  (FLAGYL ) IVPB 500 mg  Status:  Discontinued        500 mg 100 mL/hr over 60 Minutes Intravenous Every 12 hours 01/19/23 2334 01/21/23 0742   01/20/23 0200  cefTRIAXone  (ROCEPHIN ) 2 g in sodium chloride  0.9 % 100 mL IVPB  Status:  Discontinued        2 g 200 mL/hr over 30 Minutes Intravenous Every 24 hours 01/19/23 2346 01/22/23 0942   01/19/23 2200  oseltamivir  (TAMIFLU ) capsule 75 mg  Status:  Discontinued        75 mg Per Tube 2 times daily 01/19/23 1952 01/24/23 0807   01/19/23 1845  vancomycin  (VANCOCIN ) 500 mg in sodium chloride  0.9 % 100 mL IVPB  Status:  Discontinued       Placed in Followed by Linked Group   500 mg 100 mL/hr over 60 Minutes Intravenous  Once 01/19/23 1730 01/19/23 2042   01/19/23 1730  vancomycin  (VANCOCIN ) IVPB 1000 mg/200 mL premix       Placed in Followed by Linked Group   1,000 mg 200 mL/hr over 60 Minutes Intravenous  Once 01/19/23 1730 01/19/23 2153   01/19/23 1730  vancomycin  (VANCOREADY) IVPB 1750 mg/350 mL  Status:  Discontinued        1,750 mg 175 mL/hr over 120 Minutes Intravenous  Once 01/19/23 1728  01/19/23 1730   01/19/23 1730  piperacillin -tazobactam (ZOSYN ) IVPB 3.375 g        3.375 g 12.5 mL/hr over 240 Minutes Intravenous  Once 01/19/23 1728 01/19/23 1910   01/19/23 1729  vancomycin  (VANCOCIN ) 1000 MG powder       Note to Pharmacy: Jonne Slough: cabinet override      01/19/23 1729 01/19/23 1805   01/19/23 1729  vancomycin  (VANCOCIN ) 750 MG injection       Note to Pharmacy: Jonne Slough: cabinet override      01/19/23 1729 01/20/23 0544      Subjective: Patient was seen and examined at bedside.  Overnight events noted.   Patient reports doing much better, She has tolerated her breakfast. Cortrek tube was removed yesterday.  Objective: Vitals:   02/04/23 0754 02/04/23 0800 02/04/23 1015 02/04/23 1119  BP:  (!) 93/46 (!) 110/56   Pulse:  79 77   Resp:  20 (!) 24   Temp: 98.5 F (36.9 C)   99.2 F (37.3 C)  TempSrc: Oral   Oral  SpO2:  96% 94%   Weight:      Height:        Intake/Output Summary (Last 24 hours) at 02/04/2023 1127 Last data filed at 02/04/2023 1016 Gross per 24 hour  Intake 20 ml  Output --  Net 20 ml   Filed Weights   02/02/23 0500 02/03/23 0500 02/04/23 0411  Weight: 61.8 kg 65.1 kg 64.1 kg    Examination:  General exam: Appears comfortable, deconditioned, not in any acute distress. Respiratory system: CTA Bilaterally. Respiratory effort normal.  RR 14 Cardiovascular system: S1 & S2 heard, RRR. No JVD, murmurs, rubs, gallops or clicks. No pedal edema. Gastrointestinal system: Abdomen is non distended, soft and non tender. Normal bowel sounds heard. Central nervous system: Alert and oriented x 3. No focal neurological deficits. Extremities: No edema, no cyanosis, no clubbing. Skin: No rashes, lesions or ulcers Psychiatry: Judgement and insight appear normal. Mood & affect appropriate.     Data Reviewed: I have personally reviewed following labs and imaging studies  CBC: Recent Labs  Lab 01/30/23 0413 01/31/23 0534 02/01/23 0444  02/02/23 0344 02/03/23 0306  WBC 16.5* 11.1* 10.7* 11.1* 10.9*  HGB 9.2* 8.8* 8.4* 8.3* 8.7*  HCT 29.3* 27.7* 28.1* 26.6* 29.6*  MCV 91.8 93.0 94.3 95.0 97.4  PLT 237 228 279 315 394   Basic Metabolic Panel: Recent Labs  Lab 01/30/23 0413 01/31/23 0534 02/01/23 0444 02/02/23 0344 02/03/23 0306  NA 131* 133* 134* 136 138  K 4.1 3.6 3.8 3.8 4.0  CL 93* 93* 92* 94* 94*  CO2 31 35* 36* 34* 35*  GLUCOSE 111* 104* 85 81 90  BUN 11 9 9 12 12   CREATININE 0.66 0.47 0.65 0.46 0.71  CALCIUM  7.0* 8.1* 8.2* 8.4* 8.7*  MG 1.8 2.3 2.4 2.4 2.4   GFR: Estimated Creatinine Clearance: 72.6 mL/min (by C-G formula based on SCr of 0.71 mg/dL). Liver Function Tests: No results for input(s): AST, ALT, ALKPHOS, BILITOT, PROT, ALBUMIN in the last 168 hours. No results for input(s): LIPASE, AMYLASE in the last 168 hours. No results for input(s): AMMONIA in the last 168 hours. Coagulation Profile: No results for input(s): INR, PROTIME in the last 168 hours. Cardiac Enzymes: No results for input(s): CKTOTAL, CKMB, CKMBINDEX, TROPONINI in the last 168 hours. BNP (last 3 results) No results for input(s): PROBNP in the last 8760 hours. HbA1C: No results for input(s): HGBA1C in the last 72 hours. CBG: Recent Labs  Lab 02/03/23 2005 02/03/23 2325 02/04/23 0338 02/04/23 0752 02/04/23 1114  GLUCAP 101* 78 70 83 95   Lipid Profile: No results for input(s): CHOL, HDL, LDLCALC, TRIG, CHOLHDL, LDLDIRECT in the last 72 hours. Thyroid  Function Tests: No results for input(s): TSH, T4TOTAL, FREET4, T3FREE, THYROIDAB in the last 72 hours. Anemia Panel: No results for input(s): VITAMINB12, FOLATE, FERRITIN, TIBC, IRON, RETICCTPCT in the last 72 hours. Sepsis Labs: No results for input(s): PROCALCITON, LATICACIDVEN in the last 168 hours.  Recent Results (from the past 240 hours)  Culture, Respiratory w Gram Stain     Status: None    Collection Time: 01/26/23  7:46 AM   Specimen: Tracheal Aspirate; Respiratory  Result Value Ref Range Status  Specimen Description   Final    TRACHEAL ASPIRATE Performed at [facility], [address] [place], [place] [address]    Special Requests   Final    NONE Performed at [facility], [address], [place], [place] [address]    Gram Stain   Final    FEW WBC SEEN FEW SQUAMOUS EPITHELIAL CELLS PRESENT FEW GRAM POSITIVE COCCI Performed at [facility], [address], [place], [place] [address]    Culture   Final    MODERATE METHICILLIN RESISTANT STAPHYLOCOCCUS AUREUS   Report Status 01/28/2023 FINAL  Final   Organism ID, Bacteria METHICILLIN RESISTANT STAPHYLOCOCCUS AUREUS  Final      Susceptibility   Methicillin resistant staphylococcus aureus - MIC*    CIPROFLOXACIN >=8 RESISTANT Resistant     ERYTHROMYCIN >=8 RESISTANT Resistant     GENTAMICIN <=0.5 SENSITIVE Sensitive     OXACILLIN >=4 RESISTANT Resistant     TETRACYCLINE <=1 SENSITIVE Sensitive     VANCOMYCIN  1 SENSITIVE Sensitive     TRIMETH/SULFA >=320 RESISTANT Resistant     CLINDAMYCIN <=0.25 SENSITIVE Sensitive     RIFAMPIN <=0.5 SENSITIVE Sensitive     Inducible Clindamycin NEGATIVE Sensitive     LINEZOLID  2 SENSITIVE Sensitive     * MODERATE METHICILLIN RESISTANT STAPHYLOCOCCUS AUREUS    Radiology Studies: No results found.  Scheduled Meds:  atorvastatin   20 mg Oral Daily   Chlorhexidine  Gluconate Cloth  6 each Topical Q2200   feeding supplement  1 Container Oral TID BM   gabapentin   200 mg Oral TID   heparin  injection (subcutaneous)  5,000 Units Subcutaneous Q8H   insulin  aspart  0-9 Units Subcutaneous Q4H   levothyroxine   100 mcg Oral QAC breakfast   midodrine   5 mg Oral TID WC   nicotine   14 mg Transdermal Daily   Followed by   NOREEN ON 02/07/2023] nicotine   7 mg Transdermal Daily   sodium chloride  flush  3-10 mL Intravenous Q12H   Continuous  Infusions:  sodium chloride  Stopped (01/27/23 0528)     LOS: 16 days    Time spent: 35 mins    Darcel Dawley, MD [facility]   If 7PM-7AM, please contact night-coverage

## 2023-02-04 NOTE — Progress Notes (Addendum)
 SATURATION QUALIFICATIONS: (This note is used to comply with regulatory documentation for home oxygen)  Patient Saturations on Room Air at Rest = 88%  Patient Saturations on Room Air while Ambulating = 85%  Patient Saturations on 1 Liters of oxygen while Ambulating = 90%  Please briefly explain why patient needs home oxygen:requires supplemental oxygen for   home mobility such as ambulation. [name] PT Acute Rehabilitation Services Office [phone] Weekend [phone]

## 2023-02-04 NOTE — Progress Notes (Signed)
 Physical Therapy Treatment Patient Details Name: Sabrina Sabrina MRN: [id number] DOB: Dec 01, 1970 Today's Date: 02/04/2023   History of Present Illness A 53 y.o. female patient who presented to ED 01/19/23 with AMS, hypoxia,septic shock,  positive influenza A,required intubation x 2, extubated 01/29/23. EFY:tpuy mild asthma on Albuterol  PRN, DM-2, HTN, dyslipidemia, hypothyroidism, GERD, and tobacco smoking (1 ppd for 30 yrs)    PT Comments  Patient eager to ambulate. Resting VS; HR108, On 1 L 95%,  RR 30  Patient ambulated x 3 with RW, HR  increasing   to 140-150's while ambulating.  Distance 28' then 40' x 2. RR max 38.  Patient with noted tremors of  legs and  arms, reports normal .  Patient stated that she should be fine at home without HHPT. Encouraged that it will benefit her,   See saturation walk test. Currently requires  supplemental oxygen .     If plan is discharge home, recommend the following: A little help with walking and/or transfers;A little help with bathing/dressing/bathroom;Direct supervision/assist for medications management;Assistance with cooking/housework;Help with stairs or ramp for entrance;Assist for transportation   Can travel by private vehicle        Equipment Recommendations  None recommended by PT    Recommendations for Other Services       Precautions / Restrictions Precautions Precaution Comments: monitor HR and sats Restrictions Weight Bearing Restrictions Per Provider Order: No     Mobility  Bed Mobility               General bed mobility comments: in recliner    Transfers Overall transfer level: Needs assistance Equipment used: Rolling walker (2 wheels)   Sit to Stand: Contact guard assist           General transfer comment: extra effort  to self power up, no support except Rw.    Ambulation/Gait Ambulation/Gait assistance: Contact guard assist, Min assist Gait Distance (Feet): 80 Feet (then40 x 2) Assistive device:  Rolling walker (2 wheels) Gait Pattern/deviations: Step-through pattern, Staggering left, Staggering right Gait velocity: decr     General Gait Details: noted tremors of legs/feet, did need some support , mosre on  3 rd ambulation trila.   Stairs             Wheelchair Mobility     Tilt Bed    Modified Rankin (Stroke Patients Only)       Balance   Sitting-balance support: Bilateral upper extremity supported, Feet supported Sitting balance-Leahy Scale: Good       Standing balance-Leahy Scale: Fair Standing balance comment: reliant on RW                            Cognition Arousal: Alert Behavior During Therapy: WFL for tasks assessed/performed   Area of Impairment: Awareness, Safety/judgement                         Safety/Judgement: Decreased awareness of deficits     General Comments: oriented to date, states that she will be fine once she gets her lungs clear, denies need for  HHPT        Exercises      General Comments        Pertinent Vitals/Pain Pain Assessment Pain Assessment: No/denies pain    Home Living  Prior Function            PT Goals (current goals can now be found in the care plan section) Progress towards PT goals: Progressing toward goals    Frequency    Min 1X/week      PT Plan      Co-evaluation              AM-PAC PT 6 Clicks Mobility   Outcome Measure  Help needed turning from your back to your side while in a flat bed without using bedrails?: A Little Help needed moving from lying on your back to sitting on the side of a flat bed without using bedrails?: A Little Help needed moving to and from a bed to a chair (including a wheelchair)?: A Little Help needed standing up from a chair using your arms (e.g., wheelchair or bedside chair)?: A Little Help needed to walk in hospital room?: A Little Help needed climbing 3-5 steps with a railing? : A  Lot 6 Click Score: 17    End of Session Equipment Utilized During Treatment: Oxygen Activity Tolerance: Patient tolerated treatment well Patient left: in chair;with call bell/phone within reach;with chair alarm set;with family/visitor present Nurse Communication: Mobility status PT Visit Diagnosis: Unsteadiness on feet (R26.81);Difficulty in walking, not elsewhere classified (R26.2)     Time: 1430-1500 PT Time Calculation (min) (ACUTE ONLY): 30 min  Charges:    $Gait Training: 23-37 mins PT General Charges $$ ACUTE PT VISIT: 1 Visit                     Sabrina Sabrina PT Acute Rehabilitation Services Office [phone] Weekend [phone]    Sabrina Sabrina 02/04/2023, 3:08 PM

## 2023-02-04 NOTE — Progress Notes (Signed)
 Speech Language Pathology Treatment: Dysphagia  Patient Details Name: Sabrina Roberson MRN: [id number] DOB: 12-Sep-1970 Today's Date: 02/04/2023 Time: 8744-8690 SLP Time Calculation (min) (ACUTE ONLY): 14 min  Assessment / Plan / Recommendation Clinical Impression  Pt seen to assess po tolerance with dietary advancement.  Pt observed with her lunch including sandwich and soda. Swallow was timely and no indication of airway compromise.     Pt reports dental sensitivity to cold- - upper front teeth primarily - new per patient, causing SLP to question if due to biting on ET tubing.  SLP advised she consider using Sensodyne.    Pt's voice remains slightly hoarse - she reports ongoing cough which reportedly is not coorelated to po intake.     Pt states her swallowing ability is now at baseline.  She is on 1 Liter of oxygen and managing well.  Of note, she reports she had her esophagus stretched in November 2024 and was put on a PPI 20 mg - advised to take it perpetually. Sent secure chat to MD and RN regarding these concerns.    Pt has made excellent progress and her dysphagia has resolved.  No SLP follow up.     HPI HPI: Patient is a 53 year old female admitted to [place] from [facility] with cough x 2 days prior to admission and altered mental status.  Patient found to have influenza A, shock, ARDS, with increased oxygen needs and was intubated 12 22-12 26.  After extubation patient desatted was placed on BiPAP and salter.  Past medical history positive for gerd, mild asthma, smoker, fibromyalgia, takes care of grandchild, adm from [facility] with cough x2 days prior to admission, found to have influenza A, shock, ARDS, requiring incr oxygen, intubated 12/22 to 12/26- desat then on Salter.  She required reintubation 12/28-01/29/2023.  CXR worsening, other past medical history includes h/o tremors, gait issues, pain, tremor, nausea,  confusion, visual changes, hearing loss, difficulty walking,  numbness, weakness and possible Bell's palsy.  She had been worked up previously for potential multiple sclerosis s/p 2015 images, prior gastric emptying study - normal in the past.  CXR 01/30/2023 1. Stable support apparatus.  2. No change in bilateral interstitial and airspace opacities      SLP Plan  Continue with current plan of care      Recommendations for follow up therapy are one component of a multi-disciplinary discharge planning process, led by the attending physician.  Recommendations may be updated based on patient status, additional functional criteria and insurance authorization.    Recommendations  Diet recommendations: Regular;Thin liquid Liquids provided via: Cup;Straw Medication Administration: Whole meds with liquid Supervision: Patient able to self feed Compensations: Slow rate;Small sips/bites Postural Changes and/or Swallow Maneuvers: Seated upright 90 degrees;Upright 30-60 min after meal                  Oral care BID   None Dysphagia, oropharyngeal phase (R13.12)     Continue with current plan of care   Sabrina POUR, MS [facility] SLP Acute Rehab Services Office [phone]   Sabrina Roberson  02/04/2023, 3:50 PM

## 2023-02-04 NOTE — Plan of Care (Signed)
  Problem: Education: Goal: Knowledge of General Education information will improve Description: Including pain rating scale, medication(s)/side effects and non-pharmacologic comfort measures Outcome: Progressing   Problem: Health Behavior/Discharge Planning: Goal: Ability to manage health-related needs will improve Outcome: Progressing   Problem: Clinical Measurements: Goal: Ability to maintain clinical measurements within normal limits will improve Outcome: Progressing Goal: Will remain free from infection Outcome: Progressing Goal: Diagnostic test results will improve Outcome: Progressing Goal: Respiratory complications will improve Outcome: Progressing Goal: Cardiovascular complication will be avoided Outcome: Progressing   Problem: Activity: Goal: Risk for activity intolerance will decrease Outcome: Progressing   Problem: Nutrition: Goal: Adequate nutrition will be maintained Outcome: Progressing   Problem: Coping: Goal: Level of anxiety will decrease Outcome: Progressing   Problem: Elimination: Goal: Will not experience complications related to bowel motility Outcome: Progressing Goal: Will not experience complications related to urinary retention Outcome: Progressing   Problem: Pain Management: Goal: General experience of comfort will improve Outcome: Progressing   Problem: Safety: Goal: Ability to remain free from injury will improve Outcome: Progressing   Problem: Skin Integrity: Goal: Risk for impaired skin integrity will decrease Outcome: Progressing   Problem: Education: Goal: Ability to describe self-care measures that may prevent or decrease complications (Diabetes Survival Skills Education) will improve Outcome: Progressing Goal: Individualized Educational Video(s) Outcome: Progressing   Problem: Coping: Goal: Ability to adjust to condition or change in health will improve Outcome: Progressing   Problem: Fluid Volume: Goal: Ability to  maintain a balanced intake and output will improve Outcome: Progressing   Problem: Health Behavior/Discharge Planning: Goal: Ability to identify and utilize available resources and services will improve Outcome: Progressing Goal: Ability to manage health-related needs will improve Outcome: Progressing   Problem: Metabolic: Goal: Ability to maintain appropriate glucose levels will improve Outcome: Progressing   Problem: Nutritional: Goal: Maintenance of adequate nutrition will improve Outcome: Progressing Goal: Progress toward achieving an optimal weight will improve Outcome: Progressing   Problem: Skin Integrity: Goal: Risk for impaired skin integrity will decrease Outcome: Progressing   Problem: Tissue Perfusion: Goal: Adequacy of tissue perfusion will improve Outcome: Progressing   Problem: Activity: Goal: Ability to tolerate increased activity will improve Outcome: Progressing   Problem: Respiratory: Goal: Ability to maintain a clear airway and adequate ventilation will improve Outcome: Progressing   Problem: Role Relationship: Goal: Method of communication will improve Outcome: Progressing

## 2023-02-05 ENCOUNTER — Encounter (HOSPITAL_BASED_OUTPATIENT_CLINIC_OR_DEPARTMENT_OTHER): Payer: Self-pay

## 2023-02-05 ENCOUNTER — Telehealth (HOSPITAL_BASED_OUTPATIENT_CLINIC_OR_DEPARTMENT_OTHER): Payer: Self-pay | Admitting: Family Medicine

## 2023-02-05 DIAGNOSIS — A419 Sepsis, unspecified organism: Secondary | ICD-10-CM | POA: Diagnosis not present

## 2023-02-05 DIAGNOSIS — R6521 Severe sepsis with septic shock: Secondary | ICD-10-CM | POA: Diagnosis not present

## 2023-02-05 LAB — CBC
HCT: 26.5 % — ABNORMAL LOW (ref 36.0–46.0)
Hemoglobin: 8 g/dL — ABNORMAL LOW (ref 12.0–15.0)
MCH: 29.2 pg (ref 26.0–34.0)
MCHC: 30.2 g/dL (ref 30.0–36.0)
MCV: 96.7 fL (ref 80.0–100.0)
Platelets: 486 10*3/uL — ABNORMAL HIGH (ref 150–400)
RBC: 2.74 MIL/uL — ABNORMAL LOW (ref 3.87–5.11)
RDW: 14.2 % (ref 11.5–15.5)
WBC: 9.3 10*3/uL (ref 4.0–10.5)
nRBC: 0 % (ref 0.0–0.2)

## 2023-02-05 LAB — BASIC METABOLIC PANEL
Anion gap: 7 (ref 5–15)
BUN: 10 mg/dL (ref 6–20)
CO2: 32 mmol/L (ref 22–32)
Calcium: 8.2 mg/dL — ABNORMAL LOW (ref 8.9–10.3)
Chloride: 95 mmol/L — ABNORMAL LOW (ref 98–111)
Creatinine, Ser: 0.65 mg/dL (ref 0.44–1.00)
GFR, Estimated: >60 mL/min (ref >60)
Glucose, Bld: 89 mg/dL (ref 70–99)
Potassium: 3.1 mmol/L — ABNORMAL LOW (ref 3.5–5.1)
Sodium: 134 mmol/L — ABNORMAL LOW (ref 135–145)

## 2023-02-05 LAB — PHOSPHORUS: Phosphorus: 4.2 mg/dL (ref 2.5–4.6)

## 2023-02-05 LAB — GLUCOSE, CAPILLARY
Glucose-Capillary: 77 mg/dL (ref 70–99)
Glucose-Capillary: 80 mg/dL (ref 70–99)

## 2023-02-05 LAB — MAGNESIUM: Magnesium: 2.2 mg/dL (ref 1.7–2.4)

## 2023-02-05 MED ORDER — PANTOPRAZOLE SODIUM 40 MG PO TBEC: 40.0000 mg | DELAYED_RELEASE_TABLET | Freq: Every day | ORAL | 0 refills | Status: DC

## 2023-02-05 MED ORDER — POTASSIUM CHLORIDE 20 MEQ PO PACK
40.0000 meq | PACK | Freq: Once | ORAL | Status: AC
  Administered 2023-02-05: 40 meq via ORAL
  Filled 2023-02-05: qty 2

## 2023-02-05 MED ORDER — MIDODRINE HCL 5 MG PO TABS: 5.0000 mg | ORAL_TABLET | Freq: Three times a day (TID) | ORAL | 0 refills | Status: AC

## 2023-02-05 NOTE — Discharge Instructions (Signed)
 Advised to follow-up with primary care physician in 1 week. Advised to continue oxygen as required.

## 2023-02-05 NOTE — Care Management Important Message (Signed)
 Important Message  Patient Details IM Letter given. Name: Sabrina Roberson MRN: [id number] Date of Birth: [birth year]   Important Message Given:  Yes - Medicare IM     Caren Macadam 02/05/2023, 11:23 AM

## 2023-02-05 NOTE — Discharge Summary (Signed)
 Physician Discharge Summary  Sabrina Roberson FMW:[id number] DOB: 11-Feb-1970 DOA: 01/19/2023  PCP: Knute Thersia Bitters, FNP  Admit date: 01/19/2023  Discharge date: 02/05/2023  Admitted From: Home.  Disposition:  Home  Recommendations for Outpatient Follow-up:  Follow up with PCP in 1-2 weeks. Please obtain BMP/CBC in one week. Patient is being discharged on home oxygen 2 L/min.  Home Health:None Equipment/Devices:Home oxygen @ 2l/min  Discharge Condition: Stable CODE STATUS:Full code Diet recommendation: Heart Healthy   Brief [facility] Course: This 53 year old female with history of mild asthma, diabetes mellitus type 2, hypertension, dyslipidemia, hypothyroidism, GERD, tobacco smoking presented with worsening shortness of breath and cough. She was found to be very hypoxic on presentation and was intubated and admitted to ICU. She was found to have influenza A pneumonia with ARDS along with septic shock and started on broad-spectrum antibiotics and Tamiflu . Subsequently, tracheal aspirate was positive for MRSA: Antibiotic switched to linezolid . She was extubated on 01/23/2023 but was reintubated on 01/25/2023 for increasing respiratory distress/anxiety. She also received few days of IV diuretics. She was extubated again on 01/29/2023. She has been weaned off pressors. She was transferred to [facility] service from 02/01/2023 onwards. Patient continued to make significant improvement in breathing. She was successfully weaned down to 2 L of supplemental oxygen.  She has completed antibiotics course.  Patient also tolerated regular diet.  Denies any other concerns.  She wants to be discharged.  Patient is being discharged home on 2 L of supplemental oxygen.   Discharge Diagnoses:  Principal Problem:   Septic shock (HCC) Active Problems:   ARDS (adult respiratory distress syndrome) (HCC)  Acute hypoxic respiratory failure, Multifactorial: ARDS, Influenza A pneumonia, MRSA pneumonia Past  history of mild asthma -She has required intubation twice during this hospitalization and was finally extubated again on 01/29/2023.   -Care transferred to [facility] service from 02/01/2023 onwards and pulmonary has signed off. -2D echo had shown LVEF of 50 to 55%. No RWMA. -Patient received few days of IV diuretics.  Currently not on any diuretics. -Completed Tamiflu  on 01/24/2023.  DC'd Zyvox  on 02/01/2023 after 7 days of treatment. -Currently on 2 L oxygen via nasal cannula.  Wean off as able.   -Patient qualified for home oxygen.  Home oxygen arranged.   Septic shock: POA; -Completed antibiotics course.  Blood pressure still on the lower side but stable.   -Continue midodrine . -Shock has resolved.   Leukocytosis: -Mild.  Improving, Continue to monitor.   Anemia of chronic disease -From chronic illnesses.  Hemoglobin currently stable.  Monitor intermittently   Hyponatremia / Hypokalemia: -Resolved.   Diabetes mellitus type 2: -Continue SSI.   Anxiety Disorder: -Continue Valium  5 mg every 12 hours as needed for anxiety   Physical deconditioning: -PT recommending home health PT   Poor oral intake: -Encouraged oral intake.  Dietitian following.  Diet as per SLP recommendations. -Oral intake improving.  Cortrek removed yesterday.  She has been tolerating diet.   Tobacco abuse: -Patient is an active smoker prior to presentation.   -Counseled regarding cessation by [facility] provider.  Currently on nicotine  patch   GERD: -Continue Protonix .   Hyperlipidemia: -Continue statin.    Discharge Instructions  Discharge Instructions     Call MD for:  difficulty breathing, headache or visual disturbances   Complete by: As directed    Call MD for:  persistant dizziness or light-headedness   Complete by: As directed    Diet - low sodium heart healthy   Complete by:  As directed    Diet Carb Modified   Complete by: As directed    Increase activity slowly   Complete by: As directed        Allergies as of 02/05/2023       Reactions   Latex Anaphylaxis, Swelling   Codeine Nausea And Vomiting   Other reaction(s): GI Upset (intolerance), Vomiting (intolerance)   Ibuprofen Other (See Comments)   GI upset, burning sensation   Oxycodone  Nausea And Vomiting   Pre-medication with phenergan  needed.   Tape Itching, Rash   Please use paper tape only.        Medication List     STOP taking these medications    albuterol  108 (90 Base) MCG/ACT inhaler Commonly known as: VENTOLIN  HFA   mupirocin  cream 2 % Commonly known as: BACTROBAN        TAKE these medications    atorvastatin  20 MG tablet Commonly known as: LIPITOR Take 1 tablet (20 mg total) by mouth at bedtime.   buPROPion  ER 100 MG 12 hr tablet Commonly known as: WELLBUTRIN  SR Take 1 tablet (100 mg total) by mouth daily.   diazepam  5 MG tablet Commonly known as: VALIUM  Take 1 tablet (5 mg total) by mouth 3 (three) times daily as needed for anxiety. What changed: when to take this   dicyclomine  10 MG capsule Commonly known as: BENTYL  Take 1 capsule (10 mg total) by mouth 4 (four) times daily -  before meals and at bedtime. What changed:  when to take this reasons to take this   gabapentin  600 MG tablet Commonly known as: NEURONTIN  Take 1 tablet (600 mg total) by mouth at bedtime.   levothyroxine  100 MCG tablet Commonly known as: SYNTHROID  TAKE 1 TABLET BY MOUTH DAILY BEFORE BREAKFAST.   metFORMIN  500 MG 24 hr tablet Commonly known as: GLUCOPHAGE -XR Take 1 tablet (500 mg total) by mouth daily with breakfast.   midodrine  5 MG tablet Commonly known as: PROAMATINE  Take 1 tablet (5 mg total) by mouth 3 (three) times daily with meals.   nystatin cream Commonly known as: MYCOSTATIN Apply 1 Application topically 2 (two) times daily.   pantoprazole  40 MG tablet Commonly known as: PROTONIX  Take 1 tablet (40 mg total) by mouth daily. Start taking on: February 06, 2023   promethazine  25 MG  tablet Commonly known as: PHENERGAN  Take 1 tablet (25 mg total) by mouth every 8 (eight) hours as needed for nausea or vomiting.   propranolol  80 MG tablet Commonly known as: INDERAL  Take 1 tablet (80 mg total) by mouth daily.   traZODone  100 MG tablet Commonly known as: DESYREL  Take 1 tablet (100 mg total) by mouth at bedtime.   Trelegy Ellipta  100-62.5-25 MCG/ACT Aepb Generic drug: Fluticasone-Umeclidin-Vilant Inhale 1 puff into the lungs daily as needed (wheezing/SOB).               Durable Medical Equipment  (From admission, onward)           Start     Ordered   02/05/23 1034  DME Oxygen  Once       Question Answer Comment  Length of Need Lifetime   Mode or (Route) Nasal cannula   Liters per Minute 2   Frequency Continuous (stationary and portable oxygen unit needed)   Oxygen conserving device Yes   Oxygen delivery system Gas      02/05/23 1033            Follow-up Information     Caudle,  Thersia Bitters, FNP Follow up in 1 week(s).   Specialty: Family Medicine Contact information: [address] [place] [address] [phone]                Allergies  Allergen Reactions   Latex Anaphylaxis and Swelling   Codeine Nausea And Vomiting    Other reaction(s): GI Upset (intolerance), Vomiting (intolerance)   Ibuprofen Other (See Comments)    GI upset, burning sensation   Oxycodone  Nausea And Vomiting    Pre-medication with phenergan  needed.   Tape Itching and Rash    Please use paper tape only.    Consultations: PCCM   Procedures/Studies: DG CHEST PORT 1 VIEW Result Date: 01/29/2023 CLINICAL DATA:  Hypoxic scratch set acute hypoxemic respiratory failure. EXAM: PORTABLE CHEST 1 VIEW COMPARISON:  01/27/2023 FINDINGS: Right IJ catheter tip is in the projection of the distal SVC. ETT tip is stable above the carina. Enteric tube tip and side port are in the stomach. Stable cardiomediastinal contours. No change in  interstitial and airspace opacities within both lungs, most severe within the right upper lobe and left lower lung. IMPRESSION: 1. Stable support apparatus. 2. No change in bilateral interstitial and airspace opacities. Electronically Signed   By: Waddell Calk M.D.   On: 01/29/2023 11:02   DG Abd 1 View Result Date: 01/29/2023 CLINICAL DATA:  Nasogastric tube placement EXAM: ABDOMEN - 1 VIEW COMPARISON:  01/24/2023 FINDINGS: An enteric tube tip and side-port are over the proximal stomach. Feeding tube is initially looped in the stomach and on the subsequent image has its tip near the pylorus. No gas dilated bowel. There are cholecystectomy clips. Patchy opacification of the lower lungs. IMPRESSION: On the second image, weighted feeding tube tip is at the pylorus and a gastric tube tip is at the proximal stomach. Electronically Signed   By: Dorn Roulette M.D.   On: 01/29/2023 09:22   DG CHEST PORT 1 VIEW Result Date: 01/27/2023 CLINICAL DATA:  Hypoxemia EXAM: PORTABLE CHEST 1 VIEW COMPARISON:  X-ray 01/26/2023 and older. FINDINGS: Stable ET tube, enteric tube and right IJ catheter. Lapping cardiac leads. Tiny pleural effusions. Patchy bilateral lung opacities identified once again with interstitial components. Overall increasing from previous particularly in the right upper lung. IMPRESSION: Multifocal parenchymal and interstitial opacities again seen with a tiny pleural effusions. Slight increased opacity right upper lung. Recommend continued follow up Electronically Signed   By: Ranell Bring M.D.   On: 01/27/2023 14:00   DG CHEST PORT 1 VIEW Result Date: 01/26/2023 CLINICAL DATA:  Pneumonia.  Bilateral opacities. EXAM: PORTABLE CHEST 1 VIEW COMPARISON:  01/24/2023. FINDINGS: Low lung volume. Redemonstration of diffuse heterogeneous alveolar and interstitial opacities throughout bilateral lungs. No significant interval change. Redemonstration of blunting of left lateral costophrenic angle, which may be  due to pleural effusion versus overlying lung parenchymal opacities. Right lateral costophrenic angle is clear. Stable cardio-mediastinal silhouette. No acute osseous abnormalities. The soft tissues are within normal limits. Tube/lines: Endotracheal tube tip is 4.0 cm from the carina. An enteric tube extends below diaphragm, tip and sideport overlying the stomach. Right IJ central venous catheter noted with its tip overlying the cavoatrial junction region. There are multiple overlying monitoring electrodes. The IMPRESSION: *Redemonstration of diffuse heterogeneous alveolar and interstitial opacities throughout bilateral lungs. Differential diagnosis includes multilobar pneumonia, ARDS, pulmonary edema, etc. Correlate clinically. No significant interval change. *Tubes and lines as described above. Electronically Signed   By: Ree Molt M.D.   On: 01/26/2023 09:01  DG Abd 1 View Result Date: 01/24/2023 CLINICAL DATA:  NG placement. EXAM: ABDOMEN - 1 VIEW COMPARISON:  Earlier radiograph dated 01/24/2023. FINDINGS: Enteric tube with tip the left upper abdomen in the region of the gastric fundus. IMPRESSION: Enteric tube with tip in the gastric fundus. Electronically Signed   By: Vanetta Chou M.D.   On: 01/24/2023 18:38   DG Abd 1 View Result Date: 01/24/2023 CLINICAL DATA:  NG placement. EXAM: ABDOMEN - 1 VIEW COMPARISON:  None Available. FINDINGS: Partially visualized enteric tube with tip in the left upper abdomen in the region of the gastric fundus. Bilateral pulmonary opacities noted. IMPRESSION: Enteric tube with tip in the region of the gastric fundus. Electronically Signed   By: Vanetta Chou M.D.   On: 01/24/2023 18:15   DG CHEST PORT 1 VIEW Result Date: 01/24/2023 CLINICAL DATA:  Intubated EXAM: PORTABLE CHEST 1 VIEW COMPARISON:  X-ray 01/23/2023 and older. FINDINGS: ET tube now in place with the tip seen proximally 4 cm above the carina. Stable right IJ catheter. Overlapping cardiac  leads. Film is rotated to the right. Stable cardiopericardial silhouette. No pneumothorax or effusion. Diffuse hazy interstitial and parenchymal opacities are again seen, most confluence in the left lung base. Overall decreased from the prior x-ray. IMPRESSION: New ET tube in place. Decreasing hazy bilateral parenchymal opacities with significant residual. Recommend continued follow up Electronically Signed   By: Ranell Bring M.D.   On: 01/24/2023 16:06   DG CHEST PORT 1 VIEW Result Date: 01/23/2023 CLINICAL DATA:  Shortness of breath, respiratory failure EXAM: PORTABLE CHEST 1 VIEW COMPARISON:  01/23/2023 FINDINGS: Interval removal of endotracheal tube. Right central line remains in place with the tip in the right atrium, unchanged. Severe diffuse bilateral airspace disease, stable. Heart is normal size. No effusions or pneumothorax. IMPRESSION: Interval extubation. Severe diffuse bilateral airspace disease, unchanged. Electronically Signed   By: Franky Crease M.D.   On: 01/23/2023 20:29   DG Chest Port 1 View Result Date: 01/23/2023 CLINICAL DATA:  4373 with acute respiratory failure. EXAM: PORTABLE CHEST 1 VIEW COMPARISON:  Chest 01/21/2023 at 4:55 a.m. FINDINGS: 4:49 a.m. right IJ central line tip remains at just below the superior cavoatrial junction. ETT tip 3.7 cm from the carina. Esophageal probe terminates in the subcarinal esophagus as before. NGT enters the stomach with the side hole and tip not filmed. Interstitial and patchy dense airspace disease extending out from both hila is again noted consistent with edema, pneumonia or combination. The opacities are interval worsened in the peripheral lungs and apices, not notably changed more centrally. The pulmonary vasculature is largely obscured. There is mild cardiomegaly, small layering pleural effusions. The mediastinum is stable. No new osseous findings. Overlying monitor wires. IMPRESSION: 1. Interval worsening of interstitial and patchy dense  airspace disease in the peripheral lungs and apices, not notably changed more centrally. 2. Small layering pleural effusions. 3. Stable support apparatus. Electronically Signed   By: Francis Quam M.D.   On: 01/23/2023 06:16   DG Chest Port 1 View Result Date: 01/21/2023 CLINICAL DATA:  53 year old female with history of acute respiratory failure. EXAM: PORTABLE CHEST 1 VIEW COMPARISON:  Chest x-ray 01/20/2023. FINDINGS: An endotracheal tube is in place with tip 4.7 cm above the carina. There is a right-sided internal jugular central venous catheter with tip terminating in the superior cavoatrial junction. A nasogastric tube is seen extending into the stomach, however, the tip of the nasogastric tube extends below the lower margin of the image.  Esophageal temperature probe incidentally noted. Lung volumes are low. Widespread interstitial prominence and patchy ill-defined airspace disease scattered throughout all aspects of the lungs bilaterally, similar to the prior study. Small left pleural effusion. No definite right pleural effusion. No pneumothorax. Pulmonary vasculature is obscured. Heart size is normal. Upper mediastinal contours are distorted by patient's rotation to the left. IMPRESSION: 1. Support apparatus, as above. 2. The appearance the chest is once again most suggestive of severe multilobar bilateral bronchopneumonia. 3. Small left pleural effusion. Electronically Signed   By: Toribio Aye M.D.   On: 01/21/2023 07:11   ECHOCARDIOGRAM COMPLETE Result Date: 01/20/2023    ECHOCARDIOGRAM REPORT   Patient Name:   TALER KUSHNER Vanderheiden Date of Exam: 01/20/2023 Medical Rec #:  [id number]       Height:       64.0 in Accession #:    [id number]      Weight:       139.6 lb Date of Birth:  [birth year]       BSA:          1.679 m Patient Age:    52 years        BP:           108/63 mmHg Patient Gender: F               HR:           103 bpm. Exam Location:  Inpatient Procedure: 2D Echo, Cardiac Doppler, Color  Doppler and Intracardiac            Opacification Agent Indications:    Shock  History:        Patient has prior history of Echocardiogram examinations, most                 recent 01/16/2023. Signs/Symptoms:Dyspnea; Risk                 Factors:Hypertension.  Sonographer:    Lanell Maduro Referring Phys: [id number] JOSHUA C SMITH IMPRESSIONS  1. Left ventricular ejection fraction, by estimation, is 50 to 55%. The left ventricle has low normal function. The left ventricle has no regional wall motion abnormalities. Left ventricular diastolic parameters were normal.  2. Right ventricular systolic function is low normal. The right ventricular size is normal.  3. The mitral valve is normal in structure. No evidence of mitral valve regurgitation. No evidence of mitral stenosis.  4. The aortic valve is normal in structure. Aortic valve regurgitation is not visualized. No aortic stenosis is present.  5. The inferior vena cava is dilated in size with <50% respiratory variability, suggesting right atrial pressure of 15 mmHg. Comparison(s): No significant change from prior study. FINDINGS  Left Ventricle: Left ventricular ejection fraction, by estimation, is 50 to 55%. The left ventricle has low normal function. The left ventricle has no regional wall motion abnormalities. The left ventricular internal cavity size was normal in size. There is no left ventricular hypertrophy. Left ventricular diastolic parameters were normal. Right Ventricle: The right ventricular size is normal. No increase in right ventricular wall thickness. Right ventricular systolic function is low normal. Left Atrium: Left atrial size was normal in size. Right Atrium: Right atrial size was normal in size. Pericardium: There is no evidence of pericardial effusion. Mitral Valve: The mitral valve is normal in structure. No evidence of mitral valve regurgitation. No evidence of mitral valve stenosis. Tricuspid Valve: The tricuspid valve is normal in  structure. Tricuspid valve regurgitation is not demonstrated. No  evidence of tricuspid stenosis. Aortic Valve: The aortic valve is normal in structure. Aortic valve regurgitation is not visualized. No aortic stenosis is present. Pulmonic Valve: The pulmonic valve was normal in structure. Pulmonic valve regurgitation is not visualized. No evidence of pulmonic stenosis. Aorta: The aortic root is normal in size and structure. Venous: The inferior vena cava is dilated in size with less than 50% respiratory variability, suggesting right atrial pressure of 15 mmHg. IAS/Shunts: No atrial level shunt detected by color flow Doppler.  LEFT VENTRICLE PLAX 2D LVIDd:         4.35 cm      Diastology LVIDs:         3.70 cm      LV e' medial:    7.07 cm/s LV PW:         0.95 cm      LV E/e' medial:  7.6 LV IVS:        0.90 cm      LV e' lateral:   7.40 cm/s LVOT diam:     2.10 cm      LV E/e' lateral: 7.3 LV SV:         52 LV SV Index:   31 LVOT Area:     3.46 cm  LV Volumes (MOD) LV vol d, MOD A2C: 86.4 ml LV vol d, MOD A4C: 106.0 ml LV vol s, MOD A2C: 45.5 ml LV vol s, MOD A4C: 42.7 ml LV SV MOD A2C:     40.9 ml LV SV MOD A4C:     106.0 ml LV SV MOD BP:      51.3 ml RIGHT VENTRICLE            IVC RV Basal diam:  3.10 cm    IVC diam: 2.80 cm RV S prime:     9.79 cm/s TAPSE (M-mode): 1.8 cm LEFT ATRIUM           Index        RIGHT ATRIUM          Index LA diam:      3.10 cm 1.85 cm/m   RA Area:     8.85 cm LA Vol (A2C): 28.1 ml 16.74 ml/m  RA Volume:   15.90 ml 9.47 ml/m  AORTIC VALVE LVOT Vmax:   107.00 cm/s LVOT Vmean:  71.200 cm/s LVOT VTI:    0.149 m  AORTA Ao Root diam: 3.05 cm Ao Asc diam:  3.30 cm MITRAL VALVE MV Area (PHT): 6.17 cm    SHUNTS MV Decel Time: 123 msec    Systemic VTI:  0.15 m MV E velocity: 53.90 cm/s  Systemic Diam: 2.10 cm MV A velocity: 80.20 cm/s MV E/A ratio:  0.67 Morene Brownie Electronically signed by Morene Brownie Signature Date/Time: 01/20/2023/4:01:23 PM    Final    DG CHEST PORT 1  VIEW Result Date: 01/20/2023 CLINICAL DATA:  Central line placement. EXAM: PORTABLE CHEST 1 VIEW COMPARISON:  Radiographs earlier the same date and 01/19/2023. CT 01/19/2023. FINDINGS: 1006 hours. New right IJ central venous catheter projects to the level of the superior cavoatrial junction. The endotracheal and enteric tubes are unchanged in position. The heart size and mediastinal contours are stable. Multifocal left-greater-than-right airspace opacities have not significantly changed. There may be a small amount of pleural fluid on the left. No evidence of pneumothorax. IMPRESSION: 1. New right IJ central venous catheter projects to the level of the superior cavoatrial junction. No evidence of pneumothorax. 2. No  significant change in multifocal left-greater-than-right airspace opacities, most consistent with multilobar pneumonia. Electronically Signed   By: Elsie Perone M.D.   On: 01/20/2023 10:38   DG Chest Port 1 View Result Date: 01/20/2023 CLINICAL DATA:  Respiratory failure. EXAM: PORTABLE CHEST 1 VIEW COMPARISON:  Same day. FINDINGS: Stable cardiomediastinal silhouette. Endotracheal and nasogastric tubes are unchanged. Stable bilateral lung opacities are noted. Bony thorax is unremarkable. IMPRESSION: Stable support apparatus.  Stable bilateral lung opacities. Electronically Signed   By: Lynwood Landy Raddle M.D.   On: 01/20/2023 09:37   DG CHEST PORT 1 VIEW Result Date: 01/20/2023 CLINICAL DATA:  Endotracheal tube EXAM: PORTABLE CHEST 1 VIEW COMPARISON:  01/19/2023 FINDINGS: An endotracheal tube is present with tip measuring 2.8 cm above the carina. An enteric tube is present with tip projecting over the left upper quadrant consistent with location in the body of the stomach. Shallow inspiration. Cardiac enlargement. Bilateral perihilar infiltrates are similar to prior study. No pleural effusion or pneumothorax. IMPRESSION: Appliances appear in satisfactory position. Cardiac enlargement. Bilateral  perihilar infiltrates, similar to prior study. Electronically Signed   By: Elsie Gravely M.D.   On: 01/20/2023 00:37   CT CHEST ABDOMEN PELVIS W CONTRAST Result Date: 01/19/2023 CLINICAL DATA:  Sepsis.  Shortness of breath.  Cough. EXAM: CT CHEST, ABDOMEN, AND PELVIS WITH CONTRAST TECHNIQUE: Multidetector CT imaging of the chest, abdomen and pelvis was performed following the standard protocol during bolus administration of intravenous contrast. RADIATION DOSE REDUCTION: This exam was performed according to the departmental dose-optimization program which includes automated exposure control, adjustment of the mA and/or kV according to patient size and/or use of iterative reconstruction technique. CONTRAST:  OMNIPAQUE  IOHEXOL  300 MG/ML  SOLN COMPARISON:  10/03/2022 FINDINGS: CT CHEST FINDINGS Cardiovascular: Heart is normal size. Aorta is normal caliber. Mediastinum/Nodes: No mediastinal, hilar, or axillary adenopathy. Endotracheal tube tip in the lower trachea. Thyroid  and esophagus unremarkable. Lungs/Pleura: Extensive bilateral airspace opacities. No significant effusions. Musculoskeletal: Chest wall soft tissues are unremarkable. No acute bony abnormality. CT ABDOMEN PELVIS FINDINGS Hepatobiliary: No focal liver abnormality is seen. Status post cholecystectomy. No biliary dilatation. Pancreas: No focal abnormality or ductal dilatation. Spleen: No focal abnormality.  Normal size. Adrenals/Urinary Tract: Adrenal glands normal. 2.8 cm cyst in the upper pole of the left kidney warrants no additional follow-up imaging. No stones or hydronephrosis. Urinary bladder unremarkable. Foley catheter in place. Stomach/Bowel: NG tube in the stomach. Stomach and small bowel decompressed, unremarkable. There is wall thickening involving the cecum and ascending colon as well as distal transverse colon and splenic flexure compatible with colitis. No bowel obstruction. Vascular/Lymphatic: Aortic atherosclerosis. No  evidence of aneurysm or adenopathy. Reproductive: Prior hysterectomy.  No adnexal masses. Other: No free fluid or free air. Musculoskeletal: No acute bony abnormality. 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 IMPRESSION: Extensive bilateral airspace disease throughout both lungs. This is concerning for multifocal pneumonia. This could also reflect edema. Wall thickening involving the right colon and distal transverse colon/splenic flexure compatible with infectious or inflammatory colitis. Aortic atherosclerosis. Electronically Signed   By: Franky Crease M.D.   On: 01/19/2023 19:22   DG Chest Portable 1 View Result Date: 01/19/2023 CLINICAL DATA:  Intubation and nasogastric tube placement. EXAM: PORTABLE CHEST 1 VIEW COMPARISON:  01/19/2023 at 5:15 p.m. FINDINGS: Interval placement of enteric tube which courses into the stomach and off the film as tip is not visualized. Interval placement of endotracheal  tube which has tip 2.9 cm above the carina. Lungs are adequately inflated demonstrate moderate bilateral patchy airspace opacification with slight interval worsening. Evidence of small right effusion. Remainder of the exam is unchanged. IMPRESSION: 1. Interval placement of endotracheal tube and enteric tube as described. 2. Moderate bilateral patchy airspace opacification with slight interval worsening likely multifocal pneumonia. Small right effusion. Electronically Signed   By: Toribio Agreste M.D.   On: 01/19/2023 19:15   DG Chest Portable 1 View Result Date: 01/19/2023 CLINICAL DATA:  Cough.  Unresponsive in ED. EXAM: PORTABLE CHEST 1 VIEW COMPARISON:  Chest radiograph dated October 09, 2014. FINDINGS: The heart size and mediastinal contours are within normal limits. Multifocal bilateral airspace opacities, most pronounced at the left lung base and right mid to upper lung zone. No definite pleural effusion. No pneumothorax. No  acute osseous abnormality. IMPRESSION: Multifocal bilateral airspace opacities are concerning for multifocal pneumonia, ARDS, or pulmonary edema. Electronically Signed   By: Harrietta Sherry M.D.   On: 01/19/2023 17:44   ECHOCARDIOGRAM COMPLETE Result Date: 01/16/2023    ECHOCARDIOGRAM REPORT   Patient Name:   SEFORA TIETJE Kukla Date of Exam: 01/16/2023 Medical Rec #:  [id number]       Height:       64.0 in Accession #:    [id number]      Weight:       152.2 lb Date of Birth:  1970/10/03       BSA:          1.742 m Patient Age:    52 years        BP:           98/77 mmHg Patient Gender: F               HR:           64 bpm. Exam Location:  Outpatient Procedure: 2D Echo, 3D Echo, Color Doppler, Cardiac Doppler and Strain Analysis Indications:    Chest Pain  History:        Patient has prior history of Echocardiogram examinations, most                 recent 06/18/2012. TIA; Risk Factors:Hypertension, Diabetes,                 Current Smoker and Dyslipidemia. 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  Sonographer:    Orvil Holmes RDCS Referring Phys: [id number] ALEXIS OLIVIA CAUDLE  Sonographer Comments: Patient told me yesterday that she had an episode of almost passing and lightheadedness. Her blood pressure after the echo today was 98/77 which stated is low for her. Her symptoms had improved today but she did say she is having increased palpitations over the last three weeks with intermittent chest pain. I offered to take her to the emergency room which she did not want to go. I encouraged her to call 911 if symptoms worsened. I also told her to check her blood pressure at home and notify her primary office if continues to be low. Made primary aware. IMPRESSIONS  1. Left ventricular ejection fraction, by estimation, is 60 to 65%. Left ventricular ejection fraction by 3D volume is 64 %. The left ventricle has normal function. The left ventricle has no regional wall motion  abnormalities. Left ventricular diastolic  parameters are consistent with Grade I diastolic dysfunction (impaired relaxation). The average left ventricular  global longitudinal strain is -18.6 %. The global longitudinal strain is normal.  2. Right ventricular systolic function is normal. The right ventricular size is normal.  3. The mitral valve is normal in structure. No evidence of mitral valve regurgitation. No evidence of mitral stenosis.  4. The aortic valve is tricuspid. Aortic valve regurgitation is not visualized. No aortic stenosis is present.  5. The inferior vena cava is normal in size with greater than 50% respiratory variability, suggesting right atrial pressure of 3 mmHg. FINDINGS  Left Ventricle: Left ventricular ejection fraction, by estimation, is 60 to 65%. Left ventricular ejection fraction by 3D volume is 64 %. The left ventricle has normal function. The left ventricle has no regional wall motion abnormalities. The average left ventricular global longitudinal strain is -18.6 %. The global longitudinal strain is normal. The left ventricular internal cavity size was normal in size. There is no left ventricular hypertrophy. Left ventricular diastolic parameters are consistent  with Grade I diastolic dysfunction (impaired relaxation). Right Ventricle: The right ventricular size is normal. No increase in right ventricular wall thickness. Right ventricular systolic function is normal. Left Atrium: Left atrial size was normal in size. Right Atrium: Right atrial size was normal in size. Pericardium: There is no evidence of pericardial effusion. Mitral Valve: The mitral valve is normal in structure. No evidence of mitral valve regurgitation. No evidence of mitral valve stenosis. Tricuspid Valve: The tricuspid valve is normal in structure. Tricuspid valve regurgitation is not demonstrated. No evidence of tricuspid stenosis. Aortic Valve: The aortic valve is tricuspid. Aortic valve regurgitation is not  visualized. No aortic stenosis is present. Pulmonic Valve: The pulmonic valve was normal in structure. Pulmonic valve regurgitation is mild. No evidence of pulmonic stenosis. Aorta: The aortic root is normal in size and structure. Venous: The inferior vena cava is normal in size with greater than 50% respiratory variability, suggesting right atrial pressure of 3 mmHg. IAS/Shunts: No atrial level shunt detected by color flow Doppler.  LEFT VENTRICLE PLAX 2D LVIDd:         4.72 cm         Diastology LVIDs:         3.28 cm         LV e' medial:    8.49 cm/s LV PW:         1.27 cm         LV E/e' medial:  8.1 LV IVS:        0.94 cm         LV e' lateral:   7.40 cm/s LVOT diam:     2.10 cm         LV E/e' lateral: 9.3 LV SV:         67 LV SV Index:   38              2D LVOT Area:     3.46 cm        Longitudinal                                Strain                                2D Strain GLS  -18.6 %  Avg:                                 3D Volume EF                                LV 3D EF:    Left                                             ventricul                                             ar                                             ejection                                             fraction                                             by 3D                                             volume is                                             64 %.                                 3D Volume EF:                                3D EF:        64 %                                LV EDV:       119 ml                                LV ESV:       42 ml                                LV SV:        77 ml RIGHT VENTRICLE RV Basal diam:  3.70  cm RV Mid diam:    3.22 cm RV S prime:     11.90 cm/s TAPSE (M-mode): 2.1 cm LEFT ATRIUM             Index        RIGHT ATRIUM           Index LA diam:        3.80 cm 2.18 cm/m   RA Area:     15.10 cm LA Vol (A2C):   50.3 ml 28.88 ml/m  RA Volume:   37.30  ml  21.41 ml/m LA Vol (A4C):   44.4 ml 25.49 ml/m LA Biplane Vol: 46.5 ml 26.69 ml/m  AORTIC VALVE LVOT Vmax:   96.80 cm/s LVOT Vmean:  59.200 cm/s LVOT VTI:    0.193 m  AORTA Ao Root diam: 3.20 cm Ao Asc diam:  3.10 cm MITRAL VALVE               TRICUSPID VALVE MV Area (PHT): 3.27 cm    TR Peak grad:   10.8 mmHg MV Decel Time: 232 msec    TR Vmax:        164.00 cm/s MV E velocity: 68.60 cm/s MV A velocity: 61.60 cm/s  SHUNTS MV E/A ratio:  1.11        Systemic VTI:  0.19 m                            Systemic Diam: 2.10 cm Oneil Parchment MD Electronically signed by Oneil Parchment MD Signature Date/Time: 01/16/2023/3:58:58 PM    Final     Subjective: Patient was seen and examined at bedside.  Overnight events noted.   Patient reports doing much better and wants to be discharged. She has ambulated,  remains on 2 L oxygen.  Denies any concerns and wants to be discharged.  Discharge Exam: Vitals:   02/05/23 0553 02/05/23 0913  BP: 107/63 118/66  Pulse:  86  Resp: 17 15  Temp: 98.1 F (36.7 C) 98.1 F (36.7 C)  SpO2: 97% 97%   Vitals:   02/04/23 2145 02/05/23 0139 02/05/23 0553 02/05/23 0913  BP: 110/60 136/62 107/63 118/66  Pulse: 74 83  86  Resp: 19 17 17 15   Temp: 98.6 F (37 C) 98.2 F (36.8 C) 98.1 F (36.7 C) 98.1 F (36.7 C)  TempSrc:  Oral Oral   SpO2: 96% 95% 97% 97%  Weight:      Height:        General: Pt is alert, awake, not in acute distress Cardiovascular: RRR, S1/S2 +, no rubs, no gallops Respiratory: CTA bilaterally, no wheezing, no rhonchi Abdominal: Soft, NT, ND, bowel sounds + Extremities: no edema, no cyanosis    The results of significant diagnostics from this hospitalization (including imaging, microbiology, ancillary and laboratory) are listed below for reference.     Microbiology: No results found for this or any previous visit (from the past 240 hours).   Labs: BNP (last 3 results) Recent Labs    11/04/22 1056 01/20/23 1030  BNP 8.6 268.5*    Basic Metabolic Panel: Recent Labs  Lab 01/31/23 0534 02/01/23 0444 02/02/23 0344 02/03/23 0306 02/05/23 0323  NA 133* 134* 136 138 134*  K 3.6 3.8 3.8 4.0 3.1*  CL 93* 92* 94* 94* 95*  CO2 35* 36* 34* 35* 32  GLUCOSE 104* 85 81 90 89  BUN 9 9 12 12 10   CREATININE 0.47 0.65  0.46 0.71 0.65  CALCIUM  8.1* 8.2* 8.4* 8.7* 8.2*  MG 2.3 2.4 2.4 2.4 2.2  PHOS  --   --   --   --  4.2   Liver Function Tests: No results for input(s): AST, ALT, ALKPHOS, BILITOT, PROT, ALBUMIN in the last 168 hours. No results for input(s): LIPASE, AMYLASE in the last 168 hours. No results for input(s): AMMONIA in the last 168 hours. CBC: Recent Labs  Lab 01/31/23 0534 02/01/23 0444 02/02/23 0344 02/03/23 0306 02/05/23 0323  WBC 11.1* 10.7* 11.1* 10.9* 9.3  HGB 8.8* 8.4* 8.3* 8.7* 8.0*  HCT 27.7* 28.1* 26.6* 29.6* 26.5*  MCV 93.0 94.3 95.0 97.4 96.7  PLT 228 279 315 394 486*   Cardiac Enzymes: No results for input(s): CKTOTAL, CKMB, CKMBINDEX, TROPONINI in the last 168 hours. BNP: Invalid input(s): POCBNP CBG: Recent Labs  Lab 02/04/23 1114 02/04/23 1538 02/04/23 1939 02/05/23 0727 02/05/23 1134  GLUCAP 95 128* 84 77 80   D-Dimer No results for input(s): DDIMER in the last 72 hours. Hgb A1c No results for input(s): HGBA1C in the last 72 hours. Lipid Profile No results for input(s): CHOL, HDL, LDLCALC, TRIG, CHOLHDL, LDLDIRECT in the last 72 hours. Thyroid  function studies No results for input(s): TSH, T4TOTAL, T3FREE, THYROIDAB in the last 72 hours.  Invalid input(s): FREET3 Anemia work up No results for input(s): VITAMINB12, FOLATE, FERRITIN, TIBC, IRON, RETICCTPCT in the last 72 hours. Urinalysis    Component Value Date/Time   COLORURINE YELLOW 09/27/2013 1225   APPEARANCEUR CLEAR 09/27/2013 1225   LABSPEC 1.008 09/27/2013 1225   PHURINE 6.0 09/27/2013 1225   GLUCOSEU NEGATIVE 09/27/2013 1225   HGBUR  TRACE (A) 09/27/2013 1225   BILIRUBINUR negative 09/18/2022 1145   KETONESUR negative 09/18/2022 1145   KETONESUR NEGATIVE 09/27/2013 1225   PROTEINUR NEGATIVE 09/27/2013 1225   UROBILINOGEN 0.2 09/18/2022 1145   UROBILINOGEN 0.2 09/27/2013 1225   NITRITE Negative 09/18/2022 1145   NITRITE NEGATIVE 09/27/2013 1225   LEUKOCYTESUR Negative 09/18/2022 1145   Sepsis Labs Recent Labs  Lab 02/01/23 0444 02/02/23 0344 02/03/23 0306 02/05/23 0323  WBC 10.7* 11.1* 10.9* 9.3   Microbiology No results found for this or any previous visit (from the past 240 hours).   Time coordinating discharge: Over 30 minutes  SIGNED:   Darcel Dawley, MD  [facility] 02/05/2023, 12:15 PM Pager   If 7PM-7AM, please contact night-coverage

## 2023-02-05 NOTE — Telephone Encounter (Signed)
 error

## 2023-02-05 NOTE — TOC Transition Note (Addendum)
 Transition of Care [facility]) - Discharge Note   Patient Details  Name: Sabrina Roberson MRN: [id number] Date of Birth: [birth year]  [facility] CM/SW Contact:  NORMAN ASPEN, LCSW Phone Number: 02/05/2023, 10:39 AM   Clinical Narrative:     Met with pt and spouse to review dc needs with anticipation of dc home today.  Both aware and agreeable to home Oxygen set up and have no agency preferences - order placed with [employer].  Portable tank will be delivered to hospital room and concentrator to the home.  HHPT has also been recommended, however, declines follow up.  Family to provide any needed assistance at dc.  No further TOC needs.  Final next level of care: Home/Self Care Barriers to Discharge: Barriers Resolved   Patient Goals and CMS Choice Patient states their goals for this hospitalization and ongoing recovery are:: return home          Discharge Placement                       Discharge Plan and Services Additional resources added to the After Visit Summary for                  DME Arranged: Oxygen DME Agency: [employer] Date DME Agency Contacted: 02/05/23 Time DME Agency Contacted: 1002 Representative spoke with at DME Agency: Darlyn            Social Drivers of Health (SDOH) Interventions SDOH Screenings   Food Insecurity: Patient Unable To Answer (01/19/2023)  Housing: Patient Unable To Answer (01/19/2023)  Transportation Needs: Patient Unable To Answer (01/19/2023)  Utilities: Not At Risk (01/19/2023)  Alcohol Screen: Low Risk  (10/10/2022)  Depression (PHQ2-9): Medium Risk (12/02/2022)  Financial Resource Strain: Low Risk  (12/02/2022)  Physical Activity: Unknown (12/02/2022)  Recent Concern: Physical Activity - Insufficiently Active (10/10/2022)  Social Connections: Unknown (12/02/2022)  Recent Concern: Social Connections - Moderately Isolated (10/10/2022)  Stress: Stress Concern Present (12/02/2022)  Tobacco Use: High Risk (01/19/2023)  Health  Literacy: Adequate Health Literacy (10/10/2022)     Readmission Risk Interventions    01/22/2023    8:55 AM  Readmission Risk Prevention Plan  Transportation Screening Complete  HRI or Home Care Consult Complete  Social Work Consult for Recovery Care Planning/Counseling Complete  Medication Review Oceanographer) Complete

## 2023-02-05 NOTE — Plan of Care (Signed)
  Problem: Education: Goal: Knowledge of General Education information will improve Description: Including pain rating scale, medication(s)/side effects and non-pharmacologic comfort measures Outcome: Progressing   Problem: Activity: Goal: Risk for activity intolerance will decrease Outcome: Progressing   Problem: Pain Management: Goal: General experience of comfort will improve Outcome: Progressing

## 2023-02-07 ENCOUNTER — Telehealth (HOSPITAL_BASED_OUTPATIENT_CLINIC_OR_DEPARTMENT_OTHER): Payer: Self-pay | Admitting: *Deleted

## 2023-02-07 NOTE — Telephone Encounter (Signed)
 Copied from CRM [id number]. Topic: Appointments - Scheduling Inquiry for Clinic >> Feb 07, 2023  8:55 AM Carlatta H wrote: Reason for CRM: Patient was discharged from hospital on 1/8 and needs a follow up in 14 days//There are no available appointment and per epic send to admin pool//

## 2023-02-07 NOTE — Telephone Encounter (Signed)
 Please schedule pt or see if this can be worked in to [employer] schedule

## 2023-02-10 NOTE — Telephone Encounter (Signed)
 Copied from CRM [id number]. Topic: Clinical - Medication Question >> Feb 10, 2023  9:43 AM Zacyrah J wrote: Reason for CRM: pt is taking- traZODone  (DESYREL ) 100 MG tablet and it's not helping her rest. She is waking up at 3am/4am and staying up all the way to 9pm. This been going on since Jan 8. Pt would like for Dr. Knute to send her something to help her rest. Pt is stating that if she rest more she can improve more in her day

## 2023-02-17 ENCOUNTER — Ambulatory Visit (INDEPENDENT_AMBULATORY_CARE_PROVIDER_SITE_OTHER): Payer: Medicare Other | Admitting: Family Medicine

## 2023-02-17 ENCOUNTER — Ambulatory Visit (HOSPITAL_BASED_OUTPATIENT_CLINIC_OR_DEPARTMENT_OTHER): Payer: Medicare Other | Admitting: Cardiology

## 2023-02-17 ENCOUNTER — Encounter (HOSPITAL_BASED_OUTPATIENT_CLINIC_OR_DEPARTMENT_OTHER): Payer: Self-pay | Admitting: Family Medicine

## 2023-02-17 VITALS — BP 123/72 | HR 65 | Temp 98.1°F | Ht 64.0 in | Wt 132.7 lb

## 2023-02-17 DIAGNOSIS — R5381 Other malaise: Secondary | ICD-10-CM | POA: Diagnosis not present

## 2023-02-17 DIAGNOSIS — J15212 Pneumonia due to Methicillin resistant Staphylococcus aureus: Secondary | ICD-10-CM | POA: Diagnosis not present

## 2023-02-17 DIAGNOSIS — E876 Hypokalemia: Secondary | ICD-10-CM

## 2023-02-17 DIAGNOSIS — Z09 Encounter for follow-up examination after completed treatment for conditions other than malignant neoplasm: Secondary | ICD-10-CM

## 2023-02-17 DIAGNOSIS — F411 Generalized anxiety disorder: Secondary | ICD-10-CM

## 2023-02-17 DIAGNOSIS — R300 Dysuria: Secondary | ICD-10-CM | POA: Diagnosis not present

## 2023-02-17 DIAGNOSIS — D649 Anemia, unspecified: Secondary | ICD-10-CM

## 2023-02-17 LAB — POCT URINALYSIS DIP (CLINITEK)
Bilirubin, UA: NEGATIVE
Blood, UA: NEGATIVE
Glucose, UA: NEGATIVE mg/dL
Ketones, POC UA: NEGATIVE mg/dL
Leukocytes, UA: NEGATIVE
Nitrite, UA: NEGATIVE
POC PROTEIN,UA: NEGATIVE
Spec Grav, UA: <=1.005 — AB (ref 1.010–1.025)
Urobilinogen, UA: 0.2 U/dL
pH, UA: 5.5 (ref 5.0–8.0)

## 2023-02-17 MED ORDER — TRELEGY ELLIPTA 100-62.5-25 MCG/ACT IN AEPB: 1.0000 | INHALATION_SPRAY | Freq: Every day | RESPIRATORY_TRACT | 11 refills | Status: DC

## 2023-02-17 MED ORDER — DIAZEPAM 5 MG PO TABS: 5.0000 mg | ORAL_TABLET | Freq: Two times a day (BID) | ORAL | 3 refills | Status: DC | PRN

## 2023-02-17 MED ORDER — PROMETHAZINE-DM 6.25-15 MG/5ML PO SYRP: 5.0000 mL | ORAL_SOLUTION | Freq: Four times a day (QID) | ORAL | 1 refills | Status: DC | PRN

## 2023-02-17 MED ORDER — MIRTAZAPINE 15 MG PO TABS: 15.0000 mg | ORAL_TABLET | Freq: Every day | ORAL | 0 refills | Status: DC

## 2023-02-17 MED ORDER — ALBUTEROL SULFATE HFA 108 (90 BASE) MCG/ACT IN AERS: 2.0000 | INHALATION_SPRAY | Freq: Four times a day (QID) | RESPIRATORY_TRACT | 2 refills | Status: DC | PRN

## 2023-02-17 NOTE — Patient Instructions (Addendum)
Please consider Home PT therapy to help with weakness and conditioning   Trelegy Inhaler: use 1 puff once a day (rinse and spit after each use)   Use your albuterol inhaler at least 1-2 x per day to help with opening up lung fields to clear mucous.   Start use of Mucinex plain (Guaifenesin) daily for the next 1-2 weeks. Increase your clear fluids daily to thin mucous.

## 2023-02-17 NOTE — Progress Notes (Signed)
Subjective:   Sabrina Roberson [birth year] 02/17/2023  Chief Complaint  Patient presents with   Hospitalization Follow-up    Patient is following up after recent hospitalization. Patient states she has had some soreness since being out of the hospital and also states she has some soreness in chest after coughing. States that she is not able to rest at night.    HPI: Sabrina Roberson presents today for re-assessment and management of chronic medical conditions.  HOSPITAL FOLLOW UP:  Sabrina Roberson presents for hospital follow up.  Hospital/Facility: [facility]    Discharge Diagnosis: Acute hypoxic respiratory failure, multifactorial Admission Date: 01/19/23  Discharge Date: 02/05/2023 TCM call Date: N/a  New Medications: None  Discharge Instructions: Repeat BMP/CBC in 1 week post discharge.  Referrals: None  Patient is here for hospital follow-up from diagnosis of acute hypoxic respiratory failure due to influenza A pneumonia, MRSA pneumonia and arts.  Patient has a history of mild asthma previously controlled on Trelegy and albuterol inhaler as needed.  Patient did require intubation twice during her hospitalization and was finally extubated on 01/29/2023.  Patient completed Tamiflu and IV antibiotics for pneumonia and was discharged home on 2 L oxygen via nasal cannula with instructions to wean off as able.  Patient states that she has not had to use her home oxygen at this time.  While hospitalized, patient did have echocardiogram showing LVEF of 50 to 55%.  She does have follow-up with cardiology in March 2025.  Patient was treated with IV diuretics for several days, but was not discharged home on this.  Recommended follow-up of BMP and CBC due to anemia of chronic disease, hypokalemia and hyponatremia which was resolved per chart review.  Patient was recommended for home health PT due to physical deconditioning.  Patient declines referral to home health PT at this time.   She was an active smoker prior to admission, she states that she is not smoking at this time and is using nicotine patches as needed.  Patient reports she is still having a chronic cough throughout the day that is mildly congested and slightly productive with green and yellow sputum.  Patient states that she is having issues with her insomnia at nighttime and is unable to sleep with use of trazodone 100 mg nightly.  She states she is waking up and coughing at certain times.  She reports mild irritation to the throat and states that she was told during hospitalization that "her vocal cord" was nicked.  She was reassured that this would heal on her own.  She is currently able to perform her own ADLs at home and her husband is helping with assistance as needed.  Patient reports mild burning with urination that began in the past week.  Patient did have Foley catheter in place during hospital admission.  She denies frequency, urgency, hematuria, fever, chills, abdominal pain.   The following portions of the patient's history were reviewed and updated as appropriate: past medical history, past surgical history, family history, social history, allergies, medications, and problem list.   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   Abnormal CT of the chest 02/04/2014   DOE (dyspnea on exertion) 12/21/2013   Benzodiazepine  dependence, episodic (HCC) 12/23/2011   DDD (degenerative disc disease), cervical 12/23/2011   DDD (degenerative disc disease), lumbosacral 12/23/2011   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   Chronic kidney disease (CKD), stage III (moderate) (HCC) 05/29/2011   Abdominal pain 02/11/2011   Fibromyalgia 02/11/2011   Sebaceous cyst of breast, right subaereolar 10/30/2010   Past Medical History:  Diagnosis Date   Allergy    Anxiety    Arthritis    Asthma    Bruises easily    Chronic chest wall pain    Chronic pain    Cough    Depression    Diabetes mellitus without complication (HCC)    Dyspnea    chronic   Encounter for therapeutic drug monitoring 02/11/2011   Fibromyalgia    Functional movement disorder    GERD (gastroesophageal reflux disease)    Headache    Hearing loss    High cholesterol    History of hiatal hernia    Hypertension    Hypothyroidism    Kidney disease    stage 3   Leg swelling    both   Nausea 02/11/2011   Nausea and vomiting 04/01/2013   Pain management    PONV (postoperative nausea and vomiting)    SMAS (superior mesenteric artery syndrome) (HCC)    Thyroid disease    TIA (transient ischemic attack)    Tic disorder    TMJ (dislocation of temporomandibular joint)    Transient cerebral ischemia    Type 2 diabetes mellitus with complications (HCC)    Past Surgical History:  Procedure Laterality Date   ABDOMINAL HYSTERECTOMY     AORTA - SUPERIOR MESENTERIC AND AORTA - RENAL ARTERY BYPASS GRAFT     APPENDECTOMY     BREAST CYST EXCISION Right    BREAST EXCISIONAL BIOPSY     CHOLECYSTECTOMY     cyst removed     HERNIA REPAIR     OOPHORECTOMY     bilateral   TEE WITHOUT CARDIOVERSION N/A 06/18/2012   Procedure: TRANSESOPHAGEAL ECHOCARDIOGRAM (TEE);  Surgeon: Thurmon Fair, MD;  Location: [facility] ENDOSCOPY;  Service: Cardiovascular;  Laterality: N/A;   Family History  Problem Relation Age of Onset   Hypertension Sister    Asthma Father     Cancer Paternal Grandmother        breast   Heart disease Paternal Grandmother    Breast cancer Paternal Grandmother    Asthma Paternal Grandmother    Pulmonary fibrosis Maternal Grandmother    Emphysema Maternal Grandmother    Heart disease Maternal Grandmother    Breast cancer Maternal Grandmother    Leukemia Paternal Grandfather    Heart disease Paternal Grandfather    Clotting disorder Paternal Grandfather    Parkinson's disease Maternal Aunt    Asthma Maternal Grandfather    Heart disease Maternal Grandfather    Outpatient Medications Prior to Visit  Medication Sig Dispense Refill   atorvastatin (LIPITOR) 20 MG tablet Take 1 tablet (20 mg total) by mouth at bedtime. 90 tablet 3   buPROPion ER (WELLBUTRIN SR) 100 MG 12 hr tablet Take 1 tablet (100 mg total) by mouth daily. 90 tablet 3   dicyclomine (BENTYL) 10  MG capsule Take 1 capsule (10 mg total) by mouth 4 (four) times daily -  before meals and at bedtime. (Patient taking differently: Take 10 mg by mouth 3 (three) times daily as needed for spasms.) 60 capsule 1   Fluticasone-Umeclidin-Vilant (TRELEGY ELLIPTA) 100-62.5-25 MCG/ACT AEPB Inhale 1 puff into the lungs daily as needed (wheezing/SOB).     gabapentin (NEURONTIN) 600 MG tablet Take 1 tablet (600 mg total) by mouth at bedtime. 60 tablet 3   levothyroxine (SYNTHROID) 100 MCG tablet TAKE 1 TABLET BY MOUTH DAILY BEFORE BREAKFAST. 30 tablet 0   metFORMIN (GLUCOPHAGE-XR) 500 MG 24 hr tablet Take 1 tablet (500 mg total) by mouth daily with breakfast. 90 tablet 3   midodrine (PROAMATINE) 5 MG tablet Take 1 tablet (5 mg total) by mouth 3 (three) times daily with meals. 90 tablet 0   nystatin cream (MYCOSTATIN) Apply 1 Application topically 2 (two) times daily.     pantoprazole (PROTONIX) 40 MG tablet Take 1 tablet (40 mg total) by mouth daily. 30 tablet 0   promethazine (PHENERGAN) 25 MG tablet Take 1 tablet (25 mg total) by mouth every 8 (eight) hours as needed for nausea or  vomiting. 30 tablet 3   propranolol (INDERAL) 80 MG tablet Take 1 tablet (80 mg total) by mouth daily. 90 tablet 3   diazepam (VALIUM) 5 MG tablet Take 1 tablet (5 mg total) by mouth 3 (three) times daily as needed for anxiety. (Patient taking differently: Take 5 mg by mouth 2 (two) times daily as needed for anxiety.) 30 tablet 3   traZODone (DESYREL) 100 MG tablet Take 1 tablet (100 mg total) by mouth at bedtime. 30 tablet 3   No facility-administered medications prior to visit.   Allergies  Allergen Reactions   Latex Anaphylaxis and Swelling   Codeine Nausea And Vomiting    Other reaction(s): GI Upset (intolerance), Vomiting (intolerance)   Ibuprofen Other (See Comments)    GI upset, burning sensation   Oxycodone Nausea And Vomiting    Pre-medication with phenergan needed.   Tape Itching and Rash    Please use "paper" tape only.     ROS: A complete ROS was performed with pertinent positives/negatives noted in the HPI. The remainder of the ROS are negative.    Objective:   Today's Vitals   02/17/23 1035  BP: 123/72  Pulse: 65  Temp: 98.1 F (36.7 C)  TempSrc: Oral  SpO2: 97%  Weight: 132 lb 11.2 oz (60.2 kg)  Height: 5\' 4"  (1.626 m)    Physical Exam          GENERAL: Well-appearing, in NAD. Well nourished.  SKIN: Pink, warm and dry. No rash, lesion, ulceration, or ecchymoses.  Head: Normocephalic. NECK: Trachea midline. Full ROM w/o pain or tenderness.  THROAT: Uvula midline. Oropharynx clear.  Mucous membranes pink and moist.  RESPIRATORY: Chest wall symmetrical. Respirations even and non-labored. Breath sounds slightly diminished with rhonchi to bilateral lung fields.  Cough is congested and mildly productive.  CARDIAC: S1, S2 present, regular rate and rhythm without murmur or gallops. Peripheral pulses 2+ bilaterally.  MSK: Mild muscle atrophy present, generalized weakness present due to prolonged hospitalization.  Patient is able to ambulate up from  wheelchair. EXTREMITIES: Without clubbing, cyanosis, or edema.  NEUROLOGIC: No motor or sensory deficits. Steady, even gait. C2-C12 intact.  PSYCH/MENTAL STATUS: Alert, oriented x 3. Cooperative, appropriate mood and affect.     Assessment & Plan:   1. Hospital discharge follow-up (Primary) 2.  Physical deconditioning Will obtain BMP and CBC with lab work today.  Patient will continue to use 2 L oxygen as needed.  We will wean her from this as needed need over the next 2 weeks.  PCP recommended home health PT referral, patient declined.  I discussed slowly increasing her tolerance to physical activity and patient verbalized understanding.  Patient will return in approximately 2 weeks for follow-up with PCP and will obtain chest x-ray for pneumonia clearance at that time. - Basic metabolic panel - CBC with Differential/Platelet   3. Pneumonia due to methicillin resistant Staphylococcus aureus (MRSA), unspecified laterality, unspecified part of lung (HCC) Recommend patient start Mucinex plain daily for congestion, use Promethazine DM as needed for cough during the day with a safe use reviewed with patient.  Recommend patient also restart her Trelegy inhaler daily and use albuterol inhaler as needed for cough, shortness of breath or wheezing.  I believe that this will help her to clear pneumonia and improve inflammation over the next several weeks.  Discussed use of these medications with her and she verbalized understanding.  Will repeat chest x-ray in approximately 2 weeks.  4. GAD (generalized anxiety disorder) Controlled currently.  Diazepam refilled and safe use of this medication reviewed with patient.  PDMP reviewed. - diazepam (VALIUM) 5 MG tablet; Take 1 tablet (5 mg total) by mouth 2 (two) times daily as needed for anxiety.  Dispense: 30 tablet; Refill: 3  5. Hypokalemia Will obtain BMP with lab work today to evaluate for hypokalemia correction.  Patient currently on no potassium wasting  diuretics. - Basic metabolic panel  6. Anemia, unspecified type Will obtain CBC with lab work today to evaluate for chronic anemia control. - CBC with Differential/Platelet  7. Burning with urination She reported mild burning with urination.  Likely some mild trauma due to Foley catheter placement.  Foley catheter removed prior to discharge and patient is urinating well at this time.  POC urine dipstick unremarkable in office today.  Recommend increasing clear fluids, using Replens as needed and follow-up if no improvement. - POCT URINALYSIS DIP (CLINITEK)   Meds ordered this encounter  Medications   mirtazapine (REMERON) 15 MG tablet    Sig: Take 1 tablet (15 mg total) by mouth at bedtime.    Dispense:  30 tablet    Refill:  0    Supervising Provider:   DE [place], RAYMOND J [[id number]]   promethazine-dextromethorphan (PROMETHAZINE-DM) 6.25-15 MG/5ML syrup    Sig: Take 5 mLs by mouth 4 (four) times daily as needed for cough.    Dispense:  118 mL    Refill:  1    Supervising Provider:   DE [place], RAYMOND J [[id number]]   diazepam (VALIUM) 5 MG tablet    Sig: Take 1 tablet (5 mg total) by mouth 2 (two) times daily as needed for anxiety.    Dispense:  30 tablet    Refill:  3    Supervising Provider:   DE [place], RAYMOND J [[id number]]   Fluticasone-Umeclidin-Vilant (TRELEGY ELLIPTA) 100-62.5-25 MCG/ACT AEPB    Sig: Inhale 1 puff into the lungs daily.    Dispense:  1 each    Refill:  11    Supervising Provider:   DE [place], RAYMOND J [[id number]]   albuterol (VENTOLIN HFA) 108 (90 Base) MCG/ACT inhaler    Sig: Inhale 2 puffs into the lungs every 6 (six) hours as needed for wheezing or shortness of breath.    Dispense:  8 g  Refill:  2    Supervising Provider:   DE [place], RAYMOND J [[id number]]   Lab Orders         Basic metabolic panel         CBC with Differential/Platelet         POCT URINALYSIS DIP (CLINITEK)      Return in about 2 weeks (around 03/03/2023) for Follow up Pneumonia/Asthma.     Patient to reach out to office if new, worrisome, or unresolved symptoms arise or if no improvement in patient's condition. Patient verbalized understanding and is agreeable to treatment plan. All questions answered to patient's satisfaction.    Hilbert Bible, [place]

## 2023-02-18 ENCOUNTER — Other Ambulatory Visit (HOSPITAL_BASED_OUTPATIENT_CLINIC_OR_DEPARTMENT_OTHER): Payer: Self-pay | Admitting: Family Medicine

## 2023-02-18 ENCOUNTER — Encounter (HOSPITAL_BASED_OUTPATIENT_CLINIC_OR_DEPARTMENT_OTHER): Payer: Self-pay | Admitting: Family Medicine

## 2023-02-18 DIAGNOSIS — R7989 Other specified abnormal findings of blood chemistry: Secondary | ICD-10-CM

## 2023-02-18 LAB — CBC WITH DIFFERENTIAL/PLATELET
Basophils Absolute: 0.1 10*3/uL (ref 0.0–0.2)
Basos: 1 %
EOS (ABSOLUTE): 0.5 10*3/uL — ABNORMAL HIGH (ref 0.0–0.4)
Eos: 5 %
Hematocrit: 32.3 % — ABNORMAL LOW (ref 34.0–46.6)
Hemoglobin: 10.4 g/dL — ABNORMAL LOW (ref 11.1–15.9)
Immature Grans (Abs): 0.2 10*3/uL — ABNORMAL HIGH (ref 0.0–0.1)
Immature Granulocytes: 2 %
Lymphocytes Absolute: 2.1 10*3/uL (ref 0.7–3.1)
Lymphs: 23 %
MCH: 30.2 pg (ref 26.6–33.0)
MCHC: 32.2 g/dL (ref 31.5–35.7)
MCV: 94 fL (ref 79–97)
Monocytes Absolute: 0.7 10*3/uL (ref 0.1–0.9)
Monocytes: 8 %
Neutrophils Absolute: 5.7 10*3/uL (ref 1.4–7.0)
Neutrophils: 61 %
Platelets: 583 10*3/uL — ABNORMAL HIGH (ref 150–450)
RBC: 3.44 x10E6/uL — ABNORMAL LOW (ref 3.77–5.28)
RDW: 14.4 % (ref 11.7–15.4)
WBC: 9.3 10*3/uL (ref 3.4–10.8)

## 2023-02-18 LAB — BASIC METABOLIC PANEL
BUN/Creatinine Ratio: 7 — ABNORMAL LOW (ref 9–23)
BUN: 8 mg/dL (ref 6–24)
CO2: 26 mmol/L (ref 20–29)
Calcium: 9.1 mg/dL (ref 8.7–10.2)
Chloride: 101 mmol/L (ref 96–106)
Creatinine, Ser: 1.11 mg/dL — ABNORMAL HIGH (ref 0.57–1.00)
Glucose: 87 mg/dL (ref 70–99)
Potassium: 4.5 mmol/L (ref 3.5–5.2)
Sodium: 140 mmol/L (ref 134–144)
eGFR: 61 mL/min/{1.73_m2} (ref >59)

## 2023-02-18 NOTE — Progress Notes (Signed)
Hi Sabrina Roberson, Your white blood cell count has improved and your hemoglobin, hematocrit and red blood cell count have also improved.  Your platelets are still elevated likely due to inflammation post infection and I imagine these will improve over the next several weeks.  I would like to repeat this count when you return in 2 weeks for your visit.  Additionally please increase your clear fluid intake over the next several weeks and try to drink plenty of water as your kidney function has slightly increased.  I would like to repeat this on Monday, January 27 with a lab visit if that is okay with you.  I will also include my CMA to get you scheduled for this.

## 2023-02-21 NOTE — Telephone Encounter (Signed)
Pt has an appointment 2/4 please advise on medication

## 2023-02-21 NOTE — Telephone Encounter (Signed)
Looked at pt's med list and I am not seeing Trazodone on current list of medications. It looks like the trazodone was discontinued by Jon Gills 02/17/23.  Called and spoke with pt who states she is still waking up about 4-5 times a night. States she is usually in the bed around 8:30-9:00. States when she does wake up during the night each time, it will sometimes take her about 30 minutes before she is able to go back to sleep. Due to pt's sleep schedule recently, she feels like she has not slept at all.   Pt wants to know if any recommendations might be able to be made. Alexis, please advise on this for pt.

## 2023-02-21 NOTE — Telephone Encounter (Signed)
Called and spoke with pt letting her know the info per [employer]. Pt said she would see how things went over the weekend to see if she began to have improvement from the mirtazapine. Pt has a lab appt scheduled 1/27 and said if she had not had any improvement over the weekend and decided she wanted to change meds, she would let the office staff know when she came in for labs.

## 2023-02-24 ENCOUNTER — Other Ambulatory Visit (HOSPITAL_BASED_OUTPATIENT_CLINIC_OR_DEPARTMENT_OTHER): Payer: Medicare Other

## 2023-02-24 ENCOUNTER — Other Ambulatory Visit (HOSPITAL_BASED_OUTPATIENT_CLINIC_OR_DEPARTMENT_OTHER): Payer: Self-pay | Admitting: Family Medicine

## 2023-02-25 LAB — BASIC METABOLIC PANEL
BUN/Creatinine Ratio: 8 — ABNORMAL LOW (ref 9–23)
BUN: 9 mg/dL (ref 6–24)
CO2: 26 mmol/L (ref 20–29)
Calcium: 9.5 mg/dL (ref 8.7–10.2)
Chloride: 102 mmol/L (ref 96–106)
Creatinine, Ser: 1.11 mg/dL — ABNORMAL HIGH (ref 0.57–1.00)
Glucose: 75 mg/dL (ref 70–99)
Potassium: 4.3 mmol/L (ref 3.5–5.2)
Sodium: 141 mmol/L (ref 134–144)
eGFR: 61 mL/min/{1.73_m2} (ref >59)

## 2023-02-28 ENCOUNTER — Encounter (HOSPITAL_BASED_OUTPATIENT_CLINIC_OR_DEPARTMENT_OTHER): Payer: Self-pay | Admitting: Family Medicine

## 2023-02-28 NOTE — Progress Notes (Signed)
Renal function is unchanged from hospital visit.  Will recommend increasing clear fluids and follow-up at visit in approximately 2 weeks and consider repeat of creatinine at that time.

## 2023-03-03 ENCOUNTER — Other Ambulatory Visit (HOSPITAL_BASED_OUTPATIENT_CLINIC_OR_DEPARTMENT_OTHER): Payer: Self-pay | Admitting: Family Medicine

## 2023-03-04 ENCOUNTER — Ambulatory Visit (HOSPITAL_BASED_OUTPATIENT_CLINIC_OR_DEPARTMENT_OTHER): Payer: Medicare Other

## 2023-03-04 ENCOUNTER — Encounter (HOSPITAL_BASED_OUTPATIENT_CLINIC_OR_DEPARTMENT_OTHER): Payer: Self-pay | Admitting: Family Medicine

## 2023-03-04 ENCOUNTER — Ambulatory Visit (INDEPENDENT_AMBULATORY_CARE_PROVIDER_SITE_OTHER): Payer: Medicare Other | Admitting: Family Medicine

## 2023-03-04 VITALS — BP 107/76 | HR 65 | Ht 64.0 in | Wt 137.6 lb

## 2023-03-04 DIAGNOSIS — E039 Hypothyroidism, unspecified: Secondary | ICD-10-CM

## 2023-03-04 DIAGNOSIS — R7989 Other specified abnormal findings of blood chemistry: Secondary | ICD-10-CM | POA: Diagnosis not present

## 2023-03-04 DIAGNOSIS — J15212 Pneumonia due to Methicillin resistant Staphylococcus aureus: Secondary | ICD-10-CM | POA: Diagnosis not present

## 2023-03-04 DIAGNOSIS — E782 Mixed hyperlipidemia: Secondary | ICD-10-CM

## 2023-03-04 MED ORDER — IPRATROPIUM-ALBUTEROL 0.5-2.5 (3) MG/3ML IN SOLN
3.0000 mL | Freq: Once | RESPIRATORY_TRACT | Status: AC
  Administered 2023-03-04: 3 mL via RESPIRATORY_TRACT

## 2023-03-04 MED ORDER — GUAIFENESIN 100 MG/5ML PO LIQD: 5.0000 mL | ORAL | 0 refills | Status: DC | PRN

## 2023-03-04 MED ORDER — PROMETHAZINE-DM 6.25-15 MG/5ML PO SYRP: 5.0000 mL | ORAL_SOLUTION | Freq: Four times a day (QID) | ORAL | 1 refills | Status: DC | PRN

## 2023-03-04 MED ORDER — IPRATROPIUM-ALBUTEROL 0.5-2.5 (3) MG/3ML IN SOLN: 3.0000 mL | RESPIRATORY_TRACT | 2 refills | Status: DC | PRN

## 2023-03-04 NOTE — Progress Notes (Signed)
 Subjective:   Sabrina Roberson 07-02-1970 03/04/2023  Chief Complaint  Patient presents with   Medical Management of Chronic Issues    Patient states she is still having problems with coughing and states she will also become SOB.    HPI: Sabrina Roberson presents today for re-assessment and management of chronic medical conditions.   PNEUMONIA follow-up:  Patient is a 53 year old female with history of hypothyroidism, HTN, HLD, type 2 diabetes is here for follow-up from history of acute hypoxic respiratory failure due to influenza A, MRSA pneumonia and ARDS with hospitalization from January 19, 2023 through February 05, 2023.  She was seen by PCP for hospital follow-up on February 17, 2023 with significant weakness, congestion, and ongoing cough.  She was recommended to use her 2 L of oxygen as needed, recommended home PT referral which patient declined and recommend patient to start Mucinex  daily, Promethazine  DM as needed and start Trelegy inhaler.  Patient recommended to use albuterol  inhaler as needed and return in 2 weeks for repeat chest x-ray for pneumonia clearance. Patient states today she has been using her albuterol  inhaler and Trelegy as directed.  She states she was unable to pick up the Trelegy new inhaler due to cost and has been using a similar inhaler that she had at her home.  She states that she is still having significant fatigue, her cough is significantly congested with thick green and yellow sputum.  She has been using her Promethazine  DM cough syrup and states it only is lasting for a few hours.  She is still having dyspnea with her cough and can feel rattling in her chest.   HYPOTHYROIDISM: Patient presents for the medical management of Hypothyroidism. Current medication: Levothyroxine  100mcg. Patient's medication dosage was decreased in November due to TSH suppression. Pt was to have re-draw but was hospitalized in December-January with pneumo-sepsis.  Patient  compliant with medication regimen: yes  Fatigue: Yes Cold intolerance: no Weight gain/loss: no Constipation: no Lower extremity edema: no Palpitations: no Hoarseness: no Neck Pain/Compression: no Difficulty Swallowing: no  Lab Results  Component Value Date   TSH 0.205 (L) 03/04/2023    HYPERLIPIDEMIA: Sabrina Roberson presents for the medical management of hyperlipidemia.  Patient's current HLD regimen is: Atorvastatin  20mg  nightly Patient is  currently taking prescribed medications for HLD.  Adhering to heathy diet: Yes Exercising regularly: Not currently due to physical deconditioning from hospitalization Denies myalgias.  Lab Results  Component Value Date   CHOL 204 (H) 03/04/2023   HDL 46 03/04/2023   LDLCALC 138 (H) 03/04/2023   TRIG 112 03/04/2023   CHOLHDL 4.4 03/04/2023     The following portions of the patient's history were reviewed and updated as appropriate: past medical history, past surgical history, family history, social history, allergies, medications, and problem list.   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   DOE (dyspnea on exertion) 12/21/2013   Benzodiazepine dependence, episodic (HCC) 12/23/2011   DDD (degenerative disc disease), cervical 12/23/2011   DDD (degenerative disc  disease), lumbosacral 12/23/2011   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   Fibromyalgia 02/11/2011   Past Medical History:  Diagnosis Date   Abdominal pain 02/11/2011   Abnormal CT  of the chest 02/04/2014   CT chest 08/2013:  atx in RML and lingula  CT angio 12/2013:  No PE, RML scarring, abnormal density in lingula  (done at [facility]     Allergy     Anxiety    Arthritis    Asthma    Bruises easily    Chronic chest wall pain    Chronic pain    Cough    Depression    Diabetes mellitus without complication (HCC)    Dyspnea    chronic   Dysuria 09/18/2022   Encounter for therapeutic drug monitoring 02/11/2011   Fibromyalgia    Functional movement disorder    GERD (gastroesophageal reflux disease)    Headache    Hearing loss    High cholesterol    History of hiatal hernia    Hypertension    Hypothyroidism    Kidney disease    stage 3   Leg swelling    both   Nausea 02/11/2011   Nausea and vomiting 04/01/2013   Pain management    PONV (postoperative nausea and vomiting)    Sebaceous cyst of breast, right subaereolar 10/30/2010   Septic shock (HCC) 01/19/2023   SMAS (superior mesenteric artery syndrome) (HCC)    Thyroid  disease    TIA (transient ischemic attack)    Tic disorder    TMJ (dislocation of temporomandibular joint)    Transient cerebral ischemia    Type 2 diabetes mellitus with complications (HCC)    Past Surgical History:  Procedure Laterality Date   ABDOMINAL HYSTERECTOMY     AORTA - SUPERIOR MESENTERIC AND AORTA - RENAL ARTERY BYPASS GRAFT     APPENDECTOMY     BREAST CYST EXCISION Right    BREAST EXCISIONAL BIOPSY     CHOLECYSTECTOMY     cyst removed     HERNIA REPAIR     OOPHORECTOMY     bilateral   TEE WITHOUT CARDIOVERSION N/A 06/18/2012   Procedure: TRANSESOPHAGEAL ECHOCARDIOGRAM (TEE);  Surgeon: Jerel Balding, MD;  Location: [facility] ENDOSCOPY;  Service: Cardiovascular;  Laterality: N/A;   Family History  Problem Relation Age of Onset   Hypertension Sister    Asthma Father    Cancer Paternal Grandmother        breast   Heart disease Paternal Grandmother    Breast cancer Paternal Grandmother    Asthma Paternal  Grandmother    Pulmonary fibrosis Maternal Grandmother    Emphysema Maternal Grandmother    Heart disease Maternal Grandmother    Breast cancer Maternal Grandmother    Leukemia Paternal Grandfather    Heart disease Paternal Grandfather    Clotting disorder Paternal Grandfather    Parkinson's disease Maternal Aunt    Asthma Maternal Grandfather    Heart disease Maternal Grandfather    Outpatient Medications Prior to Visit  Medication Sig Dispense Refill   albuterol  (VENTOLIN  HFA) 108 (90 Base) MCG/ACT inhaler Inhale 2 puffs into the lungs every 6 (six) hours as needed for wheezing or shortness of breath. 8 g 2   atorvastatin  (LIPITOR) 20 MG tablet Take 1 tablet (20 mg total) by mouth at bedtime. 90 tablet 3   buPROPion  ER (WELLBUTRIN  SR) 100 MG 12 hr tablet Take 1 tablet (100 mg total) by mouth daily. 90 tablet 3   diazepam  (VALIUM )  5 MG tablet Take 1 tablet (5 mg total) by mouth 2 (two) times daily as needed for anxiety. 30 tablet 3   dicyclomine  (BENTYL ) 10 MG capsule Take 1 capsule (10 mg total) by mouth 4 (four) times daily -  before meals and at bedtime. (Patient taking differently: Take 10 mg by mouth 3 (three) times daily as needed for spasms.) 60 capsule 1   gabapentin  (NEURONTIN ) 600 MG tablet Take 1 tablet (600 mg total) by mouth at bedtime. 60 tablet 3   metFORMIN  (GLUCOPHAGE -XR) 500 MG 24 hr tablet Take 1 tablet (500 mg total) by mouth daily with breakfast. 90 tablet 3   midodrine  (PROAMATINE ) 5 MG tablet Take 1 tablet (5 mg total) by mouth 3 (three) times daily with meals. 90 tablet 0   mirtazapine  (REMERON ) 15 MG tablet Take 1 tablet (15 mg total) by mouth at bedtime. 30 tablet 0   nystatin cream (MYCOSTATIN) Apply 1 Application topically 2 (two) times daily.     pantoprazole  (PROTONIX ) 40 MG tablet Take 1 tablet (40 mg total) by mouth daily. 30 tablet 0   promethazine  (PHENERGAN ) 25 MG tablet Take 1 tablet (25 mg total) by mouth every 8 (eight) hours as needed for nausea or  vomiting. 30 tablet 3   propranolol  (INDERAL ) 80 MG tablet Take 1 tablet (80 mg total) by mouth daily. 90 tablet 3   Fluticasone-Umeclidin-Vilant (TRELEGY ELLIPTA ) 100-62.5-25 MCG/ACT AEPB Inhale 1 puff into the lungs daily as needed (wheezing/SOB).     Fluticasone-Umeclidin-Vilant (TRELEGY ELLIPTA ) 100-62.5-25 MCG/ACT AEPB Inhale 1 puff into the lungs daily. 1 each 11   levothyroxine  (SYNTHROID ) 100 MCG tablet TAKE 1 TABLET BY MOUTH DAILY BEFORE BREAKFAST. 30 tablet 0   promethazine -dextromethorphan (PROMETHAZINE -DM) 6.25-15 MG/5ML syrup Take 5 mLs by mouth 4 (four) times daily as needed for cough. 118 mL 1   No facility-administered medications prior to visit.   Allergies  Allergen Reactions   Latex Anaphylaxis and Swelling   Codeine Nausea And Vomiting    Other reaction(s): GI Upset (intolerance), Vomiting (intolerance)   Ibuprofen Other (See Comments)    GI upset, burning sensation   Oxycodone  Nausea And Vomiting    Pre-medication with phenergan  needed.   Tape Itching and Rash    Please use paper tape only.     ROS: A complete ROS was performed with pertinent positives/negatives noted in the HPI. The remainder of the ROS are negative.    Objective:   Today's Vitals   03/04/23 1335  BP: 107/76  Pulse: 65  SpO2: 97%  Weight: 137 lb 9.6 oz (62.4 kg)  Height: 5' 4 (1.626 m)    Physical Exam          GENERAL: Fatigued, mildly ill appearing, in NAD. Well nourished.  SKIN: Pink, warm and dry. No rash, lesion, ulceration, or ecchymoses.  Head: Normocephalic. NECK: Trachea midline. Full ROM w/o pain or tenderness. THROAT: Uvula midline. Oropharynx clear.  Mucous membranes pink and moist.  RESPIRATORY: Chest wall symmetrical. Respirations even , mildly labored with exertion.  Significant rhonchi, Rales, and expiratory wheezing present to bilateral lung fields.  Significant vibration present with palpation of lower lung bases on patient's back due to mucous and congested  cough.  Dullness present bilaterally to lower lung pieces with percussion CARDIAC: S1, S2 present, regular rate and rhythm without murmur or gallops. Peripheral pulses 2+ bilaterally.  MSK: Muscle tone and strength appropriate for age. NEUROLOGIC: No motor or sensory deficits. Steady, even gait. C2-C12 intact.  PSYCH/MENTAL  STATUS: Alert, oriented x 3. Cooperative, appropriate mood and affect.     Results for orders placed or performed in visit on 03/04/23  CBC with Differential  Result Value Ref Range   WBC 9.5 3.4 - 10.8 x10E3/uL   RBC 3.65 (L) 3.77 - 5.28 x10E6/uL   Hemoglobin 10.8 (L) 11.1 - 15.9 g/dL   Hematocrit 65.9 65.9 - 46.6 %   MCV 93 79 - 97 fL   MCH 29.6 26.6 - 33.0 pg   MCHC 31.8 31.5 - 35.7 g/dL   RDW 85.4 88.2 - 84.5 %   Platelets 363 150 - 450 x10E3/uL   Neutrophils 68 Not Estab. %   Lymphs 24 Not Estab. %   Monocytes 5 Not Estab. %   Eos 2 Not Estab. %   Basos 0 Not Estab. %   Neutrophils Absolute 6.5 1.4 - 7.0 x10E3/uL   Lymphocytes Absolute 2.2 0.7 - 3.1 x10E3/uL   Monocytes Absolute 0.5 0.1 - 0.9 x10E3/uL   EOS (ABSOLUTE) 0.2 0.0 - 0.4 x10E3/uL   Basophils Absolute 0.0 0.0 - 0.2 x10E3/uL   Immature Granulocytes 1 Not Estab. %   Immature Grans (Abs) 0.1 0.0 - 0.1 x10E3/uL  Basic metabolic panel  Result Value Ref Range   Glucose 80 70 - 99 mg/dL   BUN 9 6 - 24 mg/dL   Creatinine, Ser 9.01 0.57 - 1.00 mg/dL   eGFR 70 >40 fO/fpw/8.26   BUN/Creatinine Ratio 9 9 - 23   Sodium 142 134 - 144 mmol/L   Potassium 4.6 3.5 - 5.2 mmol/L   Chloride 104 96 - 106 mmol/L   CO2 24 20 - 29 mmol/L   Calcium  9.0 8.7 - 10.2 mg/dL  TSH Rfx on Abnormal to Free T4  Result Value Ref Range   TSH 0.205 (L) 0.450 - 4.500 uIU/mL  Lipid panel  Result Value Ref Range   Cholesterol, Total 204 (H) 100 - 199 mg/dL   Triglycerides 887 0 - 149 mg/dL   HDL 46 >60 mg/dL   VLDL Cholesterol Cal 20 5 - 40 mg/dL   LDL Chol Calc (NIH) 861 (H) 0 - 99 mg/dL   Chol/HDL Ratio 4.4 0.0 - 4.4  ratio  T4F  Result Value Ref Range   T4,Free (Direct) 1.49 0.82 - 1.77 ng/dL    The 89-bzjm ASCVD risk score (Arnett DK, et al., 2019) is: 8.1%     Assessment & Plan:  1. Pneumonia due to methicillin resistant Staphylococcus aureus (MRSA), unspecified laterality, unspecified part of lung (HCC) (Primary) Concern for possible remaining infection given patient's exam and ongoing symptoms without improvement.  Will obtain chest x-ray today for resolution surveillance of pneumonia.  Patient given DuoNeb in office and did have mild to moderate improvement of expiratory wheezing and rhonchi.  Consulted with Dr. Slater Staff with pulmonology as patient was seen by her while hospitalized at [facility].  Will send in DuoNebs for patient to use every 4-6 hours for congestion, wheezing, and coughing.  Patient unable to afford Trelegy inhaler, Breztri sample given to patient to use twice a day.  Will start guaifenesin  liquid cough syrup every 4 hours and continue to use Promethazine  DM at nighttime.  Will start prednisone  taper per pulmonology recommendation with Dr. Staff and patient has been scheduled for a follow-up with pulmonology within the next 1 to 2 weeks.  Will repeat patient CBC and BMP given decreased creatinine and inflammatory increase of platelet count.  I recommend patient follow-up with PCP in  2 weeks or sooner if needed.  - DG Chest 2 View; Future - ipratropium-albuterol  (DUONEB) 0.5-2.5 (3) MG/3ML nebulizer solution 3 mL - CBC with Differential - Basic metabolic panel - guaiFENesin  (ROBITUSSIN) 100 MG/5ML liquid; Take 5 mLs by mouth every 4 (four) hours as needed for cough or to loosen phlegm.  Dispense: 120 mL; Refill: 0 - Ambulatory referral to Pulmonology - promethazine -dextromethorphan (PROMETHAZINE -DM) 6.25-15 MG/5ML syrup; Take 5 mLs by mouth 4 (four) times daily as needed for cough.  Dispense: 240 mL; Refill: 1 - CT CHEST WO CONTRAST; Future - predniSONE  (DELTASONE ) 10 MG tablet;  Take 4 tablets (40 mg total) by mouth daily for 2 days, THEN 3 tablets (30 mg total) daily for 2 days, THEN 2 tablets (20 mg total) daily for 2 days, THEN 1 tablet (10 mg total) daily for 2 days.  Dispense: 20 tablet; Refill: 0  2. Elevated serum creatinine Will repeat BMP today as patient has been increasing clear fluids to evaluate for improvement of renal function.  3. Acquired hypothyroidism Obtain TSH and titrate levothyroxine  as needed pending result. - TSH Rfx on Abnormal to Free T4 - levothyroxine  (SYNTHROID ) 88 MCG tablet; Take 1 tablet (88 mcg total) by mouth daily before breakfast.  Dispense: 30 tablet; Refill: 3  4. Mixed hyperlipidemia Will obtain fasting lipid panel and titrate medication pending. - Lipid panel   Meds ordered this encounter  Medications   ipratropium-albuterol  (DUONEB) 0.5-2.5 (3) MG/3ML nebulizer solution 3 mL   ipratropium-albuterol  (DUONEB) 0.5-2.5 (3) MG/3ML SOLN    Sig: Take 3 mLs by nebulization every 4 (four) hours as needed (shortness of breath, wheezing, or cough).    Dispense:  360 mL    Refill:  2    Supervising Provider:   DE CUBA, RAYMOND J [[id number]]   guaiFENesin  (ROBITUSSIN) 100 MG/5ML liquid    Sig: Take 5 mLs by mouth every 4 (four) hours as needed for cough or to loosen phlegm.    Dispense:  120 mL    Refill:  0    Supervising Provider:   DE CUBA, RAYMOND J [[id number]]   promethazine -dextromethorphan (PROMETHAZINE -DM) 6.25-15 MG/5ML syrup    Sig: Take 5 mLs by mouth 4 (four) times daily as needed for cough.    Dispense:  240 mL    Refill:  1    Supervising Provider:   DE CUBA, RAYMOND J [[id number]]   predniSONE  (DELTASONE ) 10 MG tablet    Sig: Take 4 tablets (40 mg total) by mouth daily for 2 days, THEN 3 tablets (30 mg total) daily for 2 days, THEN 2 tablets (20 mg total) daily for 2 days, THEN 1 tablet (10 mg total) daily for 2 days.    Dispense:  20 tablet    Refill:  0    Supervising Provider:   DE CUBA, RAYMOND J [[id number]]    levothyroxine  (SYNTHROID ) 88 MCG tablet    Sig: Take 1 tablet (88 mcg total) by mouth daily before breakfast.    Dispense:  30 tablet    Refill:  3    Supervising Provider:   DE CUBA, RAYMOND J [[id number]]   Lab Orders         CBC with Differential         Basic metabolic panel         TSH Rfx on Abnormal to Free T4         Lipid panel         T4F  Return in about 2 weeks (around 03/18/2023) for FollowFollow up Pneumonia .    Patient to reach out to office if new, worrisome, or unresolved symptoms arise or if no improvement in patient's condition. Patient verbalized understanding and is agreeable to treatment plan. All questions answered to patient's satisfaction.    Thersia Schuyler Stark, [place]

## 2023-03-04 NOTE — Patient Instructions (Signed)
Get your labs today and then go to the 2nd floor for xray.   Guaifenisin- Use during the day   Promethazine- at nighttime for cough    Duoneb (Ipratropium-Albuterol)- Use up to 4x per day with shortness of breath, cough, and congestion

## 2023-03-05 LAB — CBC WITH DIFFERENTIAL/PLATELET
Basophils Absolute: 0 10*3/uL (ref 0.0–0.2)
Basos: 0 %
EOS (ABSOLUTE): 0.2 10*3/uL (ref 0.0–0.4)
Eos: 2 %
Hematocrit: 34 % (ref 34.0–46.6)
Hemoglobin: 10.8 g/dL — ABNORMAL LOW (ref 11.1–15.9)
Immature Grans (Abs): 0.1 10*3/uL (ref 0.0–0.1)
Immature Granulocytes: 1 %
Lymphocytes Absolute: 2.2 10*3/uL (ref 0.7–3.1)
Lymphs: 24 %
MCH: 29.6 pg (ref 26.6–33.0)
MCHC: 31.8 g/dL (ref 31.5–35.7)
MCV: 93 fL (ref 79–97)
Monocytes Absolute: 0.5 10*3/uL (ref 0.1–0.9)
Monocytes: 5 %
Neutrophils Absolute: 6.5 10*3/uL (ref 1.4–7.0)
Neutrophils: 68 %
Platelets: 363 10*3/uL (ref 150–450)
RBC: 3.65 x10E6/uL — ABNORMAL LOW (ref 3.77–5.28)
RDW: 14.5 % (ref 11.7–15.4)
WBC: 9.5 10*3/uL (ref 3.4–10.8)

## 2023-03-05 LAB — BASIC METABOLIC PANEL
BUN/Creatinine Ratio: 9 (ref 9–23)
BUN: 9 mg/dL (ref 6–24)
CO2: 24 mmol/L (ref 20–29)
Calcium: 9 mg/dL (ref 8.7–10.2)
Chloride: 104 mmol/L (ref 96–106)
Creatinine, Ser: 0.98 mg/dL (ref 0.57–1.00)
Glucose: 80 mg/dL (ref 70–99)
Potassium: 4.6 mmol/L (ref 3.5–5.2)
Sodium: 142 mmol/L (ref 134–144)
eGFR: 70 mL/min/{1.73_m2} (ref >59)

## 2023-03-05 LAB — LIPID PANEL
Chol/HDL Ratio: 4.4 {ratio} (ref 0.0–4.4)
Cholesterol, Total: 204 mg/dL — ABNORMAL HIGH (ref 100–199)
HDL: 46 mg/dL (ref >39)
LDL Chol Calc (NIH): 138 mg/dL — ABNORMAL HIGH (ref 0–99)
Triglycerides: 112 mg/dL (ref 0–149)
VLDL Cholesterol Cal: 20 mg/dL (ref 5–40)

## 2023-03-05 LAB — T4F: T4,Free (Direct): 1.49 ng/dL (ref 0.82–1.77)

## 2023-03-05 LAB — TSH RFX ON ABNORMAL TO FREE T4: TSH: 0.205 u[IU]/mL — ABNORMAL LOW (ref 0.450–4.500)

## 2023-03-05 MED ORDER — LEVOTHYROXINE SODIUM 88 MCG PO TABS: 88.0000 ug | ORAL_TABLET | Freq: Every day | ORAL | 3 refills | Status: DC

## 2023-03-05 MED ORDER — PREDNISONE 10 MG PO TABS: ORAL_TABLET | ORAL | 0 refills | Status: AC

## 2023-03-05 NOTE — Progress Notes (Signed)
 Anemia improving from hospitalization.  Platelets are back to baseline.  Renal function is back to baseline.  Cholesterol is stable, will recommend possible increase in atorvastatin  post ongoing infection at this time with pneumonia.  Will decrease patient's levothyroxine  from 100 mcg to 88 mcg for hypothryoid treatment and recheck in 6 weeks.

## 2023-03-05 NOTE — Progress Notes (Signed)
 Spoke with patient regarding her recent chest x-ray results and lab work.  Dr. Kassie with pulmonology was also consulted as she had seen the patient while hospitalized and patient needing urgent referral for follow-up for pulmonology.  She is working on getting the patient scheduled and recommended starting her on a steroid taper, continuing mucolytic therapy and continuing nebulizer treatments.  We will proceed with CT chest without contrast as recommended by radiology due to patient's exam, and lack of improvement in her symptoms.  She did recommend starting Trelegy or increasing, patient has been unable to afford this medication.  Sample of Breztri will be provided to patient.  Recommend patient use Breztri 2 puffs twice a day, followed by rinse and spit to prevent thrush.  She will continue using her nebulizations as directed per office visit and follow-up in 2 weeks with PCP.  Discussed healthy lifestyle, dietary changes with improvement in her cholesterol.  Continue on atorvastatin  as directed.  We will decrease her dosage of levothyroxine  to 88 mcg and recheck her TSH in 6 weeks.  Renal function back to baseline, platelet function back to baseline

## 2023-03-10 ENCOUNTER — Ambulatory Visit (HOSPITAL_BASED_OUTPATIENT_CLINIC_OR_DEPARTMENT_OTHER)
Admission: RE | Admit: 2023-03-10 | Discharge: 2023-03-10 | Disposition: A | Discharge status: Home / Self Care | Payer: Medicare Other | Source: Ambulatory Visit | Attending: Family Medicine | Admitting: Family Medicine

## 2023-03-10 DIAGNOSIS — J15212 Pneumonia due to Methicillin resistant Staphylococcus aureus: Secondary | ICD-10-CM | POA: Diagnosis present

## 2023-03-11 ENCOUNTER — Encounter (HOSPITAL_BASED_OUTPATIENT_CLINIC_OR_DEPARTMENT_OTHER): Payer: Self-pay | Admitting: Family Medicine

## 2023-03-11 NOTE — Progress Notes (Signed)
Hi Sabrina Roberson, Your CT chest looks like there is significant improvement in the pneumonia bilaterally.  There is still presence of bronchial inflammation and opacities.  Radiology believes that this is consistent with resolving multifocal pneumonia.  I would recommend we continue with the current treatment plan as discussed and follow-up with pulmonology as you are scheduled next week.  I do not believe we need to start any further antibiotic therapy at this time.  I believe that the steroid, inhaler, and nebulizer treatments will help.  If you have any further questions please let me know

## 2023-03-17 ENCOUNTER — Ambulatory Visit: Payer: Medicare Other | Admitting: Nurse Practitioner

## 2023-03-17 ENCOUNTER — Encounter: Payer: Self-pay | Admitting: Nurse Practitioner

## 2023-03-17 VITALS — BP 118/72 | HR 67 | Temp 98.3°F

## 2023-03-17 DIAGNOSIS — J09X2 Influenza due to identified novel influenza A virus with other respiratory manifestations: Secondary | ICD-10-CM

## 2023-03-17 DIAGNOSIS — J189 Pneumonia, unspecified organism: Secondary | ICD-10-CM

## 2023-03-17 DIAGNOSIS — J15212 Pneumonia due to Methicillin resistant Staphylococcus aureus: Secondary | ICD-10-CM

## 2023-03-17 DIAGNOSIS — E039 Hypothyroidism, unspecified: Secondary | ICD-10-CM | POA: Diagnosis not present

## 2023-03-17 DIAGNOSIS — R058 Other specified cough: Secondary | ICD-10-CM

## 2023-03-17 DIAGNOSIS — J209 Acute bronchitis, unspecified: Secondary | ICD-10-CM

## 2023-03-17 MED ORDER — ALBUTEROL SULFATE (2.5 MG/3ML) 0.083% IN NEBU
2.5000 mg | INHALATION_SOLUTION | Freq: Once | RESPIRATORY_TRACT | Status: AC
  Administered 2023-03-17: 2.5 mg via RESPIRATORY_TRACT

## 2023-03-17 MED ORDER — HYDROCOD POLI-CHLORPHE POLI ER 10-8 MG/5ML PO SUER
5.0000 mL | Freq: Two times a day (BID) | ORAL | 0 refills | Status: DC | PRN
Start: 2023-03-17 — End: 2023-04-03

## 2023-03-17 MED ORDER — PREDNISONE 10 MG PO TABS
ORAL_TABLET | ORAL | 0 refills | Status: DC
Start: 2023-03-17 — End: 2023-04-03

## 2023-03-17 MED ORDER — SODIUM CHLORIDE 3 % IN NEBU: INHALATION_SOLUTION | RESPIRATORY_TRACT | 5 refills | Status: DC | PRN

## 2023-03-17 MED ORDER — IPRATROPIUM-ALBUTEROL 0.5-2.5 (3) MG/3ML IN SOLN: 3.0000 mL | Freq: Once | RESPIRATORY_TRACT | Status: DC

## 2023-03-17 NOTE — Patient Instructions (Addendum)
-  Continue Albuterol inhaler 2 puffs every 6 hours as needed for shortness of breath or wheezing. Notify if symptoms persist despite rescue inhaler/neb use.  -Use duoneb nebulizer treatment 3 mL 2-3 times a day. Follow with hypertonic saline neb 3 mL in the morning and evening (twice a day) then follow with flutter valve 10 times  -Prednisone 20 mg daily for 5 days then 10 mg daily for 5 days. Take in AM with food -Guaifenesin (plain mucinex) [phone] mg Twice daily over the counter for cough/congestion -Tussionex 5 mL every 12 hours as needed for cough. Do not drive after taking. May cause drowsiness.  -Use oxygen 2 lpm as needed for goal >88-90%  If you collect a better sputum culture, return to [facility] office in the next couple days. I will send the one we got today but it may have too much saliva in it  If you develop worsening symptoms, start coughing up more blood, new fevers, decreased appetite, etc, go to the emergency department  Follow up in 7-10 days with Dr. Everardo All (new pt slot) or Katie Liese Dizdarevic,NP. Ok to [facility] on day not double booked. If symptoms do not improve or worsen, please contact office for sooner follow up or seek emergency care.

## 2023-03-17 NOTE — Progress Notes (Unsigned)
@Patient  ID: Sabrina Roberson, female    DOB: [birth year], 53 y.o.   MRN: [id number]  Chief Complaint  Patient presents with   Hospitalization Follow-up    Breathing has improved some since hospital d/c. She is coughing some- occ thick, green to yellow sputum.  She is wheezing some throughout the day.     Referring provider: Hilbert Bible, *  HPI: 53 year old female, former smoker recently hospitalized for ARDS secondary to MRSA pneumonia and Influenza A.  Past medical history significant for hypertension, superior mesenteric artery syndrome, hypothyroid, DM2, CKD, fibromyalgia, anxiety, insomnia, benzodiazepine dependence.  She was recently admitted from 01/19/2023 to 02/05/2023 for ARDS and septic shock in setting of influenza A and MRSA pneumonia.  She was intubated on admission and extubated on 01/23/2023.  Unfortunately had to be reintubated on 01/25/2023 for increasing respiratory distress.  She was treated with empiric broad-spectrum antibiotics and transition to linezolid for 7 days upon positive tracheal aspirate for MRSA.  Successfully extubated on 01/29/2023 and weaned off vasopressors.  She had significant improvement and was discharged on 2 L/min supplemental O2.  She completed her antibiotic course and Tamiflu during her hospital admission.  TEST/EVENTS:  12/28/2013 PFT: FVC 93, FEV1 93, ratio 81 01/19/2023 CT chest: Extensive bilateral airspace opacitie throughout both lungs 01/20/2023 echo: EF 50 to 55%.  RV size and function normal.  No valvular disease 03/10/2023 CT chest without contrast: No LAD.  Significant improvement in bilateral airspace opacities.  Residual airspace opacities greatest in right upper lobe and lingula.  Associated volume loss.  There is also traction bronchiectasis and scattered groundglass in both lungs.  03/17/2023: Today-Hospital follow-up Discussed the use of AI scribe software for clinical note transcription with the patient, who gave verbal  consent to proceed.  History of Present Illness   Sabrina Roberson is a 53 year old female who presents with persistent respiratory symptoms following a recent hospitalization for pneumonia.  She was hospitalized from December 22nd to January 8th. Admission summary above. Upon discharge, she was discharged home on supplemental oxygen 2 lpm, which she is not using at home. No low O2 levels noted at home or during recent OV with PCP. She was seen 1/20 by her PCP for HFU with persistent congested cough producing yellow to green phlegm. She was prescribed Trelegy, albuterol HFA and promethazine DM cough syrup. They had also recommended PT; however, patient declined. She went back to her PCP on 03/04/2023 for follow up. She reported persistent fatigue and productive cough with green to yellow phlegm. CXR with concern for persistent pna - CT chest was ordered for further evaluation. She was also prescribed steroids but only took one dose due to side effects, including feeling jittery and having difficulty sleeping.   She had her CT chest 03/10/2023. Images reviewed. She has had significant improvement when compared to prior CT imaging. She does have some residual opacities and ground glass throughout both lungs, right > left.   She has a persistent productive cough with green and yellow sputum, unchanged in color or consistency since her hospital stay. She reports no significant improvement in the cough, stating, 'I don't think it ever went away.' The cough is bothersome and current medicine doesn't seem to be doing much. She has noticed occasional blood tinged sputum since her hospital discharge, which was not present during her stay. Her breathing is improving, but she still experiences fatigue and requires frequent rest after exertion. She had no cough or breathing  difficulties prior to this illness. She denies fevers, chills, anorexia, night sweats. No mass hemoptysis. She is eating and drinking well. No low O2  levels at home.   She uses albuterol a few times a day and takes a cough syrup containing promethazine DM, which she doesn't feel like is doing much for her. She has used Mucinex pills in the past but is not currently taking them. She is occasionally using her nebs. She doesn't recall using a maintenance inhaler; although prior PCP note said she was using Trelegy then given Breztri. Will have her bring her inhalers at her follow up.   Her thyroid medication, levothyroxine, was recently decreased due to low thyroid levels.       Allergies  Allergen Reactions   Latex Anaphylaxis and Swelling   Codeine Nausea And Vomiting    Other reaction(s): GI Upset (intolerance), Vomiting (intolerance)   Ibuprofen Other (See Comments)    GI upset, burning sensation   Oxycodone Nausea And Vomiting    Pre-medication with phenergan needed.   Tape Itching and Rash    Please use "paper" tape only.    Immunization History  Administered Date(s) Administered   DTP 09/30/1972, 02/23/1973, 09/17/1974, 04/29/1977   MMR 09/22/1973   OPV 09/22/1972, 09/30/1972, 02/23/1973, 04/29/1977   Td 03/06/1993   Tdap 10/09/2014    Past Medical History:  Diagnosis Date   Abdominal pain 02/11/2011   Abnormal CT of the chest 02/04/2014   CT chest 08/2013:  atx in RML and lingula  CT angio 12/2013:  No PE, RML scarring, abnormal density in lingula  (done at [facility])     Allergy    Anxiety    Arthritis    Asthma    Bruises easily    Chronic chest wall pain    Chronic pain    Cough    Depression    Diabetes mellitus without complication (HCC)    Dyspnea    chronic   Dysuria 09/18/2022   Encounter for therapeutic drug monitoring 02/11/2011   Fibromyalgia    Functional movement disorder    GERD (gastroesophageal reflux disease)    Headache    Hearing loss    High cholesterol    History of hiatal hernia    Hypertension    Hypothyroidism    Kidney disease    stage 3   Leg swelling    both   Nausea  02/11/2011   Nausea and vomiting 04/01/2013   Pain management    PONV (postoperative nausea and vomiting)    Sebaceous cyst of breast, right subaereolar 10/30/2010   Septic shock (HCC) 01/19/2023   SMAS (superior mesenteric artery syndrome) (HCC)    Thyroid disease    TIA (transient ischemic attack)    Tic disorder    TMJ (dislocation of temporomandibular joint)    Transient cerebral ischemia    Type 2 diabetes mellitus with complications (HCC)     Tobacco History: Social History   Tobacco Use  Smoking Status Former   Current packs/day: 0.00   Average packs/day: 0.5 packs/day for 15.0 years (7.5 ttl pk-yrs)   Types: Cigarettes   Quit date: 01/19/2023   Years since quitting: 0.1  Smokeless Tobacco Never   Counseling given: Not Answered   Outpatient Medications Prior to Visit  Medication Sig Dispense Refill   albuterol (VENTOLIN HFA) 108 (90 Base) MCG/ACT inhaler Inhale 2 puffs into the lungs every 6 (six) hours as needed for wheezing or shortness of breath. 8 g 2  atorvastatin (LIPITOR) 20 MG tablet Take 1 tablet (20 mg total) by mouth at bedtime. 90 tablet 3   buPROPion ER (WELLBUTRIN SR) 100 MG 12 hr tablet Take 1 tablet (100 mg total) by mouth daily. 90 tablet 3   diazepam (VALIUM) 5 MG tablet Take 1 tablet (5 mg total) by mouth 2 (two) times daily as needed for anxiety. 30 tablet 3   dicyclomine (BENTYL) 10 MG capsule Take 1 capsule (10 mg total) by mouth 4 (four) times daily -  before meals and at bedtime. (Patient taking differently: Take 10 mg by mouth 3 (three) times daily as needed for spasms.) 60 capsule 1   gabapentin (NEURONTIN) 600 MG tablet Take 1 tablet (600 mg total) by mouth at bedtime. 60 tablet 3   guaiFENesin (ROBITUSSIN) 100 MG/5ML liquid Take 5 mLs by mouth every 4 (four) hours as needed for cough or to loosen phlegm. 120 mL 0   ipratropium-albuterol (DUONEB) 0.5-2.5 (3) MG/3ML SOLN Take 3 mLs by nebulization every 4 (four) hours as needed (shortness of  breath, wheezing, or cough). 360 mL 2   levothyroxine (SYNTHROID) 88 MCG tablet Take 1 tablet (88 mcg total) by mouth daily before breakfast. 30 tablet 3   metFORMIN (GLUCOPHAGE-XR) 500 MG 24 hr tablet Take 1 tablet (500 mg total) by mouth daily with breakfast. 90 tablet 3   mirtazapine (REMERON) 15 MG tablet Take 1 tablet (15 mg total) by mouth at bedtime. 30 tablet 0   nystatin cream (MYCOSTATIN) Apply 1 Application topically 2 (two) times daily.     promethazine (PHENERGAN) 25 MG tablet Take 1 tablet (25 mg total) by mouth every 8 (eight) hours as needed for nausea or vomiting. 30 tablet 3   promethazine-dextromethorphan (PROMETHAZINE-DM) 6.25-15 MG/5ML syrup Take 5 mLs by mouth 4 (four) times daily as needed for cough. 240 mL 1   propranolol (INDERAL) 80 MG tablet Take 1 tablet (80 mg total) by mouth daily. 90 tablet 3   pantoprazole (PROTONIX) 40 MG tablet Take 1 tablet (40 mg total) by mouth daily. 30 tablet 0   No facility-administered medications prior to visit.     Review of Systems:   Constitutional: No weight loss or gain, night sweats, fevers, chills +improving fatigue, lassitude. HEENT: No headaches, difficulty swallowing, tooth/dental problems, or sore throat. No sneezing, itching, ear ache, nasal congestion, or post nasal drip CV:  No chest pain, orthopnea, PND, swelling in lower extremities, anasarca, dizziness, palpitations, syncope Resp: +shortness of breath with exertion (improving); productive cough; blood tinged sputum. No wheezing.  No chest wall deformity GI:  No heartburn, indigestion, abdominal pain, nausea, vomiting, diarrhea, change in bowel habits, loss of appetite, bloody stools.  GU: No dysuria, change in color of urine, urgency or frequency.  No flank pain, no hematuria  Skin: No rash, lesions, ulcerations MSK:  No joint pain or swelling.  No decreased range of motion.  No back pain. Neuro: No dizziness or lightheadedness.  Psych: No depression or anxiety. Mood  stable.     Physical Exam:  BP 118/72 (BP Location: Right Arm, Cuff Size: Normal)   Pulse 67   Temp 98.3 F (36.8 C) (Oral)   SpO2 98%   GEN: Pleasant, interactive, well-appearing; in no acute distress HEENT:  Normocephalic and atraumatic. PERRLA. Sclera white. Nasal turbinates pink, moist and patent bilaterally. No rhinorrhea present. Oropharynx pink and moist, without exudate or edema. No lesions, ulcerations, or postnasal drip.  NECK:  Supple w/ fair ROM. No JVD present. Normal  carotid impulses w/o bruits. Thyroid symmetrical with no goiter or nodules palpated. No lymphadenopathy.   CV: RRR, no m/r/g, no peripheral edema. Pulses intact, +2 bilaterally. No cyanosis, pallor or clubbing. PULMONARY:  Unlabored, regular breathing. Rhonchi b/l lower lobe and RML otherwise clear A&P. Congested cough. No accessory muscle use.  GI: BS present and normoactive. Soft, non-tender to palpation. No organomegaly or masses detected.  MSK: No erythema, warmth or tenderness. Cap refil <2 sec all extrem. No deformities or joint swelling noted.  Neuro: A/Ox3. No focal deficits noted.   Skin: Warm, no lesions or rashe Psych: Normal affect and behavior. Judgement and thought content appropriate.     Lab Results:  CBC    Component Value Date/Time   WBC 9.5 03/04/2023 1506   WBC 9.3 02/05/2023 0323   RBC 3.65 (L) 03/04/2023 1506   RBC 2.74 (L) 02/05/2023 0323   HGB 10.8 (L) 03/04/2023 1506   HCT 34.0 03/04/2023 1506   PLT 363 03/04/2023 1506   MCV 93 03/04/2023 1506   MCH 29.6 03/04/2023 1506   MCH 29.2 02/05/2023 0323   MCHC 31.8 03/04/2023 1506   MCHC 30.2 02/05/2023 0323   RDW 14.5 03/04/2023 1506   LYMPHSABS 2.2 03/04/2023 1506   MONOABS 0.4 01/24/2023 0550   EOSABS 0.2 03/04/2023 1506   BASOSABS 0.0 03/04/2023 1506    BMET    Component Value Date/Time   NA 142 03/04/2023 1506   K 4.6 03/04/2023 1506   CL 104 03/04/2023 1506   CO2 24 03/04/2023 1506   GLUCOSE 80 03/04/2023 1506    GLUCOSE 89 02/05/2023 0323   BUN 9 03/04/2023 1506   CREATININE 0.98 03/04/2023 1506   CALCIUM 9.0 03/04/2023 1506   GFRNONAA >60 02/05/2023 0323   GFRAA >60 12/18/2016 1444    BNP    Component Value Date/Time   BNP 268.5 (H) 01/20/2023 1030     Imaging:  CT CHEST WO CONTRAST Result Date: 03/10/2023 CLINICAL DATA:  Respiratory illness, nondiagnostic x-ray. Multifocal pneumonia. EXAM: CT CHEST WITHOUT CONTRAST TECHNIQUE: Multidetector CT imaging of the chest was performed following the standard protocol without IV contrast. RADIATION DOSE REDUCTION: This exam was performed according to the departmental dose-optimization program which includes automated exposure control, adjustment of the mA and/or kV according to patient size and/or use of iterative reconstruction technique. COMPARISON:  Radiographs 03/04/2023 and 01/29/2023. Chest CT 01/19/2023. Abdominal CT 10/03/2022. FINDINGS: Cardiovascular: No acute vascular findings on noncontrast imaging. Mild aortic atherosclerosis noted. The heart size is stable at the upper limits of normal. No significant pericardial fluid. Mediastinum/Nodes: There are no enlarged mediastinal, hilar or axillary lymph nodes.Since the previous CT, the patient has been extubated and the nasogastric tube has been removed. The thyroid gland, trachea and esophagus demonstrate no significant findings. Lungs/Pleura: No pleural effusion or pneumothorax. Significant improvement in the previously demonstrated diffuse bilateral airspace opacities. There are scattered residual airspace opacities, greatest medially within the right upper lobe and within the lingula where there are bandlike opacities with volume loss. Associated traction bronchiectasis and scattered ground-glass opacities in both lungs. Upper abdomen: No acute findings within the visualized upper abdomen. Previous cholecystectomy noted. A previously demonstrated cyst in the upper pole of the left kidney has  decreased in size in the interval; no specific follow-up imaging recommended. Musculoskeletal/Chest wall: There is no chest wall mass or suspicious osseous finding. IMPRESSION: 1. Significant improvement in the previously demonstrated diffuse bilateral airspace opacities with scattered residual airspace opacities, greatest medially within the right  upper lobe and within the lingula where there are bandlike opacities with volume loss. Associated traction bronchiectasis and scattered ground-glass opacities in both lungs. Findings are consistent with resolving multifocal pneumonia. Remaining findings could be secondary to residual infection or early postinflammatory scarring. 2. No other acute chest findings. 3. Aortic atherosclerosis. Electronically Signed   By: Carey Bullocks M.D.   On: 03/10/2023 13:11   DG Chest 2 View Result Date: 03/04/2023 CLINICAL DATA:  Follow up for pneumonia resolution. Patient still has significant SHOB, congested productive cough. Rales, rhonchi and expiratory wheezing present bilaterally, right lower lobes worse than left EXAM: CHEST - 2 VIEW COMPARISON:  Chest x-ray 01/29/2023, chest x-ray 01/26/2023 FINDINGS: The heart and mediastinal contours are unchanged. Left upper lobe linear atelectasis versus scarring. Persistent but improved diffuse interstitial and airspace opacities. Chronic coarsened interstitial markings . No pleural effusion. No pneumothorax. No acute osseous abnormality. IMPRESSION: Persistent but improved improved diffuse interstitial and airspace opacities. Consider CT chest with intravenous contrast for further evaluation. Electronically Signed   By: Tish Frederickson M.D.   On: 03/04/2023 16:52    ipratropium-albuterol (DUONEB) 0.5-2.5 (3) MG/3ML nebulizer solution 3 mL     Date Action Dose Route User   03/04/2023 1430 Given 3 mL Nebulization Pinion, Emily P, CMA          Latest Ref Rng & Units 12/28/2013   12:53 PM 12/01/2013   12:06 PM  PFT Results   FVC-Pre L 3.44  2.60   FVC-Predicted Pre % 93  70   FVC-Post L 3.21    FVC-Predicted Post % 87    Pre FEV1/FVC % % 81  87   Post FEV1/FCV % % 81    FEV1-Pre L 2.79  2.26   FEV1-Predicted Pre % 93  75   FEV1-Post L 2.61      No results found for: "NITRICOXIDE"      Assessment & Plan:     Influenza A with superimposed MRSA pneumonia  Persistent productive cough with green and yellow sputum, unchanged since hospitalization. Recent CT scan shows significant improvement with residual bilateral opacities, consistent with resolving multifocal pneumonia. Noted small amount of intermittent hemoptysis, concerning for possible ongoing infection vs airway inflammation/irritation. Low suspicion for PE given DOE improving - Wells Criteria low probability. Discussed case with Dr. Celine Mans who agreed likely due to airway inflammation. Unable to entirely rule out residual infection so we will have her recollect sputum cultures. If she has cleared, no further abx therapy indicated. If still positive for MRSA, she will likely need PICC line placement with outpatient abx therapy. Discussed mucociliary clearance and bronchodilator therapies. Will treat her with a lower and slower prednisone taper - start at 20 mg since she was unable to tolerate lower doses. Target cough control to minimize further inflammation. Advised using nebs and flutter to help clear secretions and avoid forceful coughing. Emphasized strict return and ED precautions. Close follow up.  - Recheck sputum culture - Administer nebulizer treatments (DuoNeb followed by hypertonic saline) 2-3 times daily - Provide flutter valve for use after nebulizer treatments - Prescribe Mucinex ([phone] mg) twice daily - Start prednisone taper - 20 mg for 5 days then 10 mg for 5 days then stop - Plan for repeat CT scan in 6-8 weeks - Advise strict return and ED precautions - Discussed case with Dr. Celine Mans- hold on further abx therapy and repeat cultures.  Consider IV antibiotic therapy via PICC line placement if MRSA persists  Acute respiratory failure  Appears to have resolved. Will rewalk her at follow up. If symptoms improved and hypoxia resolved, will discontinue. Advised her to monitor for goal>88-90% and utilize as needed - Use oxygen 2 lpm as needed for goal >88-90%  Hypothyroidism Low thyroid levels with recent levothyroxine dosage adjustment. - Monitor thyroid levels and adjust levothyroxine as needed with PCP  Follow-up - 7-10 days  - Plan for repeat CT scan in 6-12 weeks pending sputum culture and treatment plan.       Advised if symptoms do not improve or worsen, to please contact office for sooner follow up or seek emergency care.   I spent 60 minutes of dedicated to the care of this patient on the date of this encounter to include pre-visit review of records, face-to-face time with the patient discussing conditions above, post visit ordering of testing, clinical documentation with the electronic health record, making appropriate referrals as documented, and communicating necessary findings to members of the patients care team.  Noemi Chapel, NP 03/17/2023  Pt aware and understands NP's role.

## 2023-03-18 ENCOUNTER — Other Ambulatory Visit (HOSPITAL_BASED_OUTPATIENT_CLINIC_OR_DEPARTMENT_OTHER): Payer: Self-pay

## 2023-03-18 ENCOUNTER — Telehealth (HOSPITAL_BASED_OUTPATIENT_CLINIC_OR_DEPARTMENT_OTHER): Payer: Self-pay | Admitting: Nurse Practitioner

## 2023-03-18 ENCOUNTER — Encounter: Payer: Self-pay | Admitting: Nurse Practitioner

## 2023-03-18 ENCOUNTER — Other Ambulatory Visit: Payer: Medicare Other

## 2023-03-18 DIAGNOSIS — J189 Pneumonia, unspecified organism: Secondary | ICD-10-CM

## 2023-03-18 NOTE — Telephone Encounter (Signed)
Patient will have to pick up specimen and take it to the [address] location

## 2023-03-18 NOTE — Telephone Encounter (Signed)
Patient states that she lives almost an hour away and it would be too far for her to drive and to get a good specimen. Is there another location that she could drop it off at? Please call and advise [phone]

## 2023-03-18 NOTE — Telephone Encounter (Signed)
Patient dropped off sputum to be tested here at [facility]. Cannot be processed due to order being placed through [facility] and cannot be changed. Routing as an Financial planner and have left a voicemail for the patient

## 2023-03-19 ENCOUNTER — Ambulatory Visit: Payer: Medicare Other | Admitting: Neurology

## 2023-03-19 LAB — RESPIRATORY CULTURE OR RESPIRATORY AND SPUTUM CULTURE: MICRO NUMBER:: 16098279

## 2023-03-19 NOTE — Progress Notes (Signed)
Needs to recollect sputum culture. She has a cup at home. Can drop off at [facility] tomorrow AM. Please re-enter order. Thanks.

## 2023-03-20 ENCOUNTER — Telehealth: Payer: Self-pay | Admitting: Nurse Practitioner

## 2023-03-20 ENCOUNTER — Ambulatory Visit (HOSPITAL_BASED_OUTPATIENT_CLINIC_OR_DEPARTMENT_OTHER): Payer: Medicare Other | Admitting: Family Medicine

## 2023-03-20 DIAGNOSIS — J15212 Pneumonia due to Methicillin resistant Staphylococcus aureus: Secondary | ICD-10-CM

## 2023-03-20 DIAGNOSIS — J189 Pneumonia, unspecified organism: Secondary | ICD-10-CM

## 2023-03-20 LAB — RESPIRATORY CULTURE OR RESPIRATORY AND SPUTUM CULTURE
MICRO NUMBER:: 16092431
SPECIMEN QUALITY:: ADEQUATE

## 2023-03-20 MED ORDER — LINEZOLID 600 MG PO TABS: 600.0000 mg | ORAL_TABLET | Freq: Two times a day (BID) | ORAL | 0 refills | Status: AC

## 2023-03-20 MED ORDER — SODIUM CHLORIDE 3 % IN NEBU: INHALATION_SOLUTION | RESPIRATORY_TRACT | 5 refills | Status: DC | PRN

## 2023-03-20 NOTE — Telephone Encounter (Signed)
03/20/2023: Notified pt of sputum culture - heavy growth of MRSA. Reviewed case with Dr. Francine Graven who was present in office today. We were able to discuss case with ID physician, Dr. Drue Second. They agree pt ok to treat outpt with PO abx at this time. Given development of btx on imaging and persistent symptoms with positive culture, will treat her with 14 day course of linezolid. Will request lab complete linezolid sensitivities but ID felt she would be appropriately covered on this based on current results. Advised patient to continue aggressive mucociliary clearance therapies. She did not receive hypertonic saline from pharmacy. Will resend rx today and advised her to inquire about this when she picks up abx. If not in stock, will need to send elsewhere.  She has a few medication interactions. Advised her to hold mirtazapine while on Linezolid. She will taper Wellbutrin to every other day for the next week then hold for a week while she completes abx. Monitor mood. Ok to resume once off abx. She will closely monitor BP for goal <140/90. Advised to go to ED with HTN urgency/crisis or any worsening symptoms. Strict return/ED precautions and close follow up. Low threshold to readmit pt given risk for decompensation. Repeat CT imaging in 8 weeks for 6 week f/u.

## 2023-03-20 NOTE — Progress Notes (Signed)
Notified pt of sputum culture - heavy growth of MRSA. Reviewed case with Dr. Francine Graven who was present in office today. We were able to discuss case with ID physician, Dr. Drue Second. They agree pt ok to treat outpt with PO abx at this time. Given development of btx on imaging and persistent symptoms with positive culture, will treat her with 14 day course of linezolid. Will request lab complete linezolid sensitivities but ID felt she would be appropriately covered on this based on current results. Advised patient to continue aggressive mucociliary clearance therapies. She did not receive hypertonic saline from pharmacy. Will resend rx today and advised her to inquire about this when she picks up abx. If not in stock, will need to send elsewhere.  She has a few medication interactions. Advised her to hold mirtazapine while on Linezolid. She will taper Wellbutrin to every other day for the next week then hold for a week while she completes abx. Monitor mood. Ok to resume once off abx. She will closely monitor BP for goal <140/90. Advised to go to ED with HTN urgency/crisis or any worsening symptoms. Strict return/ED precautions and close follow up. Low threshold to readmit pt given risk for decompensation. Repeat CT imaging in 8 weeks for 6 week f/u.

## 2023-03-21 ENCOUNTER — Other Ambulatory Visit (HOSPITAL_BASED_OUTPATIENT_CLINIC_OR_DEPARTMENT_OTHER): Payer: Self-pay | Admitting: Family Medicine

## 2023-03-21 NOTE — Telephone Encounter (Signed)
We already collected and got a successful culture. Spoke with pt. Nothing further needed. Thanks!

## 2023-03-25 ENCOUNTER — Telehealth: Payer: Self-pay | Admitting: Nurse Practitioner

## 2023-03-25 DIAGNOSIS — J471 Bronchiectasis with (acute) exacerbation: Secondary | ICD-10-CM

## 2023-03-25 DIAGNOSIS — J15212 Pneumonia due to Methicillin resistant Staphylococcus aureus: Secondary | ICD-10-CM

## 2023-03-25 DIAGNOSIS — J189 Pneumonia, unspecified organism: Secondary | ICD-10-CM

## 2023-03-25 DIAGNOSIS — J209 Acute bronchitis, unspecified: Secondary | ICD-10-CM

## 2023-03-25 NOTE — Telephone Encounter (Signed)
 Called and spoke with the pt to inquire how she is feeling after starting Linezolid  She states overall, she feels she is improving  She is still producing large amounts of green/yellow/brown sputum in the am  Throughout the day she produces, some, but it seems to be less than before  She does have some SOB when she is having a coughing fit  She was able to pick up her saline nebs, and is using these bid  She has monitored her BP and reports normal readings  Denies fevers, aches She plans to keep appt with Katie for 03/27/23 to follow up  She did not any immediate concerns today  Routing to [place] as [employer]

## 2023-03-25 NOTE — Progress Notes (Signed)
 Per Florentina Addison 03/25/23 no more sputum collection needed at this point.

## 2023-03-27 ENCOUNTER — Ambulatory Visit: Payer: Medicare Other | Admitting: Nurse Practitioner

## 2023-03-27 ENCOUNTER — Encounter: Payer: Self-pay | Admitting: Student

## 2023-03-27 NOTE — Telephone Encounter (Signed)
 Pt was a no show for her visit 03/27/23  Called to check on her and see if she wants to reschedule  She did not answer- LMTCB

## 2023-04-03 ENCOUNTER — Ambulatory Visit (INDEPENDENT_AMBULATORY_CARE_PROVIDER_SITE_OTHER): Payer: Medicare Other | Admitting: Family Medicine

## 2023-04-03 ENCOUNTER — Encounter (HOSPITAL_BASED_OUTPATIENT_CLINIC_OR_DEPARTMENT_OTHER): Payer: Self-pay | Admitting: Family Medicine

## 2023-04-03 VITALS — BP 136/80 | HR 61 | Ht 64.0 in | Wt 133.0 lb

## 2023-04-03 DIAGNOSIS — J15212 Pneumonia due to Methicillin resistant Staphylococcus aureus: Secondary | ICD-10-CM | POA: Diagnosis not present

## 2023-04-03 DIAGNOSIS — E039 Hypothyroidism, unspecified: Secondary | ICD-10-CM | POA: Diagnosis not present

## 2023-04-03 MED ORDER — PROMETHAZINE HCL 25 MG PO TABS: 25.0000 mg | ORAL_TABLET | Freq: Three times a day (TID) | ORAL | 3 refills | Status: DC | PRN

## 2023-04-03 NOTE — Progress Notes (Signed)
 Subjective:   Sabrina Roberson 1971-01-09 04/03/2023  Chief Complaint  Patient presents with   Pneumonia    Pt is here following up after recent pna. Pt said that she is beginning to feel better. Has seen pulmonary and was prescribed an abx and was told if the pna was not cleared up after that round of abx that they would do IV abx. States she still has pna in one lung. Pt is still coughing some but not as bad.    HPI: MULTIFOCAL PNEUMONIA:  Sabrina Roberson presents today for re-assessment of MRSA positive multifocal pneumonia.  Patient recently admitted to the hospital from 01/19/2023 to 02/05/2023 with arts, septic shock due to influenza A and MRSA pneumonia.  She was intubated until 01/23/2023 and had to be reintubated on 01/25/2023 due to respiratory distress.  Patient was treated with linezolid and broad-spectrum antibiotics for MRSA positive sputum culture.  Patient was extubated on 01/29/2023 and discharged on 02/05/2023 to home.  Patient followed up with PCP on 02/16/2022 for persistent cough and shortness of breath.  She was placed on Trelegy, albuterol nebs and Promethazine DM.  She returned to PCP on 03/04/2023 due to persistent fatigue and productive cough with concern for persistent pneumonia.  She was recommended to follow-up with pulmonology after chest CT on 03/10/2023 did show residual opacities and groundglass throughout both lungs right greater than left.  Patient was seen by pulmonology on 03/17/2023 and had sputum cultures completed showing heavy growth of MRSA still present.  She was placed on p.o. linezolid for 14 days per ID consultation.  Patient has been using DuoNebs 2-3 times a day with flutter valve, Mucinex twice a day, and cough syrup as needed. She states she is feeling better but is still having significant congestion with a productive cough with green and yellow sputum.. She has taken the prednisone but was not able to complete the taper due to shakiness.   Patient missed  her follow-up appointment with Dr. Allison Quarry within the past week.  Will attempt to reschedule.  Pulmonology would like to repeat her CT in 8 weeks to evaluate clearance and consider possible PICC line.  Patient states she has 3 days left of linezolid therapy.   HYPOTHYROIDISM: Patient presents for the medical management of Hypothyroidism. Current medication: Levothyroxine 88 mcg (previously changed from 100 mcg on 03/04/2023) Patient compliant with medication regimen: yes  Fatigue: yes Cold intolerance: no Weight gain/loss: no Constipation: no Lower extremity edema: no Palpitations: no Hoarseness: no Neck Pain/Compression: no Difficulty Swallowing: no  Lab Results  Component Value Date   TSH 0.205 (L) 03/04/2023     The following portions of the patient's history were reviewed and updated as appropriate: past medical history, past surgical history, family history, social history, allergies, medications, and problem list.   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   DOE (dyspnea on exertion) 12/21/2013   Benzodiazepine dependence, episodic (HCC) 12/23/2011   DDD (  degenerative disc disease), cervical 12/23/2011   DDD (degenerative disc disease), lumbosacral 12/23/2011   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   Fibromyalgia 02/11/2011   Past Medical History:  Diagnosis Date   Abdominal pain 02/11/2011    Abnormal CT of the chest 02/04/2014   CT chest 08/2013:  atx in RML and lingula  CT angio 12/2013:  No PE, RML scarring, abnormal density in lingula  ([facility] at [facility])     Allergy    Anxiety    Arthritis    Asthma    Bruises easily    Chronic chest wall pain    Chronic pain    Cough    Depression    Diabetes mellitus without complication (HCC)    Dyspnea    chronic   Dysuria 09/18/2022   Encounter for therapeutic drug monitoring 02/11/2011   Fibromyalgia    Functional movement disorder    GERD (gastroesophageal reflux disease)    Headache    Hearing loss    High cholesterol    History of hiatal hernia    Hypertension    Hypothyroidism    Kidney disease    stage 3   Leg swelling    both   Nausea 02/11/2011   Nausea and vomiting 04/01/2013   Pain management    PONV (postoperative nausea and vomiting)    Sebaceous cyst of breast, right subaereolar 10/30/2010   Septic shock (HCC) 01/19/2023   SMAS (superior mesenteric artery syndrome) (HCC)    Thyroid disease    TIA (transient ischemic attack)    Tic disorder    TMJ (dislocation of temporomandibular joint)    Transient cerebral ischemia    Type 2 diabetes mellitus with complications (HCC)    Past Surgical History:  Procedure Laterality Date   AORTA - SUPERIOR MESENTERIC AND AORTA - RENAL ARTERY BYPASS GRAFT     APPENDECTOMY     BREAST CYST EXCISION Right    BREAST EXCISIONAL BIOPSY     CHOLECYSTECTOMY     cyst removed     HERNIA REPAIR     OOPHORECTOMY     bilateral   TEE WITHOUT CARDIOVERSION N/A 06/18/2012   Procedure: TRANSESOPHAGEAL ECHOCARDIOGRAM (TEE);  Surgeon: Thurmon Fair, MD;  Location: [facility] ENDOSCOPY;  Service: Cardiovascular;  Laterality: N/A;   TOTAL ABDOMINAL HYSTERECTOMY     Family History  Problem Relation Age of Onset   Hypertension Sister    Asthma Father    Cancer Paternal Grandmother        breast   Heart disease Paternal Grandmother    Breast cancer Paternal Grandmother     Asthma Paternal Grandmother    Pulmonary fibrosis Maternal Grandmother    Emphysema Maternal Grandmother    Heart disease Maternal Grandmother    Breast cancer Maternal Grandmother    Leukemia Paternal Grandfather    Heart disease Paternal Grandfather    Clotting disorder Paternal Grandfather    Parkinson's disease Maternal Aunt    Asthma Maternal Grandfather    Heart disease Maternal Grandfather    Outpatient Medications Prior to Visit  Medication Sig Dispense Refill   albuterol (VENTOLIN HFA) 108 (90 Base) MCG/ACT inhaler Inhale 2 puffs into the lungs every 6 (six) hours as needed for wheezing or shortness of breath. 8 g 2   atorvastatin (LIPITOR) 20 MG tablet Take 1 tablet (20 mg total) by mouth at bedtime. 90 tablet 3   buPROPion ER (WELLBUTRIN SR) 100 MG 12 hr tablet Take 1 tablet (100 mg  total) by mouth daily. 90 tablet 3   diazepam (VALIUM) 5 MG tablet Take 1 tablet (5 mg total) by mouth 2 (two) times daily as needed for anxiety. 30 tablet 3   dicyclomine (BENTYL) 10 MG capsule Take 1 capsule (10 mg total) by mouth 4 (four) times daily -  before meals and at bedtime. (Patient taking differently: Take 10 mg by mouth 3 (three) times daily as needed for spasms.) 60 capsule 1   gabapentin (NEURONTIN) 600 MG tablet Take 1 tablet (600 mg total) by mouth at bedtime. 60 tablet 3   ipratropium-albuterol (DUONEB) 0.5-2.5 (3) MG/3ML SOLN Take 3 mLs by nebulization every 4 (four) hours as needed (shortness of breath, wheezing, or cough). 360 mL 2   levothyroxine (SYNTHROID) 88 MCG tablet Take 1 tablet (88 mcg total) by mouth daily before breakfast. 30 tablet 3   linezolid (ZYVOX) 600 MG tablet Take 1 tablet (600 mg total) by mouth 2 (two) times daily for 14 days. 28 tablet 0   metFORMIN (GLUCOPHAGE-XR) 500 MG 24 hr tablet Take 1 tablet (500 mg total) by mouth daily with breakfast. 90 tablet 3   mirtazapine (REMERON) 15 MG tablet Take 1 tablet (15 mg total) by mouth at bedtime. 30 tablet 0    mupirocin cream (BACTROBAN) 2 % APPLY TO AFFECTED AREA TWICE A DAY 15 g 1   nystatin cream (MYCOSTATIN) Apply 1 Application topically 2 (two) times daily.     propranolol (INDERAL) 80 MG tablet Take 1 tablet (80 mg total) by mouth daily. 90 tablet 3   sodium chloride HYPERTONIC 3 % nebulizer solution Take by nebulization as needed for cough. Use 3 mL Twice daily until symptoms improve 75 mL 5   traZODone (DESYREL) 100 MG tablet Take 100 mg by mouth at bedtime.     promethazine (PHENERGAN) 25 MG tablet Take 1 tablet (25 mg total) by mouth every 8 (eight) hours as needed for nausea or vomiting. 30 tablet 3   pantoprazole (PROTONIX) 40 MG tablet Take 1 tablet (40 mg total) by mouth daily. 30 tablet 0   chlorpheniramine-HYDROcodone (TUSSIONEX) 10-8 MG/5ML Take 5 mLs by mouth every 12 (twelve) hours as needed for cough. 180 mL 0   predniSONE (DELTASONE) 10 MG tablet 2 tabs for 5 days, then 1 tab for 5 days, then stop 15 tablet 0   No facility-administered medications prior to visit.   Allergies  Allergen Reactions   Latex Anaphylaxis and Swelling   Codeine Nausea And Vomiting    Other reaction(s): GI Upset (intolerance), Vomiting (intolerance)   Ibuprofen Other (See Comments)    GI upset, burning sensation   Oxycodone Nausea And Vomiting    Pre-medication with phenergan needed.   Tape Itching and Rash    Please use "paper" tape only.     ROS: A complete ROS was performed with pertinent positives/negatives noted in the HPI. The remainder of the ROS are negative.    Objective:   Today's Vitals   04/03/23 1330 04/03/23 1400  BP: (!) 154/86 136/80  Pulse: 61   SpO2: 98%   Weight: 133 lb (60.3 kg)   Height: 5\' 4"  (1.626 m)     Physical Exam          GENERAL: Well-appearing, in NAD. Well nourished.  SKIN: Pink, warm and dry.  Head: Normocephalic. NECK: Trachea midline. Full ROM w/o pain or tenderness.  THROAT: Uvula midline. Oropharynx clear.  Mucous membranes pink and moist.   RESPIRATORY: Chest wall symmetrical. Respirations  even, nonlabored at rest.  Mild dyspnea with exertion.  Bilateral expiratory wheezing present, right greater than left.  Bilateral rhonchi present, right greater than left.  Congested nonproductive cough.  CARDIAC: S1, S2 present, regular rate and rhythm without murmur or gallops. Peripheral pulses 2+ bilaterally.  MSK: Muscle tone and strength appropriate for age.  EXTREMITIES: Without clubbing, cyanosis, or edema.  NEUROLOGIC: No motor or sensory deficits. Steady, even gait. C2-C12 intact.  PSYCH/MENTAL STATUS: Alert, oriented x 3. Cooperative, appropriate mood and affect.      Assessment & Plan:  1. Pneumonia due to methicillin resistant Staphylococcus aureus (MRSA), unspecified laterality, unspecified part of lung (HCC) (Primary) Unresolved.  Mildly improved cough, expiratory wheezing and rhonchi upon exam today by PCP.  Will recommend that patient schedule a follow-up with pulmonology within the next week to discuss possible PICC line as I do believe she would have a benefit for improved clearance with this antibiotic therapy.  Patient agreeable and will route patient's chart to Dr. Allison Quarry for review.  Patient agreeable to schedule appointment with pulmonology.  Recommend she continue her Mucinex, DuoNebs, and finish linezolid as directed.  2. Acquired hypothyroidism Will evaluate for control with TSH today given recent levothyroxine dosage change.  Patient currently asymptomatic for thyroid disease. - TSH   Meds ordered this encounter  Medications   promethazine (PHENERGAN) 25 MG tablet    Sig: Take 1 tablet (25 mg total) by mouth every 8 (eight) hours as needed for nausea or vomiting.    Dispense:  30 tablet    Refill:  3    Supervising Provider:   DE Peru, RAYMOND J [[id number]]   Lab Orders         TSH     Return in about 2 months (around 06/03/2023) for Follow up Chronic Conditions.    Patient to reach out to office if new,  worrisome, or unresolved symptoms arise or if no improvement in patient's condition. Patient verbalized understanding and is agreeable to treatment plan. All questions answered to patient's satisfaction.    Hilbert Bible, [place]

## 2023-04-04 ENCOUNTER — Encounter (HOSPITAL_BASED_OUTPATIENT_CLINIC_OR_DEPARTMENT_OTHER): Payer: Self-pay | Admitting: Family Medicine

## 2023-04-04 LAB — TSH: TSH: 0.49 u[IU]/mL (ref 0.450–4.500)

## 2023-04-04 MED ORDER — PREDNISONE 10 MG PO TABS: ORAL_TABLET | ORAL | 0 refills | Status: DC

## 2023-04-04 NOTE — Telephone Encounter (Addendum)
 04/04/2023: Spoke with pt and reviewed symptoms. She tells me she was feeling much better so she had stopped doing her nebs, hypertonic saline, flutter and mucinex. She then got a head cold a few days ago and feels like her symptoms started back then with productive cough with yellow to green phlegm and some chest congestion. She has not restarted any of the mucociliary clearance therapies yet. She does still feel like she is much better than she was before she went back on antibiotics. She sounds well on the phone but does have a congested cough. She has two days left of linezolid; possible she missed a day. She's not having any recurrent hemoptysis, fevers, chills. Eating and drinking well. Energy levels are much improved.  Discussed with Dr. Francine Graven yesterday. Agreed that she likely needs to target mucociliary clearance and can hold off on further antimicrobial therapy. She has been appropriately covered at this point with extended course of Linezolid. Will monitor her off abx therapy, target clearance, and add on short course of prednisone for airway inflammation. She has difficulties tolerating higher doses so will treat her with 20 mg x 5 days then 10 mg x 5 days then stop. She will notify if symptoms fail to improve or worsen. At that point, would need to consider IV abx therapy and ID consult.  Will add on vest therapy given difficulties clearing infection despite aggressive regimen.  Close follow up next week, 3/13. Strict ED precautions.   Patient has has daily productive cough requiring multiple courses of antibiotic therapy.  Flutter valve has been tried, but has not effectively mobilized secretions.  Considered and ruled out manual CPT as no consistent skilled caregiver available to perform therapy.  CT scan performed on 03/10/2023 confirms bronchiectasis.  Starting patient on vest therapy.

## 2023-04-04 NOTE — Progress Notes (Signed)
 Sabrina Roberson, Your TSH is now in a normal limit.  Please continue on the current dosage of the levothyroxine and we will recheck at your next appointment.

## 2023-04-07 NOTE — Telephone Encounter (Signed)
 Sabrina Chapel, Sabrina Roberson to Sabrina Roberson, Sabrina Roberson      04/04/23 10:20 AM Please DB pt 3/13 at 230 pm. Pt aware of appt time/location. Thanks!   The pt was scheduled as requested by Katie  Nothing further needed

## 2023-04-10 ENCOUNTER — Ambulatory Visit: Admitting: Nurse Practitioner

## 2023-04-18 ENCOUNTER — Other Ambulatory Visit (HOSPITAL_BASED_OUTPATIENT_CLINIC_OR_DEPARTMENT_OTHER): Payer: Self-pay | Admitting: Family Medicine

## 2023-04-25 ENCOUNTER — Telehealth: Payer: Self-pay | Admitting: Student

## 2023-04-25 DIAGNOSIS — J479 Bronchiectasis, uncomplicated: Secondary | ICD-10-CM

## 2023-04-25 DIAGNOSIS — J15212 Pneumonia due to Methicillin resistant Staphylococcus aureus: Secondary | ICD-10-CM

## 2023-04-25 DIAGNOSIS — J189 Pneumonia, unspecified organism: Secondary | ICD-10-CM

## 2023-04-25 NOTE — Telephone Encounter (Signed)
 I don't see a CT since the one we did in February which we already discussed and treated her for? She was supposed to have follow up with me a few times because we had to retreat her for MRSA pneumonia and she no showed to the last two appts. Her next CT should be mid to end of April for 6 week follow up after completing Linezolid. The order for this is in. She needs to keep her next appt with me to review CT results and examine her.

## 2023-04-25 NOTE — Telephone Encounter (Signed)
Please review CT

## 2023-04-25 NOTE — Telephone Encounter (Signed)
 PT writes on [facility] the following question:  Can you please ask Ms.Cobb about this Ct scan of chest for me! She told my husband and I she wanted to repeat it and make sure pneumonia was gone and everything looked okay! She also may want to get with primary doctor Jerre Simon at [facility]! They both have been discussing me ever since I was in hospital with pneumonia and sepsis and flu and on ventilator twice! Please ask for me    Please call PT to advise @ [phone]

## 2023-04-28 ENCOUNTER — Ambulatory Visit (HOSPITAL_BASED_OUTPATIENT_CLINIC_OR_DEPARTMENT_OTHER): Payer: Medicare Other | Admitting: Cardiology

## 2023-04-30 NOTE — Telephone Encounter (Signed)
 The patient has been scheduled for their CT scan. The follow-up appointment with Florentina Addison was rescheduled to allow time for the results to be received.NFN

## 2023-04-30 NOTE — Telephone Encounter (Signed)
 Pt states she is still coughing up thick green and brown mucous. Pt states she wants to get rid of this. FYI to [facility]   [facility]- Can someone contact the pt to have the CT scheduled? It is taking 3-4 weeks for results now.

## 2023-05-16 ENCOUNTER — Ambulatory Visit (HOSPITAL_BASED_OUTPATIENT_CLINIC_OR_DEPARTMENT_OTHER)
Admission: RE | Admit: 2023-05-16 | Discharge: 2023-05-16 | Disposition: A | Discharge status: Home / Self Care | Source: Ambulatory Visit | Attending: Nurse Practitioner | Admitting: Nurse Practitioner

## 2023-05-16 DIAGNOSIS — J15212 Pneumonia due to Methicillin resistant Staphylococcus aureus: Secondary | ICD-10-CM | POA: Diagnosis present

## 2023-05-16 DIAGNOSIS — J189 Pneumonia, unspecified organism: Secondary | ICD-10-CM | POA: Insufficient documentation

## 2023-05-16 DIAGNOSIS — J479 Bronchiectasis, uncomplicated: Secondary | ICD-10-CM | POA: Insufficient documentation

## 2023-05-28 ENCOUNTER — Ambulatory Visit: Payer: Self-pay

## 2023-05-28 ENCOUNTER — Telehealth: Payer: Self-pay | Admitting: Nurse Practitioner

## 2023-05-28 DIAGNOSIS — J479 Bronchiectasis, uncomplicated: Secondary | ICD-10-CM

## 2023-05-28 NOTE — Telephone Encounter (Signed)
 Left voicemail for patient to call back to schedule a sooner appointment and sent mychart. Please offer tomorrow 05/29/2023 at 4pm with Dr. Diania Fortes or 5/13 at 4pm with Dr. Diania Fortes. Per Alston Jerry, "needs to be seen for CT follow up and establish with a doc ".

## 2023-05-28 NOTE — Telephone Encounter (Signed)
 Is she having any acute symptoms? Productive cough, chest congestion, fevers, hemoptysis, etc? She had MRSA pneumonia, which we previously treated. She has new changes on her CT. Given timeline in which these developed, possible these are infectious/inflammatory changes.   She has upcoming appointment with Dr. Diania Fortes 5/13. He was able to see her tomorrow at 4 pm as well if she can make it to that. This will be to review next steps but if she's having acute symptoms, may need to consider abx before this.

## 2023-05-28 NOTE — Telephone Encounter (Signed)
 Chief Complaint: foot pain Symptoms: foot pain Frequency: x 5 days Pertinent Negatives: Patient denies fever or calf pain Disposition: [] ED /[] Urgent Care (no appt availability in office) / [x] Appointment(In office/virtual)/ []  [place] [facility]/ [] Home Care/ [] Refused Recommended Disposition /[] [place] [facility]/ []  Follow-up with PCP Additional Notes: pt states foot pain x 5 days. States she the pain is severe when she walks on her right foot. States pain 10/10 and she has tried ibuprofen. States some numbness and swelling and feel warm. Denies redness. Denies and leg or calf pain. States pain mostly in heel area.   Copied from CRM [id number]. Topic: Clinical - Red Word Triage >> May 28, 2023  9:25 AM Turkey B wrote: [facility] that prompted transfer to Nurse Triage: pt has right foot, severe pain Reason for Disposition  [1] Swollen foot AND [2] no fever  (Exceptions: localized bump from bunions, calluses, insect bite, sting)  Answer Assessment - Initial Assessment Questions 1. ONSET: "When did the pain start?"      5 days ago 2. LOCATION: "Where is the pain located?"      Right foot 3. PAIN: "How bad is the pain?"    (Scale 1-10; or mild, moderate, severe)  - MILD (1-3): doesn't interfere with normal activities.   - MODERATE (4-7): interferes with normal activities (e.g., work or school) or awakens from sleep, limping.   - SEVERE (8-10): excruciating pain, unable to do any normal activities, unable to walk.      10/10 4. WORK OR EXERCISE: "Has there been any recent work or exercise that involved this part of the body?"      no 5. CAUSE: "What do you think is causing the foot pain?"     unknown 6. OTHER SYMPTOMS: "Do you have any other symptoms?" (e.g., leg pain, rash, fever, numbness)    Numbness and swelling.  Protocols used: Foot Pain-A-AH

## 2023-05-28 NOTE — Telephone Encounter (Signed)
 Copied from CRM [id number]. Topic: Clinical - Lab/Test Results >> May 28, 2023  1:43 PM Tyronne Galloway wrote: Reason for CRM: Pt is requesting a call from a nurse regarding the results from her CT scan competed on 05/16/2023. Pt stated there was a message about a masslike found on her lung and is concerned. Please call the patient back at [phone] ok to leave a vm.  Katie, please advise results

## 2023-05-28 NOTE — Telephone Encounter (Signed)
 Patient was offered today but she can't come will see her 5/1

## 2023-05-29 ENCOUNTER — Encounter (HOSPITAL_BASED_OUTPATIENT_CLINIC_OR_DEPARTMENT_OTHER): Payer: Self-pay | Admitting: Family Medicine

## 2023-05-29 ENCOUNTER — Ambulatory Visit (INDEPENDENT_AMBULATORY_CARE_PROVIDER_SITE_OTHER): Admitting: Family Medicine

## 2023-05-29 VITALS — BP 130/77 | HR 51 | Ht 64.0 in | Wt 153.0 lb

## 2023-05-29 DIAGNOSIS — J15212 Pneumonia due to Methicillin resistant Staphylococcus aureus: Secondary | ICD-10-CM | POA: Diagnosis not present

## 2023-05-29 DIAGNOSIS — R2243 Localized swelling, mass and lump, lower limb, bilateral: Secondary | ICD-10-CM

## 2023-05-29 HISTORY — DX: Localized swelling, mass and lump, lower limb, bilateral: R22.43

## 2023-05-29 MED ORDER — FUROSEMIDE 20 MG PO TABS: 20.0000 mg | ORAL_TABLET | Freq: Every day | ORAL | 3 refills | Status: AC

## 2023-05-29 NOTE — Progress Notes (Signed)
 Acute Care Office Visit  Subjective:   Sabrina Roberson [birth year] 05/29/2023  Chief Complaint  Patient presents with   Foot Pain    Patient has been having pain in the heel of her right foot for the past 5 days. Foot and ankle is also swollen. Also has noticed swelling in face and has had weight gain recently.    HPI: RIGHT FOOT PAIN:  Patient states she began having pain, swelling to right lower extremity for the past 1 week. She states she can feel a "throbbing sensation" when foot is in dependent position. She denies improvement of swelling with elevation. She has noticed some bruising to bilateral lower extremities and swelling to bilateral lower legs without drainage.  Patient was previously on furosemide  but was taken off at time of hospitalization in December to January 2024 2025.  She reports history of vein studies have been unremarkable for varicosities, DVT, thrombophlebitis, or venous insufficiency.  She denies redness, warmth, or discoloration to lower extremities.  Patient has had approx. 20lbs weight gain in the past 1-2 months.  She has noticed significant swelling in her face.  Patient did have an echocardiogram on 01/20/2023 during hospitalization which did not show any significant ejection fraction declined.  LVEF was 50 to 55% with low normal function.  No regional wall abnormalities.  Right ventricular systolic function was low normal.  No evidence of mitral or aortic stenosis.  She is still recovering from multifocal pneumonia due to MRSA and bronchiectasis.  Patient is currently managed by pulmonology and recently had CT chest on 05/16/2023.  She has a follow-up with Dr. Diania Fortes on 06/10/2023.  She reports that she is still having significant congested, productive cough with green and brown sputum.  She is taking medications as directed and finished antibiotic therapy.  She reports dyspnea on exertion that is chronic since time of multifocal pneumonia onset.  Wt  Readings from Last 3 Encounters:  05/29/23 153 lb (69.4 kg)  04/03/23 133 lb (60.3 kg)  03/04/23 137 lb 9.6 oz (62.4 kg)    The following portions of the patient's history were reviewed and updated as appropriate: past medical history, past surgical history, family history, social history, allergies, medications, and problem list.   Patient Active Problem List   Diagnosis Date Noted   Localized swelling of both lower legs 05/29/2023   Pneumonia due to methicillin resistant Staphylococcus aureus (MRSA) (HCC) 04/03/2023   Physical deconditioning 02/17/2023   Bilateral lower extremity edema 12/03/2022   Bilateral carotid bruits 11/04/2022   CKD (chronic kidney disease) stage 2, GFR 60-89 ml/min 11/04/2022   Mixed hyperlipidemia 10/10/2022   GAD (generalized anxiety disorder) 10/10/2022   Acquired hypothyroidism 10/10/2022   Hypertension    Type 2 diabetes mellitus with diabetic chronic kidney disease (HCC)    Primary insomnia 09/18/2022   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   DOE (dyspnea on exertion) 12/21/2013   Benzodiazepine dependence, episodic (HCC) 12/23/2011   DDD (degenerative disc disease), cervical 12/23/2011   DDD (degenerative disc disease), lumbosacral 12/23/2011   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   Fibromyalgia 02/11/2011   Past Medical History:  Diagnosis Date   Abdominal pain 02/11/2011   Abnormal CT of the chest 02/04/2014   CT chest 08/2013:  atx in RML and lingula  CT angio 12/2013:  No PE, RML scarring, abnormal density in lingula  (done at [facility])     Allergy     Anxiety    Arthritis  Asthma    Bruises easily    Chronic chest wall pain    Chronic pain    Cough    Depression    Diabetes mellitus without complication (HCC)    Dyspnea    chronic   Dysuria 09/18/2022   Encounter for therapeutic drug monitoring 02/11/2011   Fibromyalgia    Functional movement disorder    GERD (gastroesophageal reflux disease)    Headache     Hearing loss    High cholesterol    History of hiatal hernia    Hypertension    Hypothyroidism    Kidney disease    stage 3   Leg swelling    both   Nausea 02/11/2011   Nausea and vomiting 04/01/2013   Pain management    PONV (postoperative nausea and vomiting)    Sebaceous cyst of breast, right subaereolar 10/30/2010   Septic shock (HCC) 01/19/2023   SMAS (superior mesenteric artery syndrome) (HCC)    Thyroid  disease    TIA (transient ischemic attack)    Tic disorder    TMJ (dislocation of temporomandibular joint)    Transient cerebral ischemia    Type 2 diabetes mellitus with complications (HCC)    Past Surgical History:  Procedure Laterality Date   AORTA - SUPERIOR MESENTERIC AND AORTA - RENAL ARTERY BYPASS GRAFT     APPENDECTOMY     BREAST CYST EXCISION Right    BREAST EXCISIONAL BIOPSY     CHOLECYSTECTOMY     cyst removed     HERNIA REPAIR     OOPHORECTOMY     bilateral   TEE WITHOUT CARDIOVERSION N/A 06/18/2012   Procedure: TRANSESOPHAGEAL ECHOCARDIOGRAM (TEE);  Surgeon: Luana Rumple, MD;  Location: [facility] ENDOSCOPY;  Service: Cardiovascular;  Laterality: N/A;   TOTAL ABDOMINAL HYSTERECTOMY     Family History  Problem Relation Age of Onset   Hypertension Sister    Asthma Father    Cancer Paternal Grandmother        breast   Heart disease Paternal Grandmother    Breast cancer Paternal Grandmother    Asthma Paternal Grandmother    Pulmonary fibrosis Maternal Grandmother    Emphysema Maternal Grandmother    Heart disease Maternal Grandmother    Breast cancer Maternal Grandmother    Leukemia Paternal Grandfather    Heart disease Paternal Grandfather    Clotting disorder Paternal Grandfather    Parkinson's disease Maternal Aunt    Asthma Maternal Grandfather    Heart disease Maternal Grandfather    Outpatient Medications Prior to Visit  Medication Sig Dispense Refill   albuterol  (VENTOLIN  HFA) 108 (90 Base) MCG/ACT inhaler Inhale 2 puffs into the lungs  every 6 (six) hours as needed for wheezing or shortness of breath. 8 g 2   atorvastatin  (LIPITOR) 20 MG tablet Take 1 tablet (20 mg total) by mouth at bedtime. 90 tablet 3   buPROPion  ER (WELLBUTRIN  SR) 100 MG 12 hr tablet Take 1 tablet (100 mg total) by mouth daily. 90 tablet 3   diazepam  (VALIUM ) 5 MG tablet Take 1 tablet (5 mg total) by mouth 2 (two) times daily as needed for anxiety. 30 tablet 3   dicyclomine  (BENTYL ) 10 MG capsule Take 1 capsule (10 mg total) by mouth 4 (four) times daily -  before meals and at bedtime. (Patient taking differently: Take 10 mg by mouth 3 (three) times daily as needed for spasms.) 60 capsule 1   gabapentin  (NEURONTIN ) 600 MG tablet Take 1 tablet (600 mg total)  by mouth at bedtime. 60 tablet 3   ipratropium-albuterol  (DUONEB) 0.5-2.5 (3) MG/3ML SOLN Take 3 mLs by nebulization every 4 (four) hours as needed (shortness of breath, wheezing, or cough). 360 mL 2   levothyroxine  (SYNTHROID ) 88 MCG tablet Take 1 tablet (88 mcg total) by mouth daily before breakfast. 30 tablet 3   metFORMIN  (GLUCOPHAGE -XR) 500 MG 24 hr tablet Take 1 tablet (500 mg total) by mouth daily with breakfast. 90 tablet 3   mirtazapine  (REMERON ) 15 MG tablet Take 1 tablet (15 mg total) by mouth at bedtime. 30 tablet 0   mupirocin  cream (BACTROBAN ) 2 % APPLY TO AFFECTED AREA TWICE A DAY 15 g 1   nystatin cream (MYCOSTATIN) Apply 1 Application topically 2 (two) times daily.     promethazine  (PHENERGAN ) 25 MG tablet Take 1 tablet (25 mg total) by mouth every 8 (eight) hours as needed for nausea or vomiting. 30 tablet 3   propranolol  (INDERAL ) 80 MG tablet Take 1 tablet (80 mg total) by mouth daily. 90 tablet 3   sodium chloride  HYPERTONIC 3 % nebulizer solution Take by nebulization as needed for cough. Use 3 mL Twice daily until symptoms improve 75 mL 5   traZODone  (DESYREL ) 100 MG tablet TAKE 1 TABLET BY MOUTH EVERYDAY AT BEDTIME 30 tablet 3   pantoprazole  (PROTONIX ) 40 MG tablet Take 1 tablet (40 mg  total) by mouth daily. 30 tablet 0   predniSONE  (DELTASONE ) 10 MG tablet 2 tabs for 5 days then 1 tab for 5 days then stop 15 tablet 0   No facility-administered medications prior to visit.   Allergies  Allergen Reactions   Latex Anaphylaxis and Swelling   Codeine Nausea And Vomiting    Other reaction(s): GI Upset (intolerance), Vomiting (intolerance)   Ibuprofen Other (See Comments)    GI upset, burning sensation   Oxycodone  Nausea And Vomiting    Pre-medication with phenergan  needed.   Tape Itching and Rash    Please use "paper" tape only.     ROS: A complete ROS was performed with pertinent positives/negatives noted in the HPI. The remainder of the ROS are negative.    Objective:   Today's Vitals   05/29/23 1049  BP: 130/77  Pulse: (!) 51  SpO2: 96%  Weight: 153 lb (69.4 kg)  Height: 5\' 4"  (1.626 m)    GENERAL: Well-appearing, in NAD. Well nourished.  SKIN: Pink, warm and dry.  Head: Normocephalic. Mild edema, non pitting, to generalized aspect of face.  NECK: Trachea midline. Full ROM w/o pain or tenderness. No lymphadenopathy.  THROAT: Uvula midline. Oropharynx clear.  Mucous membranes pink and moist.  RESPIRATORY: Chest wall symmetrical. Respirations even and non-labored at rest. Breath sounds mildly diminished to auscultation bilaterally. Cough is congested, non productive in exam room.  CARDIAC: S1, S2 present, regular rate and rhythm without murmur or gallops. Peripheral pulses 2+ bilaterally.  MSK: Muscle tone and strength appropriate for age.  EXTREMITIES: Without clubbing, cyanosis. +2 pitting edema to bilateral lower extremities,  RLE worse than left.   NEUROLOGIC: No motor or sensory deficits. Steady, even gait. C2-C12 intact.  PSYCH/MENTAL STATUS: Alert, oriented x 3. Cooperative, appropriate mood and affect.    No results found for any visits on 05/29/23.    Assessment & Plan:  1. Localized swelling of both lower legs (Primary) Swelling likely due to  fluid overload. No signs of DVT w/ unilateral erythema. Echo December 2024 reviewed by PCP. +3 pedal pulses bilaterally to BLE. Will assess BMP  and BNP w/ labs today. Patient to start Lasix  20mg  daily x 5 days and return in 1 week as scheduled. Will notify Pulmonology of worsening edema.   - Basic Metabolic Panel (BMET) - Brain natriuretic peptide - furosemide  (LASIX ) 20 MG tablet; Take 1 tablet (20 mg total) by mouth daily for 5 days.  Dispense: 30 tablet; Refill: 3  2. Pneumonia due to methicillin resistant Staphylococcus aureus (MRSA), unspecified laterality, unspecified part of lung [facility]) Managed currently by Pulmonology. Patient scheduled in May to review CT new findings. Possible cardiac strain with current BLE possibly due to decreased pulmonary function.   Meds ordered this encounter  Medications   furosemide  (LASIX ) 20 MG tablet    Sig: Take 1 tablet (20 mg total) by mouth daily for 5 days.    Dispense:  30 tablet    Refill:  3    Supervising Provider:   DE Peru, RAYMOND J [[id number]]   Lab Orders         Basic Metabolic Panel (BMET)         Brain natriuretic peptide      Return in 1 week (on 06/05/2023), or if symptoms worsen or fail to improve.    Patient to reach out to office if new, worrisome, or unresolved symptoms arise or if no improvement in patient's condition. Patient verbalized understanding and is agreeable to treatment plan. All questions answered to patient's satisfaction.    Nonda Bays, [place]

## 2023-05-30 LAB — BASIC METABOLIC PANEL WITH GFR
BUN/Creatinine Ratio: 13 (ref 9–23)
BUN: 13 mg/dL (ref 6–24)
CO2: 27 mmol/L (ref 20–29)
Calcium: 8.8 mg/dL (ref 8.7–10.2)
Chloride: 104 mmol/L (ref 96–106)
Creatinine, Ser: 1.01 mg/dL — ABNORMAL HIGH (ref 0.57–1.00)
Glucose: 69 mg/dL — ABNORMAL LOW (ref 70–99)
Potassium: 4.2 mmol/L (ref 3.5–5.2)
Sodium: 146 mmol/L — ABNORMAL HIGH (ref 134–144)
eGFR: 68 mL/min/{1.73_m2} (ref >59)

## 2023-05-30 LAB — BRAIN NATRIURETIC PEPTIDE: BNP: 50.7 pg/mL (ref 0.0–100.0)

## 2023-05-30 NOTE — Telephone Encounter (Signed)
 Pt notified cups ready, order printed and in the bag with instructions she will pickup on Monday

## 2023-05-30 NOTE — Telephone Encounter (Signed)
 Sabrina Clarke, NP to Me      05/29/23  4:53 PM  Pt's PCP reached out to me today. Lung exam is overall improved. Having some s/s of volume overload, which she is working up. She did report that per pt "still very congested and productive with green/brown sputum. It sounds like it is very thick and she reported getting "choked" with it often because of how thick it is ." She's had two courses of linezolid , last one being 14 days. Need to repeat sputum cultures - sputum culture (1), fungal (2) and AFB (3). See if she can pick these up from [facility] office as I know this is much closer for her.   Make sure she is using guaifenesin  bid, albuterol  nebs followed by hypertonic saline nebs 2-3 times a day and then flutter valve 10 times. Thanks!  I called and spoke with the pt and notified of response from [place]. She verbalized understanding. Lab order placed for sputum. Carolyn Cisco- will you please leave her 3 cups and sputum collection instructions for her to pick up at [facility]? Thanks!!

## 2023-06-02 ENCOUNTER — Telehealth: Payer: Self-pay

## 2023-06-02 NOTE — Progress Notes (Signed)
 No significant elevation of BNP.  Kidney function is stable, electrolytes also stable.  Will continue with Lasix  as directed for bilateral lower extremity edema.

## 2023-06-02 NOTE — Telephone Encounter (Signed)
 Copied from CRM [id number]. Topic: Clinical - Medical Advice >> May 30, 2023 10:58 AM Margarette Shawl wrote: Reason for CRM:   Pt is set to pick up supplies for sputum testing from [facility] today; however, she is calling to share that she is getting teeth pulled on 05/05 and will having bleeding in her mouth for at least 3 to 4 days. She is concerned this may affect the results from the sputum cultures.   Requests call back  #[phone]   Please advise Katie what pt needs to do regarding sputum culture

## 2023-06-03 ENCOUNTER — Telehealth (HOSPITAL_BASED_OUTPATIENT_CLINIC_OR_DEPARTMENT_OTHER): Payer: Self-pay | Admitting: Family Medicine

## 2023-06-03 NOTE — Telephone Encounter (Signed)
 Yes, can impact results. Would monitor bleeding and collect as able. Thanks.

## 2023-06-03 NOTE — Telephone Encounter (Signed)
 Patient left message with e2c2 asking if she needed to come in for her next appt regarding her foot pain states she lost 10 pounds and taking ibuprophen she wanted to know if she needed to keep her follow up on 5/8. Let me know and I can call her thank you

## 2023-06-03 NOTE — Telephone Encounter (Signed)
 Sabrina Roberson, please advise if pt needs to keep appt as scheduled.

## 2023-06-03 NOTE — Telephone Encounter (Signed)
 Attempted to call pt but line went directly to VM. Have sent pt a mychart message.  Routing to myself as well as Howell Macintosh for help in trying to contact pt and to look out for a response to mychart message.

## 2023-06-03 NOTE — Telephone Encounter (Signed)
 Called the pt and there was no answer- LMTCB

## 2023-06-04 ENCOUNTER — Ambulatory Visit: Admitting: Nurse Practitioner

## 2023-06-04 NOTE — Telephone Encounter (Signed)
Pt notified on VM. 

## 2023-06-05 ENCOUNTER — Ambulatory Visit (HOSPITAL_BASED_OUTPATIENT_CLINIC_OR_DEPARTMENT_OTHER): Admitting: Family Medicine

## 2023-06-10 ENCOUNTER — Ambulatory Visit: Admitting: Pulmonary Disease

## 2023-06-10 ENCOUNTER — Encounter: Payer: Self-pay | Admitting: Pulmonary Disease

## 2023-06-10 VITALS — BP 159/84 | HR 88 | Ht 64.0 in | Wt 143.0 lb

## 2023-06-10 DIAGNOSIS — J479 Bronchiectasis, uncomplicated: Secondary | ICD-10-CM | POA: Insufficient documentation

## 2023-06-10 DIAGNOSIS — J15212 Pneumonia due to Methicillin resistant Staphylococcus aureus: Secondary | ICD-10-CM

## 2023-06-10 DIAGNOSIS — Z87891 Personal history of nicotine dependence: Secondary | ICD-10-CM | POA: Diagnosis not present

## 2023-06-10 MED ORDER — ANORO ELLIPTA 62.5-25 MCG/ACT IN AEPB: 1.0000 | INHALATION_SPRAY | Freq: Every day | RESPIRATORY_TRACT | 3 refills | Status: DC

## 2023-06-10 NOTE — H&P (View-Only) (Signed)
 Synopsis: hx or MRSA pneumonia  Subjective:   PATIENT ID: Sabrina Roberson GENDER: female DOB: [birth year], MRN: [id number]  HPI  Chief Complaint  Patient presents with   Follow-up    Pt states ct's and bad cough , green sputum , chest presser  feels like someone is suffocating her    Sabrina Roberson is a 53 year old woman, former smoker with DMII, GERD, depression/anxiety, hypertension, CKD and asthma who returns to pulmonary clinic for MRSA pneumonia and bronchiectasis.  She was last seen by Canary Ceo, NP 03/17/23. Sputum cultures have been grown MRSA since 01/20/23. Most recent sputum sample collected 03/17/23. She was placed on a 14 day course of linezolid  starting on 03/20/23.   She continues to have productive cough with green sputum. She experiences cold chills but no fever. Recent dental work has led to difficulty swallowing, complicating chewing and swallowing.  She quit smoking 12/2022. She has been on disability.  Past Medical History:  Diagnosis Date   Abdominal pain 02/11/2011   Abnormal CT of the chest 02/04/2014   CT chest 08/2013:  atx in RML and lingula  CT angio 12/2013:  No PE, RML scarring, abnormal density in lingula  (done at [facility])     Allergy     Anxiety    Arthritis    Asthma    Bruises easily    Chronic chest wall pain    Chronic pain    Cough    Depression    Diabetes mellitus without complication (HCC)    Dyspnea    chronic   Dysuria 09/18/2022   Encounter for therapeutic drug monitoring 02/11/2011   Fibromyalgia    Functional movement disorder    GERD (gastroesophageal reflux disease)    Headache    Hearing loss    High cholesterol    History of hiatal hernia    Hypertension    Hypothyroidism    Kidney disease    stage 3   Leg swelling    both   Nausea 02/11/2011   Nausea and vomiting 04/01/2013   Pain management    PONV (postoperative nausea and vomiting)    Sebaceous cyst of breast, right subaereolar 10/30/2010   Septic shock  (HCC) 01/19/2023   SMAS (superior mesenteric artery syndrome) (HCC)    Thyroid  disease    TIA (transient ischemic attack)    Tic disorder    TMJ (dislocation of temporomandibular joint)    Transient cerebral ischemia    Type 2 diabetes mellitus with complications (HCC)      Family History  Problem Relation Age of Onset   Hypertension Sister    Asthma Father    Cancer Paternal Grandmother        breast   Heart disease Paternal Grandmother    Breast cancer Paternal Grandmother    Asthma Paternal Grandmother    Pulmonary fibrosis Maternal Grandmother    Emphysema Maternal Grandmother    Heart disease Maternal Grandmother    Breast cancer Maternal Grandmother    Leukemia Paternal Grandfather    Heart disease Paternal Grandfather    Clotting disorder Paternal Grandfather    Parkinson's disease Maternal Aunt    Asthma Maternal Grandfather    Heart disease Maternal Grandfather      Social History   Socioeconomic History   Marital status: Married    Spouse name: Not on file   Number of children: 2   Years of education: Not on file   Highest education level: 12th grade  Occupational History   Occupation: disabled  Tobacco Use   Smoking status: Former    Current packs/day: 0.00    Average packs/day: 0.5 packs/day for 15.0 years (7.5 ttl pk-yrs)    Types: Cigarettes    Quit date: 01/19/2023    Years since quitting: 0.3   Smokeless tobacco: Never  Vaping Use   Vaping status: Former  Substance and Sexual Activity   Alcohol use: No   Drug use: No   Sexual activity: Not Currently    Partners: Male    Birth control/protection: None  Other Topics Concern   Not on file  Social History Narrative   Not on file   Social Drivers of Health   Financial Resource Strain: Low Risk  (03/02/2023)   Overall Financial Resource Strain (CARDIA)    Difficulty of Paying Living Expenses: Not hard at all  Food Insecurity: No Food Insecurity (03/02/2023)   Hunger Vital Sign    Worried  About Running Out of Food in the Last Year: Never true    Ran Out of Food in the Last Year: Never true  Transportation Needs: No Transportation Needs (03/02/2023)   PRAPARE - Administrator, Civil Service (Medical): No    Lack of Transportation (Non-Medical): No  Physical Activity: Inactive (03/02/2023)   Exercise Vital Sign    Days of Exercise per Week: 0 days    Minutes of Exercise per Session: 20 min  Stress: Stress Concern Present (03/02/2023)   [employer] of Occupational Health - Occupational Stress Questionnaire    Feeling of Stress : Very much  Social Connections: Moderately Isolated (03/04/2023)   Social Connection and Isolation Panel [NHANES]    Frequency of Communication with Friends and Family: More than three times a week    Frequency of Social Gatherings with Friends and Family: More than three times a week    Attends Religious Services: Never    Database administrator or Organizations: No    Attends Banker Meetings: Never    Marital Status: Married  Catering manager Violence: Not At Risk (02/17/2023)   Humiliation, Afraid, Rape, and Kick questionnaire    Fear of Current or Ex-Partner: No    Emotionally Abused: No    Physically Abused: No    Sexually Abused: No     Allergies  Allergen Reactions   Latex Anaphylaxis and Swelling   Codeine Nausea And Vomiting    Other reaction(s): GI Upset (intolerance), Vomiting (intolerance)   Ibuprofen Other (See Comments)    GI upset, burning sensation   Oxycodone  Nausea And Vomiting    Pre-medication with phenergan  needed.   Tape Itching and Rash    Please use "paper" tape only.     Outpatient Medications Prior to Visit  Medication Sig Dispense Refill   albuterol  (VENTOLIN  HFA) 108 (90 Base) MCG/ACT inhaler Inhale 2 puffs into the lungs every 6 (six) hours as needed for wheezing or shortness of breath. 8 g 2   atorvastatin  (LIPITOR) 20 MG tablet Take 1 tablet (20 mg total) by mouth at bedtime. 90  tablet 3   buPROPion  ER (WELLBUTRIN  SR) 100 MG 12 hr tablet Take 1 tablet (100 mg total) by mouth daily. 90 tablet 3   diazepam  (VALIUM ) 5 MG tablet Take 1 tablet (5 mg total) by mouth 2 (two) times daily as needed for anxiety. 30 tablet 3   dicyclomine  (BENTYL ) 10 MG capsule Take 1 capsule (10 mg total) by mouth 4 (four) times daily -  before meals and at bedtime. (Patient taking differently: Take 10 mg by mouth 3 (three) times daily as needed for spasms.) 60 capsule 1   gabapentin  (NEURONTIN ) 600 MG tablet Take 1 tablet (600 mg total) by mouth at bedtime. 60 tablet 3   ipratropium-albuterol  (DUONEB) 0.5-2.5 (3) MG/3ML SOLN Take 3 mLs by nebulization every 4 (four) hours as needed (shortness of breath, wheezing, or cough). 360 mL 2   levothyroxine  (SYNTHROID ) 88 MCG tablet Take 1 tablet (88 mcg total) by mouth daily before breakfast. 30 tablet 3   metFORMIN  (GLUCOPHAGE -XR) 500 MG 24 hr tablet Take 1 tablet (500 mg total) by mouth daily with breakfast. 90 tablet 3   mirtazapine  (REMERON ) 15 MG tablet Take 1 tablet (15 mg total) by mouth at bedtime. 30 tablet 0   mupirocin  cream (BACTROBAN ) 2 % APPLY TO AFFECTED AREA TWICE A DAY 15 g 1   nystatin cream (MYCOSTATIN) Apply 1 Application topically 2 (two) times daily.     promethazine  (PHENERGAN ) 25 MG tablet Take 1 tablet (25 mg total) by mouth every 8 (eight) hours as needed for nausea or vomiting. 30 tablet 3   propranolol  (INDERAL ) 80 MG tablet Take 1 tablet (80 mg total) by mouth daily. 90 tablet 3   sodium chloride  HYPERTONIC 3 % nebulizer solution Take by nebulization as needed for cough. Use 3 mL Twice daily until symptoms improve 75 mL 5   traZODone  (DESYREL ) 100 MG tablet TAKE 1 TABLET BY MOUTH EVERYDAY AT BEDTIME 30 tablet 3   furosemide  (LASIX ) 20 MG tablet Take 1 tablet (20 mg total) by mouth daily for 5 days. 30 tablet 3   pantoprazole  (PROTONIX ) 40 MG tablet Take 1 tablet (40 mg total) by mouth daily. 30 tablet 0   No  facility-administered medications prior to visit.    Review of Systems  Constitutional:  Positive for chills. Negative for fever, malaise/fatigue and weight loss.  HENT:  Negative for congestion, sinus pain and sore throat.   Eyes: Negative.   Respiratory:  Positive for cough, sputum production and shortness of breath. Negative for hemoptysis and wheezing.   Cardiovascular:  Negative for chest pain, palpitations, orthopnea, claudication and leg swelling.  Gastrointestinal:  Negative for abdominal pain, heartburn, nausea and vomiting.  Genitourinary: Negative.   Musculoskeletal:  Negative for joint pain and myalgias.  Skin:  Negative for rash.  Neurological:  Negative for weakness.  Endo/Heme/Allergies: Negative.   Psychiatric/Behavioral: Negative.        Objective:   Vitals:   06/10/23 1544  BP: (!) 159/84  Pulse: 88  SpO2: 96%  Weight: 143 lb (64.9 kg)  Height: 5\' 4"  (1.626 m)     Physical Exam Constitutional:      General: She is not in acute distress.    Appearance: Normal appearance.  Eyes:     General: No scleral icterus.    Conjunctiva/sclera: Conjunctivae normal.  Cardiovascular:     Rate and Rhythm: Normal rate and regular rhythm.  Pulmonary:     Breath sounds: Wheezing (mild, right upper lung field - anterior) present. No rhonchi or rales.  Musculoskeletal:     Right lower leg: No edema.     Left lower leg: No edema.  Skin:    General: Skin is warm and dry.  Neurological:     General: No focal deficit present.       CBC    Component Value Date/Time   WBC 9.5 03/04/2023 1506   WBC 9.3 02/05/2023 0323  RBC 3.65 (L) 03/04/2023 1506   RBC 2.74 (L) 02/05/2023 0323   HGB 10.8 (L) 03/04/2023 1506   HCT 34.0 03/04/2023 1506   PLT 363 03/04/2023 1506   MCV 93 03/04/2023 1506   MCH 29.6 03/04/2023 1506   MCH 29.2 02/05/2023 0323   MCHC 31.8 03/04/2023 1506   MCHC 30.2 02/05/2023 0323   RDW 14.5 03/04/2023 1506   LYMPHSABS 2.2 03/04/2023 1506    MONOABS 0.4 01/24/2023 0550   EOSABS 0.2 03/04/2023 1506   BASOSABS 0.0 03/04/2023 1506     Chest imaging: CT Chest 05/16/23 1. New 2.3 x 1.6 cm masslike abnormality in the azygoesophageal recess area, could indicate an area of new right lower lobe pulmonary consolidation, mass or an abnormal lymph node. Per Fleischner Society guidelines, recommend tissue sampling or PET-CT. This the recommendation follows the consensus statement: Guidelines for Management of Incidental Pulmonary Nodules Detected on CT Images: From the Fleischner Society 2017; Radiology 2017; 284:228-243. 2. New patchy ground-glass disease in the medial basal right lower lobe, probably due to pneumonitis. 3. New loosely clustered nodules in the posterior basal left lower lobe ranging from 3 mm up to 8 mm in size. Given the short interval appearance, these are probably inflammatory nodules but will require follow-up. Non-contrast chest CT at 3-6 months is recommended. If the nodules are stable at time of repeat CT, then future CT at 18-24 months (from today's scan) is considered optional for low-risk patients, but is recommended for high-risk patients. This recommendation follows the consensus statement: Guidelines for Management of Incidental Pulmonary Nodules Detected on CT Images: From the Fleischner Society 2017; Radiology 2017; 284:228-243. 4. Diffuse bronchial thickening, mild bronchiectasis in the lower lobes and in the perihilar areas in the upper lobes. Bilateral bandlike opacities most likely scarring change. 5. Faint scattered ground-glass opacities in areas of previous dense consolidation, most likely representing ground-glass postpneumonic fibrosis. 6. Prominent pulmonary trunk indicating arterial hypertension. 7. Aortic atherosclerosis. 8. Osteopenia.  PFT:    Latest Ref Rng & Units 12/28/2013   12:53 PM 12/01/2013   12:06 PM  PFT Results  FVC-Pre L 3.44  2.60   FVC-Predicted Pre % 93  70    FVC-Post L 3.21    FVC-Predicted Post % 87    Pre FEV1/FVC % % 81  87   Post FEV1/FCV % % 81    FEV1-Pre L 2.79  2.26   FEV1-Predicted Pre % 93  75   FEV1-Post L 2.61      Labs:  Path:  Echo:  Heart Catheterization:       Assessment & Plan:   Bronchiectasis without complication (HCC) - Plan: umeclidinium-vilanterol (ANORO ELLIPTA) 62.5-25 MCG/ACT AEPB  Pneumonia due to methicillin resistant Staphylococcus aureus (MRSA), unspecified laterality, unspecified part of lung (HCC) - Plan: Respiratory or Resp and Sputum Culture  Discussion: Sabrina Roberson is a 53 year old woman, former smoker with DMII, GERD, depression/anxiety, hypertension, CKD and asthma who returns to pulmonary clinic for MRSA pneumonia and bronchiectasis.  Pneumonia with MRSA Persistent MRSA pneumonia with right lung scarring and new right middle lobe consolidation. Differential includes infection, inflammation, or obstruction. Likely infection-related opacity. Bronchoscopy planned for further evaluation. - Schedule bronchoscopy for Jun 27, 2023, at [facility]. - Order sputum culture. - Patient may require prologned treatment course with oral linezolid  vs PICC line for IV antibiotics. Will discuss further with infectious disease after bronchoscopy. - Instruct to use hypertonic saline nebulizer twice daily and flutter valve for mucus clearance.  Bronchiectasis - Prescribe Anoro inhaler, one puff daily, to avoid inhaled steroids.  Follow up in 1 month  Duaine German, MD [facility] Office: [phone]   Current Outpatient Medications:    albuterol  (VENTOLIN  HFA) 108 (90 Base) MCG/ACT inhaler, Inhale 2 puffs into the lungs every 6 (six) hours as needed for wheezing or shortness of breath., Disp: 8 g, Rfl: 2   atorvastatin  (LIPITOR) 20 MG tablet, Take 1 tablet (20 mg total) by mouth at bedtime., Disp: 90 tablet, Rfl: 3   buPROPion  ER (WELLBUTRIN  SR) 100 MG 12 hr tablet, Take  1 tablet (100 mg total) by mouth daily., Disp: 90 tablet, Rfl: 3   diazepam  (VALIUM ) 5 MG tablet, Take 1 tablet (5 mg total) by mouth 2 (two) times daily as needed for anxiety., Disp: 30 tablet, Rfl: 3   dicyclomine  (BENTYL ) 10 MG capsule, Take 1 capsule (10 mg total) by mouth 4 (four) times daily -  before meals and at bedtime. (Patient taking differently: Take 10 mg by mouth 3 (three) times daily as needed for spasms.), Disp: 60 capsule, Rfl: 1   gabapentin  (NEURONTIN ) 600 MG tablet, Take 1 tablet (600 mg total) by mouth at bedtime., Disp: 60 tablet, Rfl: 3   ipratropium-albuterol  (DUONEB) 0.5-2.5 (3) MG/3ML SOLN, Take 3 mLs by nebulization every 4 (four) hours as needed (shortness of breath, wheezing, or cough)., Disp: 360 mL, Rfl: 2   levothyroxine  (SYNTHROID ) 88 MCG tablet, Take 1 tablet (88 mcg total) by mouth daily before breakfast., Disp: 30 tablet, Rfl: 3   metFORMIN  (GLUCOPHAGE -XR) 500 MG 24 hr tablet, Take 1 tablet (500 mg total) by mouth daily with breakfast., Disp: 90 tablet, Rfl: 3   mirtazapine  (REMERON ) 15 MG tablet, Take 1 tablet (15 mg total) by mouth at bedtime., Disp: 30 tablet, Rfl: 0   mupirocin  cream (BACTROBAN ) 2 %, APPLY TO AFFECTED AREA TWICE A DAY, Disp: 15 g, Rfl: 1   nystatin cream (MYCOSTATIN), Apply 1 Application topically 2 (two) times daily., Disp: , Rfl:    promethazine  (PHENERGAN ) 25 MG tablet, Take 1 tablet (25 mg total) by mouth every 8 (eight) hours as needed for nausea or vomiting., Disp: 30 tablet, Rfl: 3   propranolol  (INDERAL ) 80 MG tablet, Take 1 tablet (80 mg total) by mouth daily., Disp: 90 tablet, Rfl: 3   sodium chloride  HYPERTONIC 3 % nebulizer solution, Take by nebulization as needed for cough. Use 3 mL Twice daily until symptoms improve, Disp: 75 mL, Rfl: 5   traZODone  (DESYREL ) 100 MG tablet, TAKE 1 TABLET BY MOUTH EVERYDAY AT BEDTIME, Disp: 30 tablet, Rfl: 3   umeclidinium-vilanterol (ANORO ELLIPTA) 62.5-25 MCG/ACT AEPB, Inhale 1 puff into the lungs  daily., Disp: 60 each, Rfl: 3   furosemide  (LASIX ) 20 MG tablet, Take 1 tablet (20 mg total) by mouth daily for 5 days., Disp: 30 tablet, Rfl: 3   pantoprazole  (PROTONIX ) 40 MG tablet, Take 1 tablet (40 mg total) by mouth daily., Disp: 30 tablet, Rfl: 0

## 2023-06-10 NOTE — Progress Notes (Signed)
 Synopsis: hx or MRSA pneumonia  Subjective:   PATIENT ID: Sabrina Roberson GENDER: female DOB: [birth year], MRN: [id number]  HPI  Chief Complaint  Patient presents with   Follow-up    Pt states ct's and bad cough , green sputum , chest presser  feels like someone is suffocating her    Sabrina Roberson is a 53 year old woman, former smoker with DMII, GERD, depression/anxiety, hypertension, CKD and asthma who returns to pulmonary clinic for MRSA pneumonia and bronchiectasis.  She was last seen by Canary Ceo, NP 03/17/23. Sputum cultures have been grown MRSA since 01/20/23. Most recent sputum sample collected 03/17/23. She was placed on a 14 day course of linezolid  starting on 03/20/23.   She continues to have productive cough with green sputum. She experiences cold chills but no fever. Recent dental work has led to difficulty swallowing, complicating chewing and swallowing.  She quit smoking 12/2022. She has been on disability.  Past Medical History:  Diagnosis Date   Abdominal pain 02/11/2011   Abnormal CT of the chest 02/04/2014   CT chest 08/2013:  atx in RML and lingula  CT angio 12/2013:  No PE, RML scarring, abnormal density in lingula  (done at [facility])     Allergy     Anxiety    Arthritis    Asthma    Bruises easily    Chronic chest wall pain    Chronic pain    Cough    Depression    Diabetes mellitus without complication (HCC)    Dyspnea    chronic   Dysuria 09/18/2022   Encounter for therapeutic drug monitoring 02/11/2011   Fibromyalgia    Functional movement disorder    GERD (gastroesophageal reflux disease)    Headache    Hearing loss    High cholesterol    History of hiatal hernia    Hypertension    Hypothyroidism    Kidney disease    stage 3   Leg swelling    both   Nausea 02/11/2011   Nausea and vomiting 04/01/2013   Pain management    PONV (postoperative nausea and vomiting)    Sebaceous cyst of breast, right subaereolar 10/30/2010   Septic shock  (HCC) 01/19/2023   SMAS (superior mesenteric artery syndrome) (HCC)    Thyroid  disease    TIA (transient ischemic attack)    Tic disorder    TMJ (dislocation of temporomandibular joint)    Transient cerebral ischemia    Type 2 diabetes mellitus with complications (HCC)      Family History  Problem Relation Age of Onset   Hypertension Sister    Asthma Father    Cancer Paternal Grandmother        breast   Heart disease Paternal Grandmother    Breast cancer Paternal Grandmother    Asthma Paternal Grandmother    Pulmonary fibrosis Maternal Grandmother    Emphysema Maternal Grandmother    Heart disease Maternal Grandmother    Breast cancer Maternal Grandmother    Leukemia Paternal Grandfather    Heart disease Paternal Grandfather    Clotting disorder Paternal Grandfather    Parkinson's disease Maternal Aunt    Asthma Maternal Grandfather    Heart disease Maternal Grandfather      Social History   Socioeconomic History   Marital status: Married    Spouse name: Not on file   Number of children: 2   Years of education: Not on file   Highest education level: 12th grade  Occupational History   Occupation: disabled  Tobacco Use   Smoking status: Former    Current packs/day: 0.00    Average packs/day: 0.5 packs/day for 15.0 years (7.5 ttl pk-yrs)    Types: Cigarettes    Quit date: 01/19/2023    Years since quitting: 0.3   Smokeless tobacco: Never  Vaping Use   Vaping status: Former  Substance and Sexual Activity   Alcohol use: No   Drug use: No   Sexual activity: Not Currently    Partners: Male    Birth control/protection: None  Other Topics Concern   Not on file  Social History Narrative   Not on file   Social Drivers of Health   Financial Resource Strain: Low Risk  (03/02/2023)   Overall Financial Resource Strain (CARDIA)    Difficulty of Paying Living Expenses: Not hard at all  Food Insecurity: No Food Insecurity (03/02/2023)   Hunger Vital Sign    Worried  About Running Out of Food in the Last Year: Never true    Ran Out of Food in the Last Year: Never true  Transportation Needs: No Transportation Needs (03/02/2023)   PRAPARE - Administrator, Civil Service (Medical): No    Lack of Transportation (Non-Medical): No  Physical Activity: Inactive (03/02/2023)   Exercise Vital Sign    Days of Exercise per Week: 0 days    Minutes of Exercise per Session: 20 min  Stress: Stress Concern Present (03/02/2023)   [employer] of Occupational Health - Occupational Stress Questionnaire    Feeling of Stress : Very much  Social Connections: Moderately Isolated (03/04/2023)   Social Connection and Isolation Panel [NHANES]    Frequency of Communication with Friends and Family: More than three times a week    Frequency of Social Gatherings with Friends and Family: More than three times a week    Attends Religious Services: Never    Database administrator or Organizations: No    Attends Banker Meetings: Never    Marital Status: Married  Catering manager Violence: Not At Risk (02/17/2023)   Humiliation, Afraid, Rape, and Kick questionnaire    Fear of Current or Ex-Partner: No    Emotionally Abused: No    Physically Abused: No    Sexually Abused: No     Allergies  Allergen Reactions   Latex Anaphylaxis and Swelling   Codeine Nausea And Vomiting    Other reaction(s): GI Upset (intolerance), Vomiting (intolerance)   Ibuprofen Other (See Comments)    GI upset, burning sensation   Oxycodone  Nausea And Vomiting    Pre-medication with phenergan  needed.   Tape Itching and Rash    Please use "paper" tape only.     Outpatient Medications Prior to Visit  Medication Sig Dispense Refill   albuterol  (VENTOLIN  HFA) 108 (90 Base) MCG/ACT inhaler Inhale 2 puffs into the lungs every 6 (six) hours as needed for wheezing or shortness of breath. 8 g 2   atorvastatin  (LIPITOR) 20 MG tablet Take 1 tablet (20 mg total) by mouth at bedtime. 90  tablet 3   buPROPion  ER (WELLBUTRIN  SR) 100 MG 12 hr tablet Take 1 tablet (100 mg total) by mouth daily. 90 tablet 3   diazepam  (VALIUM ) 5 MG tablet Take 1 tablet (5 mg total) by mouth 2 (two) times daily as needed for anxiety. 30 tablet 3   dicyclomine  (BENTYL ) 10 MG capsule Take 1 capsule (10 mg total) by mouth 4 (four) times daily -  before meals and at bedtime. (Patient taking differently: Take 10 mg by mouth 3 (three) times daily as needed for spasms.) 60 capsule 1   gabapentin  (NEURONTIN ) 600 MG tablet Take 1 tablet (600 mg total) by mouth at bedtime. 60 tablet 3   ipratropium-albuterol  (DUONEB) 0.5-2.5 (3) MG/3ML SOLN Take 3 mLs by nebulization every 4 (four) hours as needed (shortness of breath, wheezing, or cough). 360 mL 2   levothyroxine  (SYNTHROID ) 88 MCG tablet Take 1 tablet (88 mcg total) by mouth daily before breakfast. 30 tablet 3   metFORMIN  (GLUCOPHAGE -XR) 500 MG 24 hr tablet Take 1 tablet (500 mg total) by mouth daily with breakfast. 90 tablet 3   mirtazapine  (REMERON ) 15 MG tablet Take 1 tablet (15 mg total) by mouth at bedtime. 30 tablet 0   mupirocin  cream (BACTROBAN ) 2 % APPLY TO AFFECTED AREA TWICE A DAY 15 g 1   nystatin cream (MYCOSTATIN) Apply 1 Application topically 2 (two) times daily.     promethazine  (PHENERGAN ) 25 MG tablet Take 1 tablet (25 mg total) by mouth every 8 (eight) hours as needed for nausea or vomiting. 30 tablet 3   propranolol  (INDERAL ) 80 MG tablet Take 1 tablet (80 mg total) by mouth daily. 90 tablet 3   sodium chloride  HYPERTONIC 3 % nebulizer solution Take by nebulization as needed for cough. Use 3 mL Twice daily until symptoms improve 75 mL 5   traZODone  (DESYREL ) 100 MG tablet TAKE 1 TABLET BY MOUTH EVERYDAY AT BEDTIME 30 tablet 3   furosemide  (LASIX ) 20 MG tablet Take 1 tablet (20 mg total) by mouth daily for 5 days. 30 tablet 3   pantoprazole  (PROTONIX ) 40 MG tablet Take 1 tablet (40 mg total) by mouth daily. 30 tablet 0   No  facility-administered medications prior to visit.    Review of Systems  Constitutional:  Positive for chills. Negative for fever, malaise/fatigue and weight loss.  HENT:  Negative for congestion, sinus pain and sore throat.   Eyes: Negative.   Respiratory:  Positive for cough, sputum production and shortness of breath. Negative for hemoptysis and wheezing.   Cardiovascular:  Negative for chest pain, palpitations, orthopnea, claudication and leg swelling.  Gastrointestinal:  Negative for abdominal pain, heartburn, nausea and vomiting.  Genitourinary: Negative.   Musculoskeletal:  Negative for joint pain and myalgias.  Skin:  Negative for rash.  Neurological:  Negative for weakness.  Endo/Heme/Allergies: Negative.   Psychiatric/Behavioral: Negative.        Objective:   Vitals:   06/10/23 1544  BP: (!) 159/84  Pulse: 88  SpO2: 96%  Weight: 143 lb (64.9 kg)  Height: 5\' 4"  (1.626 m)     Physical Exam Constitutional:      General: She is not in acute distress.    Appearance: Normal appearance.  Eyes:     General: No scleral icterus.    Conjunctiva/sclera: Conjunctivae normal.  Cardiovascular:     Rate and Rhythm: Normal rate and regular rhythm.  Pulmonary:     Breath sounds: Wheezing (mild, right upper lung field - anterior) present. No rhonchi or rales.  Musculoskeletal:     Right lower leg: No edema.     Left lower leg: No edema.  Skin:    General: Skin is warm and dry.  Neurological:     General: No focal deficit present.       CBC    Component Value Date/Time   WBC 9.5 03/04/2023 1506   WBC 9.3 02/05/2023 0323  RBC 3.65 (L) 03/04/2023 1506   RBC 2.74 (L) 02/05/2023 0323   HGB 10.8 (L) 03/04/2023 1506   HCT 34.0 03/04/2023 1506   PLT 363 03/04/2023 1506   MCV 93 03/04/2023 1506   MCH 29.6 03/04/2023 1506   MCH 29.2 02/05/2023 0323   MCHC 31.8 03/04/2023 1506   MCHC 30.2 02/05/2023 0323   RDW 14.5 03/04/2023 1506   LYMPHSABS 2.2 03/04/2023 1506    MONOABS 0.4 01/24/2023 0550   EOSABS 0.2 03/04/2023 1506   BASOSABS 0.0 03/04/2023 1506     Chest imaging: CT Chest 05/16/23 1. New 2.3 x 1.6 cm masslike abnormality in the azygoesophageal recess area, could indicate an area of new right lower lobe pulmonary consolidation, mass or an abnormal lymph node. Per Fleischner Society guidelines, recommend tissue sampling or PET-CT. This the recommendation follows the consensus statement: Guidelines for Management of Incidental Pulmonary Nodules Detected on CT Images: From the Fleischner Society 2017; Radiology 2017; 284:228-243. 2. New patchy ground-glass disease in the medial basal right lower lobe, probably due to pneumonitis. 3. New loosely clustered nodules in the posterior basal left lower lobe ranging from 3 mm up to 8 mm in size. Given the short interval appearance, these are probably inflammatory nodules but will require follow-up. Non-contrast chest CT at 3-6 months is recommended. If the nodules are stable at time of repeat CT, then future CT at 18-24 months (from today's scan) is considered optional for low-risk patients, but is recommended for high-risk patients. This recommendation follows the consensus statement: Guidelines for Management of Incidental Pulmonary Nodules Detected on CT Images: From the Fleischner Society 2017; Radiology 2017; 284:228-243. 4. Diffuse bronchial thickening, mild bronchiectasis in the lower lobes and in the perihilar areas in the upper lobes. Bilateral bandlike opacities most likely scarring change. 5. Faint scattered ground-glass opacities in areas of previous dense consolidation, most likely representing ground-glass postpneumonic fibrosis. 6. Prominent pulmonary trunk indicating arterial hypertension. 7. Aortic atherosclerosis. 8. Osteopenia.  PFT:    Latest Ref Rng & Units 12/28/2013   12:53 PM 12/01/2013   12:06 PM  PFT Results  FVC-Pre L 3.44  2.60   FVC-Predicted Pre % 93  70    FVC-Post L 3.21    FVC-Predicted Post % 87    Pre FEV1/FVC % % 81  87   Post FEV1/FCV % % 81    FEV1-Pre L 2.79  2.26   FEV1-Predicted Pre % 93  75   FEV1-Post L 2.61      Labs:  Path:  Echo:  Heart Catheterization:       Assessment & Plan:   Bronchiectasis without complication (HCC) - Plan: umeclidinium-vilanterol (ANORO ELLIPTA) 62.5-25 MCG/ACT AEPB  Pneumonia due to methicillin resistant Staphylococcus aureus (MRSA), unspecified laterality, unspecified part of lung (HCC) - Plan: Respiratory or Resp and Sputum Culture  Discussion: Angelli Bargo is a 53 year old woman, former smoker with DMII, GERD, depression/anxiety, hypertension, CKD and asthma who returns to pulmonary clinic for MRSA pneumonia and bronchiectasis.  Pneumonia with MRSA Persistent MRSA pneumonia with right lung scarring and new right middle lobe consolidation. Differential includes infection, inflammation, or obstruction. Likely infection-related opacity. Bronchoscopy planned for further evaluation. - Schedule bronchoscopy for Jun 27, 2023, at [facility]. - Order sputum culture. - Patient may require prologned treatment course with oral linezolid  vs PICC line for IV antibiotics. Will discuss further with infectious disease after bronchoscopy. - Instruct to use hypertonic saline nebulizer twice daily and flutter valve for mucus clearance.  Bronchiectasis - Prescribe Anoro inhaler, one puff daily, to avoid inhaled steroids.  Follow up in 1 month  Duaine German, MD [facility] Office: [phone]   Current Outpatient Medications:    albuterol  (VENTOLIN  HFA) 108 (90 Base) MCG/ACT inhaler, Inhale 2 puffs into the lungs every 6 (six) hours as needed for wheezing or shortness of breath., Disp: 8 g, Rfl: 2   atorvastatin  (LIPITOR) 20 MG tablet, Take 1 tablet (20 mg total) by mouth at bedtime., Disp: 90 tablet, Rfl: 3   buPROPion  ER (WELLBUTRIN  SR) 100 MG 12 hr tablet, Take  1 tablet (100 mg total) by mouth daily., Disp: 90 tablet, Rfl: 3   diazepam  (VALIUM ) 5 MG tablet, Take 1 tablet (5 mg total) by mouth 2 (two) times daily as needed for anxiety., Disp: 30 tablet, Rfl: 3   dicyclomine  (BENTYL ) 10 MG capsule, Take 1 capsule (10 mg total) by mouth 4 (four) times daily -  before meals and at bedtime. (Patient taking differently: Take 10 mg by mouth 3 (three) times daily as needed for spasms.), Disp: 60 capsule, Rfl: 1   gabapentin  (NEURONTIN ) 600 MG tablet, Take 1 tablet (600 mg total) by mouth at bedtime., Disp: 60 tablet, Rfl: 3   ipratropium-albuterol  (DUONEB) 0.5-2.5 (3) MG/3ML SOLN, Take 3 mLs by nebulization every 4 (four) hours as needed (shortness of breath, wheezing, or cough)., Disp: 360 mL, Rfl: 2   levothyroxine  (SYNTHROID ) 88 MCG tablet, Take 1 tablet (88 mcg total) by mouth daily before breakfast., Disp: 30 tablet, Rfl: 3   metFORMIN  (GLUCOPHAGE -XR) 500 MG 24 hr tablet, Take 1 tablet (500 mg total) by mouth daily with breakfast., Disp: 90 tablet, Rfl: 3   mirtazapine  (REMERON ) 15 MG tablet, Take 1 tablet (15 mg total) by mouth at bedtime., Disp: 30 tablet, Rfl: 0   mupirocin  cream (BACTROBAN ) 2 %, APPLY TO AFFECTED AREA TWICE A DAY, Disp: 15 g, Rfl: 1   nystatin cream (MYCOSTATIN), Apply 1 Application topically 2 (two) times daily., Disp: , Rfl:    promethazine  (PHENERGAN ) 25 MG tablet, Take 1 tablet (25 mg total) by mouth every 8 (eight) hours as needed for nausea or vomiting., Disp: 30 tablet, Rfl: 3   propranolol  (INDERAL ) 80 MG tablet, Take 1 tablet (80 mg total) by mouth daily., Disp: 90 tablet, Rfl: 3   sodium chloride  HYPERTONIC 3 % nebulizer solution, Take by nebulization as needed for cough. Use 3 mL Twice daily until symptoms improve, Disp: 75 mL, Rfl: 5   traZODone  (DESYREL ) 100 MG tablet, TAKE 1 TABLET BY MOUTH EVERYDAY AT BEDTIME, Disp: 30 tablet, Rfl: 3   umeclidinium-vilanterol (ANORO ELLIPTA) 62.5-25 MCG/ACT AEPB, Inhale 1 puff into the lungs  daily., Disp: 60 each, Rfl: 3   furosemide  (LASIX ) 20 MG tablet, Take 1 tablet (20 mg total) by mouth daily for 5 days., Disp: 30 tablet, Rfl: 3   pantoprazole  (PROTONIX ) 40 MG tablet, Take 1 tablet (40 mg total) by mouth daily., Disp: 30 tablet, Rfl: 0

## 2023-06-10 NOTE — Patient Instructions (Addendum)
 We will check another sputum sample today  I will work on getting you scheduled for bronchoscopy on 5/30  Resume hypertonic saline nebs 2-3 times per day followed by flutter valve to help clear out the mucous  Start anoro ellipta inhaler 1 puff daily  Use duoneb or albuterol  as needed  Follow up in 1 month

## 2023-06-13 ENCOUNTER — Encounter: Payer: Self-pay | Admitting: Pulmonary Disease

## 2023-06-13 LAB — RESPIRATORY CULTURE OR RESPIRATORY AND SPUTUM CULTURE
MICRO NUMBER:: 16448905
RESULT:: NORMAL
SPECIMEN QUALITY:: ADEQUATE

## 2023-06-13 NOTE — Addendum Note (Signed)
 Addended by: Berea Majkowski on: 06/13/2023 01:34 PM   Modules accepted: Orders

## 2023-06-18 ENCOUNTER — Encounter (HOSPITAL_BASED_OUTPATIENT_CLINIC_OR_DEPARTMENT_OTHER): Payer: Self-pay | Admitting: Family Medicine

## 2023-06-18 ENCOUNTER — Other Ambulatory Visit (HOSPITAL_BASED_OUTPATIENT_CLINIC_OR_DEPARTMENT_OTHER): Payer: Self-pay | Admitting: Family Medicine

## 2023-06-18 DIAGNOSIS — F411 Generalized anxiety disorder: Secondary | ICD-10-CM

## 2023-06-19 ENCOUNTER — Ambulatory Visit: Admitting: Nurse Practitioner

## 2023-06-26 ENCOUNTER — Other Ambulatory Visit (HOSPITAL_BASED_OUTPATIENT_CLINIC_OR_DEPARTMENT_OTHER): Payer: Self-pay | Admitting: Family Medicine

## 2023-06-27 ENCOUNTER — Ambulatory Visit (HOSPITAL_COMMUNITY): Admitting: Vascular Surgery

## 2023-06-27 ENCOUNTER — Encounter (HOSPITAL_COMMUNITY)
Admission: RE | Disposition: A | Discharge status: Home / Self Care | Payer: Self-pay | Source: Home / Self Care | Attending: Pulmonary Disease

## 2023-06-27 ENCOUNTER — Ambulatory Visit (HOSPITAL_COMMUNITY)

## 2023-06-27 ENCOUNTER — Ambulatory Visit (HOSPITAL_BASED_OUTPATIENT_CLINIC_OR_DEPARTMENT_OTHER): Admitting: Vascular Surgery

## 2023-06-27 ENCOUNTER — Encounter (HOSPITAL_COMMUNITY): Payer: Self-pay | Admitting: Pulmonary Disease

## 2023-06-27 ENCOUNTER — Ambulatory Visit (HOSPITAL_COMMUNITY)
Admission: RE | Admit: 2023-06-27 | Discharge: 2023-06-27 | Disposition: A | Discharge status: Home / Self Care | Attending: Pulmonary Disease | Admitting: Pulmonary Disease

## 2023-06-27 ENCOUNTER — Other Ambulatory Visit: Payer: Self-pay

## 2023-06-27 DIAGNOSIS — K219 Gastro-esophageal reflux disease without esophagitis: Secondary | ICD-10-CM | POA: Insufficient documentation

## 2023-06-27 DIAGNOSIS — I129 Hypertensive chronic kidney disease with stage 1 through stage 4 chronic kidney disease, or unspecified chronic kidney disease: Secondary | ICD-10-CM | POA: Insufficient documentation

## 2023-06-27 DIAGNOSIS — G709 Myoneural disorder, unspecified: Secondary | ICD-10-CM | POA: Diagnosis not present

## 2023-06-27 DIAGNOSIS — K449 Diaphragmatic hernia without obstruction or gangrene: Secondary | ICD-10-CM | POA: Diagnosis not present

## 2023-06-27 DIAGNOSIS — E1122 Type 2 diabetes mellitus with diabetic chronic kidney disease: Secondary | ICD-10-CM | POA: Diagnosis not present

## 2023-06-27 DIAGNOSIS — M1990 Unspecified osteoarthritis, unspecified site: Secondary | ICD-10-CM | POA: Diagnosis not present

## 2023-06-27 DIAGNOSIS — J479 Bronchiectasis, uncomplicated: Secondary | ICD-10-CM | POA: Diagnosis present

## 2023-06-27 DIAGNOSIS — N189 Chronic kidney disease, unspecified: Secondary | ICD-10-CM | POA: Insufficient documentation

## 2023-06-27 DIAGNOSIS — E039 Hypothyroidism, unspecified: Secondary | ICD-10-CM | POA: Diagnosis not present

## 2023-06-27 DIAGNOSIS — J189 Pneumonia, unspecified organism: Secondary | ICD-10-CM | POA: Diagnosis not present

## 2023-06-27 DIAGNOSIS — Z8673 Personal history of transient ischemic attack (TIA), and cerebral infarction without residual deficits: Secondary | ICD-10-CM | POA: Insufficient documentation

## 2023-06-27 DIAGNOSIS — Z87891 Personal history of nicotine dependence: Secondary | ICD-10-CM | POA: Insufficient documentation

## 2023-06-27 DIAGNOSIS — J15212 Pneumonia due to Methicillin resistant Staphylococcus aureus: Secondary | ICD-10-CM | POA: Diagnosis present

## 2023-06-27 DIAGNOSIS — F418 Other specified anxiety disorders: Secondary | ICD-10-CM | POA: Insufficient documentation

## 2023-06-27 DIAGNOSIS — J45909 Unspecified asthma, uncomplicated: Secondary | ICD-10-CM | POA: Insufficient documentation

## 2023-06-27 HISTORY — PX: FLEXIBLE BRONCHOSCOPY: SHX5094

## 2023-06-27 LAB — BODY FLUID CELL COUNT WITH DIFFERENTIAL
Eos, Fluid: 4 %
Lymphs, Fluid: 6 %
Monocyte-Macrophage-Serous Fluid: 27 % — ABNORMAL LOW (ref 50–90)
Neutrophil Count, Fluid: 63 % — ABNORMAL HIGH (ref 0–25)
Total Nucleated Cell Count, Fluid: 200 uL (ref 0–1000)

## 2023-06-27 LAB — GLUCOSE, CAPILLARY: Glucose-Capillary: 76 mg/dL (ref 70–99)

## 2023-06-27 SURGERY — BRONCHOSCOPY, FLEXIBLE
Anesthesia: General | Laterality: Bilateral

## 2023-06-27 MED ORDER — PROPOFOL 10 MG/ML IV BOLUS
INTRAVENOUS | Status: DC | PRN
Start: 2023-06-27 — End: 2023-06-27
  Administered 2023-06-27: 150 mg via INTRAVENOUS

## 2023-06-27 MED ORDER — FENTANYL CITRATE (PF) 250 MCG/5ML IJ SOLN
INTRAMUSCULAR | Status: DC | PRN
  Administered 2023-06-27 (×2): 50 ug via INTRAVENOUS

## 2023-06-27 MED ORDER — SODIUM CHLORIDE 0.9 % IV SOLN: INTRAVENOUS | Status: DC | PRN

## 2023-06-27 MED ORDER — MIDAZOLAM HCL 2 MG/2ML IJ SOLN
INTRAMUSCULAR | Status: AC
  Filled 2023-06-27: qty 2

## 2023-06-27 MED ORDER — SODIUM CHLORIDE 0.9 % IV SOLN
INTRAVENOUS | Status: AC | PRN
  Administered 2023-06-27: 500 mL via INTRAMUSCULAR

## 2023-06-27 MED ORDER — DEXAMETHASONE SODIUM PHOSPHATE 10 MG/ML IJ SOLN
INTRAMUSCULAR | Status: DC | PRN
  Administered 2023-06-27: 5 mg via INTRAVENOUS

## 2023-06-27 MED ORDER — MIDAZOLAM HCL 2 MG/2ML IJ SOLN
INTRAMUSCULAR | Status: DC | PRN
  Administered 2023-06-27: 2 mg via INTRAVENOUS

## 2023-06-27 MED ORDER — FENTANYL CITRATE (PF) 100 MCG/2ML IJ SOLN
INTRAMUSCULAR | Status: AC
  Filled 2023-06-27: qty 2

## 2023-06-27 MED ORDER — SUGAMMADEX SODIUM 200 MG/2ML IV SOLN
INTRAVENOUS | Status: DC | PRN
  Administered 2023-06-27: 200 mg via INTRAVENOUS

## 2023-06-27 MED ORDER — LIDOCAINE 2% (20 MG/ML) 5 ML SYRINGE
INTRAMUSCULAR | Status: DC | PRN
  Administered 2023-06-27: 80 mg via INTRAVENOUS

## 2023-06-27 MED ORDER — PROPOFOL 500 MG/50ML IV EMUL
INTRAVENOUS | Status: DC | PRN
Start: 2023-06-27 — End: 2023-06-27
  Administered 2023-06-27: 100 ug/kg/min via INTRAVENOUS

## 2023-06-27 MED ORDER — PHENYLEPHRINE HCL-NACL 20-0.9 MG/250ML-% IV SOLN
INTRAVENOUS | Status: DC | PRN
  Administered 2023-06-27: 80 ug via INTRAVENOUS

## 2023-06-27 MED ORDER — ONDANSETRON HCL 4 MG/2ML IJ SOLN
INTRAMUSCULAR | Status: DC | PRN
  Administered 2023-06-27: 4 mg via INTRAVENOUS

## 2023-06-27 MED ORDER — ROCURONIUM BROMIDE 10 MG/ML (PF) SYRINGE
PREFILLED_SYRINGE | INTRAVENOUS | Status: DC | PRN
  Administered 2023-06-27: 50 mg via INTRAVENOUS

## 2023-06-27 NOTE — Transfer of Care (Signed)
 Immediate Anesthesia Transfer of Care Note  Patient: Sabrina Roberson  Procedure(s) Performed: BRONCHOSCOPY, FLEXIBLE (Bilateral)  Patient Location: Endoscopy Unit  Anesthesia Type:General  Level of Consciousness: awake, alert , oriented, and patient cooperative  Airway & Oxygen Therapy: Patient Spontanous Breathing and Patient connected to face mask oxygen  Post-op Assessment: Report given to RN, Post -op Vital signs reviewed and stable, Patient moving all extremities, Patient moving all extremities X 4, and Patient able to stick tongue midline  Post vital signs: Reviewed and stable  Last Vitals:  Vitals Value Taken Time  BP 149/84 06/27/23 0826  Temp    Pulse 63 06/27/23 0827  Resp 23 06/27/23 0827  SpO2 100 % 06/27/23 0827  Vitals shown include unfiled device data.  Last Pain:  Vitals:   06/27/23 0826  TempSrc:   PainSc: Asleep         Complications: No notable events documented.

## 2023-06-27 NOTE — Anesthesia Postprocedure Evaluation (Signed)
 Anesthesia Post Note  Patient: Sabrina Roberson  Procedure(s) Performed: BRONCHOSCOPY, FLEXIBLE (Bilateral)     Patient location during evaluation: PACU Anesthesia Type: General Level of consciousness: awake and alert Pain management: pain level controlled Vital Signs Assessment: post-procedure vital signs reviewed and stable Respiratory status: spontaneous breathing, nonlabored ventilation and respiratory function stable Cardiovascular status: blood pressure returned to baseline and stable Postop Assessment: no apparent nausea or vomiting Anesthetic complications: no   No notable events documented.  Last Vitals:  Vitals:   06/27/23 0840 06/27/23 0850  BP: 128/65 120/81  Pulse: (!) 57 (!) 56  Resp: 20 (!) 23  Temp:    SpO2: 95% 96%    Last Pain:  Vitals:   06/27/23 0850  TempSrc:   PainSc: 0-No pain                 Lavonna Lampron

## 2023-06-27 NOTE — Discharge Instructions (Signed)
 Resume normal diet as tolerated Call office if you develop fever 24 hours post-procedure We will call you with results, cultures can take 1-6 weeks to return

## 2023-06-27 NOTE — Anesthesia Procedure Notes (Signed)
 Procedure Name: Intubation Date/Time: 06/27/2023 8:12 AM  Performed by: Hebert Littler, CRNAPre-anesthesia Checklist: Patient identified, Emergency Drugs available, Suction available, Patient being monitored and Timeout performed Patient Re-evaluated:Patient Re-evaluated prior to induction Oxygen Delivery Method: Circle system utilized Preoxygenation: Pre-oxygenation with 100% oxygen Induction Type: IV induction Ventilation: Mask ventilation without difficulty Laryngoscope Size: Mac and 3 Grade View: Grade I Tube type: Oral Tube size: 8.0 mm Number of attempts: 1 Airway Equipment and Method: Stylet and Lighted stylet Placement Confirmation: ETT inserted through vocal cords under direct vision, positive ETCO2, CO2 detector and breath sounds checked- equal and bilateral Secured at: 22 cm Tube secured with: Tape

## 2023-06-27 NOTE — Anesthesia Preprocedure Evaluation (Addendum)
 Anesthesia Evaluation  Patient identified by MRN, date of birth, ID band Patient awake    Reviewed: Allergy  & Precautions, NPO status , Patient's Chart, lab work & pertinent test results  History of Anesthesia Complications (+) PONV and history of anesthetic complications  Airway Mallampati: II  TM Distance: >3 FB Neck ROM: Full    Dental  (+) Dental Advisory Given   Pulmonary shortness of breath, asthma , former smoker   + rhonchi        Cardiovascular hypertension, + DOE   Rhythm:Regular     Neuro/Psych  Headaches PSYCHIATRIC DISORDERS Anxiety Depression    TIA Neuromuscular disease    GI/Hepatic hiatal hernia,GERD  ,,  Endo/Other  diabetesHypothyroidism    Renal/GU Renal disease     Musculoskeletal  (+) Arthritis ,    Abdominal   Peds  Hematology   Anesthesia Other Findings   Reproductive/Obstetrics                             Anesthesia Physical Anesthesia Plan  ASA: 2  Anesthesia Plan: General   Post-op Pain Management: Minimal or no pain anticipated   Induction: Intravenous  PONV Risk Score and Plan: 4 or greater and Ondansetron , Dexamethasone , TIVA, Midazolam  and Propofol  infusion  Airway Management Planned:   Additional Equipment: None  Intra-op Plan:   Post-operative Plan: Extubation in OR  Informed Consent: I have reviewed the patients History and Physical, chart, labs and discussed the procedure including the risks, benefits and alternatives for the proposed anesthesia with the patient or authorized representative who has indicated his/her understanding and acceptance.     Dental advisory given  Plan Discussed with: CRNA  Anesthesia Plan Comments:        Anesthesia Quick Evaluation

## 2023-06-27 NOTE — Progress Notes (Signed)
 Per Dr. Diania Fortes patient ok for d/c after bronchoscopy. No pneumo.

## 2023-06-27 NOTE — Interval H&P Note (Signed)
 History and Physical Interval Note:  06/27/2023 7:42 AM  Sabrina Roberson  has presented today for surgery, with the diagnosis of pneumonia.  The various methods of treatment have been discussed with the patient and family. After consideration of risks, benefits and other options for treatment, the patient has consented to  Procedure(s): BRONCHOSCOPY, FLEXIBLE (Bilateral) as a surgical intervention.  The patient's history has been reviewed, patient examined, no change in status, stable for surgery.  I have reviewed the patient's chart and labs.  Questions were answered to the patient's satisfaction.     Wilfredo Hanly

## 2023-06-27 NOTE — Op Note (Signed)
 Bronchoscopy Procedure Note  Sabrina Roberson  [id number]  25-Sep-1970  Date:06/27/23  Time:8:11 AM   Provider Performing:Leonardo Plaia B Jackson Fetters   Procedure(s):  Flexible bronchoscopy with bronchial alveolar lavage (40981)  Indication(s) Pneumonia  Consent Risks of the procedure as well as the alternatives and risks of each were explained to the patient and/or caregiver.  Consent for the procedure was obtained and is signed in the bedside chart  Anesthesia General   Time Out Verified patient identification, verified procedure, site/side was marked, verified correct patient position, special equipment/implants available, medications/allergies/relevant history reviewed, required imaging and test results available.   Sterile Technique Usual hand hygiene, masks, gowns, and gloves were used   Procedure Description Bronchoscope advanced through endotracheal tube and into airway.  Airways were examined down to subsegmental level with findings noted below.   Following diagnostic evaluation, right bronchial washing with return of 40mL purulent fluid and BAL(s) performed in RUL with normal saline and return of 35mL fluid  Findings:  - copious purulent secretions noted from the distal trachea into the right and left main bronchi - Normal appearing airways once secretions suctioned to clarity - Slight narrowing of RML entrance - no foreign bodies or endobronchial lesions noted   Complications/Tolerance None; patient tolerated the procedure well. Chest X-ray is needed post procedure.   EBL Minimal   Specimen(s) Right Bronchial Washing sent for Respiratory culture, Fungal and AFB cultures RUL BAL sent for cell count, cytology, and cultures

## 2023-06-28 LAB — ACID FAST SMEAR (AFB, MYCOBACTERIA): Acid Fast Smear: NEGATIVE

## 2023-06-28 LAB — FUNGAL STAIN REFLEX

## 2023-06-28 LAB — FUNGUS STAIN

## 2023-06-29 LAB — ACID FAST SMEAR (AFB, MYCOBACTERIA): Acid Fast Smear: NEGATIVE

## 2023-06-30 LAB — FUNGUS CULTURE RESULT

## 2023-06-30 LAB — CYTOLOGY - NON PAP

## 2023-06-30 LAB — FUNGUS CULTURE WITH STAIN

## 2023-07-01 ENCOUNTER — Telehealth: Payer: Self-pay | Admitting: Pulmonary Disease

## 2023-07-01 ENCOUNTER — Encounter (HOSPITAL_COMMUNITY): Payer: Self-pay | Admitting: Pulmonary Disease

## 2023-07-01 DIAGNOSIS — J471 Bronchiectasis with (acute) exacerbation: Secondary | ICD-10-CM

## 2023-07-01 MED ORDER — AMOXICILLIN-POT CLAVULANATE 875-125 MG PO TABS: 1.0000 | ORAL_TABLET | Freq: Two times a day (BID) | ORAL | 0 refills | Status: DC

## 2023-07-01 NOTE — Telephone Encounter (Signed)
 Please let patient know that she has grown out a bacteria in her cultures from the recent bronchoscopy. I am starting her on augmentin twice daily for 14 days.  Thanks, JD

## 2023-07-02 LAB — AEROBIC/ANAEROBIC CULTURE W GRAM STAIN (SURGICAL/DEEP WOUND): Gram Stain: NONE SEEN

## 2023-07-02 NOTE — Telephone Encounter (Signed)
 Spoke w/ PT verbal understanding,see you at up coming appt.

## 2023-07-03 ENCOUNTER — Telehealth (HOSPITAL_BASED_OUTPATIENT_CLINIC_OR_DEPARTMENT_OTHER): Payer: Self-pay | Admitting: *Deleted

## 2023-07-03 ENCOUNTER — Ambulatory Visit: Payer: Self-pay | Admitting: Family Medicine

## 2023-07-03 NOTE — Telephone Encounter (Signed)
 Called and spoke with pt about the message received. States she has been having problems thinking that her kids have been doing things behind her back, that her grandkids are being taken away from her, that her husband has been having recordings going on in house and also that information has been given out to a third party.  States she feels like something is going to happen and that she will be going to jail. Pt then said that nobody in her family has done any of the above at all that it is just her thinking all of this has been going on.  Patient wonders if she might've had oxygen deprivation when she was in the hospital December 2024 to make her start thinking like this; made a comment that she does not think she is going crazy but wonders if all this could be due to lack of oxygen.  Alexis, please advise on all this.

## 2023-07-03 NOTE — Telephone Encounter (Signed)
 Copied from CRM [id number]. Topic: Clinical - Medical Advice >> Jul 03, 2023  2:16 PM Santiya F wrote: Reason for CRM: Patient is calling in because she says she is concerned with her health and believes there may be something going on in her brain. Patient didn't give many details on what she specifically thinks is happening but she did say she believes she may have lost some oxygen to her brain. Patient is requesting to speak with her provider. Patient wants to see her. Offered her an appointment for 07/04/23 in person and virtually and patient declined. Next appointment is July and patient says that won't work. Please follow up with patient.

## 2023-07-03 NOTE — Telephone Encounter (Signed)
 FYI Dr Diania Fortes, please advise.

## 2023-07-03 NOTE — Telephone Encounter (Signed)
 FYI Only or Action Required?: Action required by provider  Patient is followed in Pulmonology for MRSA pneumonia and bronchiectasis, last seen on 06/10/2023 by Wilfredo Hanly, MD. Called Nurse Triage reporting Cyst. Symptoms began several days ago. Interventions attempted: Nothing. Symptoms are: unchanged.  Triage Disposition: Home Care  Patient/caregiver understands and will follow disposition?: Yes, patient requests call back with Dr. Reine Caraway opinion of these knots. Call back number is [phone].         Copied from CRM [id number]. Topic: Clinical - Red Word Triage >> Jul 03, 2023  3:51 PM Chantha C wrote: Red Word that prompted transfer to Nurse Triage: Patient [phone] states the left armpit has 3-4 knots if that has to do with something the bacteria infection cultures from bronchoscopy? Dr. Diania Fortes wantst to prescribe an antibiotics will this help symptoms? Patient is unsure if she needs contact Dr. Diania Fortes or pcp. Patient states painful, swelling up to the neck, dizziness, headaches, shortness of breath and chills. Patient denies a fever. Please advise. >> Jul 03, 2023  4:05 PM Chantha C wrote: Patient does not want to wait any longer and can have the nurse call patient back.  Patient noticed the knots under the left armpit on Monday and it's getting bigger.  Reason for Disposition  [1] Small swelling or lump AND [2] unexplained AND [3] present < 1 week  Answer Assessment - Initial Assessment Questions Patient states she saw Dr. Diania Fortes on Friday and he did a bronchoscopy. Patient states she now has three little knots underneath the left armpit, red around the knots and hard to touch. Patient was called in an antibiotic. Patient wants to know if the antibiotic will treat the knots. Patient states they are painful at times.  Protocols used: Skin Lump or Localized Swelling-A-AH

## 2023-07-04 NOTE — Telephone Encounter (Signed)
 The patient called back in checking on the status of her message from yesterday. It was routed by [facility] as a high priority but she still has not heard back from anyone. Please assist her as soon as possible as it is almost the weekend.

## 2023-07-04 NOTE — Telephone Encounter (Signed)
 Please let patient know that these knots in her arm pits are not related to the lung infection or bronchoscopy.  If the knots are from infection, the antibiotic may help. If getting worse recommend seeing your primary care doctor or going to the ER for further evaluation.  Dr. Diania Fortes   I called and spoke with the pt and notified of response from Dr. Diania Fortes. She verbalized understanding. Nothing further needed.

## 2023-07-07 NOTE — Telephone Encounter (Signed)
 Nonda Bays, FNP      It is possible that she may have experienced hypoxia or hypercapnia (CO2 increase) due to her respiratory issues going on. Is she having any auditory  or visual hallucinations? Seeing or hearing things that are not present? If so , I would recommend going to [facility] or the ER as they may want to check on certain labs. Additionally, we can make an appt to see her in the office for workup .    Called and spoke with pt about the info per [employer]. She said that she gets to where she is in a deep stare and states that she begins to think that she is going to jail, thinks that her grandkids are being taken away from her, and thinks she is being recorded. Pt also states that she knows that this is not true.  Pt said this originally began when her spouse was first diagnosed with cancer but then she put it behind her and then recently these thoughts began again. Let her know about the recommendations per Trula Gable and pt said she would rather come in for an OV with [employer]. Pt scheduled tomorrow 6/10 at 9:30.

## 2023-07-08 ENCOUNTER — Encounter (HOSPITAL_BASED_OUTPATIENT_CLINIC_OR_DEPARTMENT_OTHER): Payer: Self-pay | Admitting: Family Medicine

## 2023-07-08 ENCOUNTER — Ambulatory Visit (INDEPENDENT_AMBULATORY_CARE_PROVIDER_SITE_OTHER): Admitting: Family Medicine

## 2023-07-08 VITALS — BP 137/85 | HR 57 | Ht 64.0 in | Wt 146.8 lb

## 2023-07-08 DIAGNOSIS — F22 Delusional disorders: Secondary | ICD-10-CM | POA: Insufficient documentation

## 2023-07-08 DIAGNOSIS — R590 Localized enlarged lymph nodes: Secondary | ICD-10-CM | POA: Diagnosis not present

## 2023-07-08 DIAGNOSIS — F333 Major depressive disorder, recurrent, severe with psychotic symptoms: Secondary | ICD-10-CM | POA: Diagnosis not present

## 2023-07-08 HISTORY — DX: Delusional disorders: F22

## 2023-07-08 MED ORDER — ARIPIPRAZOLE 2 MG PO TABS: 2.0000 mg | ORAL_TABLET | Freq: Every day | ORAL | 3 refills | Status: DC

## 2023-07-08 NOTE — Progress Notes (Signed)
 Subjective:   Sabrina Roberson 02-Dec-1970 07/08/2023  Chief Complaint  Patient presents with   Medical Management of Chronic Issues    Patient has been hearing various things that have not been true (refer to phone encounter).    HPI: Sabrina Roberson presents today for re-assessment and management of chronic medical conditions.  Patient states for the past 2 years she has dealt with stressors given her husband's cancer diagnosis and increasing depression. She is concerned she may have PTSD. She states she got into an argument with her husband approx. 2.5 years ago with her husband who at the time desired to leave the marriage. She is concerned that her husband has gone to clerk of court to get "consent over her".   She is concerned that "they can see her in the house with monitors and can hear her conversation." She states "they" are referring to her kids. She states her kids are coming after her money and believes her husband's family is involved. She believes he has become power of attorney for her but has not been notified and she has not given consent for this. She has noticed patterns of behavioral change in her children towards her. She states her family is desiring her to get help.   Example provided by patient of paranoid concern includes "looking at a rabbit cage and feeling like her family would want her to be in there". She states when family says certain words such as "blue" she feels they are implying things about her. Denies thoughts of SI or HI. Denies auditory or visual hallucinations.   MDQ Screening: Answered "Yes" to 5 responses. States problems did cause moderate issues in her day to day.       07/08/2023    9:29 AM 05/29/2023   10:53 AM 04/03/2023    1:35 PM 03/04/2023    1:45 PM 02/17/2023   10:41 AM  Depression screen PHQ 2/9  Decreased Interest 0 3 1 1 1   Down, Depressed, Hopeless 3 3 3  0 0  PHQ - 2 Score 3 6 4 1 1   Altered sleeping 0 3 3 0 3  Tired, decreased  energy 3 2 3 2 3   Change in appetite 0 2 2 0 2  Feeling bad or failure about yourself  3 3 1 1  0  Trouble concentrating 1 3 1  0 0  Moving slowly or fidgety/restless 1 1 3 3 2   Suicidal thoughts 1 0 0 0 0  PHQ-9 Score 12 20 17 7 11   Difficult doing work/chores Extremely dIfficult Very difficult Very difficult Not difficult at all Not difficult at all   Lymphadenopathy:  Patient reports ongoing left axillary lymphadenopathy for several months.  She is currently being treated for MRSA pneumonia by Dr. Diania Fortes with pulmonology.  She recently underwent a bronchoscopy on May 30 and was placed on Augmentin .  She has a follow-up with Dr. Diania Fortes in the next week.  She states she was recommended to follow-up with her PCP regarding ongoing lymphadenopathy.  She reports mild tenderness with palpation.  Denies rash, warmth, skin or breast changes.   The following portions of the patient's history were reviewed and updated as appropriate: past medical history, past surgical history, family history, social history, allergies, medications, and problem list.   Patient Active Problem List   Diagnosis Date Noted   Severe episode of recurrent major depressive disorder, with psychotic features (HCC) 07/08/2023   Paranoid behavior (HCC) 07/08/2023   Lymphadenopathy,  axillary 07/08/2023   Recurrent pneumonia 06/27/2023   Bronchiectasis without complication (HCC) 06/10/2023   Localized swelling of both lower legs 05/29/2023   Pneumonia due to methicillin resistant Staphylococcus aureus (MRSA) (HCC) 04/03/2023   Physical deconditioning 02/17/2023   Bilateral lower extremity edema 12/03/2022   Bilateral carotid bruits 11/04/2022   CKD (chronic kidney disease) stage 2, GFR 60-89 ml/min 11/04/2022   Mixed hyperlipidemia 10/10/2022   GAD (generalized anxiety disorder) 10/10/2022   Acquired hypothyroidism 10/10/2022   Hypertension    Type 2 diabetes mellitus with diabetic chronic kidney disease (HCC)    Primary  insomnia 09/18/2022   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   DOE (dyspnea on exertion) 12/21/2013   Benzodiazepine dependence, episodic (HCC) 12/23/2011   DDD (degenerative disc disease), cervical 12/23/2011   DDD (degenerative disc disease), lumbosacral 12/23/2011   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   Fibromyalgia 02/11/2011   Past Medical History:  Diagnosis Date   Abdominal pain 02/11/2011   Abnormal CT of the chest 02/04/2014   CT chest 08/2013:  atx in RML and lingula  CT angio 12/2013:  No PE, RML scarring, abnormal density in lingula  (done at [facility])     Allergy     Anxiety    Arthritis    Asthma    Bruises easily    Chronic chest wall pain    Chronic pain    Cough    Depression    Diabetes mellitus without complication (HCC)    Dyspnea    chronic   Dysuria 09/18/2022   Encounter for therapeutic drug monitoring 02/11/2011   Fibromyalgia    Functional movement disorder    GERD (gastroesophageal reflux disease)    Headache    Hearing loss    High cholesterol    History of hiatal hernia    Hypertension    Hypothyroidism    Kidney disease    stage 3   Leg swelling    both   Nausea 02/11/2011   Nausea and vomiting 04/01/2013   Pain management    PONV (postoperative nausea and vomiting)    Sebaceous cyst of breast, right subaereolar 10/30/2010   Septic shock (HCC) 01/19/2023   SMAS (superior mesenteric artery syndrome) (HCC)    Thyroid  disease    TIA (transient ischemic attack)    Tic disorder    TMJ (dislocation of temporomandibular joint)    Transient cerebral ischemia    Type 2 diabetes mellitus with complications (HCC)    Past Surgical History:  Procedure Laterality Date   AORTA - SUPERIOR MESENTERIC AND AORTA - RENAL ARTERY BYPASS GRAFT     APPENDECTOMY     BREAST CYST EXCISION Right    BREAST EXCISIONAL BIOPSY     CHOLECYSTECTOMY     cyst removed     FLEXIBLE BRONCHOSCOPY Bilateral 06/27/2023   Procedure: BRONCHOSCOPY,  FLEXIBLE;  Surgeon: Wilfredo Hanly, MD;  Location: [facility] ENDOSCOPY;  Service: Pulmonary;  Laterality: Bilateral;   HERNIA REPAIR     OOPHORECTOMY     bilateral   TEE WITHOUT CARDIOVERSION N/A 06/18/2012   Procedure: TRANSESOPHAGEAL ECHOCARDIOGRAM (TEE);  Surgeon: Luana Rumple, MD;  Location: [facility] ENDOSCOPY;  Service: Cardiovascular;  Laterality: N/A;   TOTAL ABDOMINAL HYSTERECTOMY     Family History  Problem Relation Age of Onset   Asthma Father    Breast cancer Sister    Hypertension Sister    Parkinson's disease Maternal Aunt    Pulmonary fibrosis Maternal Grandmother    Emphysema  Maternal Grandmother    Heart disease Maternal Grandmother    Breast cancer Maternal Grandmother    Asthma Maternal Grandfather    Heart disease Maternal Grandfather    Cancer Paternal Grandmother        breast   Heart disease Paternal Grandmother    Breast cancer Paternal Grandmother    Asthma Paternal Grandmother    Leukemia Paternal Grandfather    Heart disease Paternal Grandfather    Clotting disorder Paternal Grandfather    Outpatient Medications Prior to Visit  Medication Sig Dispense Refill   albuterol  (VENTOLIN  HFA) 108 (90 Base) MCG/ACT inhaler Inhale 2 puffs into the lungs every 6 (six) hours as needed for wheezing or shortness of breath. 8 g 2   amoxicillin -clavulanate (AUGMENTIN ) 875-125 MG tablet Take 1 tablet by mouth 2 (two) times daily. 28 tablet 0   atorvastatin  (LIPITOR) 20 MG tablet Take 1 tablet (20 mg total) by mouth at bedtime. 90 tablet 3   diazepam  (VALIUM ) 5 MG tablet TAKE 1 TABLET (5 MG TOTAL) BY MOUTH TWICE A DAY AS NEEDED FOR ANXIETY 30 tablet 3   dicyclomine  (BENTYL ) 10 MG capsule Take 1 capsule (10 mg total) by mouth 4 (four) times daily -  before meals and at bedtime. (Patient taking differently: Take 10 mg by mouth 3 (three) times daily as needed for spasms.) 60 capsule 1   gabapentin  (NEURONTIN ) 600 MG tablet TAKE 1 TABLET BY MOUTH AT BEDTIME. 60 tablet 3    ipratropium-albuterol  (DUONEB) 0.5-2.5 (3) MG/3ML SOLN Take 3 mLs by nebulization every 4 (four) hours as needed (shortness of breath, wheezing, or cough). 360 mL 2   levothyroxine  (SYNTHROID ) 88 MCG tablet Take 1 tablet (88 mcg total) by mouth daily before breakfast. 30 tablet 3   metFORMIN  (GLUCOPHAGE -XR) 500 MG 24 hr tablet Take 1 tablet (500 mg total) by mouth daily with breakfast. 90 tablet 3   promethazine  (PHENERGAN ) 25 MG tablet Take 1 tablet (25 mg total) by mouth every 8 (eight) hours as needed for nausea or vomiting. 30 tablet 3   propranolol  (INDERAL ) 80 MG tablet Take 1 tablet (80 mg total) by mouth daily. 90 tablet 3   sodium chloride  HYPERTONIC 3 % nebulizer solution Take by nebulization as needed for cough. Use 3 mL Twice daily until symptoms improve 75 mL 5   buPROPion  ER (WELLBUTRIN  SR) 100 MG 12 hr tablet Take 1 tablet (100 mg total) by mouth daily. 90 tablet 3   furosemide  (LASIX ) 20 MG tablet Take 1 tablet (20 mg total) by mouth daily for 5 days. 30 tablet 3   mupirocin  cream (BACTROBAN ) 2 % APPLY TO AFFECTED AREA TWICE A DAY (Patient not taking: Reported on 07/08/2023) 15 g 1   nystatin cream (MYCOSTATIN) Apply 1 Application topically 2 (two) times daily. (Patient not taking: Reported on 07/08/2023)     pantoprazole  (PROTONIX ) 40 MG tablet Take 1 tablet (40 mg total) by mouth daily. 30 tablet 0   umeclidinium-vilanterol (ANORO ELLIPTA ) 62.5-25 MCG/ACT AEPB Inhale 1 puff into the lungs daily. (Patient not taking: Reported on 07/08/2023) 60 each 3   mirtazapine  (REMERON ) 15 MG tablet TAKE 1 TABLET BY MOUTH EVERYDAY AT BEDTIME 30 tablet 5   No facility-administered medications prior to visit.   Allergies  Allergen Reactions   Latex Anaphylaxis and Swelling   Codeine Nausea And Vomiting    Other reaction(s): GI Upset (intolerance), Vomiting (intolerance)   Ibuprofen Other (See Comments)    GI upset, burning sensation  Oxycodone  Nausea And Vomiting    Pre-medication with  phenergan  needed.   Tape Itching and Rash    Please use "paper" tape only.     ROS: A complete ROS was performed with pertinent positives/negatives noted in the HPI. The remainder of the ROS are negative.    Objective:   Today's Vitals   07/08/23 0924  BP: 137/85  Pulse: (!) 57  SpO2: 99%  Weight: 146 lb 12.8 oz (66.6 kg)  Height: 5\' 4"  (1.626 m)    Physical Exam          GENERAL: Well-appearing, in NAD. Well nourished.  SKIN: Pink, warm and dry.  Head: Normocephalic. NECK: Trachea midline. Full ROM w/o pain or tenderness.  EARS: Tympanic membranes are intact, translucent without bulging and without drainage. Appropriate landmarks visualized.  EYES: Conjunctiva clear without exudates. EOMI, PERRL, no drainage present.  NOSE: Septum midline w/o deformity. Nares patent, mucosa pink and non-inflamed w/o drainage. No sinus tenderness.  THROAT: Uvula midline. Oropharynx clear. Tonsils non-inflamed without exudate. Mucous membranes pink and moist.  RESPIRATORY: Chest wall symmetrical. Respirations even and non-labored. Breath sounds clear to auscultation bilaterally.  Cough is congested, Moderate left-sided axillary lymphadenopathy, mild tenderness with palpation CARDIAC: S1, S2 present, regular rate and rhythm without murmur or gallops. Peripheral pulses 2+ bilaterally.  MSK: Muscle tone and strength appropriate for age. NEUROLOGIC: No motor or sensory deficits. Steady, even gait. C2-C12 intact.  PSYCH/MENTAL STATUS: Alert, oriented x 3.  Patient has rapid pressured speech and mood is anxious.  Patient is tearful.  Mildly paranoid content regarding family stressors.  Attention and concentration are normal.  Hygiene is unremarkable.  Health Maintenance Due  Topic Date Due   OPHTHALMOLOGY EXAM  Never done   DEXA SCAN  02/08/2017   Zoster Vaccines- Shingrix (1 of 2) Never done   COVID-19 Vaccine (1 - 2024-25 season) Never done      Assessment & Plan:  1. Severe episode of  recurrent major depressive disorder, with psychotic features (HCC) (Primary) Discussed possibility of psychotic features due to recurrent major depressive disorder due to ongoing stressors with medical conditions and family.  Recommend patient will wean from Wellbutrin  as this may be contributing to psychoses and start Abilify 2 mg daily and also refer to psychiatry for further evaluation and medication management.  Weaning schedule for Wellbutrin  provided to patient and reviewed.  We discussed possibility of bipolar depression and patient verbalized understanding.  Safety plan was reviewed with patient and local resources also reviewed.  Rule out possible contributing factors to psychosis and paranoia.  Recommend she follow-up in 2 to 3 weeks or sooner if needed. - ARIPiprazole (ABILIFY) 2 MG tablet; Take 1 tablet (2 mg total) by mouth daily.  Dispense: 30 tablet; Refill: 3 - Ambulatory referral to Psychiatry  2. Paranoid behavior (HCC) No signs or symptoms of acute infection, hypoxia, hypercapnia or UTI contributing, but will rule out with lab work obtained today.   - CBC with Differential/Platelet - Comprehensive metabolic panel with GFR - Urinalysis, Routine w reflex microscopic  3. Lymphadenopathy, axillary Will obtain ultrasound of axillary lymph nodes and notify Dr. Diania Fortes of ongoing evaluation. - US  SOFT TISSUE UPPER EXTREMITY LIMITED LEFT (NON-VASCULAR); Future   Meds ordered this encounter  Medications   ARIPiprazole (ABILIFY) 2 MG tablet    Sig: Take 1 tablet (2 mg total) by mouth daily.    Dispense:  30 tablet    Refill:  3    Supervising Provider:  DE Peru, RAYMOND J [[id number]]   Lab Orders         CBC with Differential/Platelet         Comprehensive metabolic panel with GFR         Urinalysis, Routine w reflex microscopic     Return in about 3 weeks (around 07/29/2023) for Follow up Depressive Symptoms .    Patient to reach out to office if new, worrisome, or unresolved  symptoms arise or if no improvement in patient's condition. Patient verbalized understanding and is agreeable to treatment plan. All questions answered to patient's satisfaction.    Nonda Bays, [place]

## 2023-07-08 NOTE — Patient Instructions (Addendum)
 Wean from Wellbutrin :  Take 1/2 tablet daily for 14 days. Then take 1/2 tablet every other day for 7 days. Then take 1/2 tablet every 2 days for 7 days and STOP.    Psychiatric Services:  [facility]) [phone] [facility] [place]) - [phone] Dr. Daph[facility]) [phone] [facility]) - [phone] [facility], [address] [place], [place] [phone]   [facility]- open 24/7

## 2023-07-09 ENCOUNTER — Ambulatory Visit (HOSPITAL_BASED_OUTPATIENT_CLINIC_OR_DEPARTMENT_OTHER): Payer: Self-pay | Admitting: Family Medicine

## 2023-07-09 DIAGNOSIS — E870 Hyperosmolality and hypernatremia: Secondary | ICD-10-CM

## 2023-07-09 LAB — URINALYSIS, ROUTINE W REFLEX MICROSCOPIC
Bilirubin, UA: NEGATIVE
Glucose, UA: NEGATIVE
Ketones, UA: NEGATIVE
Nitrite, UA: NEGATIVE
Protein,UA: NEGATIVE
RBC, UA: NEGATIVE
Specific Gravity, UA: 1.009 (ref 1.005–1.030)
Urobilinogen, Ur: 0.2 mg/dL (ref 0.2–1.0)
pH, UA: 6 (ref 5.0–7.5)

## 2023-07-09 LAB — COMPREHENSIVE METABOLIC PANEL WITH GFR
ALT: 12 IU/L (ref 0–32)
AST: 25 IU/L (ref 0–40)
Albumin: 4.3 g/dL (ref 3.9–4.9)
Alkaline Phosphatase: 98 IU/L (ref 44–121)
BUN/Creatinine Ratio: 15 (ref 9–23)
BUN: 15 mg/dL (ref 6–24)
Bilirubin Total: <0.2 mg/dL (ref 0.0–1.2)
CO2: 23 mmol/L (ref 20–29)
Calcium: 9.4 mg/dL (ref 8.7–10.2)
Chloride: 105 mmol/L (ref 96–106)
Creatinine, Ser: 1.03 mg/dL — ABNORMAL HIGH (ref 0.57–1.00)
Globulin, Total: 2.4 g/dL (ref 1.5–4.5)
Glucose: 71 mg/dL (ref 70–99)
Potassium: 5.2 mmol/L (ref 3.5–5.2)
Sodium: 146 mmol/L — ABNORMAL HIGH (ref 134–144)
Total Protein: 6.7 g/dL (ref 6.0–8.5)
eGFR: 66 mL/min/{1.73_m2} (ref >59)

## 2023-07-09 LAB — CBC WITH DIFFERENTIAL/PLATELET
Basophils Absolute: 0 10*3/uL (ref 0.0–0.2)
Basos: 1 %
EOS (ABSOLUTE): 0.4 10*3/uL (ref 0.0–0.4)
Eos: 7 %
Hematocrit: 38 % (ref 34.0–46.6)
Hemoglobin: 11.7 g/dL (ref 11.1–15.9)
Immature Grans (Abs): 0.1 10*3/uL (ref 0.0–0.1)
Immature Granulocytes: 1 %
Lymphocytes Absolute: 2.3 10*3/uL (ref 0.7–3.1)
Lymphs: 36 %
MCH: 28.3 pg (ref 26.6–33.0)
MCHC: 30.8 g/dL — ABNORMAL LOW (ref 31.5–35.7)
MCV: 92 fL (ref 79–97)
Monocytes Absolute: 0.4 10*3/uL (ref 0.1–0.9)
Monocytes: 7 %
Neutrophils Absolute: 3.1 10*3/uL (ref 1.4–7.0)
Neutrophils: 48 %
Platelets: 401 10*3/uL (ref 150–450)
RBC: 4.13 x10E6/uL (ref 3.77–5.28)
RDW: 14.1 % (ref 11.7–15.4)
WBC: 6.4 10*3/uL (ref 3.4–10.8)

## 2023-07-09 LAB — MICROSCOPIC EXAMINATION
Bacteria, UA: NONE SEEN
Casts: NONE SEEN /LPF
RBC, Urine: NONE SEEN /HPF (ref 0–2)

## 2023-07-09 NOTE — Progress Notes (Signed)
 Please schedule for lab visit in 2-3 weeks. BMP ordered.  Hi Sabrina Roberson,  Your blood counts are unremarkable and your urine is negative for acute infection. Your sodium (salt) level is slightly elevated. Please increase your clear water intake and I recommend we repeat this in 2-3 weeks with a lab visit only. There were no other signs of acute metabolic disease that would be contributing to your symptoms at this time. Please stay well hydrated and increase your water intake. Sabrina Roberson will schedule you for a lab visit.

## 2023-07-12 ENCOUNTER — Other Ambulatory Visit (HOSPITAL_BASED_OUTPATIENT_CLINIC_OR_DEPARTMENT_OTHER): Payer: Self-pay | Admitting: Family Medicine

## 2023-07-12 DIAGNOSIS — E039 Hypothyroidism, unspecified: Secondary | ICD-10-CM

## 2023-07-14 ENCOUNTER — Other Ambulatory Visit (INDEPENDENT_AMBULATORY_CARE_PROVIDER_SITE_OTHER)

## 2023-07-14 ENCOUNTER — Ambulatory Visit: Admitting: Pulmonary Disease

## 2023-07-14 ENCOUNTER — Encounter: Payer: Self-pay | Admitting: Pulmonary Disease

## 2023-07-14 ENCOUNTER — Telehealth (HOSPITAL_COMMUNITY): Payer: Self-pay | Admitting: *Deleted

## 2023-07-14 VITALS — BP 137/91 | HR 58 | Ht 64.0 in | Wt 147.0 lb

## 2023-07-14 DIAGNOSIS — J471 Bronchiectasis with (acute) exacerbation: Secondary | ICD-10-CM

## 2023-07-14 DIAGNOSIS — R1310 Dysphagia, unspecified: Secondary | ICD-10-CM | POA: Diagnosis not present

## 2023-07-14 DIAGNOSIS — Z87891 Personal history of nicotine dependence: Secondary | ICD-10-CM | POA: Diagnosis not present

## 2023-07-14 LAB — SEDIMENTATION RATE: Sed Rate: 11 mm/h (ref 0–30)

## 2023-07-14 LAB — C-REACTIVE PROTEIN: CRP: <1 mg/dL (ref 0.5–20.0)

## 2023-07-14 MED ORDER — BEVESPI AEROSPHERE 9-4.8 MCG/ACT IN AERO: 2.0000 | INHALATION_SPRAY | Freq: Two times a day (BID) | RESPIRATORY_TRACT | 5 refills | Status: AC

## 2023-07-14 MED ORDER — AMOXICILLIN-POT CLAVULANATE 875-125 MG PO TABS: 1.0000 | ORAL_TABLET | Freq: Two times a day (BID) | ORAL | 0 refills | Status: DC

## 2023-07-14 NOTE — Patient Instructions (Addendum)
 Start bevespi inhaler 2 puffs twice daily (Call us  if the inhaler is still too expensive)   Airway Clearance: - Use hypertonic saline nebulizer treatments twice daily followed by flutter valve to help clear out the mucous in your lungs  Use albuterol  inhaler 1-2 puffs every 4-6 hours as needed  Continue augmentin  twice daily for 2 weeks  We will check labs today at the [facility]  Follow up in 3 months

## 2023-07-14 NOTE — Telephone Encounter (Signed)
 Left VM at [phone] with option for call back to help schedule swallow study ordered by Dr. Diania Fortes. (AHARRIS)

## 2023-07-14 NOTE — Progress Notes (Signed)
 Synopsis: hx or MRSA pneumonia  Subjective:   PATIENT ID: Sabrina Roberson GENDER: female DOB: 1970/12/21, MRN: [id number]  HPI  Chief Complaint  Patient presents with   Follow-up    Pt states still having issues - sputum brow / greenish    Sabrina Roberson is a 53 year old woman, former smoker with DMII, GERD, depression/anxiety, hypertension, CKD and asthma who returns to pulmonary clinic for MRSA pneumonia and bronchiectasis.  OV 06/10/23 She was last seen by Izetta Rouleau, NP 03/17/23. Sputum cultures have been grown MRSA since 01/20/23. Most recent sputum sample collected 03/17/23. She was placed on a 14 day course of linezolid  starting on 03/20/23.   She continues to have productive cough with green sputum. She experiences cold chills but no fever. Recent dental work has led to difficulty swallowing, complicating chewing and swallowing.  She quit smoking 12/2022. She has been on disability.  OV 07/14/23 She has a persistent cough with thick, brown, greenish-yellow sputum, worsened by heat and humidity, and a sensation of airway closure during coughing. A crackling sensation in her chest is present. She experiences chills but is unsure about fever. Her voice becomes raspy and stops during speech, particularly when ordering. She has not smoked since December after a hospitalization. Her family history includes emphysema on her mother's side.  She uses an albuterol  inhaler as needed but has not used her hypertonic saline treatment since starting antibiotics. She has not used the Anoro inhaler due to insurance issues. She previously used prednisone  but experienced significant jitteriness. Swelling in her arm has improved with antibiotics, though some soreness persists.  Past Medical History:  Diagnosis Date   Abdominal pain 02/11/2011   Abnormal CT of the chest 02/04/2014   CT chest 08/2013:  atx in RML and lingula  CT angio 12/2013:  No PE, RML scarring, abnormal density in lingula  (done at  [facility])     Allergy     Anxiety    Arthritis    Asthma    Bruises easily    Chronic chest wall pain    Chronic pain    Cough    Depression    Diabetes mellitus without complication (HCC)    Dyspnea    chronic   Dysuria 09/18/2022   Encounter for therapeutic drug monitoring 02/11/2011   Fibromyalgia    Functional movement disorder    GERD (gastroesophageal reflux disease)    Headache    Hearing loss    High cholesterol    History of hiatal hernia    Hypertension    Hypothyroidism    Kidney disease    stage 3   Leg swelling    both   Nausea 02/11/2011   Nausea and vomiting 04/01/2013   Pain management    PONV (postoperative nausea and vomiting)    Sebaceous cyst of breast, right subaereolar 10/30/2010   Septic shock (HCC) 01/19/2023   SMAS (superior mesenteric artery syndrome) (HCC)    Thyroid  disease    TIA (transient ischemic attack)    Tic disorder    TMJ (dislocation of temporomandibular joint)    Transient cerebral ischemia    Type 2 diabetes mellitus with complications (HCC)      Family History  Problem Relation Age of Onset   Asthma Father    Breast cancer Sister    Hypertension Sister    Parkinson's disease Maternal Aunt    Pulmonary fibrosis Maternal Grandmother    Emphysema Maternal Grandmother    Heart disease  Maternal Grandmother    Breast cancer Maternal Grandmother    Asthma Maternal Grandfather    Heart disease Maternal Grandfather    Cancer Paternal Grandmother        breast   Heart disease Paternal Grandmother    Breast cancer Paternal Grandmother    Asthma Paternal Grandmother    Leukemia Paternal Grandfather    Heart disease Paternal Grandfather    Clotting disorder Paternal Grandfather      Social History   Socioeconomic History   Marital status: Married    Spouse name: Not on file   Number of children: 2   Years of education: Not on file   Highest education level: 12th grade  Occupational History   Occupation:  disabled  Tobacco Use   Smoking status: Former    Current packs/day: 0.00    Average packs/day: 0.5 packs/day for 15.0 years (7.5 ttl pk-yrs)    Types: Cigarettes    Quit date: 01/19/2023    Years since quitting: 0.4   Smokeless tobacco: Never  Vaping Use   Vaping status: Former  Substance and Sexual Activity   Alcohol use: No   Drug use: No   Sexual activity: Not Currently    Partners: Male    Birth control/protection: None  Other Topics Concern   Not on file  Social History Narrative   Not on file   Social Drivers of Health   Financial Resource Strain: Low Risk  (03/02/2023)   Overall Financial Resource Strain (CARDIA)    Difficulty of Paying Living Expenses: Not hard at all  Food Insecurity: No Food Insecurity (03/02/2023)   Hunger Vital Sign    Worried About Running Out of Food in the Last Year: Never true    Ran Out of Food in the Last Year: Never true  Transportation Needs: No Transportation Needs (03/02/2023)   PRAPARE - Administrator, Civil Service (Medical): No    Lack of Transportation (Non-Medical): No  Physical Activity: Inactive (03/02/2023)   Exercise Vital Sign    Days of Exercise per Week: 0 days    Minutes of Exercise per Session: 20 min  Stress: Stress Concern Present (03/02/2023)   [employer] of Occupational Health - Occupational Stress Questionnaire    Feeling of Stress : Very much  Social Connections: Moderately Isolated (03/04/2023)   Social Connection and Isolation Panel    Frequency of Communication with Friends and Family: More than three times a week    Frequency of Social Gatherings with Friends and Family: More than three times a week    Attends Religious Services: Never    Database administrator or Organizations: No    Attends Banker Meetings: Never    Marital Status: Married  Catering manager Violence: Not At Risk (02/17/2023)   Humiliation, Afraid, Rape, and Kick questionnaire    Fear of Current or Ex-Partner: No     Emotionally Abused: No    Physically Abused: No    Sexually Abused: No     Allergies  Allergen Reactions   Latex Anaphylaxis and Swelling   Codeine Nausea And Vomiting    Other reaction(s): GI Upset (intolerance), Vomiting (intolerance)   Ibuprofen Other (See Comments)    GI upset, burning sensation   Oxycodone  Nausea And Vomiting    Pre-medication with phenergan  needed.   Tape Itching and Rash    Please use paper tape only.     Outpatient Medications Prior to Visit  Medication Sig Dispense Refill  albuterol  (VENTOLIN  HFA) 108 (90 Base) MCG/ACT inhaler Inhale 2 puffs into the lungs every 6 (six) hours as needed for wheezing or shortness of breath. 8 g 2   amoxicillin -clavulanate (AUGMENTIN ) 875-125 MG tablet Take 1 tablet by mouth 2 (two) times daily. 28 tablet 0   ARIPiprazole  (ABILIFY ) 2 MG tablet Take 1 tablet (2 mg total) by mouth daily. 30 tablet 3   atorvastatin  (LIPITOR) 20 MG tablet Take 1 tablet (20 mg total) by mouth at bedtime. 90 tablet 3   diazepam  (VALIUM ) 5 MG tablet TAKE 1 TABLET (5 MG TOTAL) BY MOUTH TWICE A DAY AS NEEDED FOR ANXIETY 30 tablet 3   dicyclomine  (BENTYL ) 10 MG capsule Take 1 capsule (10 mg total) by mouth 4 (four) times daily -  before meals and at bedtime. (Patient taking differently: Take 10 mg by mouth 3 (three) times daily as needed for spasms.) 60 capsule 1   furosemide  (LASIX ) 20 MG tablet Take 1 tablet (20 mg total) by mouth daily for 5 days. 30 tablet 3   gabapentin  (NEURONTIN ) 600 MG tablet TAKE 1 TABLET BY MOUTH AT BEDTIME. 60 tablet 3   ipratropium-albuterol  (DUONEB) 0.5-2.5 (3) MG/3ML SOLN Take 3 mLs by nebulization every 4 (four) hours as needed (shortness of breath, wheezing, or cough). 360 mL 2   levothyroxine  (SYNTHROID ) 88 MCG tablet TAKE 1 TABLET BY MOUTH DAILY BEFORE BREAKFAST. 90 tablet 2   metFORMIN  (GLUCOPHAGE -XR) 500 MG 24 hr tablet Take 1 tablet (500 mg total) by mouth daily with breakfast. 90 tablet 3   mupirocin  cream  (BACTROBAN ) 2 % APPLY TO AFFECTED AREA TWICE A DAY (Patient not taking: Reported on 07/08/2023) 15 g 1   nystatin cream (MYCOSTATIN) Apply 1 Application topically 2 (two) times daily. (Patient not taking: Reported on 07/08/2023)     pantoprazole  (PROTONIX ) 40 MG tablet Take 1 tablet (40 mg total) by mouth daily. 30 tablet 0   promethazine  (PHENERGAN ) 25 MG tablet Take 1 tablet (25 mg total) by mouth every 8 (eight) hours as needed for nausea or vomiting. 30 tablet 3   propranolol  (INDERAL ) 80 MG tablet Take 1 tablet (80 mg total) by mouth daily. 90 tablet 3   sodium chloride  HYPERTONIC 3 % nebulizer solution Take by nebulization as needed for cough. Use 3 mL Twice daily until symptoms improve 75 mL 5   umeclidinium-vilanterol (ANORO ELLIPTA ) 62.5-25 MCG/ACT AEPB Inhale 1 puff into the lungs daily. (Patient not taking: Reported on 07/08/2023) 60 each 3   No facility-administered medications prior to visit.    Review of Systems  Constitutional:  Positive for chills. Negative for fever, malaise/fatigue and weight loss.  HENT:  Negative for congestion, sinus pain and sore throat.   Eyes: Negative.   Respiratory:  Positive for cough, sputum production and shortness of breath. Negative for hemoptysis and wheezing.   Cardiovascular:  Negative for chest pain, palpitations, orthopnea, claudication and leg swelling.  Gastrointestinal:  Negative for abdominal pain, heartburn, nausea and vomiting.  Genitourinary: Negative.   Musculoskeletal:  Negative for joint pain and myalgias.  Skin:  Negative for rash.  Neurological:  Negative for weakness.  Endo/Heme/Allergies: Negative.   Psychiatric/Behavioral: Negative.        Objective:   Vitals:   07/14/23 1041  BP: (!) 137/91  Pulse: (!) 58  SpO2: 97%  Weight: 147 lb (66.7 kg)  Height: 5' 4 (1.626 m)     Physical Exam Constitutional:      General: She is not in acute  distress.    Appearance: Normal appearance.   Eyes:     General: No  scleral icterus.    Conjunctiva/sclera: Conjunctivae normal.    Cardiovascular:     Rate and Rhythm: Normal rate and regular rhythm.  Pulmonary:     Breath sounds: No wheezing, rhonchi or rales.   Musculoskeletal:     Right lower leg: No edema.     Left lower leg: No edema.   Skin:    General: Skin is warm and dry.   Neurological:     General: No focal deficit present.       CBC    Component Value Date/Time   WBC 6.4 07/08/2023 1052   WBC 9.3 02/05/2023 0323   RBC 4.13 07/08/2023 1052   RBC 2.74 (L) 02/05/2023 0323   HGB 11.7 07/08/2023 1052   HCT 38.0 07/08/2023 1052   PLT 401 07/08/2023 1052   MCV 92 07/08/2023 1052   MCH 28.3 07/08/2023 1052   MCH 29.2 02/05/2023 0323   MCHC 30.8 (L) 07/08/2023 1052   MCHC 30.2 02/05/2023 0323   RDW 14.1 07/08/2023 1052   LYMPHSABS 2.3 07/08/2023 1052   MONOABS 0.4 01/24/2023 0550   EOSABS 0.4 07/08/2023 1052   BASOSABS 0.0 07/08/2023 1052     Chest imaging: CT Chest 05/16/23 1. New 2.3 x 1.6 cm masslike abnormality in the azygoesophageal recess area, could indicate an area of new right lower lobe pulmonary consolidation, mass or an abnormal lymph node. Per Fleischner Society guidelines, recommend tissue sampling or PET-CT. This the recommendation follows the consensus statement: Guidelines for Management of Incidental Pulmonary Nodules Detected on CT Images: From the Fleischner Society 2017; Radiology 2017; 284:228-243. 2. New patchy ground-glass disease in the medial basal right lower lobe, probably due to pneumonitis. 3. New loosely clustered nodules in the posterior basal left lower lobe ranging from 3 mm up to 8 mm in size. Given the short interval appearance, these are probably inflammatory nodules but will require follow-up. Non-contrast chest CT at 3-6 months is recommended. If the nodules are stable at time of repeat CT, then future CT at 18-24 months (from today's scan) is considered optional for  low-risk patients, but is recommended for high-risk patients. This recommendation follows the consensus statement: Guidelines for Management of Incidental Pulmonary Nodules Detected on CT Images: From the Fleischner Society 2017; Radiology 2017; 284:228-243. 4. Diffuse bronchial thickening, mild bronchiectasis in the lower lobes and in the perihilar areas in the upper lobes. Bilateral bandlike opacities most likely scarring change. 5. Faint scattered ground-glass opacities in areas of previous dense consolidation, most likely representing ground-glass postpneumonic fibrosis. 6. Prominent pulmonary trunk indicating arterial hypertension. 7. Aortic atherosclerosis. 8. Osteopenia.  PFT:    Latest Ref Rng & Units 12/28/2013   12:53 PM 12/01/2013   12:06 PM  PFT Results  FVC-Pre L 3.44  2.60   FVC-Predicted Pre % 93  70   FVC-Post L 3.21    FVC-Predicted Post % 87    Pre FEV1/FVC % % 81  87   Post FEV1/FCV % % 81    FEV1-Pre L 2.79  2.26   FEV1-Predicted Pre % 93  75   FEV1-Post L 2.61      Labs:  Path:  Echo:  Heart Catheterization:       Assessment & Plan:   No diagnosis found.  Discussion: Marinda Ivanoff is a 53 year old woman, former smoker with DMII, GERD, depression/anxiety, hypertension, CKD and asthma who returns to pulmonary  clinic for MRSA pneumonia and bronchiectasis.  Pneumonia with MRSA/Haemophilus Persistent MRSA pneumonia with right lung scarring and new right middle lobe consolidation. Recent cultures have showed Clearance of MRSA but grew haemophilus hemolyticus. Recurrent infections in setting or poor dentition. - Complete course of augmentin  - Discussed need for completing dental work sooner than later  Bronchiectasis - Prescribe bevespi inhaler - Continue hypertonic saline nebulizer and flutter valve therapy to aid mucus clearance. - check inflammatory work up - consider immunology work up in future  Swallowing difficulties Intermittent  swallowing difficulties possibly related to dental issues, with potential aspiration risk. - Order modified barium swallow study to assess swallowing function. - Monitor for signs of aspiration and adjust treatment as necessary.  Follow up in 3 month  Dorn Chill, MD [facility] Office: [phone]   Current Outpatient Medications:    albuterol  (VENTOLIN  HFA) 108 (90 Base) MCG/ACT inhaler, Inhale 2 puffs into the lungs every 6 (six) hours as needed for wheezing or shortness of breath., Disp: 8 g, Rfl: 2   amoxicillin -clavulanate (AUGMENTIN ) 875-125 MG tablet, Take 1 tablet by mouth 2 (two) times daily., Disp: 28 tablet, Rfl: 0   ARIPiprazole  (ABILIFY ) 2 MG tablet, Take 1 tablet (2 mg total) by mouth daily., Disp: 30 tablet, Rfl: 3   atorvastatin  (LIPITOR) 20 MG tablet, Take 1 tablet (20 mg total) by mouth at bedtime., Disp: 90 tablet, Rfl: 3   diazepam  (VALIUM ) 5 MG tablet, TAKE 1 TABLET (5 MG TOTAL) BY MOUTH TWICE A DAY AS NEEDED FOR ANXIETY, Disp: 30 tablet, Rfl: 3   dicyclomine  (BENTYL ) 10 MG capsule, Take 1 capsule (10 mg total) by mouth 4 (four) times daily -  before meals and at bedtime. (Patient taking differently: Take 10 mg by mouth 3 (three) times daily as needed for spasms.), Disp: 60 capsule, Rfl: 1   furosemide  (LASIX ) 20 MG tablet, Take 1 tablet (20 mg total) by mouth daily for 5 days., Disp: 30 tablet, Rfl: 3   gabapentin  (NEURONTIN ) 600 MG tablet, TAKE 1 TABLET BY MOUTH AT BEDTIME., Disp: 60 tablet, Rfl: 3   ipratropium-albuterol  (DUONEB) 0.5-2.5 (3) MG/3ML SOLN, Take 3 mLs by nebulization every 4 (four) hours as needed (shortness of breath, wheezing, or cough)., Disp: 360 mL, Rfl: 2   levothyroxine  (SYNTHROID ) 88 MCG tablet, TAKE 1 TABLET BY MOUTH DAILY BEFORE BREAKFAST., Disp: 90 tablet, Rfl: 2   metFORMIN  (GLUCOPHAGE -XR) 500 MG 24 hr tablet, Take 1 tablet (500 mg total) by mouth daily with breakfast., Disp: 90 tablet, Rfl: 3   mupirocin  cream  (BACTROBAN ) 2 %, APPLY TO AFFECTED AREA TWICE A DAY (Patient not taking: Reported on 07/08/2023), Disp: 15 g, Rfl: 1   nystatin cream (MYCOSTATIN), Apply 1 Application topically 2 (two) times daily. (Patient not taking: Reported on 07/08/2023), Disp: , Rfl:    pantoprazole  (PROTONIX ) 40 MG tablet, Take 1 tablet (40 mg total) by mouth daily., Disp: 30 tablet, Rfl: 0   promethazine  (PHENERGAN ) 25 MG tablet, Take 1 tablet (25 mg total) by mouth every 8 (eight) hours as needed for nausea or vomiting., Disp: 30 tablet, Rfl: 3   propranolol  (INDERAL ) 80 MG tablet, Take 1 tablet (80 mg total) by mouth daily., Disp: 90 tablet, Rfl: 3   sodium chloride  HYPERTONIC 3 % nebulizer solution, Take by nebulization as needed for cough. Use 3 mL Twice daily until symptoms improve, Disp: 75 mL, Rfl: 5   umeclidinium-vilanterol (ANORO ELLIPTA ) 62.5-25 MCG/ACT AEPB, Inhale 1 puff into the lungs  daily. (Patient not taking: Reported on 07/08/2023), Disp: 60 each, Rfl: 3

## 2023-07-15 ENCOUNTER — Other Ambulatory Visit (HOSPITAL_COMMUNITY): Payer: Self-pay | Admitting: Pulmonary Disease

## 2023-07-15 ENCOUNTER — Telehealth (HOSPITAL_BASED_OUTPATIENT_CLINIC_OR_DEPARTMENT_OTHER): Payer: Self-pay

## 2023-07-15 DIAGNOSIS — R1310 Dysphagia, unspecified: Secondary | ICD-10-CM

## 2023-07-15 DIAGNOSIS — J479 Bronchiectasis, uncomplicated: Secondary | ICD-10-CM

## 2023-07-15 DIAGNOSIS — R059 Cough, unspecified: Secondary | ICD-10-CM

## 2023-07-15 DIAGNOSIS — B3731 Acute candidiasis of vulva and vagina: Secondary | ICD-10-CM

## 2023-07-15 LAB — RNP ANTIBODIES: ENA RNP Ab: 0.6 AI (ref 0.0–0.9)

## 2023-07-15 NOTE — Telephone Encounter (Signed)
 Copied from CRM [id number]. Topic: Clinical - Prescription Issue >> Jul 15, 2023  4:57 PM Sabrina Roberson wrote: Reason for CRM: Pt recently saw Dr. Diania Fortes and was put on a new inhaler Glycopyrrolate-Formoterol (BEVESPI AEROSPHERE) 9-4.8 MCG/ACT AERO. Pt stated even with the discounted price it is $150+ out of pocket and the pt stated that it was too expensive. Pt is wanting to make sure Dr. Diania Fortes is aware because he told her to let him know, and he would see about switching the inhaler for a possible alternative. Pt's phone number is [phone] ok to leave a vm.

## 2023-07-16 ENCOUNTER — Telehealth: Payer: Self-pay

## 2023-07-16 ENCOUNTER — Other Ambulatory Visit (HOSPITAL_COMMUNITY): Payer: Self-pay

## 2023-07-16 NOTE — Telephone Encounter (Signed)
 All options of LABA/LAMA at this time are showing as $186.33 due to the patient having a deductible to meet. Once this has been met the price should go down to $47.00

## 2023-07-17 NOTE — Telephone Encounter (Signed)
 Pt stated she is not able to meet her deductible and wanted to know if its okay for her to use her rescue inhaler as her daily inhaler Pt also states that her amoxicillin  that was prescribed to her is causing vaginal irritation and wants to know what she can do or if she should stop taking it   Dr Diania Fortes, please advise

## 2023-07-18 ENCOUNTER — Telehealth: Payer: Self-pay

## 2023-07-18 MED ORDER — FLUCONAZOLE 200 MG PO TABS: 200.0000 mg | ORAL_TABLET | Freq: Every day | ORAL | 0 refills | Status: DC

## 2023-07-18 NOTE — Telephone Encounter (Signed)
 Copied from CRM [id number]. Topic: Clinical - Prescription Issue >> Jul 18, 2023 12:11 PM Ilene Malling wrote: Patient [phone] is returning a phone call from the office unsure if it's regarding inhaler patient cannot afford, or test results. Per CAL, CMA is unable, send a message. Please call back.

## 2023-07-18 NOTE — Telephone Encounter (Signed)
 Lm on Pt mother Phone # okay per HIPAA with treatment info and Rx sent to pharmacy.   NFN

## 2023-07-18 NOTE — Addendum Note (Signed)
 Addended by: Aryiana Klinkner on: 07/18/2023 10:47 AM   Modules accepted: Orders

## 2023-07-18 NOTE — Telephone Encounter (Signed)
 She can use her duoneb treatments 3 times per day which would replace her maintenance inhaler.   I sent in fluconazole for the vaginal irritation. Can stop the augmentin  early if needed.  JD

## 2023-07-18 NOTE — Telephone Encounter (Signed)
 Copied from CRM [id number]. Topic: Clinical - Prescription Issue >> Jul 15, 2023  4:57 PM Tyronne Galloway wrote: Reason for CRM: Pt recently saw Dr. Diania Fortes and was put on a new inhaler Glycopyrrolate-Formoterol (BEVESPI AEROSPHERE) 9-4.8 MCG/ACT AERO. Pt stated even with the discounted price it is $150+ out of pocket and the pt stated that it was too expensive. Pt is wanting to make sure Dr. Diania Fortes is aware because he told her to let him know, and he would see about switching the inhaler for a possible alternative. Pt's phone number is [phone] ok to leave a vm. >> Jul 18, 2023 12:11 PM Ilene Malling wrote: Patient [phone] is returning a phone call from the office unsure if it's regarding inhaler patient cannot afford, or test results. Per CAL, CMA is unable, send a message. Please call back.  ATC X1 LVM for patient to call our office back regarding prior message.

## 2023-07-20 ENCOUNTER — Encounter: Payer: Self-pay | Admitting: Pulmonary Disease

## 2023-07-21 LAB — RHEUMATOID FACTOR: Rheumatoid fact SerPl-aCnc: <10 [IU]/mL (ref <14)

## 2023-07-21 LAB — SJOGREN'S SYNDROME ANTIBODS(SSA + SSB)
SSA (Ro) (ENA) Antibody, IgG: <1 AI
SSB (La) (ENA) Antibody, IgG: <1 AI

## 2023-07-21 LAB — ANTI-NUCLEAR AB-TITER (ANA TITER): ANA Titer 1: 1:40 {titer} — ABNORMAL HIGH

## 2023-07-21 LAB — CYCLIC CITRUL PEPTIDE ANTIBODY, IGG: Cyclic Citrullin Peptide Ab: <16 U

## 2023-07-21 LAB — RFLX ATYPICAL P-ANCA TITER: ATYPICAL P ANCA TITER: 1:20 {titer} — ABNORMAL HIGH

## 2023-07-21 LAB — ANTI-SMITH ANTIBODY: ENA SM Ab Ser-aCnc: <1 AI

## 2023-07-21 LAB — ANA: Anti Nuclear Antibody (ANA): POSITIVE — AB

## 2023-07-21 LAB — ANCA SCREEN W REFLEX TITER: ANCA SCREEN: POSITIVE — AB

## 2023-07-22 NOTE — Telephone Encounter (Signed)
 Per previous request on inhalers:  All options of LABA/LAMA at this time are showing as $186.33 due to the patient having a deductible to meet. Once this has been met the price should go down to $47.00

## 2023-07-22 NOTE — Telephone Encounter (Signed)
ATC x1 LVM for patient to call our office back regarding prior message.  

## 2023-07-23 ENCOUNTER — Telehealth: Payer: Self-pay

## 2023-07-23 ENCOUNTER — Ambulatory Visit (HOSPITAL_BASED_OUTPATIENT_CLINIC_OR_DEPARTMENT_OTHER)

## 2023-07-23 NOTE — Telephone Encounter (Signed)
 Copied from CRM [id number]. Topic: Clinical - Lab/Test Results >> Jul 21, 2023 12:41 PM Shona S wrote: Reason for CRM: patient would like to know her lab test results, as well, would like to speak about her medication.  Please see encounter/ mychart as of 07-18-23. nfn

## 2023-07-24 ENCOUNTER — Ambulatory Visit (HOSPITAL_BASED_OUTPATIENT_CLINIC_OR_DEPARTMENT_OTHER)

## 2023-07-24 ENCOUNTER — Other Ambulatory Visit (HOSPITAL_COMMUNITY): Payer: Self-pay | Admitting: Family Medicine

## 2023-07-24 ENCOUNTER — Other Ambulatory Visit (HOSPITAL_BASED_OUTPATIENT_CLINIC_OR_DEPARTMENT_OTHER): Payer: Self-pay

## 2023-07-24 ENCOUNTER — Other Ambulatory Visit (HOSPITAL_BASED_OUTPATIENT_CLINIC_OR_DEPARTMENT_OTHER): Payer: Self-pay | Admitting: Family Medicine

## 2023-07-25 LAB — FUNGUS CULTURE WITH STAIN

## 2023-07-25 LAB — BASIC METABOLIC PANEL WITH GFR
BUN/Creatinine Ratio: 14 (ref 9–23)
BUN: 15 mg/dL (ref 6–24)
CO2: 27 mmol/L (ref 20–29)
Calcium: 9.1 mg/dL (ref 8.7–10.2)
Chloride: 101 mmol/L (ref 96–106)
Creatinine, Ser: 1.11 mg/dL — ABNORMAL HIGH (ref 0.57–1.00)
Glucose: 83 mg/dL (ref 70–99)
Potassium: 5 mmol/L (ref 3.5–5.2)
Sodium: 143 mmol/L (ref 134–144)
eGFR: 61 mL/min/{1.73_m2} (ref >59)

## 2023-07-25 LAB — FUNGAL ORGANISM REFLEX

## 2023-07-27 LAB — FUNGUS CULTURE WITH STAIN

## 2023-07-27 LAB — FUNGAL ORGANISM REFLEX

## 2023-07-27 LAB — FUNGUS CULTURE RESULT

## 2023-07-29 ENCOUNTER — Ambulatory Visit (HOSPITAL_BASED_OUTPATIENT_CLINIC_OR_DEPARTMENT_OTHER): Payer: Self-pay | Admitting: Family Medicine

## 2023-07-29 NOTE — Progress Notes (Signed)
 BMP is stable. Continue clear hydration. Sodium improved to baseline.

## 2023-07-30 ENCOUNTER — Ambulatory Visit (HOSPITAL_COMMUNITY)
Admission: RE | Admit: 2023-07-30 | Discharge: 2023-07-30 | Disposition: A | Discharge status: Home / Self Care | Source: Ambulatory Visit | Attending: *Deleted | Admitting: *Deleted

## 2023-07-30 DIAGNOSIS — R1312 Dysphagia, oropharyngeal phase: Secondary | ICD-10-CM | POA: Diagnosis present

## 2023-07-30 DIAGNOSIS — K219 Gastro-esophageal reflux disease without esophagitis: Secondary | ICD-10-CM | POA: Diagnosis not present

## 2023-07-30 DIAGNOSIS — J471 Bronchiectasis with (acute) exacerbation: Secondary | ICD-10-CM | POA: Insufficient documentation

## 2023-07-30 DIAGNOSIS — I129 Hypertensive chronic kidney disease with stage 1 through stage 4 chronic kidney disease, or unspecified chronic kidney disease: Secondary | ICD-10-CM | POA: Insufficient documentation

## 2023-07-30 DIAGNOSIS — N189 Chronic kidney disease, unspecified: Secondary | ICD-10-CM | POA: Diagnosis not present

## 2023-07-30 DIAGNOSIS — E1122 Type 2 diabetes mellitus with diabetic chronic kidney disease: Secondary | ICD-10-CM | POA: Diagnosis not present

## 2023-07-30 DIAGNOSIS — J45909 Unspecified asthma, uncomplicated: Secondary | ICD-10-CM | POA: Insufficient documentation

## 2023-07-30 DIAGNOSIS — R1310 Dysphagia, unspecified: Secondary | ICD-10-CM

## 2023-07-30 DIAGNOSIS — R059 Cough, unspecified: Secondary | ICD-10-CM

## 2023-07-30 DIAGNOSIS — Z87891 Personal history of nicotine dependence: Secondary | ICD-10-CM | POA: Insufficient documentation

## 2023-07-30 NOTE — Progress Notes (Signed)
 Modified Barium Swallow Study  Patient Details  Name: Sabrina Roberson MRN: [id number] Date of Birth: [birth year]  Today's Date: 07/30/2023  Modified Barium Swallow completed.  Full report located under Chart Review in the Imaging Section.  History of Present Illness Patient is a 53 year old F presenting for MBS per request of Pulmonary & Critical Care MD. Patient with a hx of smoking with DMII, GERD, depression/anxiety, hypertension, CKD and asthma. Patient with recent dx of MRSA PNA and bronchiectasis. Patient reporting increased difficulty swallowing after recent dental work and occasional coughing with PO.   Clinical Impression Patient presents with moderate oropharyngeal dysphagia. Oral phase is remarkable for limited dentition and reduced lingual control/coordination resulting in posterior spillage to the pyriform sinuses before swallow initiation. Pharyngeal phase is remarkable for x1 instance of mistiming and x1 instance of incomplete laryngeal vestibular closure and epiglottic inversion. These deficits resulted in audible aspiration x1 on first sip of thin liquids and x1 during consecutive sips of thins via straw. Aspiration was minimal and cleared with immediate cough. No penetration/aspiration present during remaining small, single sips of thin liquids. No penetration/aspiration present across remaining consistencies observed.  Patient with trace-mild diffuse oropharyngeal residuals, cleared through spontaneous additional swallows. Patient reports feeling things were 'stuck in her throat' during study which may be due to presence of suspected CP bar and cervical esophageal web. Recommend ENT consult. Patient with instance of suspected retrograde flow below the level of the UES. Pill consumed WFL in puree. Recommend continuation of regular/thin diet as patients cough response is effective in clearance of aspirate. Patient should use of standardized swallowing precautions (small bites/sips via cup  edge, sitting upright during PO) and intermittent throat clears. Recommend medications whole in puree.   DIGEST Swallow Severity Rating*  Safety: 2  Efficiency: 1  Overall Pharyngeal Swallow Severity: moderate 1: mild; 2: moderate; 3: severe; 4: profound  *The Dynamic Imaging Grade of Swallowing Toxicity is standardized for the head and neck cancer population, however, demonstrates promising clinical applications across populations to standardize the clinical rating of pharyngeal swallow safety and severity.   Factors that may increase risk of adverse event in presence of aspiration Noe & Lianne 2021): Poor general health and/or compromised immunity  Swallow Evaluation Recommendations Recommendations: PO diet PO Diet Recommendation: Regular;Thin liquids (Level 0) Liquid Administration via: Cup Medication Administration: Whole meds with puree Supervision: Patient able to self-feed Swallowing strategies  : Minimize environmental distractions;Slow rate;Small bites/sips;Clear throat intermittently Postural changes: Position pt fully upright for meals;Stay upright 30-60 min after meals Oral care recommendations: Oral care BID (2x/day) Recommended consults: Consider ENT consultation   Winford Hehn M.A., CCC-SLP 07/30/2023,12:41 PM

## 2023-07-31 ENCOUNTER — Ambulatory Visit (HOSPITAL_BASED_OUTPATIENT_CLINIC_OR_DEPARTMENT_OTHER): Admitting: Family Medicine

## 2023-07-31 ENCOUNTER — Telehealth: Payer: Self-pay | Admitting: *Deleted

## 2023-07-31 DIAGNOSIS — R1310 Dysphagia, unspecified: Secondary | ICD-10-CM

## 2023-07-31 NOTE — Telephone Encounter (Signed)
 She does have aspiration risk based on the swallow study.  I do recommend she get in to see her dentist earlier if able.  Recommend referral to ENT for dysphagia per the Speech eval note. Referral order placed.  Thanks, JD

## 2023-07-31 NOTE — Telephone Encounter (Signed)
 Copied from CRM [id number]. Topic: General - Other >> Jul 31, 2023  2:57 PM Rilla B wrote: Reason for CRM: Patient returning call and would like a call back.  Please call patient.  I called and spoke with the pt  I set her 3 mo rov up for 10/22/23  She is asking if Dr. Kara can look at her swallowing eval and advise on results  She states that she has appt with dentist in August and thinks that based on our results, she may need sooner appt with them  She states that she and Dewald talked about this at the last visit and if we needed to, we will get her sooner appt for dental issue  Please advise, thanks!

## 2023-07-31 NOTE — Telephone Encounter (Signed)
 Copied from CRM [id number]. Topic: Clinical - Request for Lab/Test Order >> Jul 30, 2023  3:05 PM Devaughn RAMAN wrote: Reason for CRM: Patient called regarding swallow test results and the potential infection, patient would like to know if the process of the removal of her teeth. Patient has an potential infection in her gum and her infection from the pneumonia. Patient would like to know if she needs a sooner appointment due to the infection. Patient was thankful and verbalized understanding.   Called the pt and there was no answer- LMTCB and will route to [place] to f/u

## 2023-08-04 ENCOUNTER — Ambulatory Visit: Payer: Self-pay

## 2023-08-04 ENCOUNTER — Encounter (INDEPENDENT_AMBULATORY_CARE_PROVIDER_SITE_OTHER): Payer: Self-pay

## 2023-08-04 NOTE — Telephone Encounter (Signed)
 Patient would like provider to review Bronchoscopy results, and give her a call to discuss abnormal findings.                     Copied from CRM [id number]. Topic: Clinical - Red Word Triage >> Aug 04, 2023  4:11 PM Joesph PARAS wrote: Red Word that prompted transfer to Nurse Triage: Patient is calling to request that she be called and notified of bronchoscopy results. States they are popping up abnormal but she has heard nothing.   States is finishing medicine and is coughing, airway is closing up, coughing again, airway is opening, producing thick green phlegm.   Patient blending topics of care and slightly difficult to understand first time around.

## 2023-08-04 NOTE — Telephone Encounter (Signed)
  FYI Only or Action Required?: Action required by provider: referral request.  Patient was last seen in primary care on 07/08/2023 by Knute Thersia Bitters, FNP. Called Nurse Triage reporting Rash. Symptoms began today. Interventions attempted: Nothing. Symptoms are: stable.  Triage Disposition: updated order needed for ultrasound  Patient/caregiver understands and will follow disposition?: Yes Copied from CRM [id number]. Topic: Clinical - Medical Advice >> Aug 04, 2023  9:27 AM Tobias CROME wrote: Reason for CRM: Patient has rash on right arm, patient noticed rash on Saturday 7/5. Patient also stats she has a knott on right arm under armpit. Patient has been seeing PCP about knotts in left arm.   Patient inquiring if order for ultrasound can be adjusted to include both arms.   Pt seeking clinical advise, [phone] Reason for Disposition  [1] Caller requesting NON-URGENT health information AND [2] PCP's office is the best resource  Answer Assessment - Initial Assessment Questions 1. REASON FOR CALL or QUESTION: What is your reason for calling today? or How can I best help you? or What question do you have that I can help answer?     Patient calling to report that lumps have returned  under her right armpit and now are under left armpit as well. She would like for her ultrasound order to be updated to include both her right and left armpit  Protocols used: Information Only Call - No Triage-A-AH

## 2023-08-06 NOTE — Telephone Encounter (Signed)
 The autoimmune labs were low positive, so nothing to worry about at this time.   Dr. Kara

## 2023-08-06 NOTE — Telephone Encounter (Signed)
 I called and spoke with the pt and notified of response from Dr. Kara.  She verbalized understanding. She is asking for results of her labs drawn at last visit here 07/14/23  She is concerned since there were some abnormalities that she seen on her mychart  Please advise, thanks!

## 2023-08-06 NOTE — Telephone Encounter (Signed)
 Called and spoke with the pt regarding Dr. Ples previous note. Pt verbalized understanding & had no other concerns. Nothing further needed.

## 2023-08-07 ENCOUNTER — Ambulatory Visit (HOSPITAL_BASED_OUTPATIENT_CLINIC_OR_DEPARTMENT_OTHER): Admitting: Family Medicine

## 2023-08-11 LAB — ACID FAST CULTURE WITH REFLEXED SENSITIVITIES (MYCOBACTERIA)
Acid Fast Culture: NEGATIVE
Acid Fast Culture: NEGATIVE

## 2023-08-14 ENCOUNTER — Other Ambulatory Visit (HOSPITAL_BASED_OUTPATIENT_CLINIC_OR_DEPARTMENT_OTHER): Payer: Self-pay | Admitting: Family Medicine

## 2023-09-01 ENCOUNTER — Encounter (HOSPITAL_BASED_OUTPATIENT_CLINIC_OR_DEPARTMENT_OTHER): Payer: Self-pay | Admitting: Family Medicine

## 2023-09-01 ENCOUNTER — Ambulatory Visit (HOSPITAL_BASED_OUTPATIENT_CLINIC_OR_DEPARTMENT_OTHER)
Admission: RE | Admit: 2023-09-01 | Discharge: 2023-09-01 | Disposition: A | Discharge status: Home / Self Care | Source: Ambulatory Visit | Attending: Family Medicine | Admitting: Family Medicine

## 2023-09-01 ENCOUNTER — Encounter (HOSPITAL_BASED_OUTPATIENT_CLINIC_OR_DEPARTMENT_OTHER): Payer: Self-pay | Admitting: *Deleted

## 2023-09-01 ENCOUNTER — Ambulatory Visit (HOSPITAL_BASED_OUTPATIENT_CLINIC_OR_DEPARTMENT_OTHER): Admitting: Family Medicine

## 2023-09-01 ENCOUNTER — Ambulatory Visit (HOSPITAL_BASED_OUTPATIENT_CLINIC_OR_DEPARTMENT_OTHER)

## 2023-09-01 VITALS — BP 118/73 | HR 69 | Ht 64.0 in | Wt 156.8 lb

## 2023-09-01 DIAGNOSIS — F22 Delusional disorders: Secondary | ICD-10-CM | POA: Insufficient documentation

## 2023-09-01 DIAGNOSIS — L739 Follicular disorder, unspecified: Secondary | ICD-10-CM

## 2023-09-01 DIAGNOSIS — M545 Low back pain, unspecified: Secondary | ICD-10-CM

## 2023-09-01 DIAGNOSIS — R278 Other lack of coordination: Secondary | ICD-10-CM | POA: Insufficient documentation

## 2023-09-01 DIAGNOSIS — M25551 Pain in right hip: Secondary | ICD-10-CM

## 2023-09-01 DIAGNOSIS — E039 Hypothyroidism, unspecified: Secondary | ICD-10-CM | POA: Diagnosis not present

## 2023-09-01 DIAGNOSIS — R062 Wheezing: Secondary | ICD-10-CM | POA: Diagnosis not present

## 2023-09-01 DIAGNOSIS — R053 Chronic cough: Secondary | ICD-10-CM

## 2023-09-01 DIAGNOSIS — E118 Type 2 diabetes mellitus with unspecified complications: Secondary | ICD-10-CM | POA: Diagnosis not present

## 2023-09-01 DIAGNOSIS — J189 Pneumonia, unspecified organism: Secondary | ICD-10-CM | POA: Diagnosis not present

## 2023-09-01 DIAGNOSIS — M25552 Pain in left hip: Secondary | ICD-10-CM

## 2023-09-01 DIAGNOSIS — W19XXXA Unspecified fall, initial encounter: Secondary | ICD-10-CM

## 2023-09-01 LAB — POCT UA - MICROALBUMIN
Creatinine, POC: 50 mg/dL
Microalbumin Ur, POC: 80 mg/L

## 2023-09-01 LAB — POCT URINALYSIS DIP (CLINITEK)
Bilirubin, UA: NEGATIVE
Glucose, UA: NEGATIVE mg/dL
Ketones, POC UA: NEGATIVE mg/dL
Leukocytes, UA: NEGATIVE
Nitrite, UA: NEGATIVE
POC PROTEIN,UA: NEGATIVE
Spec Grav, UA: 1.025 (ref 1.010–1.025)
Urobilinogen, UA: 0.2 U/dL
pH, UA: 6 (ref 5.0–8.0)

## 2023-09-01 MED ORDER — MUPIROCIN 2 % EX OINT: TOPICAL_OINTMENT | CUTANEOUS | 3 refills | Status: DC

## 2023-09-01 NOTE — Progress Notes (Addendum)
 "   Established Patient Office Visit  Subjective:   Sabrina Roberson 1970-12-03 09/01/2023  Chief Complaint  Patient presents with   Depression    3-week follow up; pt states she is seeing a psychiatrist now and states her medication was upped. States she fell about 2 weeks ago and hit her head and landed on right side of her body. Since the fall, her body has been sore and she has been having swelling in feet.    Discussed the use of AI scribe software for clinical note transcription with the patient, who gave verbal consent to proceed.  History of Present Illness Sabrina Roberson is a 53 year old female with depression and paranoia who presents with worsening psychiatric symptoms and a recent fall.  Since June, she has experienced worsening psychiatric symptoms, including depression and paranoia. Despite being started on Abilify  and weaned off Wellbutrin , she continues to have paranoia, such as thoughts of being recorded and misinterpreting situations. Her mood has not improved with these medication changes, and she continues to experience thoughts of people listening or watching her. She has established with [facility] in [place] and has an upcoming appt in August for follow up. Her Abilify  has been increased in the past week to 5mg  and she has started this increased dosage over the weekend.   She had a recent mechanical fall last Tuesday while standing on a chair to reach for a Christmas ornament on an entertainment center. During the fall, she hit her head and experienced pain in her tailbone, left arm, right side of her neck, and head. She describes a sensation 'like electricity' going through her body and has since experienced blurred vision and confusion, which began two days after the fall when she took trazodone . These symptoms have persisted since last week.Her husband accompanies her today.  He reports a history of psychiatric illness when they first were married, but  does not expound on this with any specific diagnosis.  She is currently taking Abilify , which was recently increased to 5 mg by her psychiatrist, and Valium , which she takes once a day as needed. She experiences drowsiness as a side effect of her medications.   Her husband notes that she has been experiencing chest congestion with thick, brownish-green sputum, and she has been working with Pulmonology to address this issue. Patient had a history of MRSA pneumonia in Dec/Jan and is currently being evaluated for possible aspiration risk causing recurring pulmonary congestion. She had a recent bronchoscopy which was unremarkable to possible abscess or recurring pneumonia.   FOLLICULITIS:  Patient reports recurring rash to bilateral axillae for several week. She states the area begins as bumps and can become painful and raised. Denies itching, drainage, new cosmetic use or lotions.    The following portions of the patient's history were reviewed and updated as appropriate: past medical history, past surgical history, family history, social history, allergies, medications, and problem list.   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   Severe episode of recurrent major depressive disorder, with psychotic features (HCC) 07/08/2023   Paranoid behavior (HCC) 07/08/2023   Lymphadenopathy, axillary 07/08/2023   Recurrent pneumonia 06/27/2023   Bronchiectasis without complication (HCC) 06/10/2023   Localized swelling of both lower legs 05/29/2023   Pneumonia due to methicillin resistant Staphylococcus aureus (MRSA) (HCC) 04/03/2023   Physical deconditioning 02/17/2023   Bilateral lower extremity edema 12/03/2022   Bilateral carotid bruits  11/04/2022   CKD (chronic kidney disease) stage 2, GFR 60-89 ml/min 11/04/2022   Mixed hyperlipidemia 10/10/2022   GAD (generalized anxiety disorder) 10/10/2022   Acquired hypothyroidism  10/10/2022   Hypertension    Type 2 diabetes mellitus with diabetic chronic kidney disease (HCC)    Primary insomnia 09/18/2022   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   DOE (dyspnea on exertion) 12/21/2013   Benzodiazepine dependence, episodic (HCC) 12/23/2011   DDD (degenerative disc disease), cervical 12/23/2011   DDD (degenerative disc disease), lumbosacral 12/23/2011   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   Fibromyalgia 02/11/2011   Past Medical History:  Diagnosis Date   Abdominal pain 02/11/2011   Abnormal CT of the chest 02/04/2014   CT chest 08/2013:  atx in RML and lingula  CT angio 12/2013:  No PE, RML scarring, abnormal density in lingula  (done at [facility])     Allergy    Anxiety    Arthritis    Asthma    Bruises easily    Chronic chest wall pain    Chronic pain    Cough    Depression    Diabetes mellitus without complication (HCC)    Dyspnea    chronic   Dysuria 09/18/2022   Encounter for therapeutic drug monitoring 02/11/2011   Fibromyalgia    Functional movement disorder    GERD (gastroesophageal reflux disease)    Headache    Hearing loss    High cholesterol    History of hiatal hernia    Hypertension    Hypothyroidism    Kidney disease    stage 3   Leg swelling    both   Nausea 02/11/2011   Nausea and vomiting 04/01/2013   Pain management    PONV (postoperative nausea and vomiting)    Sebaceous cyst of breast, right subaereolar 10/30/2010   Septic shock (HCC) 01/19/2023   SMAS (superior mesenteric artery syndrome) (HCC)    Thyroid  disease    TIA (transient ischemic attack)    Tic disorder    TMJ (dislocation of temporomandibular joint)    Transient cerebral ischemia    Type 2 diabetes mellitus with complications (HCC)    Past Surgical History:  Procedure Laterality Date   AORTA - SUPERIOR MESENTERIC AND AORTA - RENAL ARTERY BYPASS GRAFT     APPENDECTOMY     BREAST CYST EXCISION Right    BREAST EXCISIONAL BIOPSY      CHOLECYSTECTOMY     cyst removed     FLEXIBLE BRONCHOSCOPY Bilateral 06/27/2023   Procedure: BRONCHOSCOPY, FLEXIBLE;  Surgeon: Kara Dorn NOVAK, MD;  Location: [facility] ENDOSCOPY;  Service: Pulmonary;  Laterality: Bilateral;   HERNIA REPAIR     OOPHORECTOMY     bilateral   TEE WITHOUT CARDIOVERSION N/A 06/18/2012   Procedure: TRANSESOPHAGEAL ECHOCARDIOGRAM (TEE);  Surgeon: Jerel Balding, MD;  Location: [facility] ENDOSCOPY;  Service: Cardiovascular;  Laterality: N/A;   TOTAL ABDOMINAL HYSTERECTOMY     Family History  Problem Relation Age of Onset   Asthma Father    Breast cancer Sister    Hypertension Sister    Parkinson's disease Maternal Aunt    Pulmonary fibrosis Maternal Grandmother    Emphysema Maternal Grandmother    Heart disease Maternal Grandmother    Breast cancer Maternal Grandmother    Asthma Maternal Grandfather    Heart disease Maternal Grandfather    Cancer Paternal Grandmother        breast   Heart disease Paternal Grandmother  Breast cancer Paternal Grandmother    Asthma Paternal Grandmother    Leukemia Paternal Grandfather    Heart disease Paternal Grandfather    Clotting disorder Paternal Grandfather    Social History   Socioeconomic History   Marital status: Married    Spouse name: Not on file   Number of children: 2   Years of education: Not on file   Highest education level: 12th grade  Occupational History   Occupation: disabled  Tobacco Use   Smoking status: Former    Current packs/day: 0.00    Average packs/day: 0.5 packs/day for 15.0 years (7.5 ttl pk-yrs)    Types: Cigarettes    Quit date: 01/19/2023    Years since quitting: 0.6   Smokeless tobacco: Never  Vaping Use   Vaping status: Former  Substance and Sexual Activity   Alcohol use: No   Drug use: No   Sexual activity: Not Currently    Partners: Male    Birth control/protection: None  Other Topics Concern   Not on file  Social History Narrative   Not on file   Social Drivers of  Health   Financial Resource Strain: Low Risk  (03/02/2023)   Overall Financial Resource Strain (CARDIA)    Difficulty of Paying Living Expenses: Not hard at all  Food Insecurity: No Food Insecurity (03/02/2023)   Hunger Vital Sign    Worried About Running Out of Food in the Last Year: Never true    Ran Out of Food in the Last Year: Never true  Transportation Needs: No Transportation Needs (03/02/2023)   PRAPARE - Administrator, Civil Service (Medical): No    Lack of Transportation (Non-Medical): No  Physical Activity: Inactive (03/02/2023)   Exercise Vital Sign    Days of Exercise per Week: 0 days    Minutes of Exercise per Session: 20 min  Stress: Stress Concern Present (03/02/2023)   [employer] of Occupational Health - Occupational Stress Questionnaire    Feeling of Stress : Very much  Social Connections: Moderately Isolated (03/04/2023)   Social Connection and Isolation Panel    Frequency of Communication with Friends and Family: More than three times a week    Frequency of Social Gatherings with Friends and Family: More than three times a week    Attends Religious Services: Never    Database Administrator or Organizations: No    Attends Banker Meetings: Never    Marital Status: Married  Catering Manager Violence: Not At Risk (02/17/2023)   Humiliation, Afraid, Rape, and Kick questionnaire    Fear of Current or Ex-Partner: No    Emotionally Abused: No    Physically Abused: No    Sexually Abused: No   Outpatient Medications Prior to Visit  Medication Sig Dispense Refill   albuterol  (VENTOLIN  HFA) 108 (90 Base) MCG/ACT inhaler TAKE 2 PUFFS BY MOUTH EVERY 6 HOURS AS NEEDED FOR WHEEZE OR SHORTNESS OF BREATH 8.5 each 2   ARIPiprazole  (ABILIFY ) 5 MG tablet Take 5 mg by mouth daily.     atorvastatin  (LIPITOR) 20 MG tablet Take 1 tablet (20 mg total) by mouth at bedtime. 90 tablet 3   diazepam  (VALIUM ) 5 MG tablet TAKE 1 TABLET (5 MG TOTAL) BY MOUTH TWICE A DAY  AS NEEDED FOR ANXIETY 30 tablet 3   dicyclomine  (BENTYL ) 10 MG capsule Take 1 capsule (10 mg total) by mouth 4 (four) times daily -  before meals and at bedtime. (Patient taking differently: Take  10 mg by mouth 3 (three) times daily as needed for spasms.) 60 capsule 1   furosemide  (LASIX ) 20 MG tablet Take 1 tablet (20 mg total) by mouth daily for 5 days. 30 tablet 3   gabapentin  (NEURONTIN ) 600 MG tablet TAKE 1 TABLET BY MOUTH AT BEDTIME. 60 tablet 3   Glycopyrrolate-Formoterol (BEVESPI  AEROSPHERE) 9-4.8 MCG/ACT AERO Inhale 2 puffs into the lungs 2 (two) times daily. 10.7 g 5   ipratropium-albuterol  (DUONEB) 0.5-2.5 (3) MG/3ML SOLN Take 3 mLs by nebulization every 4 (four) hours as needed (shortness of breath, wheezing, or cough). 360 mL 2   levothyroxine  (SYNTHROID ) 88 MCG tablet TAKE 1 TABLET BY MOUTH DAILY BEFORE BREAKFAST. 90 tablet 2   metFORMIN  (GLUCOPHAGE -XR) 500 MG 24 hr tablet Take 1 tablet (500 mg total) by mouth daily with breakfast. 90 tablet 3   pantoprazole  (PROTONIX ) 40 MG tablet Take 1 tablet (40 mg total) by mouth daily. 30 tablet 0   promethazine  (PHENERGAN ) 25 MG tablet Take 1 tablet (25 mg total) by mouth every 8 (eight) hours as needed for nausea or vomiting. 30 tablet 3   propranolol  (INDERAL ) 80 MG tablet Take 1 tablet (80 mg total) by mouth daily. 90 tablet 3   sodium chloride  HYPERTONIC 3 % nebulizer solution Take by nebulization as needed for cough. Use 3 mL Twice daily until symptoms improve 75 mL 5   traZODone  (DESYREL ) 100 MG tablet Take 100 mg by mouth at bedtime.     ARIPiprazole  (ABILIFY ) 2 MG tablet Take 1 tablet (2 mg total) by mouth daily. 30 tablet 3   amoxicillin -clavulanate (AUGMENTIN ) 875-125 MG tablet Take 1 tablet by mouth 2 (two) times daily. 28 tablet 0   fluconazole  (DIFLUCAN ) 200 MG tablet Take 1 tablet (200 mg total) by mouth daily. 1 tablet 0   mupirocin  cream (BACTROBAN ) 2 % APPLY TO AFFECTED AREA TWICE A DAY (Patient not taking: Reported on 07/08/2023)  15 g 1   nystatin cream (MYCOSTATIN) Apply 1 Application topically 2 (two) times daily. (Patient not taking: Reported on 07/08/2023)     No facility-administered medications prior to visit.   Allergies  Allergen Reactions   Latex Anaphylaxis and Swelling   Codeine Nausea And Vomiting    Other reaction(s): GI Upset (intolerance), Vomiting (intolerance)   Ibuprofen Other (See Comments)    GI upset, burning sensation   Oxycodone  Nausea And Vomiting    Pre-medication with phenergan  needed.   Tape Itching and Rash    Please use paper tape only.    ROS: A complete ROS was performed with pertinent positives/negatives noted in the HPI. The remainder of the ROS are negative.   Objective:   Today's Vitals   09/01/23 1410  BP: 118/73  Pulse: 69  SpO2: 100%  Weight: 156 lb 12.8 oz (71.1 kg)  Height: 5' 4 (1.626 m)    GENERAL: Well-appearing, in NAD. Well nourished.  SKIN: Pink, warm and dry. No , lesion, ulceration, or ecchymoses.  Mild pustular rash to bilateral axilla. Head: Normocephalic. NECK: Trachea midline. Full ROM w/o pain or tenderness. No lymphadenopathy.  EARS: Tympanic membranes are intact, translucent without bulging and without drainage. Appropriate landmarks visualized.  EYES: Conjunctiva clear without exudates. EOMI, PERRL, no drainage present.  NOSE: Septum midline w/o deformity. Nares patent, mucosa pink and non-inflamed w/o drainage. No sinus tenderness.  THROAT: Uvula midline. Oropharynx clear. Tonsils non-inflamed without exudate. Mucous membranes pink and moist.  RESPIRATORY: Chest wall symmetrical. Respirations even and non-labored. Breath sounds clear to  auscultation bilaterally.  CARDIAC: S1, S2 present, regular rate and rhythm without murmur or gallops. Peripheral pulses 2+ bilaterally.  MSK: Muscle tone and strength appropriate for age. Joints w/o tenderness, redness, or swelling. Mild tenderness to lumbar midline spine. No step off, crepitus, or deformity.  Full ROM present.  EXTREMITIES: Without clubbing, cyanosis, or edema.  NEUROLOGIC: No motor or sensory deficits. Steady, even gait. C2-C12 intact.  PSYCH/MENTAL STATUS: Alert, oriented x 3. Flat, withdrawn affect. Judgment is mildly impaired.   Neurological Exam:  Mental status: alert and oriented to person, place, time and self, speech fluent and not dysarthric, language intact.  Fund of knowledge is appropriate.  Recent and remote memory are intact.  Attention and concentration are normal.    Able to name objects and repeat phrases.  Cranial nerves:  CN I: not tested  CN II: pupils equal, round and reactive to light, visual fields intact, fundi unremarkable.  CN III, IV, VI: full range of motion, mild nystagmus to both eyes, no ptosis  CN V: facial sensation intact  CN VII: upper and lower face symmetric  CN VIII: hearing intact  CN IX, X: gag intact, uvula midline  CN XI: sternocleidomastoid and trapezius muscles intact  CN XII: tongue midline  Bulk & Tone: normal, no fasciculations.  Motor: 5/5 throughout  Sensation: pinprick , vibration .  Cerebellar: Moderate dysdiadochokinesia. Romberg failed.  Gait:  slow and cautious, slightly ataxic, unable to tandem walk without assist x 1    Health Maintenance Due  Topic Date Due   OPHTHALMOLOGY EXAM  Never done   Pneumococcal Vaccine: 50+ Years (1 of 2 - PCV) Never done   Hepatitis B Vaccines (1 of 3 - 19+ 3-dose series) Never done   DEXA SCAN  02/08/2017   Zoster Vaccines- Shingrix (1 of 2) Never done   COVID-19 Vaccine (1 - 2024-25 season) Never done    Lab Results  Component Value Date   COLORU yellow 09/01/2023   CLARITYU clear 09/01/2023   GLUCOSEUR negative 09/01/2023   BILIRUBINUR negative 09/01/2023   KETONESU Negative 07/08/2023   SPECGRAV 1.025 09/01/2023   RBCUR trace-intact (A) 09/01/2023   PHUR 6.0 09/01/2023   PROTEINUR Negative 07/08/2023   UROBILINOGEN 0.2 09/01/2023   LEUKOCYTESUR Negative 09/01/2023         Assessment & Plan:  1. Paranoid behavior (HCC) (Primary) PCP had lengthy discussion regarding concerns of paranoid behavior and worsening psychiatric symptoms.  Will obtain CT head given recent fall to rule out any possible acute concerns that may be contributing to her behavior.  I believe this is more psychiatric and will recommend that she schedule an appointment with her psychiatrist within the next 1 to 2 weeks for medication management and counseling.  Urine was negative for any signs of UTI that may be contributing.  Patient and husband were able to contract for safety.  She denies any SI or HI. - CT HEAD WO CONTRAST ( ); Future - POCT URINALYSIS DIP (CLINITEK)  2. Dysdiadochokinesia Possible side effects of psychiatric illness versus acute neurological illness.  Will rule out with CT head.  Patient's gait is steady and unassisted down the hall, but is not able to perform tandem walking without support. - CT HEAD WO CONTRAST ( ); Future  3. Fall, initial encounter 3. Acute midline low back pain without sciatica 4. Right hip pain Will obtain x-rays of right hip and lumbar spine to rule out possible fracture or injury.  Recommend patient not climb on any elevated  surfaces or ladders without assistance or supervision of her family.  Recommend she use an assistive walking device to help prevent falls as well.  Declined prescription for this. - DG Lumbar Spine Complete; Future - DG Hip Unilat W OR W/O Pelvis 1V Right; Future  4. Type 2 diabetes mellitus with complications (HCC) Will obtain A1c, microalbumin today.  Referral placed to ophthalmology for diabetic eye exam needed. - Hemoglobin A1c - POCT UA - Microalbumin - Ambulatory referral to Ophthalmology  5. Acquired hypothyroidism Will obtain TSH to evaluate thyroid  function and stability on levothyroxine  88 mcg. - TSH  6. Folliculitis Follicular irritation with rash present to bilateral axilla.  Will start mupirocin   3 times daily for 7 days.  Recommend she toss out razor blades that she has been using and obtain new ones to prevent spread of bacteria. - mupirocin  ointment (BACTROBAN ) 2 %; Apply to affected area TID for 7 days.  Dispense: 30 g; Refill: 3  7. Chronic cough This is a chronic persistent cough that is currently being managed by pulmonology.  She has an upcoming appointment with the ENT per chart review and I recommend she continue with this to evaluate for possible aspiration concerns.   Patient to reach out to office if new, worrisome, or unresolved symptoms arise or if no improvement in patient's condition. Patient verbalized understanding and is agreeable to treatment plan. All questions answered to patient's satisfaction.   A total of 75 minutes were spent on this encounter today including face to face, ordering and reviewing labs, reviewing patient's previous records from Pulmonology, documenting in the record and ordering  labs, medications and screenings.    Return in about 4 weeks (around 09/29/2023) for Follow up Mental Health .    Thersia Schuyler Stark, FNP "

## 2023-09-01 NOTE — Patient Instructions (Addendum)
 Go downstairs to the 1st floor for CT Imaging    Xray is on the 2nd floor.

## 2023-09-01 NOTE — Progress Notes (Deleted)
 Subjective:   Sabrina Roberson [birth year] 09/01/2023  Chief Complaint  Patient presents with   Depression    3-week follow up; pt states she is seeing a psychiatrist now and states her medication was upped. States she fell about 2 weeks ago and hit her head and landed on right side of her body. Since the fall, her body has been sore and she has been having swelling in feet.    HPI: Sabrina Roberson presents today for re-assessment and management of chronic medical conditions.   The following portions of the patient's history were reviewed and updated as appropriate: past medical history, past surgical history, family history, social history, allergies, medications, and problem list.   Patient Active Problem List   Diagnosis Date Noted   Severe episode of recurrent major depressive disorder, with psychotic features (HCC) 07/08/2023   Paranoid behavior (HCC) 07/08/2023   Lymphadenopathy, axillary 07/08/2023   Recurrent pneumonia 06/27/2023   Bronchiectasis without complication (HCC) 06/10/2023   Localized swelling of both lower legs 05/29/2023   Pneumonia due to methicillin resistant Staphylococcus aureus (MRSA) (HCC) 04/03/2023   Physical deconditioning 02/17/2023   Bilateral lower extremity edema 12/03/2022   Bilateral carotid bruits 11/04/2022   CKD (chronic kidney disease) stage 2, GFR 60-89 ml/min 11/04/2022   Mixed hyperlipidemia 10/10/2022   GAD (generalized anxiety disorder) 10/10/2022   Acquired hypothyroidism 10/10/2022   Hypertension    Type 2 diabetes mellitus with diabetic chronic kidney disease (HCC)    Primary insomnia 09/18/2022   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   DOE (dyspnea on exertion) 12/21/2013   Benzodiazepine dependence, episodic (HCC) 12/23/2011   DDD (degenerative disc disease), cervical 12/23/2011   DDD (degenerative disc disease), lumbosacral 12/23/2011   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   Fibromyalgia 02/11/2011    Past Medical History:  Diagnosis Date   Abdominal pain 02/11/2011   Abnormal CT of the chest 02/04/2014   CT chest 08/2013:  atx in RML and lingula  CT angio 12/2013:  No PE, RML scarring, abnormal density in lingula  (done at [facility])     Allergy     Anxiety    Arthritis    Asthma    Bruises easily    Chronic chest wall pain    Chronic pain    Cough    Depression    Diabetes mellitus without complication (HCC)    Dyspnea    chronic   Dysuria 09/18/2022   Encounter for therapeutic drug monitoring 02/11/2011   Fibromyalgia    Functional movement disorder    GERD (gastroesophageal reflux disease)    Headache    Hearing loss    High cholesterol    History of hiatal hernia    Hypertension    Hypothyroidism    Kidney disease    stage 3   Leg swelling    both   Nausea 02/11/2011   Nausea and vomiting 04/01/2013   Pain management    PONV (postoperative nausea and vomiting)    Sebaceous cyst of breast, right subaereolar 10/30/2010   Septic shock (HCC) 01/19/2023   SMAS (superior mesenteric artery syndrome) (HCC)    Thyroid  disease    TIA (transient ischemic attack)    Tic disorder    TMJ (dislocation of temporomandibular joint)    Transient cerebral ischemia    Type 2 diabetes mellitus with complications (HCC)    Past Surgical History:  Procedure Laterality Date   AORTA - SUPERIOR MESENTERIC AND AORTA -  RENAL ARTERY BYPASS GRAFT     APPENDECTOMY     BREAST CYST EXCISION Right    BREAST EXCISIONAL BIOPSY     CHOLECYSTECTOMY     cyst removed     FLEXIBLE BRONCHOSCOPY Bilateral 06/27/2023   Procedure: BRONCHOSCOPY, FLEXIBLE;  Surgeon: Kara Dorn NOVAK, MD;  Location: [facility] ENDOSCOPY;  Service: Pulmonary;  Laterality: Bilateral;   HERNIA REPAIR     OOPHORECTOMY     bilateral   TEE WITHOUT CARDIOVERSION N/A 06/18/2012   Procedure: TRANSESOPHAGEAL ECHOCARDIOGRAM (TEE);  Surgeon: Jerel Balding, MD;  Location: [facility] ENDOSCOPY;  Service: Cardiovascular;  Laterality:  N/A;   TOTAL ABDOMINAL HYSTERECTOMY     Family History  Problem Relation Age of Onset   Asthma Father    Breast cancer Sister    Hypertension Sister    Parkinson's disease Maternal Aunt    Pulmonary fibrosis Maternal Grandmother    Emphysema Maternal Grandmother    Heart disease Maternal Grandmother    Breast cancer Maternal Grandmother    Asthma Maternal Grandfather    Heart disease Maternal Grandfather    Cancer Paternal Grandmother        breast   Heart disease Paternal Grandmother    Breast cancer Paternal Grandmother    Asthma Paternal Grandmother    Leukemia Paternal Grandfather    Heart disease Paternal Grandfather    Clotting disorder Paternal Grandfather    Outpatient Medications Prior to Visit  Medication Sig Dispense Refill   albuterol  (VENTOLIN  HFA) 108 (90 Base) MCG/ACT inhaler TAKE 2 PUFFS BY MOUTH EVERY 6 HOURS AS NEEDED FOR WHEEZE OR SHORTNESS OF BREATH 8.5 each 2   ARIPiprazole  (ABILIFY ) 5 MG tablet Take 5 mg by mouth daily.     atorvastatin  (LIPITOR) 20 MG tablet Take 1 tablet (20 mg total) by mouth at bedtime. 90 tablet 3   diazepam  (VALIUM ) 5 MG tablet TAKE 1 TABLET (5 MG TOTAL) BY MOUTH TWICE A DAY AS NEEDED FOR ANXIETY 30 tablet 3   dicyclomine  (BENTYL ) 10 MG capsule Take 1 capsule (10 mg total) by mouth 4 (four) times daily -  before meals and at bedtime. (Patient taking differently: Take 10 mg by mouth 3 (three) times daily as needed for spasms.) 60 capsule 1   furosemide  (LASIX ) 20 MG tablet Take 1 tablet (20 mg total) by mouth daily for 5 days. 30 tablet 3   gabapentin  (NEURONTIN ) 600 MG tablet TAKE 1 TABLET BY MOUTH AT BEDTIME. 60 tablet 3   Glycopyrrolate-Formoterol (BEVESPI  AEROSPHERE) 9-4.8 MCG/ACT AERO Inhale 2 puffs into the lungs 2 (two) times daily. 10.7 g 5   ipratropium-albuterol  (DUONEB) 0.5-2.5 (3) MG/3ML SOLN Take 3 mLs by nebulization every 4 (four) hours as needed (shortness of breath, wheezing, or cough). 360 mL 2   levothyroxine   (SYNTHROID ) 88 MCG tablet TAKE 1 TABLET BY MOUTH DAILY BEFORE BREAKFAST. 90 tablet 2   metFORMIN  (GLUCOPHAGE -XR) 500 MG 24 hr tablet Take 1 tablet (500 mg total) by mouth daily with breakfast. 90 tablet 3   pantoprazole  (PROTONIX ) 40 MG tablet Take 1 tablet (40 mg total) by mouth daily. 30 tablet 0   promethazine  (PHENERGAN ) 25 MG tablet Take 1 tablet (25 mg total) by mouth every 8 (eight) hours as needed for nausea or vomiting. 30 tablet 3   propranolol  (INDERAL ) 80 MG tablet Take 1 tablet (80 mg total) by mouth daily. 90 tablet 3   sodium chloride  HYPERTONIC 3 % nebulizer solution Take by nebulization as needed for cough. Use 3 mL  Twice daily until symptoms improve 75 mL 5   traZODone  (DESYREL ) 100 MG tablet Take 100 mg by mouth at bedtime.     ARIPiprazole  (ABILIFY ) 2 MG tablet Take 1 tablet (2 mg total) by mouth daily. 30 tablet 3   amoxicillin -clavulanate (AUGMENTIN ) 875-125 MG tablet Take 1 tablet by mouth 2 (two) times daily. 28 tablet 0   fluconazole  (DIFLUCAN ) 200 MG tablet Take 1 tablet (200 mg total) by mouth daily. 1 tablet 0   mupirocin  cream (BACTROBAN ) 2 % APPLY TO AFFECTED AREA TWICE A DAY (Patient not taking: Reported on 07/08/2023) 15 g 1   nystatin cream (MYCOSTATIN) Apply 1 Application topically 2 (two) times daily. (Patient not taking: Reported on 07/08/2023)     No facility-administered medications prior to visit.   Allergies  Allergen Reactions   Latex Anaphylaxis and Swelling   Codeine Nausea And Vomiting    Other reaction(s): GI Upset (intolerance), Vomiting (intolerance)   Ibuprofen Other (See Comments)    GI upset, burning sensation   Oxycodone  Nausea And Vomiting    Pre-medication with phenergan  needed.   Tape Itching and Rash    Please use paper tape only.     ROS: A complete ROS was performed with pertinent positives/negatives noted in the HPI. The remainder of the ROS are negative.    Objective:   Today's Vitals   09/01/23 1410  BP: 118/73  Pulse: 69   SpO2: 100%  Weight: 156 lb 12.8 oz (71.1 kg)  Height: 5' 4 (1.626 m)    Physical Exam HEENT: Ears normal. Oral cavity normal. Pain in frontal and axillary sinuses. Pain in scalp. MUSCULOSKELETAL: Tenderness in lumbar spine. Tenderness in right hip. NEUROLOGICAL: Nystagmus in both eyes. Coordination issues. Awake and alert.  GENERAL: Well-appearing, in NAD. Well nourished.  SKIN: Pink, warm and dry. No rash, lesion, ulceration, or ecchymoses.  Head: Normocephalic. NECK: Trachea midline. Full ROM w/o pain or tenderness. No lymphadenopathy.  EARS: Tympanic membranes are intact, translucent without bulging and without drainage. Appropriate landmarks visualized.  EYES: Conjunctiva clear without exudates. EOMI, PERRL, no drainage present.  NOSE: Septum midline w/o deformity. Nares patent, mucosa pink and non-inflamed w/o drainage. No sinus tenderness.  THROAT: Uvula midline. Oropharynx clear. Tonsils non-inflamed without exudate. Mucous membranes pink and moist.  RESPIRATORY: Chest wall symmetrical. Respirations even and non-labored. Breath sounds clear to auscultation bilaterally.  CARDIAC: S1, S2 present, regular rate and rhythm without murmur or gallops. Peripheral pulses 2+ bilaterally.  MSK: Muscle tone and strength appropriate for age. Joints w/o tenderness, redness, or swelling.  EXTREMITIES: Without clubbing, cyanosis, or edema.  NEUROLOGIC: No motor or sensory deficits. Steady, even gait. C2-C12 intact.  PSYCH/MENTAL STATUS: Alert, oriented x 3. Cooperative, appropriate mood and affect.   Health Maintenance Due  Topic Date Due   OPHTHALMOLOGY EXAM  Never done   Pneumococcal Vaccine: 50+ Years (1 of 2 - PCV) Never done   Hepatitis B Vaccines (1 of 3 - 19+ 3-dose series) Never done   DEXA SCAN  02/08/2017   Zoster Vaccines- Shingrix (1 of 2) Never done   COVID-19 Vaccine (1 - 2024-25 season) Never done   HEMOGLOBIN A1C  07/21/2023   Diabetic kidney evaluation - Urine ACR   08/30/2023    No results found for any visits on 09/01/23.  The 10-year ASCVD risk score (Arnett DK, et al., 2019) is: 3.8%     Assessment & Plan:  *** There are no diagnoses linked to this encounter.  No orders of  the defined types were placed in this encounter.  Lab Orders  No laboratory test(s) ordered today   No images are attached to the encounter or orders placed in the encounter.  No follow-ups on file.    Patient to reach out to office if new, worrisome, or unresolved symptoms arise or if no improvement in patient's condition. Patient verbalized understanding and is agreeable to treatment plan. All questions answered to patient's satisfaction.    Thersia Schuyler Stark, [place]

## 2023-09-02 ENCOUNTER — Ambulatory Visit: Payer: Self-pay | Admitting: Pulmonary Disease

## 2023-09-02 ENCOUNTER — Ambulatory Visit (HOSPITAL_BASED_OUTPATIENT_CLINIC_OR_DEPARTMENT_OTHER): Payer: Self-pay | Admitting: Family Medicine

## 2023-09-02 ENCOUNTER — Emergency Department (HOSPITAL_BASED_OUTPATIENT_CLINIC_OR_DEPARTMENT_OTHER)

## 2023-09-02 ENCOUNTER — Ambulatory Visit (INDEPENDENT_AMBULATORY_CARE_PROVIDER_SITE_OTHER)

## 2023-09-02 ENCOUNTER — Inpatient Hospital Stay (HOSPITAL_BASED_OUTPATIENT_CLINIC_OR_DEPARTMENT_OTHER)
Admission: EM | Admit: 2023-09-02 | Discharge: 2023-09-08 | DRG: 193 | Disposition: A | Discharge status: Home / Self Care | Attending: Internal Medicine | Admitting: Internal Medicine

## 2023-09-02 ENCOUNTER — Emergency Department (HOSPITAL_BASED_OUTPATIENT_CLINIC_OR_DEPARTMENT_OTHER): Admitting: Radiology

## 2023-09-02 ENCOUNTER — Other Ambulatory Visit: Payer: Self-pay | Admitting: Acute Care

## 2023-09-02 ENCOUNTER — Other Ambulatory Visit: Payer: Self-pay

## 2023-09-02 DIAGNOSIS — N1831 Chronic kidney disease, stage 3a: Secondary | ICD-10-CM | POA: Diagnosis present

## 2023-09-02 DIAGNOSIS — R062 Wheezing: Secondary | ICD-10-CM

## 2023-09-02 DIAGNOSIS — Z90710 Acquired absence of both cervix and uterus: Secondary | ICD-10-CM

## 2023-09-02 DIAGNOSIS — H9190 Unspecified hearing loss, unspecified ear: Secondary | ICD-10-CM | POA: Diagnosis present

## 2023-09-02 DIAGNOSIS — E876 Hypokalemia: Secondary | ICD-10-CM | POA: Diagnosis present

## 2023-09-02 DIAGNOSIS — Z832 Family history of diseases of the blood and blood-forming organs and certain disorders involving the immune mechanism: Secondary | ICD-10-CM

## 2023-09-02 DIAGNOSIS — G9340 Encephalopathy, unspecified: Secondary | ICD-10-CM | POA: Diagnosis present

## 2023-09-02 DIAGNOSIS — Z7951 Long term (current) use of inhaled steroids: Secondary | ICD-10-CM

## 2023-09-02 DIAGNOSIS — Z8673 Personal history of transient ischemic attack (TIA), and cerebral infarction without residual deficits: Secondary | ICD-10-CM

## 2023-09-02 DIAGNOSIS — M25551 Pain in right hip: Secondary | ICD-10-CM

## 2023-09-02 DIAGNOSIS — J069 Acute upper respiratory infection, unspecified: Secondary | ICD-10-CM

## 2023-09-02 DIAGNOSIS — Z8614 Personal history of Methicillin resistant Staphylococcus aureus infection: Secondary | ICD-10-CM

## 2023-09-02 DIAGNOSIS — Z7984 Long term (current) use of oral hypoglycemic drugs: Secondary | ICD-10-CM

## 2023-09-02 DIAGNOSIS — M545 Low back pain, unspecified: Secondary | ICD-10-CM

## 2023-09-02 DIAGNOSIS — Z806 Family history of leukemia: Secondary | ICD-10-CM

## 2023-09-02 DIAGNOSIS — Z886 Allergy status to analgesic agent status: Secondary | ICD-10-CM

## 2023-09-02 DIAGNOSIS — Z87891 Personal history of nicotine dependence: Secondary | ICD-10-CM

## 2023-09-02 DIAGNOSIS — W19XXXA Unspecified fall, initial encounter: Secondary | ICD-10-CM | POA: Diagnosis not present

## 2023-09-02 DIAGNOSIS — H5500 Unspecified nystagmus: Secondary | ICD-10-CM | POA: Diagnosis present

## 2023-09-02 DIAGNOSIS — E1122 Type 2 diabetes mellitus with diabetic chronic kidney disease: Secondary | ICD-10-CM | POA: Diagnosis present

## 2023-09-02 DIAGNOSIS — F22 Delusional disorders: Secondary | ICD-10-CM | POA: Diagnosis present

## 2023-09-02 DIAGNOSIS — F419 Anxiety disorder, unspecified: Secondary | ICD-10-CM | POA: Diagnosis present

## 2023-09-02 DIAGNOSIS — E039 Hypothyroidism, unspecified: Secondary | ICD-10-CM | POA: Diagnosis present

## 2023-09-02 DIAGNOSIS — Z79890 Hormone replacement therapy (postmenopausal): Secondary | ICD-10-CM

## 2023-09-02 DIAGNOSIS — Z803 Family history of malignant neoplasm of breast: Secondary | ICD-10-CM

## 2023-09-02 DIAGNOSIS — Z79899 Other long term (current) drug therapy: Secondary | ICD-10-CM

## 2023-09-02 DIAGNOSIS — J45909 Unspecified asthma, uncomplicated: Secondary | ICD-10-CM | POA: Diagnosis present

## 2023-09-02 DIAGNOSIS — Z8249 Family history of ischemic heart disease and other diseases of the circulatory system: Secondary | ICD-10-CM

## 2023-09-02 DIAGNOSIS — R278 Other lack of coordination: Secondary | ICD-10-CM | POA: Insufficient documentation

## 2023-09-02 DIAGNOSIS — R053 Chronic cough: Secondary | ICD-10-CM | POA: Insufficient documentation

## 2023-09-02 DIAGNOSIS — J9601 Acute respiratory failure with hypoxia: Secondary | ICD-10-CM | POA: Diagnosis present

## 2023-09-02 DIAGNOSIS — K219 Gastro-esophageal reflux disease without esophagitis: Secondary | ICD-10-CM | POA: Diagnosis present

## 2023-09-02 DIAGNOSIS — Z91040 Latex allergy status: Secondary | ICD-10-CM

## 2023-09-02 DIAGNOSIS — E7800 Pure hypercholesterolemia, unspecified: Secondary | ICD-10-CM | POA: Diagnosis present

## 2023-09-02 DIAGNOSIS — Z1152 Encounter for screening for COVID-19: Secondary | ICD-10-CM

## 2023-09-02 DIAGNOSIS — R4182 Altered mental status, unspecified: Secondary | ICD-10-CM | POA: Diagnosis present

## 2023-09-02 DIAGNOSIS — M797 Fibromyalgia: Secondary | ICD-10-CM | POA: Diagnosis present

## 2023-09-02 DIAGNOSIS — Z820 Family history of epilepsy and other diseases of the nervous system: Secondary | ICD-10-CM

## 2023-09-02 DIAGNOSIS — I129 Hypertensive chronic kidney disease with stage 1 through stage 4 chronic kidney disease, or unspecified chronic kidney disease: Secondary | ICD-10-CM | POA: Diagnosis present

## 2023-09-02 DIAGNOSIS — Z885 Allergy status to narcotic agent status: Secondary | ICD-10-CM

## 2023-09-02 DIAGNOSIS — Z825 Family history of asthma and other chronic lower respiratory diseases: Secondary | ICD-10-CM

## 2023-09-02 DIAGNOSIS — Z91148 Patient's other noncompliance with medication regimen for other reason: Secondary | ICD-10-CM

## 2023-09-02 DIAGNOSIS — J189 Pneumonia, unspecified organism: Principal | ICD-10-CM | POA: Diagnosis present

## 2023-09-02 LAB — CBC
HCT: 34.3 % — ABNORMAL LOW (ref 36.0–46.0)
Hemoglobin: 10.4 g/dL — ABNORMAL LOW (ref 12.0–15.0)
MCH: 29.2 pg (ref 26.0–34.0)
MCHC: 30.3 g/dL (ref 30.0–36.0)
MCV: 96.3 fL (ref 80.0–100.0)
Platelets: 298 K/uL (ref 150–400)
RBC: 3.56 MIL/uL — ABNORMAL LOW (ref 3.87–5.11)
RDW: 14.2 % (ref 11.5–15.5)
WBC: 4.8 K/uL (ref 4.0–10.5)
nRBC: 0 % (ref 0.0–0.2)

## 2023-09-02 LAB — BASIC METABOLIC PANEL WITH GFR
Anion gap: 10 (ref 5–15)
BUN: 6 mg/dL (ref 6–20)
CO2: 30 mmol/L (ref 22–32)
Calcium: 9.1 mg/dL (ref 8.9–10.3)
Chloride: 104 mmol/L (ref 98–111)
Creatinine, Ser: 1.09 mg/dL — ABNORMAL HIGH (ref 0.44–1.00)
GFR, Estimated: >60 mL/min (ref >60)
Glucose, Bld: 84 mg/dL (ref 70–99)
Potassium: 3.7 mmol/L (ref 3.5–5.1)
Sodium: 144 mmol/L (ref 135–145)

## 2023-09-02 LAB — RESP PANEL BY RT-PCR (RSV, FLU A&B, COVID)  RVPGX2
Influenza A by PCR: NEGATIVE
Influenza B by PCR: NEGATIVE
Resp Syncytial Virus by PCR: NEGATIVE
SARS Coronavirus 2 by RT PCR: NEGATIVE

## 2023-09-02 LAB — TSH: TSH: 62.7 u[IU]/mL — ABNORMAL HIGH (ref 0.450–4.500)

## 2023-09-02 LAB — TROPONIN T, HIGH SENSITIVITY: Troponin T High Sensitivity: <15 ng/L (ref <19)

## 2023-09-02 LAB — LACTIC ACID, PLASMA: Lactic Acid, Venous: <0.3 mmol/L — ABNORMAL LOW (ref 0.5–1.9)

## 2023-09-02 LAB — HEMOGLOBIN A1C
Est. average glucose Bld gHb Est-mCnc: 108 mg/dL
Hgb A1c MFr Bld: 5.4 % (ref 4.8–5.6)

## 2023-09-02 MED ORDER — PREDNISONE 10 MG PO TABS: ORAL_TABLET | ORAL | 0 refills | Status: DC

## 2023-09-02 MED ORDER — METHYLPREDNISOLONE SODIUM SUCC 125 MG IJ SOLR
125.0000 mg | Freq: Once | INTRAMUSCULAR | Status: AC
  Administered 2023-09-02: 125 mg via INTRAVENOUS
  Filled 2023-09-02: qty 2

## 2023-09-02 MED ORDER — IOHEXOL 350 MG/ML SOLN
100.0000 mL | Freq: Once | INTRAVENOUS | Status: AC | PRN
  Administered 2023-09-02: 75 mL via INTRAVENOUS

## 2023-09-02 MED ORDER — VANCOMYCIN HCL IN DEXTROSE 1-5 GM/200ML-% IV SOLN
1000.0000 mg | Freq: Once | INTRAVENOUS | Status: AC
  Administered 2023-09-02: 1000 mg via INTRAVENOUS
  Filled 2023-09-02: qty 200

## 2023-09-02 MED ORDER — AMOXICILLIN-POT CLAVULANATE 875-125 MG PO TABS: 1.0000 | ORAL_TABLET | Freq: Two times a day (BID) | ORAL | 0 refills | Status: DC

## 2023-09-02 MED ORDER — SODIUM CHLORIDE 0.9 % IV SOLN
1.0000 g | Freq: Once | INTRAVENOUS | Status: AC
  Administered 2023-09-02: 1 g via INTRAVENOUS
  Filled 2023-09-02: qty 10

## 2023-09-02 MED ORDER — SODIUM CHLORIDE 0.9 % IV SOLN
500.0000 mg | Freq: Once | INTRAVENOUS | Status: AC
  Administered 2023-09-02: 500 mg via INTRAVENOUS
  Filled 2023-09-02: qty 5

## 2023-09-02 MED ORDER — IPRATROPIUM BROMIDE 0.02 % IN SOLN
0.5000 mg | Freq: Once | RESPIRATORY_TRACT | Status: AC
  Administered 2023-09-02: 0.5 mg via RESPIRATORY_TRACT
  Filled 2023-09-02: qty 2.5

## 2023-09-02 MED ORDER — ALBUTEROL SULFATE (2.5 MG/3ML) 0.083% IN NEBU
5.0000 mg | INHALATION_SOLUTION | Freq: Once | RESPIRATORY_TRACT | Status: AC
  Administered 2023-09-02: 5 mg via RESPIRATORY_TRACT
  Filled 2023-09-02: qty 6

## 2023-09-02 MED ORDER — VANCOMYCIN HCL 500 MG IV SOLR
500.0000 mg | Freq: Once | INTRAVENOUS | Status: AC
  Administered 2023-09-03: 500 mg via INTRAVENOUS
  Filled 2023-09-02: qty 10

## 2023-09-02 NOTE — Progress Notes (Signed)
 ED Pharmacy Antibiotic Sign Off An antibiotic consult was received from an ED provider for vancomycin  per pharmacy dosing for pneumonia. A chart review was completed to assess appropriateness.  A single dose of ceftriaxone  and azithromycin  placed by the ED provider.   The following one time order(s) were placed per pharmacy consult:  vancomycin  1500 mg x 1 dose  Further antibiotic and/or antibiotic pharmacy consults should be ordered by the admitting provider if indicated.   Thank you for allowing pharmacy to be a part of this patient's care.   Dorn Buttner, PharmD, BCPS 09/02/2023 7:47 PM ED Clinical Pharmacist -  [phone]

## 2023-09-02 NOTE — ED Notes (Signed)
Blood work obtained in triage.

## 2023-09-02 NOTE — Progress Notes (Signed)
 See Patient call note 09/02/2023

## 2023-09-02 NOTE — Telephone Encounter (Signed)
 FYI Only or Action Required?: FYI only for provider.  Patient is followed in Pulmonology for recurrent pneumonia, last seen on 07/14/2023 by Kara Dorn NOVAK, MD.  Called Nurse Triage reporting Shortness of Breath, thick brown and green sputum, Fever, Fall, Chest Pain, Fatigue, and Cough.  Symptoms began a week ago.  Interventions attempted: OTC medications: OTC antipyretics, Increased fluids/rest, and Other: PCP appt yesterday.  Symptoms are: rapidly worsening.  Triage Disposition: Call EMS 911 Now  Patient/caregiver understands and will follow disposition?: No, refuses disposition      Copied from CRM [id number]. Topic: Clinical - Red Word Triage >> Sep 02, 2023  8:17 AM Isabell A wrote: Red Word that prompted transfer to Nurse Triage: Experiencing mucus, SOB, when she eats food is going in two directions Feels like it's going into lungs also Reason for Disposition  Sounds like a life-threatening emergency to the triager  Answer Assessment - Initial Assessment Questions E2C2 Pulmonary Triage - Initial Assessment Questions Chief Complaint (e.g., cough, sob, wheezing, fever, chills, sweat or additional symptoms) *Go to specific symptom protocol after initial questions. More SOB than usual Know it's worse when I walk and talk Lot thick brown and green mucus Noticed I've had to take big breath in [place] to PCP yesterday, said needed to let Dr. Kara know Fever, yesterday 69 F, taking OTC meds does bring down temp Struggling to talk - speaking in phrases to single words Just feel bad Sometimes chest pain, not going to say have it continuously just have it sometimes, to be honest sometimes maybe 5 min or less but can feel it through my back, feel something in my neck but also had a fall, what saw PCP for, don't know what happened, some kind of episode Lot of fatigue Eyes are crossing, haven't really been able to open them Lot of just wanting to sleep Nurse asked if too weak to  stand, pt responds I stand anyway Cough, dry cough, hurts my head to cough Struggling to breathe when moving around Coughing scares my husband, coughing then all of a sudden *mimics stridor*  How long have symptoms been present? Noticed it in a week  Have you used your inhalers/maintenance medication? Yes If yes, What medications? Trelegy been using it Only get when on disability, takes every bill I got Use albuterol  inhaler and nebulizer then stopped it because was better then comes back  If inhaler, ask How many puffs and how often? Note: Review instructions on medication in the chart. Last used week before last  OXYGEN: Do you wear supplemental oxygen? No  Do you monitor your oxygen levels? No If yes, What is your reading (oxygen level) today? Have not done it walking, would love to see what it gets to  6. CARDIAC HISTORY: Do you have any history of heart disease? (e.g., heart attack, angina, bypass surgery, angioplasty)      HTN  7. LUNG HISTORY: Do you have any history of lung disease?  (e.g., pulmonary embolus, asthma, emphysema)     Significant    Advised pt use albuterol  inhaler/nebulizer right away, go to hospital asap, advised call 911 if struggling to breathe, feeling like about to pass out, or really feeling severe. Pt choosing to have family drive her to hospital.  Protocols used: Breathing Difficulty-A-AH

## 2023-09-02 NOTE — ED Notes (Signed)
 Pt ambulated... Pt became more SOB, SpO2 decreased to 86% and the HR increased to 80... Provider and RRT informed.SABRASABRA

## 2023-09-02 NOTE — Telephone Encounter (Signed)
ATC x1 LVM for patient to call our office back. 

## 2023-09-02 NOTE — ED Provider Notes (Addendum)
 [place] EMERGENCY DEPARTMENT AT [facility] Provider Note   CSN: [id number] Arrival date & time: 09/02/23  1505     Patient presents with: Shortness of Breath and Chest Pain   Sabrina Roberson is a 53 y.o. female.   Patient with h/o mild asthma, diabetes mellitus type 2, hypertension, dyslipidemia, hypothyroidism, GERD, tobacco smoking, admission 01/19/23-02/15/23 for respiratory failure requiring intubation, influenza A pneumonia with ARDS along with septic shock, subsequently, tracheal aspirate was positive for MRSA.  Patient states that she never fully returned to normal with recurrent chest congestion.  Pulmonology evaluation has included swallowing evaluation due to concerns for recurrent aspiration.  She states that she was treated with several courses of antibiotics since that time.  Her breathing has worsened over the past several days with increasing sputum production and chest congestion.  She reports fever up to 101F 2 days ago and again yesterday.  She spoke with her pulmonologist today, was referred to the emergency department.  They did send in a prescription for prednisone  taper and Augmentin .  Patient reports oxygen saturation down to 87% at home.  No worse than baseline lower extremity swelling.  She does have some wheezing at times.  Did try albuterol  nebulizer earlier today.  She stopped using tobacco during previous admission.       Prior to Admission medications   Medication Sig Start Date End Date Taking? Authorizing Provider  albuterol  (VENTOLIN  HFA) 108 (90 Base) MCG/ACT inhaler TAKE 2 PUFFS BY MOUTH EVERY 6 HOURS AS NEEDED FOR WHEEZE OR SHORTNESS OF BREATH 08/14/23   Caudle, Thersia Bitters, FNP  amoxicillin -clavulanate (AUGMENTIN ) 875-125 MG tablet Take 1 tablet by mouth 2 (two) times daily. 09/02/23   Ruthell Lauraine FALCON, NP  ARIPiprazole  (ABILIFY ) 5 MG tablet Take 5 mg by mouth daily. 08/27/23   [provider]  atorvastatin  (LIPITOR) 20 MG tablet Take 1  tablet (20 mg total) by mouth at bedtime. 11/04/22   Caudle, Thersia Bitters, FNP  diazepam  (VALIUM ) 5 MG tablet TAKE 1 TABLET (5 MG TOTAL) BY MOUTH TWICE A DAY AS NEEDED FOR ANXIETY 06/18/23   Caudle, Thersia Bitters, FNP  dicyclomine  (BENTYL ) 10 MG capsule Take 1 capsule (10 mg total) by mouth 4 (four) times daily -  before meals and at bedtime. Patient taking differently: Take 10 mg by mouth 3 (three) times daily as needed for spasms. 10/10/22   Caudle, Thersia Bitters, FNP  furosemide  (LASIX ) 20 MG tablet Take 1 tablet (20 mg total) by mouth daily for 5 days. 05/29/23 09/01/23  Knute Thersia Bitters, FNP  gabapentin  (NEURONTIN ) 600 MG tablet TAKE 1 TABLET BY MOUTH AT BEDTIME. 06/26/23   Caudle, Thersia Bitters, FNP  Glycopyrrolate-Formoterol (BEVESPI  AEROSPHERE) 9-4.8 MCG/ACT AERO Inhale 2 puffs into the lungs 2 (two) times daily. 07/14/23   Kara Dorn NOVAK, MD  ipratropium-albuterol  (DUONEB) 0.5-2.5 (3) MG/3ML SOLN Take 3 mLs by nebulization every 4 (four) hours as needed (shortness of breath, wheezing, or cough). 03/04/23   Caudle, Thersia Bitters, FNP  levothyroxine  (SYNTHROID ) 88 MCG tablet TAKE 1 TABLET BY MOUTH DAILY BEFORE BREAKFAST. 07/13/23   Caudle, Thersia Bitters, FNP  metFORMIN  (GLUCOPHAGE -XR) 500 MG 24 hr tablet Take 1 tablet (500 mg total) by mouth daily with breakfast. 11/04/22   Caudle, Thersia Bitters, FNP  mupirocin  ointment (BACTROBAN ) 2 % Apply to affected area TID for 7 days. 09/01/23   Caudle, Thersia Bitters, FNP  pantoprazole  (PROTONIX ) 40 MG tablet Take 1 tablet (40 mg total) by mouth daily. 02/06/23 09/01/23  Leotis Bogus, MD  predniSONE  (DELTASONE ) 10 MG tablet Prednisone  taper; 10 mg tablets: 4 tabs x 2 days, 3 tabs x 2 days, 2 tabs x 2 days 1 tab x 2 days then stop. 09/02/23   Ruthell Lauraine FALCON, NP  promethazine  (PHENERGAN ) 25 MG tablet Take 1 tablet (25 mg total) by mouth every 8 (eight) hours as needed for nausea or vomiting. 04/03/23   Caudle, Thersia Bitters, FNP  propranolol  (INDERAL ) 80 MG tablet  Take 1 tablet (80 mg total) by mouth daily. 10/10/22   Caudle, Thersia Bitters, FNP  sodium chloride  HYPERTONIC 3 % nebulizer solution Take by nebulization as needed for cough. Use 3 mL Twice daily until symptoms improve 03/20/23   Cobb, Comer GAILS, NP  traZODone  (DESYREL ) 100 MG tablet Take 100 mg by mouth at bedtime. 08/23/23   [provider]    Allergies: Latex, Codeine, Ibuprofen, Oxycodone , and Tape    Review of Systems  Updated Vital Signs BP 123/77 (BP Location: Right Arm)   Pulse (!) 57   Temp 98.9 F (37.2 C)   Resp 16   Ht 5' 4 (1.626 m)   Wt 70.8 kg   SpO2 94%   BMI 26.78 kg/m   Physical Exam Vitals and nursing note reviewed.  Constitutional:      General: She is not in acute distress.    Appearance: She is well-developed.  HENT:     Head: Normocephalic and atraumatic.     Right Ear: External ear normal.     Left Ear: External ear normal.     Nose: Nose normal.  Eyes:     Conjunctiva/sclera: Conjunctivae normal.  Cardiovascular:     Rate and Rhythm: Normal rate and regular rhythm.     Heart sounds: No murmur heard. Pulmonary:     Effort: No respiratory distress.     Breath sounds: Wheezing (Scattered end expiratory wheezing all fields) present. No rhonchi or rales.  Abdominal:     Palpations: Abdomen is soft.     Tenderness: There is no abdominal tenderness. There is no guarding or rebound.  Musculoskeletal:     Cervical back: Normal range of motion and neck supple.     Right lower leg: Edema present.     Left lower leg: Edema present.     Comments: Trace bilateral lower extremity edema  Skin:    General: Skin is warm and dry.     Findings: No rash.  Neurological:     General: No focal deficit present.     Mental Status: She is alert. Mental status is at baseline.     Motor: No weakness.  Psychiatric:        Mood and Affect: Mood normal.     (all labs ordered are listed, but only abnormal results are displayed) Labs Reviewed  BASIC  METABOLIC PANEL WITH GFR - Abnormal; Notable for the following components:      Result Value   Creatinine, Ser 1.09 (*)    All other components within normal limits  CBC - Abnormal; Notable for the following components:   RBC 3.56 (*)    Hemoglobin 10.4 (*)    HCT 34.3 (*)    All other components within normal limits  LACTIC ACID, PLASMA - Abnormal; Notable for the following components:   Lactic Acid, Venous <0.3 (*)    All other components within normal limits  RESP PANEL BY RT-PCR (RSV, FLU A&B, COVID)  RVPGX2  CULTURE, BLOOD (ROUTINE X 2)  CULTURE,  BLOOD (ROUTINE X 2)  TROPONIN T, HIGH SENSITIVITY    ED ECG REPORT   Date: 09/02/2023  Rate: 54  Rhythm: sinus bradycardia  QRS Axis: normal  Intervals: normal  ST/T Wave abnormalities: nonspecific T wave changes  Conduction Disutrbances:none  Narrative Interpretation: Poor baseline complicates interpretation  Old EKG Reviewed: changes noted, slower  I have personally reviewed the EKG tracing and agree with the computerized printout as noted.   Radiology: DG Chest 2 View Result Date: 09/02/2023 CLINICAL DATA:  Chest pain. EXAM: DG CHEST 2V COMPARISON:  Jun 27, 2023 FINDINGS: The heart size and mediastinal contours are within normal limits. Low lung volumes are noted with mild, diffuse, chronic appearing increased interstitial lung markings. Mild left upper lobe linear scarring and/or atelectasis is seen within the left upper lobe. No pleural effusion or pneumothorax is identified. Radiopaque surgical clips are seen within the right upper quadrant. The visualized skeletal structures are unremarkable. IMPRESSION: Low lung volumes with mild left upper lobe linear scarring and/or atelectasis. Electronically Signed   By: Suzen Dials M.D.   On: 09/02/2023 16:12   DG Hip Unilat W OR W/O Pelvis 1V Right Result Date: 09/02/2023 CLINICAL DATA:  Fall, right hip tenderness EXAM: DG HIP (WITH OR WITHOUT PELVIS) 1V RIGHT COMPARISON:  None  Available. FINDINGS: Normal alignment. No acute fracture or dislocation. Joint spaces are preserved. Soft tissues are unremarkable IMPRESSION: 1. Negative. Electronically Signed   By: Dorethia Molt M.D.   On: 09/02/2023 15:38   DG Lumbar Spine Complete Result Date: 09/02/2023 CLINICAL DATA:  Back pain. EXAM: LUMBAR SPINE - COMPLETE 4+ VIEW COMPARISON:  Abdominal radiograph dated 01/29/2023. FINDINGS: Five lumbar type vertebra. There is no acute fracture or subluxation of the lumbar spine. There is L5 pars defects with 12 mm anterolisthesis of L5 over S1. There is degenerative changes at L5-S1 with spurring and osteophyte. The bones are osteopenic. Atherosclerotic calcification of the abdominal aorta. Right upper quadrant cholecystectomy clips. IMPRESSION: 1. No acute fracture or subluxation of the lumbar spine. 2. L5 pars defects with 12 mm anterolisthesis of L5 over S1. Electronically Signed   By: Vanetta Chou M.D.   On: 09/02/2023 15:36   CT HEAD WO CONTRAST ( ) Result Date: 09/01/2023 CLINICAL DATA:  Mental status change, unknown cause Impaired gait, memory, + nystagmus and symptoms of paranoia ongoing for 2-3 months with recent mechanical fall 1 week prior ; concerns for possible psychiatric versus neurological disease state. Failed Romberg testing. EXAM: CT HEAD WITHOUT CONTRAST TECHNIQUE: Contiguous axial images were obtained from the base of the skull through the vertex without intravenous contrast. RADIATION DOSE REDUCTION: This exam was performed according to the departmental dose-optimization program which includes automated exposure control, adjustment of the mA and/or kV according to patient size and/or use of iterative reconstruction technique. COMPARISON:  August 14, 2016 FINDINGS: Brain: The ventricles appear age appropriate. No mass effect or midline shift. Gray-white differentiation is preserved without focal attenuation abnormality.No evidence of acute territorial infarction, extra-axial  fluid collection, hemorrhage, or mass lesion. The basilar cisterns are patent without downward herniation. The cerebellar hemispheres and vermis are well formed without mass lesion or focal attenuation abnormality. Vascular: No hyperdense vessel. Skull: Normal. Negative for fracture or focal lesion. Sinuses/Orbits: The paranasal sinuses and mastoids are clear.The globes appear intact. No retrobulbar hematoma. Other: None. IMPRESSION: No acute intracranial abnormality, specifically, no acute hemorrhage, territorial infarction, or intracranial mass. Electronically Signed   By: Rogelia Myers M.D.   On: 09/01/2023 16:49  Procedures   Medications Ordered in the ED  azithromycin  (ZITHROMAX ) 500 mg in sodium chloride  0.9 % 250 mL IVPB (500 mg Intravenous New Bag/Given 09/02/23 1934)  albuterol  (PROVENTIL ) (2.5 MG/3ML) 0.083% nebulizer solution 5 mg (5 mg Nebulization Given 09/02/23 1622)  ipratropium (ATROVENT ) nebulizer solution 0.5 mg (0.5 mg Nebulization Given 09/02/23 1622)  methylPREDNISolone  sodium succinate  (SOLU-MEDROL ) 125 mg/2 mL injection 125 mg (125 mg Intravenous Given 09/02/23 1606)  iohexol  (OMNIPAQUE ) 350 MG/ML injection 100 mL (75 mLs Intravenous Contrast Given 09/02/23 1728)  cefTRIAXone  (ROCEPHIN ) 1 g in sodium chloride  0.9 % 100 mL IVPB (0 g Intravenous Stopped 09/02/23 1929)   ED Course  Patient seen and examined. History obtained directly from patient and family member at bedside.  I reviewed previous hospitalization discharge summary and recent outpatient notes.  Labs/EKG: Ordered CBC, BMP, troponin, added lactate, added COVID testing.  Imaging: Ordered chest x-ray.  Medications/Fluids: Ordered: Albuterol /Atrovent .   Most recent vital signs reviewed and are as follows: BP 123/77 (BP Location: Right Arm)   Pulse (!) 57   Temp 98.9 F (37.2 C)   Resp 16   Ht 5' 4 (1.626 m)   Wt 70.8 kg   SpO2 94%   BMI 26.78 kg/m   Initial impression: Shortness of breath with increased  sputum production, wheezing, reported fevers recently, reported no oxygen saturation at home.  Will treat with albuterol /Atrovent .  After will have patient ambulate with pulse ox to obtain a reading here.  6:40 PM Reassessment performed. Patient appears stable on several rechecks..  O2 sat 89-91% at rest.  She ambulated and dipped down into the 86/87% range and was short of breath.  CT scan was ordered  Labs personally reviewed and interpreted including: CBC with normal white blood cell count, hemoglobin 10.4; BMP creatinine 1.09 otherwise unremarkable; lactate less than 0.3; viral panel negative; troponin less than 15.  Imaging personally visualized and interpreted including: Chest x-ray, atelectasis, no definite pneumonia.  Given concern for occult pneumonia, CT angio of the chest was ordered.  This does not demonstrate PE, does show left lower lobe pneumonia.  Reviewed pertinent lab work and imaging with patient at bedside. Questions answered.   Most current vital signs reviewed and are as follows: BP 110/75 (BP Location: Right Arm)   Pulse (!) 59   Temp 98.9 F (37.2 C)   Resp 15   Ht 5' 4 (1.626 m)   Wt 70.8 kg   SpO2 93%   BMI 26.78 kg/m   Plan: Admit to hospital.   I have ordered Rocephin  and azithromycin .  Patient agrees to admission.  7:38 PM Discussed case with Dr. Franky who agrees with admission. Given h/o MRSA PNA, would like treatment with vancomycin . Ordered.   CRITICAL CARE Performed by: Fonda Ruby PA-C Total critical care time: 45 minutes Critical care time was exclusive of separately billable procedures and treating other patients. Critical care was necessary to treat or prevent imminent or life-threatening deterioration. Critical care was time spent personally by me on the following activities: development of treatment plan with patient and/or surrogate as well as nursing, discussions with consultants, evaluation of patient's response to treatment,  examination of patient, obtaining history from patient or surrogate, ordering and performing treatments and interventions, ordering and review of laboratory studies, ordering and review of radiographic studies, pulse oximetry and re-evaluation of patient's condition.  Medical Decision Making Amount and/or Complexity of Data Reviewed Labs: ordered. Radiology: ordered.  Risk Prescription drug management. Decision regarding hospitalization.   Patient with hypoxic respiratory failure in setting of left lower lobe pneumonia.  She will be admitted for IV antibiotics.  History of MRSA pneumonia earlier in the year during hospitalization.  Also with wheezing, treated with albuterol .  No evidence of PE on CT scan.     Final diagnoses:  Community acquired pneumonia of left lower lobe of lung  Acute hypoxic respiratory failure [facility])  Wheezing    ED Discharge Orders     None         Desiderio Chew, PA-C 09/02/23 1940    Mannie Pac T, DO 09/02/23 2356

## 2023-09-02 NOTE — Progress Notes (Signed)
 Hi Sabrina Roberson, Your CT head scan is negative for any type of bleed, infection, or mass.  This is reassuring that your symptoms are likely stemming from a more psychological cause.  I would recommend calling your psychiatrist and getting in with them earlier than later this month.  If there are any signs or symptoms or worsening, please go to the [facility] behavioral health urgent center or the emergency department if needed.

## 2023-09-02 NOTE — Telephone Encounter (Signed)
 ATC X1. LMTCB

## 2023-09-02 NOTE — ED Triage Notes (Signed)
 Pt POV reporting chest pain, congestion, and SOB x1 week. Recurrent issues with pneumonia r/t hospitalization in Jan, was intubated. Called pulmonologist and was advised to come to Ed for chest xray due to no appts available.

## 2023-09-03 ENCOUNTER — Observation Stay (HOSPITAL_COMMUNITY)

## 2023-09-03 ENCOUNTER — Telehealth: Payer: Self-pay

## 2023-09-03 DIAGNOSIS — Z8249 Family history of ischemic heart disease and other diseases of the circulatory system: Secondary | ICD-10-CM | POA: Diagnosis not present

## 2023-09-03 DIAGNOSIS — N1831 Chronic kidney disease, stage 3a: Secondary | ICD-10-CM | POA: Diagnosis present

## 2023-09-03 DIAGNOSIS — Z806 Family history of leukemia: Secondary | ICD-10-CM | POA: Diagnosis not present

## 2023-09-03 DIAGNOSIS — Z820 Family history of epilepsy and other diseases of the nervous system: Secondary | ICD-10-CM | POA: Diagnosis not present

## 2023-09-03 DIAGNOSIS — E876 Hypokalemia: Secondary | ICD-10-CM | POA: Diagnosis present

## 2023-09-03 DIAGNOSIS — Z87891 Personal history of nicotine dependence: Secondary | ICD-10-CM | POA: Diagnosis not present

## 2023-09-03 DIAGNOSIS — J9621 Acute and chronic respiratory failure with hypoxia: Secondary | ICD-10-CM | POA: Diagnosis not present

## 2023-09-03 DIAGNOSIS — Z803 Family history of malignant neoplasm of breast: Secondary | ICD-10-CM | POA: Diagnosis not present

## 2023-09-03 DIAGNOSIS — J9601 Acute respiratory failure with hypoxia: Secondary | ICD-10-CM | POA: Diagnosis present

## 2023-09-03 DIAGNOSIS — Z825 Family history of asthma and other chronic lower respiratory diseases: Secondary | ICD-10-CM | POA: Diagnosis not present

## 2023-09-03 DIAGNOSIS — R4182 Altered mental status, unspecified: Secondary | ICD-10-CM

## 2023-09-03 DIAGNOSIS — G9340 Encephalopathy, unspecified: Secondary | ICD-10-CM | POA: Diagnosis present

## 2023-09-03 DIAGNOSIS — J189 Pneumonia, unspecified organism: Secondary | ICD-10-CM | POA: Diagnosis present

## 2023-09-03 DIAGNOSIS — M797 Fibromyalgia: Secondary | ICD-10-CM | POA: Diagnosis present

## 2023-09-03 DIAGNOSIS — I129 Hypertensive chronic kidney disease with stage 1 through stage 4 chronic kidney disease, or unspecified chronic kidney disease: Secondary | ICD-10-CM | POA: Diagnosis present

## 2023-09-03 DIAGNOSIS — F22 Delusional disorders: Secondary | ICD-10-CM | POA: Diagnosis present

## 2023-09-03 DIAGNOSIS — R2681 Unsteadiness on feet: Secondary | ICD-10-CM | POA: Diagnosis not present

## 2023-09-03 DIAGNOSIS — E7800 Pure hypercholesterolemia, unspecified: Secondary | ICD-10-CM | POA: Diagnosis present

## 2023-09-03 DIAGNOSIS — E1122 Type 2 diabetes mellitus with diabetic chronic kidney disease: Secondary | ICD-10-CM | POA: Diagnosis present

## 2023-09-03 DIAGNOSIS — Z1152 Encounter for screening for COVID-19: Secondary | ICD-10-CM | POA: Diagnosis not present

## 2023-09-03 DIAGNOSIS — R062 Wheezing: Secondary | ICD-10-CM | POA: Diagnosis present

## 2023-09-03 DIAGNOSIS — F419 Anxiety disorder, unspecified: Secondary | ICD-10-CM | POA: Diagnosis present

## 2023-09-03 DIAGNOSIS — Z7984 Long term (current) use of oral hypoglycemic drugs: Secondary | ICD-10-CM | POA: Diagnosis not present

## 2023-09-03 DIAGNOSIS — J441 Chronic obstructive pulmonary disease with (acute) exacerbation: Secondary | ICD-10-CM | POA: Diagnosis not present

## 2023-09-03 DIAGNOSIS — J45909 Unspecified asthma, uncomplicated: Secondary | ICD-10-CM | POA: Diagnosis present

## 2023-09-03 DIAGNOSIS — E039 Hypothyroidism, unspecified: Secondary | ICD-10-CM | POA: Diagnosis present

## 2023-09-03 DIAGNOSIS — Z79890 Hormone replacement therapy (postmenopausal): Secondary | ICD-10-CM | POA: Diagnosis not present

## 2023-09-03 DIAGNOSIS — Z90710 Acquired absence of both cervix and uterus: Secondary | ICD-10-CM | POA: Diagnosis not present

## 2023-09-03 DIAGNOSIS — Z832 Family history of diseases of the blood and blood-forming organs and certain disorders involving the immune mechanism: Secondary | ICD-10-CM | POA: Diagnosis not present

## 2023-09-03 HISTORY — DX: Altered mental status, unspecified: R41.82

## 2023-09-03 LAB — BLOOD GAS, VENOUS
Acid-Base Excess: 4.5 mmol/L — ABNORMAL HIGH (ref 0.0–2.0)
Bicarbonate: 32.5 mmol/L — ABNORMAL HIGH (ref 20.0–28.0)
O2 Saturation: 90.8 %
Patient temperature: 36.4
pCO2, Ven: 61 mmHg — ABNORMAL HIGH (ref 44–60)
pH, Ven: 7.33 (ref 7.25–7.43)
pO2, Ven: 61 mmHg — ABNORMAL HIGH (ref 32–45)

## 2023-09-03 LAB — RESPIRATORY PANEL BY PCR

## 2023-09-03 LAB — BASIC METABOLIC PANEL WITH GFR
Anion gap: 10 (ref 5–15)
BUN: 10 mg/dL (ref 6–20)
CO2: 28 mmol/L (ref 22–32)
Calcium: 7.9 mg/dL — ABNORMAL LOW (ref 8.9–10.3)
Chloride: 99 mmol/L (ref 98–111)
Creatinine, Ser: 0.9 mg/dL (ref 0.44–1.00)
GFR, Estimated: >60 mL/min (ref >60)
Glucose, Bld: 145 mg/dL — ABNORMAL HIGH (ref 70–99)
Potassium: 3.7 mmol/L (ref 3.5–5.1)
Sodium: 137 mmol/L (ref 135–145)

## 2023-09-03 LAB — RAPID URINE DRUG SCREEN, HOSP PERFORMED
Amphetamines: NOT DETECTED
Barbiturates: NOT DETECTED
Benzodiazepines: POSITIVE — AB
Cocaine: NOT DETECTED
Opiates: NOT DETECTED
Tetrahydrocannabinol: NOT DETECTED

## 2023-09-03 LAB — CBC
HCT: 34.8 % — ABNORMAL LOW (ref 36.0–46.0)
Hemoglobin: 10.2 g/dL — ABNORMAL LOW (ref 12.0–15.0)
MCH: 28.9 pg (ref 26.0–34.0)
MCHC: 29.3 g/dL — ABNORMAL LOW (ref 30.0–36.0)
MCV: 98.6 fL (ref 80.0–100.0)
Platelets: 284 K/uL (ref 150–400)
RBC: 3.53 MIL/uL — ABNORMAL LOW (ref 3.87–5.11)
RDW: 14.2 % (ref 11.5–15.5)
WBC: 5 K/uL (ref 4.0–10.5)
nRBC: 0 % (ref 0.0–0.2)

## 2023-09-03 LAB — MAGNESIUM: Magnesium: 2.1 mg/dL (ref 1.7–2.4)

## 2023-09-03 LAB — EXPECTORATED SPUTUM ASSESSMENT W GRAM STAIN, RFLX TO RESP C

## 2023-09-03 LAB — VITAMIN B12: Vitamin B-12: 303 pg/mL (ref 180–914)

## 2023-09-03 LAB — RPR: RPR Ser Ql: NONREACTIVE

## 2023-09-03 LAB — PHOSPHORUS: Phosphorus: 2.9 mg/dL (ref 2.5–4.6)

## 2023-09-03 LAB — HIV ANTIBODY (ROUTINE TESTING W REFLEX): HIV Screen 4th Generation wRfx: NONREACTIVE

## 2023-09-03 LAB — MRSA NEXT GEN BY PCR, NASAL: MRSA by PCR Next Gen: DETECTED — AB

## 2023-09-03 LAB — T4, FREE: Free T4: 0.37 ng/dL — ABNORMAL LOW (ref 0.61–1.12)

## 2023-09-03 MED ORDER — THIAMINE HCL 100 MG/ML IJ SOLN
250.0000 mg | INTRAVENOUS | Status: DC
  Administered 2023-09-04 – 2023-09-07 (×4): 250 mg via INTRAVENOUS
  Filled 2023-09-03 (×5): qty 2.5

## 2023-09-03 MED ORDER — SODIUM CHLORIDE 0.9 % IV SOLN
1.0000 g | INTRAVENOUS | Status: AC
  Administered 2023-09-04 – 2023-09-07 (×4): 1 g via INTRAVENOUS
  Filled 2023-09-03 (×5): qty 10

## 2023-09-03 MED ORDER — GABAPENTIN 300 MG PO CAPS
600.0000 mg | ORAL_CAPSULE | Freq: Every day | ORAL | Status: DC
  Administered 2023-09-03 – 2023-09-07 (×6): 600 mg via ORAL
  Filled 2023-09-03 (×6): qty 2

## 2023-09-03 MED ORDER — LEVOTHYROXINE SODIUM 88 MCG PO TABS: 88.0000 ug | ORAL_TABLET | Freq: Every day | ORAL | Status: DC

## 2023-09-03 MED ORDER — IPRATROPIUM-ALBUTEROL 0.5-2.5 (3) MG/3ML IN SOLN: 3.0000 mL | Freq: Four times a day (QID) | RESPIRATORY_TRACT | Status: DC | PRN

## 2023-09-03 MED ORDER — TRAZODONE HCL 100 MG PO TABS
100.0000 mg | ORAL_TABLET | Freq: Every day | ORAL | Status: DC
  Administered 2023-09-03 – 2023-09-07 (×6): 100 mg via ORAL
  Filled 2023-09-03 (×6): qty 1

## 2023-09-03 MED ORDER — UMECLIDINIUM BROMIDE 62.5 MCG/ACT IN AEPB
1.0000 | INHALATION_SPRAY | Freq: Every day | RESPIRATORY_TRACT | Status: DC
  Administered 2023-09-03 – 2023-09-08 (×7): 1 via RESPIRATORY_TRACT
  Filled 2023-09-03: qty 7

## 2023-09-03 MED ORDER — SODIUM CHLORIDE 0.9% FLUSH
3.0000 mL | Freq: Two times a day (BID) | INTRAVENOUS | Status: DC
  Administered 2023-09-03 – 2023-09-08 (×13): 3 mL via INTRAVENOUS

## 2023-09-03 MED ORDER — PROPRANOLOL HCL 20 MG PO TABS
80.0000 mg | ORAL_TABLET | Freq: Every day | ORAL | Status: DC
  Filled 2023-09-03: qty 4

## 2023-09-03 MED ORDER — IPRATROPIUM-ALBUTEROL 0.5-2.5 (3) MG/3ML IN SOLN
3.0000 mL | Freq: Four times a day (QID) | RESPIRATORY_TRACT | Status: DC
  Administered 2023-09-03 – 2023-09-04 (×6): 3 mL via RESPIRATORY_TRACT
  Filled 2023-09-03 (×6): qty 3

## 2023-09-03 MED ORDER — ATORVASTATIN CALCIUM 10 MG PO TABS
20.0000 mg | ORAL_TABLET | Freq: Every day | ORAL | Status: DC
  Administered 2023-09-03 – 2023-09-08 (×7): 20 mg via ORAL
  Filled 2023-09-03 (×6): qty 2

## 2023-09-03 MED ORDER — ENOXAPARIN SODIUM 40 MG/0.4ML IJ SOSY
40.0000 mg | PREFILLED_SYRINGE | INTRAMUSCULAR | Status: DC
  Administered 2023-09-03 – 2023-09-08 (×7): 40 mg via SUBCUTANEOUS
  Filled 2023-09-03 (×6): qty 0.4

## 2023-09-03 MED ORDER — ACETAMINOPHEN 500 MG PO TABS
1000.0000 mg | ORAL_TABLET | Freq: Four times a day (QID) | ORAL | Status: DC | PRN
  Administered 2023-09-06 – 2023-09-08 (×3): 1000 mg via ORAL
  Filled 2023-09-03 (×2): qty 2

## 2023-09-03 MED ORDER — MELATONIN 3 MG PO TABS
6.0000 mg | ORAL_TABLET | Freq: Every evening | ORAL | Status: DC | PRN
  Administered 2023-09-06 – 2023-09-07 (×2): 6 mg via ORAL
  Filled 2023-09-03 (×2): qty 2

## 2023-09-03 MED ORDER — VANCOMYCIN HCL 1250 MG/250ML IV SOLN
1250.0000 mg | INTRAVENOUS | Status: DC
  Administered 2023-09-03: 1250 mg via INTRAVENOUS
  Filled 2023-09-03: qty 250

## 2023-09-03 MED ORDER — DIAZEPAM 5 MG PO TABS: 5.0000 mg | ORAL_TABLET | Freq: Two times a day (BID) | ORAL | Status: DC | PRN

## 2023-09-03 MED ORDER — LEVOTHYROXINE SODIUM 100 MCG PO TABS
100.0000 ug | ORAL_TABLET | Freq: Every day | ORAL | Status: DC
  Administered 2023-09-03 – 2023-09-08 (×7): 100 ug via ORAL
  Filled 2023-09-03 (×6): qty 1

## 2023-09-03 MED ORDER — AZITHROMYCIN 250 MG PO TABS
500.0000 mg | ORAL_TABLET | Freq: Every day | ORAL | Status: AC
  Administered 2023-09-04 – 2023-09-05 (×2): 500 mg via ORAL
  Filled 2023-09-03 (×2): qty 2

## 2023-09-03 MED ORDER — ARIPIPRAZOLE 5 MG PO TABS
5.0000 mg | ORAL_TABLET | Freq: Every day | ORAL | Status: DC
  Administered 2023-09-03 – 2023-09-08 (×7): 5 mg via ORAL
  Filled 2023-09-03 (×6): qty 1

## 2023-09-03 MED ORDER — ARFORMOTEROL TARTRATE 15 MCG/2ML IN NEBU
15.0000 ug | INHALATION_SOLUTION | Freq: Two times a day (BID) | RESPIRATORY_TRACT | Status: DC
  Administered 2023-09-03 – 2023-09-08 (×13): 15 ug via RESPIRATORY_TRACT
  Filled 2023-09-03 (×12): qty 2

## 2023-09-03 MED ORDER — GADOBUTROL 1 MMOL/ML IV SOLN
7.0000 mL | Freq: Once | INTRAVENOUS | Status: AC | PRN
  Administered 2023-09-03: 7 mL via INTRAVENOUS

## 2023-09-03 MED ORDER — ONDANSETRON HCL 4 MG/2ML IJ SOLN: 4.0000 mg | Freq: Four times a day (QID) | INTRAMUSCULAR | Status: DC | PRN

## 2023-09-03 MED ORDER — THIAMINE HCL 100 MG/ML IJ SOLN
500.0000 mg | Freq: Three times a day (TID) | INTRAVENOUS | Status: AC
  Administered 2023-09-03 – 2023-09-04 (×6): 500 mg via INTRAVENOUS
  Filled 2023-09-03 (×7): qty 5

## 2023-09-03 MED ORDER — POLYETHYLENE GLYCOL 3350 17 G PO PACK: 17.0000 g | PACK | Freq: Every day | ORAL | Status: DC | PRN

## 2023-09-03 NOTE — Telephone Encounter (Signed)
 Copied from CRM [id number]. Topic: Clinical - Medical Advice >> Sep 02, 2023  2:32 PM Rozanna G wrote: Reason for CRM: PT CALLING BACK AND STATED SHE IS AT [facility] INTO THE ER RIGHT NOW.   Noted.   NFN-

## 2023-09-03 NOTE — Progress Notes (Signed)
 Please call patient and let her know her A1c has improved significantly and is down to 5.4.  However, her TSH is uncontrolled.  Is she taking her levothyroxine  as directed?

## 2023-09-03 NOTE — Evaluation (Signed)
 Physical Therapy Evaluation Patient Details Name: Sabrina Roberson MRN: [id number] DOB: [birth year] Today's Date: 09/03/2023  History of Present Illness  Sabrina Roberson is a 53 y.o. female who presents to Ed  09/02/23 with SOB, hypoxia, fever, recent fall, gait instability, Left lower lobe pneumonia.PMH: asthma, bronchiectasis, history of post influenza MRSA pneumonia in 1/' 25 requiring intubation, recurrent MRSA pneumonia, aspiration risk, history of smoking, TIA, diabetes type 2, hypertension, hyperlipidemia, CKD 3, hypothyroidism.  Clinical Impression  Pt admitted with above diagnosis.  Pt currently with functional limitations due to the deficits listed below (see PT Problem List). Pt will benefit from acute skilled PT to increase their independence and safety with mobility to allow discharge.     Patient appearssleepy/sluggish.  Patient is able to slowly answer questions.  Patient ambulated x 60' on 2 L , SPo2 99%  Patient presents with decreased balance, ataxic like gait, bilateral footdrop. Patient reports not using RW PTA and was ambulatory. Patient required at least min assistance for stability and balance  Patient  would benefit from OPPT at neuro rehab clinic.      If plan is discharge home, recommend the following: A little help with walking and/or transfers;A little help with bathing/dressing/bathroom;Assistance with cooking/housework;Help with stairs or ramp for entrance   Can travel by private vehicle        Equipment Recommendations None recommended by PT  Recommendations for Other Services       Functional Status Assessment Patient has had a recent decline in their functional status and demonstrates the ability to make significant improvements in function in a reasonable and predictable amount of time.     Precautions / Restrictions Precautions Precautions: Fall Precaution/Restrictions Comments: right foot drop      Mobility  Bed Mobility Overal bed mobility:  Needs Assistance Bed Mobility: Supine to Sit, Sit to Supine     Supine to sit: Supervision Sit to supine: Supervision   General bed mobility comments: no assistance    Transfers Overall transfer level: Needs assistance Equipment used: 1 person hand held assist Transfers: Sit to/from Stand Sit to Stand: Min assist           General transfer comment: steady assistance to  stand, noted decreased balance.    Ambulation/Gait Ambulation/Gait assistance: Mod assist Gait Distance (Feet): 60 Feet Assistive device: 1 person hand held assist Gait Pattern/deviations: Staggering right, Drifts right/left, Staggering left, Step-through pattern, Ataxic Gait velocity: decr     General Gait Details: unsteady gait, required assistance   to stabilize trunk, noted ataxia and  bilateral footdrop  Stairs            Wheelchair Mobility     Tilt Bed    Modified Rankin (Stroke Patients Only)       Balance Overall balance assessment: Needs assistance, History of Falls Sitting-balance support: No upper extremity supported, Feet supported Sitting balance-Leahy Scale: Fair     Standing balance support: Single extremity supported, During functional activity Standing balance-Leahy Scale: Poor Standing balance comment: gait ataxic, requires steady assistance for balance                             Pertinent Vitals/Pain Pain Assessment Pain Assessment: No/denies pain    Home Living Family/patient expects to be discharged to:: Private residence Living Arrangements: Spouse/significant other;Children   Type of Home: House Home Access: Stairs to enter       Home Layout: One level Home Equipment:  Shower seat      Prior Function Prior Level of Function : Needs assist             Mobility Comments: ambulatory, did not use RW       Extremity/Trunk Assessment        Lower Extremity Assessment Lower Extremity Assessment: RLE deficits/detail;LLE  deficits/detail RLE Deficits / Details: decreased  dorsiflexion, ataxic LLE Deficits / Details: decreased dorsiflexion, ataxic    Cervical / Trunk Assessment Cervical / Trunk Assessment: Normal  Communication   Communication Communication: No apparent difficulties Factors Affecting Communication: Reduced clarity of speech (speech slurred)    Cognition Arousal: Alert                             PT - Cognition Comments: appears sleepy and sluggish, is able to answer questions Following commands: Intact       Cueing Cueing Techniques: Verbal cues, Gestural cues     General Comments      Exercises     Assessment/Plan    PT Assessment Patient needs continued PT services  PT Problem List Decreased strength;Decreased mobility;Decreased coordination;Decreased knowledge of precautions;Decreased safety awareness;Impaired tone;Decreased activity tolerance;Decreased balance;Decreased knowledge of use of DME       PT Treatment Interventions DME instruction;Therapeutic activities;Gait training;Therapeutic exercise;Patient/family education;Stair training;Balance training;Functional mobility training;Neuromuscular re-education    PT Goals (Current goals can be found in the Care Plan section)  Acute Rehab PT Goals Patient Stated Goal: go home PT Goal Formulation: With patient/family Time For Goal Achievement: 09/10/23 Potential to Achieve Goals: Good    Frequency Min 2X/week     Co-evaluation PT/OT/SLP Co-Evaluation/Treatment: Yes Reason for Co-Treatment: To address functional/ADL transfers;For patient/therapist safety PT goals addressed during session: Mobility/safety with mobility;Balance OT goals addressed during session: ADL's and self-care       AM-PAC PT 6 Clicks Mobility  Outcome Measure Help needed turning from your back to your side while in a flat bed without using bedrails?: None Help needed moving from lying on your back to sitting on the side of a  flat bed without using bedrails?: None Help needed moving to and from a bed to a chair (including a wheelchair)?: A Little Help needed standing up from a chair using your arms (e.g., wheelchair or bedside chair)?: A Little Help needed to walk in hospital room?: A Lot Help needed climbing 3-5 steps with a railing? : A Lot 6 Click Score: 18    End of Session Equipment Utilized During Treatment: Gait belt Activity Tolerance: Patient tolerated treatment well Patient left: in bed;with call bell/phone within reach;with bed alarm set;with family/visitor present Nurse Communication: Mobility status PT Visit Diagnosis: Unsteadiness on feet (R26.81);Difficulty in walking, not elsewhere classified (R26.2);Other symptoms and signs involving the nervous system (R29.898);Repeated falls (R29.6);Other abnormalities of gait and mobility (R26.89)    Time: 8883-8862 PT Time Calculation (min) (ACUTE ONLY): 21 min   Charges:   PT Evaluation $PT Eval Low Complexity: 1 Low   PT General Charges $$ ACUTE PT VISIT: 1 Visit         [name] PT Acute Rehabilitation Services Office [phone]   Potters Darice Norris 09/03/2023, 1:15 PM

## 2023-09-03 NOTE — Progress Notes (Signed)
 PROGRESS NOTE  Sabrina Roberson    DOB: [birth year], 53 y.o.  FMW:[id number]    Code Status: Full Code   DOA: 09/02/2023   LOS: 0   Brief hospital course  Sabrina Roberson is a 53 y.o. female with hx asthma, bronchiectasis, history of post influenza MRSA pneumonia in 1/' 25 requiring intubation, recurrent MRSA pneumonia, aspiration risk, history of smoking, TIA, diabetes type 2, hypertension, hyperlipidemia, CKD 3, hypothyroidism, Recently seen PCP with recent history of ground-level fall, noted to have gait instability, and concern for paranoid behavior and ordered for CT Head and referred for psychiatry outpatient. Presents with recent productive cough and fever at home. Admitted with CAP with AHRF.    09/03/23 -Sabrina Roberson reports not taking her medications for at least a month due to affordability.   Assessment & Plan  Principal Problem:   Pneumonia  Community-acquired pneumonia History of MRSA pneumonia History of bronchiectasis Acute hypoxic respiratory failure, 2 L O2 Respiratory viral panel negative. MRSA screening pending Stable on 2Lnc. No oxygen use at baseline.  CTA chest negative for PE, demonstrates Progressive atelectasis in the right middle lobe and new 1.8 cm focal airspace infiltrate centrally in the left lower lobe. - Continue vancomycin  pharmacy to dose, ceftriaxone  1 g IV every 24 hours, azithromycin  500 mg daily - Albuterol  neb as needed, flutter valve, I-S - Wean O2 as able goal greater than 92%, will need home O2 desat screen prior to discharge   Recent ground-level fall Gait instability noted by PCP Mild encephalopathy  Husband reports gait instability x years. At PCP romberg abnormal, + dysdiadochokinesa noted, unable to tandem gait. On exam here + nystagmus, and dysmetria. Increased latency without frank disorientation. CT head outpatient negative for acute abnormality, x-ray of the L-spine noting L5 pars defect and 12 mm anterolisthesis L5 over S1.  -PT/OT  evaluation -- MRI brain with and without contrast- negative for abnormalities - continue thiamine    Paranoid behavior -Noted by PCP outpatient, has been referred to psychiatry. - May consider inpatient psychiatry consultation   Hypothyroidism: Sabrina Roberson endorses non-adherence due to affordability at least a month. TSH 64 on recent labs OP. T4 low.  - continue levothyroxine  at higher dose. Repeat TSH 6-8 weeks.   incidental findings:  On recent L-spine L5 pars defects with 12 mm anterolisthesis of L5 over S1: No motor weakness / sensory loss in LE    Chronic medical problems: History asthma: Without acute exacerbation, see management of pneumonia above. Aspiration risk: Per SLP okay for regular diet thin liquids with whole meds in pure, referred to ENT outpatient History of smoking: denies active   TIA: Not on antiPLT. Continue home atorvastatin   Diabetes type 2: Not requiring SSI can add if runs high  Hypertension: Home propranolol - held for borderline bradycardia Hyperlipidemia: see TIA  CKD 3A: Baseline Cr ~1   Body mass index is 26.78 kg/m.  VTE ppx: enoxaparin  (LOVENOX ) injection 40 mg Start: 09/03/23 1000  Diet:     Diet   Diet regular Room service appropriate? Yes; Fluid consistency: Thin   Consultants: None   Subjective 09/03/23    Pt reports feeling well. She states that she remembers all the time leading up to hospitalization and since. Denies SOB, chest pain. Endorses wheezing.    Objective  Blood pressure 121/74, pulse 60, temperature 97.9 F (36.6 C), temperature source Oral, resp. rate 15, height 5' 4 (1.626 m), weight 70.8 kg, SpO2 98%. No intake or output data in the 24 hours ending  09/03/23 0748 Filed Weights   09/02/23 1512  Weight: 70.8 kg    Physical Exam:  General: awake, alert, NAD HEENT: atraumatic, clear conjunctiva, anicteric sclera, MMM, hearing grossly normal Respiratory: normal respiratory effort. Scattered wheezing. Neg  rales Cardiovascular: quick capillary refill, normal S1/S2, RRR, no JVD, murmurs Gastrointestinal: soft, NT, ND Nervous: A&O x3. no gross focal neurologic deficits, normal speech Extremities: moves all equally, no edema, normal tone Skin: dry, intact, normal temperature, normal color. No rashes, lesions or ulcers on exposed skin Psychiatry: normal mood, congruent affect  Labs   I have personally reviewed the following labs and imaging studies CBC    Component Value Date/Time   WBC 5.0 09/03/2023 0236   RBC 3.53 (L) 09/03/2023 0236   HGB 10.2 (L) 09/03/2023 0236   HGB 11.7 07/08/2023 1052   HCT 34.8 (L) 09/03/2023 0236   HCT 38.0 07/08/2023 1052   PLT 284 09/03/2023 0236   PLT 401 07/08/2023 1052   MCV 98.6 09/03/2023 0236   MCV 92 07/08/2023 1052   MCH 28.9 09/03/2023 0236   MCHC 29.3 (L) 09/03/2023 0236   RDW 14.2 09/03/2023 0236   RDW 14.1 07/08/2023 1052   LYMPHSABS 2.3 07/08/2023 1052   MONOABS 0.4 01/24/2023 0550   EOSABS 0.4 07/08/2023 1052   BASOSABS 0.0 07/08/2023 1052      Latest Ref Rng & Units 09/03/2023    2:36 AM 09/02/2023    3:27 PM 07/24/2023    2:44 PM  BMP  Glucose 70 - 99 mg/dL 854  84  83   BUN 6 - 20 mg/dL 10  6  15    Creatinine 0.44 - 1.00 mg/dL 9.09  8.90  8.88   BUN/Creat Ratio 9 - 23   14   Sodium 135 - 145 mmol/L 137  144  143   Potassium 3.5 - 5.1 mmol/L 3.7  3.7  5.0   Chloride 98 - 111 mmol/L 99  104  101   CO2 22 - 32 mmol/L 28  30  27    Calcium  8.9 - 10.3 mg/dL 7.9  9.1  9.1     CT Angio Chest PE W and/or Wo Contrast Result Date: 09/02/2023 EXAM: CTA of the Chest with contrast for PE 09/02/2023 05:33:38 PM TECHNIQUE: CTA of the chest was performed after the administration of intravenous contrast. Multiplanar reformatted images are provided for review. MIP images are provided for review. Automated exposure control, iterative reconstruction, and/or weight based adjustment of the mA/kV was utilized to reduce the radiation dose to as low as  reasonably achievable. COMPARISON: 05/16/2023 CLINICAL HISTORY: Pulmonary embolism (PE) suspected, low to intermediate prob, positive D-dimer; cough, SOB, hypoxia. Triage note: Pt POV reporting chest pain, congestion, and SOB x1 week. Recurrent issues with pneumonia r/t hospitalization in Jan, was intubated. Called pulmonologist and was advised to come to [facility] for chest xray due to no appts available. FINDINGS: PULMONARY ARTERIES: Pulmonary arteries are adequately opacified for evaluation. No pulmonary embolism. Main pulmonary artery is normal in caliber. MEDIASTINUM: The heart and pericardium demonstrate no acute abnormality. Scattered calcified plaque in the descending thoracic and visualized abdominal aorta. LYMPH NODES: No mediastinal, hilar or axillary lymphadenopathy. LUNGS AND PLEURA: Progressive atelectasis in the right middle lobe. New 1.8 cm focal airspace infiltrate centrally in the left lower lobe (series 7, image 63). Resolving subpleural nodules previously noted posteriorly at the left lung base. No pleural effusion or pneumothorax. UPPER ABDOMEN: Cholecystectomy clips. SOFT TISSUES AND BONES: Spondylitic changes in the visualized lower  cervical spine. No acute bone or soft tissue abnormality. IMPRESSION: 1. No pulmonary embolism. 2. Progressive atelectasis in the right middle lobe and new 1.8 cm focal airspace infiltrate centrally in the left lower lobe. Electronically signed by: Katheleen Faes MD 09/02/2023 06:28 PM EDT RP Workstation: [id number]   DG Chest 2 View Result Date: 09/02/2023 CLINICAL DATA:  Chest pain. EXAM: DG CHEST 2V COMPARISON:  Jun 27, 2023 FINDINGS: The heart size and mediastinal contours are within normal limits. Low lung volumes are noted with mild, diffuse, chronic appearing increased interstitial lung markings. Mild left upper lobe linear scarring and/or atelectasis is seen within the left upper lobe. No pleural effusion or pneumothorax is identified. Radiopaque surgical clips  are seen within the right upper quadrant. The visualized skeletal structures are unremarkable. IMPRESSION: Low lung volumes with mild left upper lobe linear scarring and/or atelectasis. Electronically Signed   By: Suzen Dials M.D.   On: 09/02/2023 16:12   DG Hip Unilat W OR W/O Pelvis 1V Right Result Date: 09/02/2023 CLINICAL DATA:  Fall, right hip tenderness EXAM: DG HIP (WITH OR WITHOUT PELVIS) 1V RIGHT COMPARISON:  None Available. FINDINGS: Normal alignment. No acute fracture or dislocation. Joint spaces are preserved. Soft tissues are unremarkable IMPRESSION: 1. Negative. Electronically Signed   By: Dorethia Molt M.D.   On: 09/02/2023 15:38   DG Lumbar Spine Complete Result Date: 09/02/2023 CLINICAL DATA:  Back pain. EXAM: LUMBAR SPINE - COMPLETE 4+ VIEW COMPARISON:  Abdominal radiograph dated 01/29/2023. FINDINGS: Five lumbar type vertebra. There is no acute fracture or subluxation of the lumbar spine. There is L5 pars defects with 12 mm anterolisthesis of L5 over S1. There is degenerative changes at L5-S1 with spurring and osteophyte. The bones are osteopenic. Atherosclerotic calcification of the abdominal aorta. Right upper quadrant cholecystectomy clips. IMPRESSION: 1. No acute fracture or subluxation of the lumbar spine. 2. L5 pars defects with 12 mm anterolisthesis of L5 over S1. Electronically Signed   By: Vanetta Chou M.D.   On: 09/02/2023 15:36   CT HEAD WO CONTRAST ( ) Result Date: 09/01/2023 CLINICAL DATA:  Mental status change, unknown cause Impaired gait, memory, + nystagmus and symptoms of paranoia ongoing for 2-3 months with recent mechanical fall 1 week prior ; concerns for possible psychiatric versus neurological disease state. Failed Romberg testing. EXAM: CT HEAD WITHOUT CONTRAST TECHNIQUE: Contiguous axial images were obtained from the base of the skull through the vertex without intravenous contrast. RADIATION DOSE REDUCTION: This exam was performed according to the  departmental dose-optimization program which includes automated exposure control, adjustment of the mA and/or kV according to Sabrina Roberson size and/or use of iterative reconstruction technique. COMPARISON:  August 14, 2016 FINDINGS: Brain: The ventricles appear age appropriate. No mass effect or midline shift. Gray-white differentiation is preserved without focal attenuation abnormality.No evidence of acute territorial infarction, extra-axial fluid collection, hemorrhage, or mass lesion. The basilar cisterns are patent without downward herniation. The cerebellar hemispheres and vermis are well formed without mass lesion or focal attenuation abnormality. Vascular: No hyperdense vessel. Skull: Normal. Negative for fracture or focal lesion. Sinuses/Orbits: The paranasal sinuses and mastoids are clear.The globes appear intact. No retrobulbar hematoma. Other: None. IMPRESSION: No acute intracranial abnormality, specifically, no acute hemorrhage, territorial infarction, or intracranial mass. Electronically Signed   By: Rogelia Myers M.D.   On: 09/01/2023 16:49   Disposition Plan & Communication  Sabrina Roberson status: Observation  Admitted From: Home Planned disposition location: TBD Anticipated discharge date: TBD pending clinical workup   Family  Communication: husband at bedside     Author: Marien LITTIE Piety, DO [facility] 09/03/2023, 7:48 AM   Available by Epic secure chat 7AM-7PM. If 7PM-7AM, please contact night-coverage.  [facility] contact information found on [email or web address].

## 2023-09-03 NOTE — Evaluation (Signed)
 Occupational Therapy Evaluation Patient Details Name: Sabrina Roberson MRN: [id number] DOB: [birth year] Today's Date: 09/03/2023   History of Present Illness   Sabrina Roberson is a 53 yr old female who presents to ED with SOB, hypoxia, fever, recent fall, gait instabilit. Found to have pneumonia.PMH: asthma, bronchiectasis, history of post influenza MRSA pneumonia in 1/' 25 requiring intubation, recurrent MRSA pneumonia, aspiration risk, history of smoking, TIA, diabetes type 2, hypertension, hyperlipidemia, CKD 3, hypothyroidism.     Clinical Impressions The pt is currently presenting the below listed deficits (see OT problem list). During the session, she was also noted to be unsteadiness in standing, with an ataxic like gait pattern. She also appeared to be with slight deconditioning and suspected bilateral foot drop. She was noted to be on 2L O2 with her O2 saturation being 99% on room air at rest, and the same on 2L with activity. She will benefit from further OT services to maximize her independence with self-care tasks. Recommend she return home at discharge with family assist as needed and consideration of outpatient therapy.      If plan is discharge home, recommend the following:   Help with stairs or ramp for entrance;Assistance with cooking/housework     Functional Status Assessment   Patient has had a recent decline in their functional status and demonstrates the ability to make significant improvements in function in a reasonable and predictable amount of time.     Equipment Recommendations   Tub/shower seat     Recommendations for Other Services         Precautions/Restrictions   Precautions Precautions: Fall Precaution/Restrictions Comments: right foot drop Restrictions Weight Bearing Restrictions Per Provider Order: No     Mobility Bed Mobility Overal bed mobility: Needs Assistance Bed Mobility: Supine to Sit, Sit to Supine     Supine to sit:  Supervision Sit to supine: Supervision   General bed mobility comments: no assistance    Transfers Overall transfer level: Needs assistance Equipment used: 1 person hand held assist Transfers: Sit to/from Stand Sit to Stand: Min assist           General transfer comment: steady assistance to  stand, noted decreased balance.      Balance     Sitting balance-Leahy Scale: Fair       Standing balance-Leahy Scale: Poor Standing balance comment: gait ataxic, requires steady assistance for balance             ADL either performed or assessed with clinical judgement   ADL Overall ADL's : Needs assistance/impaired Eating/Feeding: Independent;Sitting   Grooming: Set up;Sitting           Upper Body Dressing : Set up;Sitting   Lower Body Dressing: Contact guard assist;Sit to/from stand   Toilet Transfer: Contact guard assist;Ambulation;Rolling walker (2 wheels)   Toileting- Clothing Manipulation and Hygiene: Contact guard assist;Sit to/from stand Toileting - Clothing Manipulation Details (indicate cue type and reason): at bathroom level, based on clinical judgement             Pertinent Vitals/Pain Pain Assessment Pain Assessment: No/denies pain     Extremity/Trunk Assessment Upper Extremity Assessment Upper Extremity Assessment: Right hand dominant;Overall WFL for tasks assessed   Lower Extremity Assessment Lower Extremity Assessment: Generalized weakness RLE Deficits / Details: decreased  dorsiflexion, ataxic LLE Deficits / Details: decreased dorsiflexion, ataxic      Communication Communication Communication: No apparent difficulties Factors Affecting Communication: Reduced clarity of speech   Cognition Arousal: Alert Behavior  During Therapy: WFL for tasks assessed/performed          Following commands: Intact       Cueing  General Comments   Cueing Techniques: Verbal cues;Gestural cues              Home Living Family/patient  expects to be discharged to:: Private residence Living Arrangements: Spouse/significant other Available Help at Discharge: Family Type of Home: House Home Access: Stairs to enter     Home Layout: Two level;Able to live on main level with bedroom/bathroom Alternate Level Stairs-Number of Steps: basement and main level to home       Bathroom Toilet: Standard     Home Equipment: Shower seat          Prior Functioning/Environment Prior Level of Function : Needs assist             Mobility Comments: ambulatory, did not use RW ADLs Comments: Independent in ADLs and shared household chores with spouse. She drives, however infrequently.    OT Problem List: Decreased strength;Impaired balance (sitting and/or standing);Decreased knowledge of use of DME or AE;Impaired sensation   OT Treatment/Interventions: Self-care/ADL training;Therapeutic exercise;Therapeutic activities;Energy conservation;Patient/family education;DME and/or AE instruction;Balance training      OT Goals(Current goals can be found in the care plan section)   Acute Rehab OT Goals OT Goal Formulation: With patient Time For Goal Achievement: 09/17/23 Potential to Achieve Goals: Good ADL Goals Pt Will Perform Grooming: with modified independence;standing Pt Will Perform Lower Body Dressing: with modified independence;sitting/lateral leans;sit to/from stand Pt Will Transfer to Toilet: with modified independence;ambulating Pt Will Perform Toileting - Clothing Manipulation and hygiene: with modified independence;sit to/from stand   OT Frequency:  Min 2X/week    Co-evaluation PT/OT/SLP Co-Evaluation/Treatment: Yes Reason for Co-Treatment: To address functional/ADL transfers;For patient/therapist safety PT goals addressed during session: Mobility/safety with mobility;Balance OT goals addressed during session: ADL's and self-care      AM-PAC OT 6 Clicks Daily Activity     Outcome Measure Help from another  person eating meals?: None Help from another person taking care of personal grooming?: A Little Help from another person toileting, which includes using toliet, bedpan, or urinal?: A Little Help from another person bathing (including washing, rinsing, drying)?: A Little Help from another person to put on and taking off regular upper body clothing?: None Help from another person to put on and taking off regular lower body clothing?: A Little 6 Click Score: 20   End of Session Equipment Utilized During Treatment: Gait belt Nurse Communication: Mobility status  Activity Tolerance: Patient tolerated treatment well Patient left: in bed;with call bell/phone within reach;with family/visitor present  OT Visit Diagnosis: Unsteadiness on feet (R26.81);Muscle weakness (generalized) (M62.81);Other abnormalities of gait and mobility (R26.89)                Time: 8883-8862 OT Time Calculation (min): 21 min Charges:  OT General Charges $OT Visit: 1 Visit OT Evaluation $OT Eval Moderate Complexity: 1 Mod   Susanne Baumgarner L Rehaan Viloria, OTR/L 09/03/2023, 4:41 PM

## 2023-09-03 NOTE — H&P (Signed)
 History and Physical    Sabrina Roberson FMW:[id number] DOB: 10/01/1970 DOA: 09/02/2023  PCP: Knute Thersia Bitters, FNP   Patient coming from: Transfer from [facility] ED   Chief Complaint:  Chief Complaint  Patient presents with   Shortness of Breath   Chest Pain    HPI: History mainly provided by family at bedside due to patients somonlence  Sabrina Roberson is a 53 y.o. female with hx of asthma, bronchiectasis, history of post influenza MRSA pneumonia in 1/' 25 requiring intubation, recurrent MRSA pneumonia, aspiration risk, history of smoking, TIA, diabetes type 2, hypertension, hyperlipidemia, CKD 3, hypothyroidism, Recently seen PCP with recent history of ground-level fall, noted to have gait instability, and concern for paranoid behavior and ordered for CT Head and referred for psychiatry outpatient. Presents with recent productive cough and fever at home   Husband reports over past few days more SOB, increased cough and sputum change in character to green / dark brown. No fever, chills. No associated chest pain. No dysphagia, N/V. Was Rx'd for Augmentin  + Prednisone  yesterday by PCP.   Otherwise reports gait instability x years, acknowledges recent fall. Patient denies any injury. No HA, speech change, vision changes, focal numbness or weakness. Husband also notes that her thinking has been paranoid recently.    Review of Systems:  ROS complete and negative except as marked above   Allergies  Allergen Reactions   Latex Anaphylaxis and Swelling   Codeine Nausea And Vomiting    Other reaction(s): GI Upset (intolerance), Vomiting (intolerance)   Ibuprofen Other (See Comments)    GI upset, burning sensation   Oxycodone  Nausea And Vomiting    Pre-medication with phenergan  needed.   Tape Itching and Rash    Please use paper tape only.    Prior to Admission medications   Medication Sig Start Date End Date Taking? Authorizing Provider  albuterol  (VENTOLIN  HFA) 108 (90 Base)  MCG/ACT inhaler TAKE 2 PUFFS BY MOUTH EVERY 6 HOURS AS NEEDED FOR WHEEZE OR SHORTNESS OF BREATH 08/14/23   Caudle, Thersia Bitters, FNP  amoxicillin -clavulanate (AUGMENTIN ) 875-125 MG tablet Take 1 tablet by mouth 2 (two) times daily. 09/02/23   Ruthell Lauraine FALCON, NP  ARIPiprazole  (ABILIFY ) 5 MG tablet Take 5 mg by mouth daily. 08/27/23   [provider]  atorvastatin  (LIPITOR) 20 MG tablet Take 1 tablet (20 mg total) by mouth at bedtime. 11/04/22   Caudle, Thersia Bitters, FNP  diazepam  (VALIUM ) 5 MG tablet TAKE 1 TABLET (5 MG TOTAL) BY MOUTH TWICE A DAY AS NEEDED FOR ANXIETY 06/18/23   Caudle, Thersia Bitters, FNP  dicyclomine  (BENTYL ) 10 MG capsule Take 1 capsule (10 mg total) by mouth 4 (four) times daily -  before meals and at bedtime. Patient taking differently: Take 10 mg by mouth 3 (three) times daily as needed for spasms. 10/10/22   Caudle, Thersia Bitters, FNP  furosemide  (LASIX ) 20 MG tablet Take 1 tablet (20 mg total) by mouth daily for 5 days. 05/29/23 09/01/23  Knute Thersia Bitters, FNP  gabapentin  (NEURONTIN ) 600 MG tablet TAKE 1 TABLET BY MOUTH AT BEDTIME. 06/26/23   Caudle, Thersia Bitters, FNP  Glycopyrrolate-Formoterol (BEVESPI  AEROSPHERE) 9-4.8 MCG/ACT AERO Inhale 2 puffs into the lungs 2 (two) times daily. 07/14/23   Kara Dorn NOVAK, MD  ipratropium-albuterol  (DUONEB) 0.5-2.5 (3) MG/3ML SOLN Take 3 mLs by nebulization every 4 (four) hours as needed (shortness of breath, wheezing, or cough). 03/04/23   Knute Thersia Bitters, FNP  levothyroxine  (SYNTHROID ) 88 MCG tablet TAKE  1 TABLET BY MOUTH DAILY BEFORE BREAKFAST. 07/13/23   Caudle, Thersia Bitters, FNP  metFORMIN  (GLUCOPHAGE -XR) 500 MG 24 hr tablet Take 1 tablet (500 mg total) by mouth daily with breakfast. 11/04/22   Caudle, Thersia Bitters, FNP  mupirocin  ointment (BACTROBAN ) 2 % Apply to affected area TID for 7 days. 09/01/23   Caudle, Thersia Bitters, FNP  pantoprazole  (PROTONIX ) 40 MG tablet Take 1 tablet (40 mg total) by mouth daily. 02/06/23 09/01/23   Leotis Bogus, MD  predniSONE  (DELTASONE ) 10 MG tablet Prednisone  taper; 10 mg tablets: 4 tabs x 2 days, 3 tabs x 2 days, 2 tabs x 2 days 1 tab x 2 days then stop. 09/02/23   Ruthell Lauraine FALCON, NP  promethazine  (PHENERGAN ) 25 MG tablet Take 1 tablet (25 mg total) by mouth every 8 (eight) hours as needed for nausea or vomiting. 04/03/23   Caudle, Thersia Bitters, FNP  propranolol  (INDERAL ) 80 MG tablet Take 1 tablet (80 mg total) by mouth daily. 10/10/22   Caudle, Thersia Bitters, FNP  sodium chloride  HYPERTONIC 3 % nebulizer solution Take by nebulization as needed for cough. Use 3 mL Twice daily until symptoms improve 03/20/23   Cobb, Comer GAILS, NP  traZODone  (DESYREL ) 100 MG tablet Take 100 mg by mouth at bedtime. 08/23/23   [provider]    Past Medical History:  Diagnosis Date   Abdominal pain 02/11/2011   Abnormal CT of the chest 02/04/2014   CT chest 08/2013:  atx in RML and lingula  CT angio 12/2013:  No PE, RML scarring, abnormal density in lingula  (done at [facility])     Allergy     Anxiety    Arthritis    Asthma    Bruises easily    Chronic chest wall pain    Chronic pain    Cough    Depression    Diabetes mellitus without complication (HCC)    Dyspnea    chronic   Dysuria 09/18/2022   Encounter for therapeutic drug monitoring 02/11/2011   Fibromyalgia    Functional movement disorder    GERD (gastroesophageal reflux disease)    Headache    Hearing loss    High cholesterol    History of hiatal hernia    Hypertension    Hypothyroidism    Kidney disease    stage 3   Leg swelling    both   Nausea 02/11/2011   Nausea and vomiting 04/01/2013   Pain management    PONV (postoperative nausea and vomiting)    Sebaceous cyst of breast, right subaereolar 10/30/2010   Septic shock (HCC) 01/19/2023   SMAS (superior mesenteric artery syndrome) (HCC)    Thyroid  disease    TIA (transient ischemic attack)    Tic disorder    TMJ (dislocation of temporomandibular joint)     Transient cerebral ischemia    Type 2 diabetes mellitus with complications (HCC)     Past Surgical History:  Procedure Laterality Date   AORTA - SUPERIOR MESENTERIC AND AORTA - RENAL ARTERY BYPASS GRAFT     APPENDECTOMY     BREAST CYST EXCISION Right    BREAST EXCISIONAL BIOPSY     CHOLECYSTECTOMY     cyst removed     FLEXIBLE BRONCHOSCOPY Bilateral 06/27/2023   Procedure: BRONCHOSCOPY, FLEXIBLE;  Surgeon: Kara Dorn NOVAK, MD;  Location: [facility] ENDOSCOPY;  Service: Pulmonary;  Laterality: Bilateral;   HERNIA REPAIR     OOPHORECTOMY     bilateral   TEE WITHOUT CARDIOVERSION N/A  06/18/2012   Procedure: TRANSESOPHAGEAL ECHOCARDIOGRAM (TEE);  Surgeon: Jerel Balding, MD;  Location: [facility] ENDOSCOPY;  Service: Cardiovascular;  Laterality: N/A;   TOTAL ABDOMINAL HYSTERECTOMY       reports that she quit smoking about 7 months ago. Her smoking use included cigarettes. She has a 7.5 pack-year smoking history. She has never used smokeless tobacco. She reports that she does not drink alcohol and does not use drugs.  Family History  Problem Relation Age of Onset   Asthma Father    Breast cancer Sister    Hypertension Sister    Parkinson's disease Maternal Aunt    Pulmonary fibrosis Maternal Grandmother    Emphysema Maternal Grandmother    Heart disease Maternal Grandmother    Breast cancer Maternal Grandmother    Asthma Maternal Grandfather    Heart disease Maternal Grandfather    Cancer Paternal Grandmother        breast   Heart disease Paternal Grandmother    Breast cancer Paternal Grandmother    Asthma Paternal Grandmother    Leukemia Paternal Grandfather    Heart disease Paternal Grandfather    Clotting disorder Paternal Grandfather      Physical Exam: Vitals:   09/02/23 1900 09/02/23 1939 09/02/23 2100 09/02/23 2230  BP: (!) 105/56  (!) 100/52 109/67  Pulse: (!) 55  (!) 57 64  Resp: (!) 22  20 18   Temp:  98.9 F (37.2 C)  98.1 F (36.7 C)  TempSrc:  Oral    SpO2: 96%   96% 100%  Weight:      Height:        Gen: Somnolent, gradually more awake and alert during exam. NAD   CV: Regular, normal S1, S2, no murmurs  Resp: Normal WOB, Rales in the R lower lung fields. No significant wheezing   Abd: Flat, normoactive, nontender MSK: Symmetric, trace   Skin: No rashes or lesions to exposed skin  Neuro: Somnolent, gradually more awake and alert during exam. Fully oriented (took long time to answer year / month) Increased latency of response. Voice slighly hoarse but no aphasia. CN 2-12 intact. Motor is 5/5 and symmetric, sensation intact and equal to fine touch. FTN with tremor near target. Heel to shin dysmetric.  Psych: euthymic, appropriate    Data review:   Labs reviewed, notable for:   Chemistries unremarkable WBC 4 Recent TSH 62  Micro:  Results for orders placed or performed during the hospital encounter of 09/02/23  Resp panel by RT-PCR (RSV, Flu A&B, Covid) Anterior Nasal Swab     Status: None   Collection Time: 09/02/23  4:22 PM   Specimen: Anterior Nasal Swab  Result Value Ref Range Status   SARS Coronavirus 2 by RT PCR NEGATIVE NEGATIVE Final    Comment: (NOTE) SARS-CoV-2 target nucleic acids are NOT DETECTED.  The SARS-CoV-2 RNA is generally detectable in upper respiratory specimens during the acute phase of infection. The lowest concentration of SARS-CoV-2 viral copies this assay can detect is 138 copies/mL. A negative result does not preclude SARS-Cov-2 infection and should not be used as the sole basis for treatment or other patient management decisions. A negative result may occur with  improper specimen collection/handling, submission of specimen other than nasopharyngeal swab, presence of viral mutation(s) within the areas targeted by this assay, and inadequate number of viral copies(<138 copies/mL). A negative result must be combined with clinical observations, patient history, and epidemiological information. The expected  result is Negative.  Fact Sheet for Patients:  BloggerCourse.com  Fact Sheet for Healthcare Providers:  SeriousBroker.it  This test is no t yet approved or cleared by the [place]  FDA and  has been authorized for detection and/or diagnosis of SARS-CoV-2 by FDA under an Emergency Use Authorization (EUA). This EUA will remain  in effect (meaning this test can be used) for the duration of the COVID-19 declaration under Section 564(b)(1) of the Act, 21 U.S.C.section 360bbb-3(b)(1), unless the authorization is terminated  or revoked sooner.       Influenza A by PCR NEGATIVE NEGATIVE Final   Influenza B by PCR NEGATIVE NEGATIVE Final    Comment: (NOTE) The Xpert Xpress SARS-CoV-2/FLU/RSV plus assay is intended as an aid in the diagnosis of influenza from Nasopharyngeal swab specimens and should not be used as a sole basis for treatment. Nasal washings and aspirates are unacceptable for Xpert Xpress SARS-CoV-2/FLU/RSV testing.  Fact Sheet for Patients: BloggerCourse.com  Fact Sheet for Healthcare Providers: SeriousBroker.it  This test is not yet approved or cleared by the [place]  FDA and has been authorized for detection and/or diagnosis of SARS-CoV-2 by FDA under an Emergency Use Authorization (EUA). This EUA will remain in effect (meaning this test can be used) for the duration of the COVID-19 declaration under Section 564(b)(1) of the Act, 21 U.S.C. section 360bbb-3(b)(1), unless the authorization is terminated or revoked.     Resp Syncytial Virus by PCR NEGATIVE NEGATIVE Final    Comment: (NOTE) Fact Sheet for Patients: BloggerCourse.com  Fact Sheet for Healthcare Providers: SeriousBroker.it  This test is not yet approved or cleared by the [place]  FDA and has been authorized for detection and/or diagnosis of  SARS-CoV-2 by FDA under an Emergency Use Authorization (EUA). This EUA will remain in effect (meaning this test can be used) for the duration of the COVID-19 declaration under Section 564(b)(1) of the Act, 21 U.S.C. section 360bbb-3(b)(1), unless the authorization is terminated or revoked.  Performed at [facility], [address], [place], [place] [address]     Imaging reviewed:  CT Angio Chest PE W and/or Wo Contrast Result Date: 09/02/2023 EXAM: CTA of the Chest with contrast for PE 09/02/2023 05:33:38 PM TECHNIQUE: CTA of the chest was performed after the administration of intravenous contrast. Multiplanar reformatted images are provided for review. MIP images are provided for review. Automated exposure control, iterative reconstruction, and/or weight based adjustment of the mA/kV was utilized to reduce the radiation dose to as low as reasonably achievable. COMPARISON: 05/16/2023 CLINICAL HISTORY: Pulmonary embolism (PE) suspected, low to intermediate prob, positive D-dimer; cough, SOB, hypoxia. Triage note: Pt POV reporting chest pain, congestion, and SOB x1 week. Recurrent issues with pneumonia r/t hospitalization in Jan, was intubated. Called pulmonologist and was advised to come to Ed for chest xray due to no appts available. FINDINGS: PULMONARY ARTERIES: Pulmonary arteries are adequately opacified for evaluation. No pulmonary embolism. Main pulmonary artery is normal in caliber. MEDIASTINUM: The heart and pericardium demonstrate no acute abnormality. Scattered calcified plaque in the descending thoracic and visualized abdominal aorta. LYMPH NODES: No mediastinal, hilar or axillary lymphadenopathy. LUNGS AND PLEURA: Progressive atelectasis in the right middle lobe. New 1.8 cm focal airspace infiltrate centrally in the left lower lobe (series 7, image 63). Resolving subpleural nodules previously noted posteriorly at the left lung base. No pleural effusion or pneumothorax.  UPPER ABDOMEN: Cholecystectomy clips. SOFT TISSUES AND BONES: Spondylitic changes in the visualized lower cervical spine. No acute bone or soft tissue abnormality. IMPRESSION: 1. No pulmonary embolism. 2. Progressive  atelectasis in the right middle lobe and new 1.8 cm focal airspace infiltrate centrally in the left lower lobe. Electronically signed by: Katheleen Faes MD 09/02/2023 06:28 PM EDT RP Workstation: [id number]   DG Chest 2 View Result Date: 09/02/2023 CLINICAL DATA:  Chest pain. EXAM: DG CHEST 2V COMPARISON:  Jun 27, 2023 FINDINGS: The heart size and mediastinal contours are within normal limits. Low lung volumes are noted with mild, diffuse, chronic appearing increased interstitial lung markings. Mild left upper lobe linear scarring and/or atelectasis is seen within the left upper lobe. No pleural effusion or pneumothorax is identified. Radiopaque surgical clips are seen within the right upper quadrant. The visualized skeletal structures are unremarkable. IMPRESSION: Low lung volumes with mild left upper lobe linear scarring and/or atelectasis. Electronically Signed   By: Suzen Dials M.D.   On: 09/02/2023 16:12   DG Hip Unilat W OR W/O Pelvis 1V Right Result Date: 09/02/2023 CLINICAL DATA:  Fall, right hip tenderness EXAM: DG HIP (WITH OR WITHOUT PELVIS) 1V RIGHT COMPARISON:  None Available. FINDINGS: Normal alignment. No acute fracture or dislocation. Joint spaces are preserved. Soft tissues are unremarkable IMPRESSION: 1. Negative. Electronically Signed   By: Dorethia Molt M.D.   On: 09/02/2023 15:38   DG Lumbar Spine Complete Result Date: 09/02/2023 CLINICAL DATA:  Back pain. EXAM: LUMBAR SPINE - COMPLETE 4+ VIEW COMPARISON:  Abdominal radiograph dated 01/29/2023. FINDINGS: Five lumbar type vertebra. There is no acute fracture or subluxation of the lumbar spine. There is L5 pars defects with 12 mm anterolisthesis of L5 over S1. There is degenerative changes at L5-S1 with spurring and  osteophyte. The bones are osteopenic. Atherosclerotic calcification of the abdominal aorta. Right upper quadrant cholecystectomy clips. IMPRESSION: 1. No acute fracture or subluxation of the lumbar spine. 2. L5 pars defects with 12 mm anterolisthesis of L5 over S1. Electronically Signed   By: Vanetta Chou M.D.   On: 09/02/2023 15:36   CT HEAD WO CONTRAST ( ) Result Date: 09/01/2023 CLINICAL DATA:  Mental status change, unknown cause Impaired gait, memory, + nystagmus and symptoms of paranoia ongoing for 2-3 months with recent mechanical fall 1 week prior ; concerns for possible psychiatric versus neurological disease state. Failed Romberg testing. EXAM: CT HEAD WITHOUT CONTRAST TECHNIQUE: Contiguous axial images were obtained from the base of the skull through the vertex without intravenous contrast. RADIATION DOSE REDUCTION: This exam was performed according to the departmental dose-optimization program which includes automated exposure control, adjustment of the mA and/or kV according to patient size and/or use of iterative reconstruction technique. COMPARISON:  August 14, 2016 FINDINGS: Brain: The ventricles appear age appropriate. No mass effect or midline shift. Gray-white differentiation is preserved without focal attenuation abnormality.No evidence of acute territorial infarction, extra-axial fluid collection, hemorrhage, or mass lesion. The basilar cisterns are patent without downward herniation. The cerebellar hemispheres and vermis are well formed without mass lesion or focal attenuation abnormality. Vascular: No hyperdense vessel. Skull: Normal. Negative for fracture or focal lesion. Sinuses/Orbits: The paranasal sinuses and mastoids are clear.The globes appear intact. No retrobulbar hematoma. Other: None. IMPRESSION: No acute intracranial abnormality, specifically, no acute hemorrhage, territorial infarction, or intracranial mass. Electronically Signed   By: Rogelia Myers M.D.   On: 09/01/2023  16:49    EKG: SR with u wave, no acute ischemic changes  ED Course:  Treated with ceftriaxone  and azithromycin , methylpred, nebs.,  vancomycin  added after discussing with [facility].  Sats 89% on room air, 86% with ambulation.  She was placed on  2 L O2.   Assessment/Plan:  53 y.o. female with hx asthma, bronchiectasis, history of post influenza MRSA pneumonia in 1/' 25 requiring intubation, recurrent MRSA pneumonia, aspiration risk, history of smoking, TIA, diabetes type 2, hypertension, hyperlipidemia, CKD 3, hypothyroidism, Recently seen PCP with recent history of ground-level fall, noted to have gait instability, and concern for paranoid behavior and ordered for CT Head and referred for psychiatry outpatient. Presents with recent productive cough and fever at home. Admitted with CAP with AHRF.    Community-acquired pneumonia History of MRSA pneumonia History of bronchiectasis Acute hypoxic respiratory failure, 2 L O2 P/w few days worsening SOB / cough + green / brown sputum. Sats 89% on room air, 86% with ambulation.  She was placed on 2 L O2.  CTA chest negative for PE, demonstrates Progressive atelectasis in the right middle lobe and new 1.8 cm focal airspace infiltrate centrally in the left lower lobe. - Continue vancomycin  pharmacy to dose, ceftriaxone  1 g IV every 24 hours, azithromycin  500 mg daily - Check RVP, sputum culture - Albuterol  neb as needed, flutter valve, I-S - Wean O2 as able goal greater than 92%, will need home O2 desat screen prior to discharge  Recent ground-level fall Gait instability noted by PCP Mild encephalopathy  Husband reports gait instability x years. At PCP romberg abnormal, + dysdiadochokinesa noted, unable to tandem gait. On exam here + nystagmus, and dysmetria. Increased latency without frank disorientation. CT head outpatient negative for acute abnormality, x-ray of the L-spine noting L5 pars defect and 12 mm anterolisthesis L5 over S1.  -PT/OT  evaluation -- MRI brain with and without contrast r/o cerebellar lesion  -Check B1, B12, RPR.  -- Start on high dose thiamine  for possible Wernicke encephalopathy, can stop if B1 wnl   Paranoid behavior -Noted by PCP outpatient, has been referred to psychiatry. - May consider inpatient psychiatry consultation  Hypothyroidism: TSH 64 on recent labs OP  -Check free T4 and total T3; repeat TFT in 6 to 8 weeks -Levothyroxine  last filled in 4/' 25 for 30-day course suspect nonadherence. -Resume her last dose of levothyroxine  88 mcg daily  incidental findings:  On recent L-spine L5 pars defects with 12 mm anterolisthesis of L5 over S1: No motor weakness / sensory loss in LE   Chronic medical problems: History asthma: Without acute exacerbation, see management of pneumonia above. Aspiration risk: Per SLP okay for regular diet thin liquids with whole meds in pure, referred to ENT outpatient History of smoking: denies active   TIA: Not on antiPLT. Continue home atorvastatin   Diabetes type 2: Not requiring SSI can add if runs high  Hypertension: Continue propranolol  Hyperlipidemia: see TIA  CKD 3A: Baseline Cr ~1    Body mass index is 26.78 kg/m.    DVT prophylaxis:  Lovenox  Code Status:  Full Code Diet:  Diet Orders (From admission, onward)    None      Family Communication:  Yes discussed with husband at bedside   Consults:  None  Admission status:   Observation, Telemetry bed  Severity of Illness: The appropriate patient status for this patient is OBSERVATION. Observation status is judged to be reasonable and necessary in order to provide the required intensity of service to ensure the patient's safety. The patient's presenting symptoms, physical exam findings, and initial radiographic and laboratory data in the context of their medical condition is felt to place them at decreased risk for further clinical deterioration. Furthermore, it is anticipated that the patient will  be  medically stable for discharge from the hospital within 2 midnights of admission.    Dorn Dawson, MD [facility]  How to contact the [facility] Attending or Consulting provider 7A - 7P or covering provider during after hours 7P -7A, for this patient.  Check the care team in [facility] and look for a) attending/consulting [facility] provider listed and b) the [facility] team listed Log into [email or web address] and use [place]'s universal password to access. If you do not have the password, please contact the hospital operator. Locate the [facility] provider you are looking for under [facility] and page to a number that you can be directly reached. If you still have difficulty reaching the provider, please page the [facility] (Director on Call) for the Hospitalists listed on amion for assistance.  09/03/2023, 12:18 AM

## 2023-09-03 NOTE — Progress Notes (Signed)
 X-ray for hip is negative for acute fracture or dislocation.

## 2023-09-03 NOTE — Progress Notes (Signed)
 Pharmacy Antibiotic Note  Sabrina Roberson is a 53 y.o. female admitted on 09/02/2023 with SOB and chest pain.  Pharmacy has been consulted for vancomycin  dosing, 1st dose given in the ED  Plan: Vancomycin  1250mg  IV q24h (AUC 509.1, Scr 1.09) Follow renal function, cultures and clinical course  Height: 5' 4 (162.6 cm) Weight: 70.8 kg (156 lb) IBW/kg (Calculated) : 54.7  Temp (24hrs), Avg:98.6 F (37 C), Min:98.1 F (36.7 C), Max:98.9 F (37.2 C)  Recent Labs  Lab 09/02/23 1527 09/02/23 1602  WBC 4.8  --   CREATININE 1.09*  --   LATICACIDVEN  --  <0.3*    Estimated Creatinine Clearance: 58.9 mL/min (A) (by C-G formula based on SCr of 1.09 mg/dL (H)).    Allergies  Allergen Reactions   Latex Anaphylaxis and Swelling   Codeine Nausea And Vomiting    Other reaction(s): GI Upset (intolerance), Vomiting (intolerance)   Ibuprofen Other (See Comments)    GI upset, burning sensation   Oxycodone  Nausea And Vomiting    Pre-medication with phenergan  needed.   Tape Itching and Rash    Please use paper tape only.    Antimicrobials this admission: 8/5 azith x 1 8/5 CTX x 1 8/6 vanc >>  Dose adjustments this admission:   Microbiology results: 8/5 BCx: 8/5 Sputum:     Leeroy Mace RPh 09/03/2023, 12:59 AM

## 2023-09-03 NOTE — Telephone Encounter (Signed)
 Nothing further needed

## 2023-09-04 DIAGNOSIS — J9621 Acute and chronic respiratory failure with hypoxia: Secondary | ICD-10-CM | POA: Diagnosis not present

## 2023-09-04 DIAGNOSIS — J189 Pneumonia, unspecified organism: Secondary | ICD-10-CM | POA: Diagnosis not present

## 2023-09-04 LAB — CBC
HCT: 32.4 % — ABNORMAL LOW (ref 36.0–46.0)
Hemoglobin: 9.3 g/dL — ABNORMAL LOW (ref 12.0–15.0)
MCH: 28.8 pg (ref 26.0–34.0)
MCHC: 28.7 g/dL — ABNORMAL LOW (ref 30.0–36.0)
MCV: 100.3 fL — ABNORMAL HIGH (ref 80.0–100.0)
Platelets: 238 K/uL (ref 150–400)
RBC: 3.23 MIL/uL — ABNORMAL LOW (ref 3.87–5.11)
RDW: 14 % (ref 11.5–15.5)
WBC: 5.2 K/uL (ref 4.0–10.5)
nRBC: 0 % (ref 0.0–0.2)

## 2023-09-04 LAB — BASIC METABOLIC PANEL WITH GFR
Anion gap: 8 (ref 5–15)
BUN: 13 mg/dL (ref 6–20)
CO2: 32 mmol/L (ref 22–32)
Calcium: 8.5 mg/dL — ABNORMAL LOW (ref 8.9–10.3)
Chloride: 102 mmol/L (ref 98–111)
Creatinine, Ser: 0.89 mg/dL (ref 0.44–1.00)
GFR, Estimated: >60 mL/min (ref >60)
Glucose, Bld: 80 mg/dL (ref 70–99)
Potassium: 3.9 mmol/L (ref 3.5–5.1)
Sodium: 142 mmol/L (ref 135–145)

## 2023-09-04 LAB — T3: T3, Total: 47 ng/dL — ABNORMAL LOW (ref 71–180)

## 2023-09-04 MED ORDER — VANCOMYCIN HCL 1500 MG/300ML IV SOLN
1500.0000 mg | INTRAVENOUS | Status: DC
  Administered 2023-09-05 – 2023-09-07 (×4): 1500 mg via INTRAVENOUS
  Filled 2023-09-04 (×4): qty 300

## 2023-09-04 MED ORDER — IPRATROPIUM-ALBUTEROL 0.5-2.5 (3) MG/3ML IN SOLN
3.0000 mL | Freq: Three times a day (TID) | RESPIRATORY_TRACT | Status: DC
  Administered 2023-09-05 (×2): 3 mL via RESPIRATORY_TRACT
  Filled 2023-09-04 (×2): qty 3

## 2023-09-04 MED ORDER — ALBUTEROL SULFATE (2.5 MG/3ML) 0.083% IN NEBU: 2.5000 mg | INHALATION_SOLUTION | Freq: Four times a day (QID) | RESPIRATORY_TRACT | Status: DC | PRN

## 2023-09-04 NOTE — Progress Notes (Signed)
 PROGRESS NOTE  Sabrina Roberson    DOB: 1970/02/25, 53 y.o.  FMW:[id number]    Code Status: Full Code   DOA: 09/02/2023   LOS: 1   Brief hospital course  Sabrina Roberson is a 53 y.o. female with hx asthma, bronchiectasis, history of post influenza MRSA pneumonia in 1/25 requiring intubation, recurrent MRSA pneumonia, aspiration risk, history of smoking, TIA, diabetes type 2, hypertension, hyperlipidemia, CKD 3, hypothyroidism, Recently seen PCP with recent history of ground-level fall, noted to have gait instability, and concern for paranoid behavior and ordered for CT Head and referred for psychiatry outpatient. Presents with recent productive cough and fever at home. Admitted with CAP with AHRF.    09/04/23 -patient reports not taking her medications for at least a month due to affordability. Clinically she feels improved today. She was unable to wean from 2L. Still has congested cough.    Assessment & Plan  Principal Problem:   Pneumonia Active Problems:   AMS (altered mental status)  Community-acquired pneumonia History of MRSA pneumonia History of bronchiectasis Acute hypoxic respiratory failure, 2 L O2 Respiratory viral panel negative. MRSA screening positive. She and husband feel she has not fully recovered since original infection in January.  Stable on 2Lnc. No oxygen use at baseline.  CTA chest negative for PE, demonstrates Progressive atelectasis in the right middle lobe and new 1.8 cm focal airspace infiltrate centrally in the left lower lobe. - Continue vancomycin  pharmacy to dose, ceftriaxone  1 g IV every 24 hours, azithromycin  500 mg daily - Albuterol  neb as needed, flutter valve, I-S - Wean O2 as able goal greater than 92%, will need home O2 desat screen prior to discharge - mucinex  and sudafed - attempt to wean to room air. Ambulate with pulse ox   Recent ground-level fall Gait instability noted by PCP Mild encephalopathy  CT head outpatient negative for acute  abnormality, x-ray of the L-spine noting L5 pars defect and 12 mm anterolisthesis L5 over S1. Appears to be at mental baseline inpatient.  -PT/OT evaluation -- MRI brain with and without contrast- negative for abnormalities - continue thiamine    Paranoid behavior -Noted by PCP outpatient, has been referred to psychiatry. Does not appear to be acutely exacerbated   Hypothyroidism: patient endorses non-adherence due to affordability at least a month. TSH 64 on recent labs OP. T4 low.  - continue levothyroxine  at higher dose. Repeat TSH 6-8 weeks.   incidental findings:  On recent L-spine L5 pars defects with 12 mm anterolisthesis of L5 over S1: No motor weakness / sensory loss in LE    Chronic medical problems: History asthma: Without acute exacerbation, see management of pneumonia above. Aspiration risk: Per SLP okay for regular diet thin liquids with whole meds in pure, referred to ENT outpatient History of smoking: denies active   TIA: Not on antiPLT. Continue home atorvastatin   Diabetes type 2: Not requiring SSI can add if runs high  Hypertension: Home propranolol - held for borderline bradycardia Hyperlipidemia: see TIA  CKD 3A: Baseline Cr ~1   Body mass index is 26.78 kg/m.  VTE ppx: enoxaparin  (LOVENOX ) injection 40 mg Start: 09/03/23 1000  Diet:     Diet   Diet regular Room service appropriate? Yes; Fluid consistency: Thin   Consultants: None   Subjective 09/04/23    Pt reports feeling better today. Continues to have wheezing feeling in chest. Just had a breathing treatment which helped.    Objective  Blood pressure 121/74, pulse 60, temperature 97.9 F (  36.6 C), temperature source Oral, resp. rate 15, height 5' 4 (1.626 m), weight 70.8 kg, SpO2 98%.  Intake/Output Summary (Last 24 hours) at 09/04/2023 0743 Last data filed at 09/04/2023 0600 Gross per 24 hour  Intake 911.22 ml  Output --  Net 911.22 ml   Filed Weights   09/02/23 1512  Weight: 70.8 kg     Physical Exam:  General: awake, alert, NAD HEENT: atraumatic, clear conjunctiva, anicteric sclera, MMM, hearing grossly normal Respiratory: normal respiratory effort. Scattered wheezing. Neg rales Cardiovascular: quick capillary refill, normal S1/S2, RRR, no JVD, murmurs Nervous: A&O x3. no gross focal neurologic deficits, normal speech Extremities: moves all equally, no edema, normal tone Skin: dry, intact, normal temperature, normal color. No rashes, lesions or ulcers on exposed skin Psychiatry: normal mood, congruent affect  Labs   I have personally reviewed the following labs and imaging studies CBC    Component Value Date/Time   WBC 5.2 09/04/2023 0514   RBC 3.23 (L) 09/04/2023 0514   HGB 9.3 (L) 09/04/2023 0514   HGB 11.7 07/08/2023 1052   HCT 32.4 (L) 09/04/2023 0514   HCT 38.0 07/08/2023 1052   PLT 238 09/04/2023 0514   PLT 401 07/08/2023 1052   MCV 100.3 (H) 09/04/2023 0514   MCV 92 07/08/2023 1052   MCH 28.8 09/04/2023 0514   MCHC 28.7 (L) 09/04/2023 0514   RDW 14.0 09/04/2023 0514   RDW 14.1 07/08/2023 1052   LYMPHSABS 2.3 07/08/2023 1052   MONOABS 0.4 01/24/2023 0550   EOSABS 0.4 07/08/2023 1052   BASOSABS 0.0 07/08/2023 1052      Latest Ref Rng & Units 09/04/2023    5:14 AM 09/03/2023    2:36 AM 09/02/2023    3:27 PM  BMP  Glucose 70 - 99 mg/dL 80  854  84   BUN 6 - 20 mg/dL 13  10  6    Creatinine 0.44 - 1.00 mg/dL 9.10  9.09  8.90   Sodium 135 - 145 mmol/L 142  137  144   Potassium 3.5 - 5.1 mmol/L 3.9  3.7  3.7   Chloride 98 - 111 mmol/L 102  99  104   CO2 22 - 32 mmol/L 32  28  30   Calcium  8.9 - 10.3 mg/dL 8.5  7.9  9.1     MR BRAIN W WO CONTRAST Result Date: 09/03/2023 CLINICAL DATA:  Abnl cerebellar function, proprioception. Eval for cerebellar lesion EXAM: MRI HEAD WITHOUT AND WITH CONTRAST TECHNIQUE: Multiplanar, multiecho pulse sequences of the brain and surrounding structures were obtained without and with intravenous contrast. CONTRAST:  7mL  GADAVIST  GADOBUTROL  1 MMOL/ML IV SOLN COMPARISON:  CT head 09/01/2023. FINDINGS: Brain: No acute infarction, hemorrhage, hydrocephalus, extra-axial collection or mass lesion. Mild T2/FLAIR hyperintensities in the white matter, which are nonspecific but compatible with chronic microvascular ischemic disease. Vascular: Normal flow voids. Skull and upper cervical spine: Normal marrow signal. Sinuses/Orbits: Negative. IMPRESSION: Normal brain MRI. No acute abnormality. Electronically Signed   By: Gilmore GORMAN Molt M.D.   On: 09/03/2023 13:15   CT Angio Chest PE W and/or Wo Contrast Result Date: 09/02/2023 EXAM: CTA of the Chest with contrast for PE 09/02/2023 05:33:38 PM TECHNIQUE: CTA of the chest was performed after the administration of intravenous contrast. Multiplanar reformatted images are provided for review. MIP images are provided for review. Automated exposure control, iterative reconstruction, and/or weight based adjustment of the mA/kV was utilized to reduce the radiation dose to as low as reasonably achievable. COMPARISON:  05/16/2023 CLINICAL HISTORY: Pulmonary embolism (PE) suspected, low to intermediate prob, positive D-dimer; cough, SOB, hypoxia. Triage note: Pt POV reporting chest pain, congestion, and SOB x1 week. Recurrent issues with pneumonia r/t hospitalization in Jan, was intubated. Called pulmonologist and was advised to come to [facility] for chest xray due to no appts available. FINDINGS: PULMONARY ARTERIES: Pulmonary arteries are adequately opacified for evaluation. No pulmonary embolism. Main pulmonary artery is normal in caliber. MEDIASTINUM: The heart and pericardium demonstrate no acute abnormality. Scattered calcified plaque in the descending thoracic and visualized abdominal aorta. LYMPH NODES: No mediastinal, hilar or axillary lymphadenopathy. LUNGS AND PLEURA: Progressive atelectasis in the right middle lobe. New 1.8 cm focal airspace infiltrate centrally in the left lower lobe (series 7,  image 63). Resolving subpleural nodules previously noted posteriorly at the left lung base. No pleural effusion or pneumothorax. UPPER ABDOMEN: Cholecystectomy clips. SOFT TISSUES AND BONES: Spondylitic changes in the visualized lower cervical spine. No acute bone or soft tissue abnormality. IMPRESSION: 1. No pulmonary embolism. 2. Progressive atelectasis in the right middle lobe and new 1.8 cm focal airspace infiltrate centrally in the left lower lobe. Electronically signed by: Katheleen Faes MD 09/02/2023 06:28 PM EDT RP Workstation: [id number]   DG Chest 2 View Result Date: 09/02/2023 CLINICAL DATA:  Chest pain. EXAM: DG CHEST 2V COMPARISON:  Jun 27, 2023 FINDINGS: The heart size and mediastinal contours are within normal limits. Low lung volumes are noted with mild, diffuse, chronic appearing increased interstitial lung markings. Mild left upper lobe linear scarring and/or atelectasis is seen within the left upper lobe. No pleural effusion or pneumothorax is identified. Radiopaque surgical clips are seen within the right upper quadrant. The visualized skeletal structures are unremarkable. IMPRESSION: Low lung volumes with mild left upper lobe linear scarring and/or atelectasis. Electronically Signed   By: Suzen Dials M.D.   On: 09/02/2023 16:12   DG Hip Unilat W OR W/O Pelvis 1V Right Result Date: 09/02/2023 CLINICAL DATA:  Fall, right hip tenderness EXAM: DG HIP (WITH OR WITHOUT PELVIS) 1V RIGHT COMPARISON:  None Available. FINDINGS: Normal alignment. No acute fracture or dislocation. Joint spaces are preserved. Soft tissues are unremarkable IMPRESSION: 1. Negative. Electronically Signed   By: Dorethia Molt M.D.   On: 09/02/2023 15:38   DG Lumbar Spine Complete Result Date: 09/02/2023 CLINICAL DATA:  Back pain. EXAM: LUMBAR SPINE - COMPLETE 4+ VIEW COMPARISON:  Abdominal radiograph dated 01/29/2023. FINDINGS: Five lumbar type vertebra. There is no acute fracture or subluxation of the lumbar spine.  There is L5 pars defects with 12 mm anterolisthesis of L5 over S1. There is degenerative changes at L5-S1 with spurring and osteophyte. The bones are osteopenic. Atherosclerotic calcification of the abdominal aorta. Right upper quadrant cholecystectomy clips. IMPRESSION: 1. No acute fracture or subluxation of the lumbar spine. 2. L5 pars defects with 12 mm anterolisthesis of L5 over S1. Electronically Signed   By: Vanetta Chou M.D.   On: 09/02/2023 15:36   Disposition Plan & Communication  Patient status: Inpatient  Admitted From: Home Planned disposition location: TBD Anticipated discharge date: TBD pending clinical workup   Family Communication: husband at bedside     Author: Marien LITTIE Piety, DO [facility] 09/04/2023, 7:43 AM   Available by Epic secure chat 7AM-7PM. If 7PM-7AM, please contact night-coverage.  [facility] contact information found on [email or web address].

## 2023-09-04 NOTE — Progress Notes (Signed)
 PHYSICAL THERAPY    SATURATION QUALIFICATIONS: (This note is used to comply with regulatory documentation for home oxygen)  Patient Saturations on Room Air at Rest = 95%  Patient Saturations on Room Air while Ambulating 120 feet = 89 - 90% dyspnea 1/4  Pt does NOT require supplemental oxygen.  Educated on Deep Breathing esp with activity followed by coughing  Katheryn Leap  PTA Acute  Rehabilitation Services Office M-F          [phone]

## 2023-09-04 NOTE — Progress Notes (Signed)
 Physical Therapy Treatment Patient Details Name: Sabrina Roberson MRN: [id number] DOB: Sep 05, 1970 Today's Date: 09/04/2023   History of Present Illness Sabrina Roberson is a 53 y.o. female who presents to [facility]  09/02/23 with SOB, hypoxia, fever, recent fall, gait instability, Left lower lobe pneumonia.PMH: asthma, bronchiectasis, history of post influenza MRSA pneumonia in 1/' 25 requiring intubation, recurrent MRSA pneumonia, aspiration risk, history of smoking, TIA, diabetes type 2, hypertension, hyperlipidemia, CKD 3, hypothyroidism.    PT Comments  Pt AxO x 3 pleasant and feeling much better.  Spouse present.  Pt in bed on 2 lts sats 100%. Assisted OOB to amb and wean oxygen went well. Pt self able to rise OOB.  General Gait Details: amb with Spouse hands on with light steadiness using a safety belt.  Tolerated a functional distance 115 feet on RA avg 89 - 90% with 1/4 dyspnea and VC's on deep breathing and coughing.  Pt was able to expell a small amount of phlem during session. Pt plans to return home.  LPT has rec OP PT and NO equipment.     If plan is discharge home, recommend the following: A little help with walking and/or transfers;A little help with bathing/dressing/bathroom;Assistance with cooking/housework;Help with stairs or ramp for entrance   Can travel by private vehicle        Equipment Recommendations  None recommended by PT    Recommendations for Other Services       Precautions / Restrictions Precautions Precautions: Fall Precaution/Restrictions Comments: monitor stats Restrictions Weight Bearing Restrictions Per Provider Order: No     Mobility  Bed Mobility Overal bed mobility: Modified Independent             General bed mobility comments: self able    Transfers Overall transfer level: Needs assistance Equipment used: 1 person hand held assist Transfers: Sit to/from Stand Sit to Stand: Supervision           General transfer comment: self  able to rise from beed with Spouse hands on contact guard assist    Ambulation/Gait Ambulation/Gait assistance: Supervision, Contact guard assist Gait Distance (Feet): 115 Feet Assistive device: 1 person hand held assist Gait Pattern/deviations: Step-through pattern Gait velocity: decreased     General Gait Details: amb with Spouse hands on with light steadiness using a safety belt.  Tolerated a functional distance 115 feet on RA avg 89 - 90% with 1/4 dyspnea and VC's on deep breathing and coughing.  Pt was able to expell a small amount of phlem during session.   Stairs             Wheelchair Mobility     Tilt Bed    Modified Rankin (Stroke Patients Only)       Balance                                            Communication Communication Communication: No apparent difficulties  Cognition Arousal: Alert Behavior During Therapy: WFL for tasks assessed/performed   PT - Cognitive impairments: No apparent impairments                       PT - Cognition Comments: AxO x 3 feeling much better Following commands: Intact      Cueing Cueing Techniques: Verbal cues  Exercises      General Comments  Pertinent Vitals/Pain Pain Assessment Pain Assessment: No/denies pain    Home Living                          Prior Function            PT Goals (current goals can now be found in the care plan section) Progress towards PT goals: Progressing toward goals    Frequency    Min 2X/week      PT Plan      Co-evaluation              AM-PAC PT 6 Clicks Mobility   Outcome Measure  Help needed turning from your back to your side while in a flat bed without using bedrails?: None Help needed moving from lying on your back to sitting on the side of a flat bed without using bedrails?: None Help needed moving to and from a bed to a chair (including a wheelchair)?: None Help needed standing up from a  chair using your arms (e.g., wheelchair or bedside chair)?: None Help needed to walk in hospital room?: None Help needed climbing 3-5 steps with a railing? : None 6 Click Score: 24    End of Session Equipment Utilized During Treatment: Gait belt Activity Tolerance: Patient tolerated treatment well Patient left: in bed;with call bell/phone within reach;with bed alarm set;with family/visitor present Nurse Communication: Mobility status PT Visit Diagnosis: Unsteadiness on feet (R26.81);Difficulty in walking, not elsewhere classified (R26.2);Other symptoms and signs involving the nervous system (R29.898);Repeated falls (R29.6);Other abnormalities of gait and mobility (R26.89)     Time: 8845-8781 PT Time Calculation (min) (ACUTE ONLY): 24 min  Charges:    $Gait Training: 8-22 mins $Therapeutic Activity: 8-22 mins PT General Charges $$ ACUTE PT VISIT: 1 Visit                     Katheryn Leap  PTA Acute  Rehabilitation Services Office M-F          [phone]

## 2023-09-04 NOTE — Plan of Care (Signed)

## 2023-09-04 NOTE — Progress Notes (Signed)
 Pharmacy Antibiotic Note  Sabrina Roberson is a 53 y.o. female admitted on 09/02/2023 with pneumonia.  Pharmacy has been consulted for Vanco dosing.  ID: CAP, hx MRSA PNA  Afebrile, WBC WNL, Scr <1  Antimicrobials this admission: 8/5 azith  >> ( 8/8) 8/5 CTX >> ( 8/11)  8/6 vanc >>   Dose adjustments this admission:   Microbiology results: 8/5 BCx: NGTD  8/5 Sputum:  neg 8/6 resp panel: negative  8/6 MRSA PCR : +  Plan: Increase Vancomycin  1500 mg IV Q 24 hrs. Goal AUC 400-550. Expected AUC: 507 SCr used: 0.89     Height: 5' 4 (162.6 cm) Weight: 70.8 kg (156 lb) IBW/kg (Calculated) : 54.7  Temp (24hrs), Avg:98 F (36.7 C), Min:97.8 F (36.6 C), Max:98.1 F (36.7 C)  Recent Labs  Lab 09/02/23 1527 09/02/23 1602 09/03/23 0236 09/04/23 0514  WBC 4.8  --  5.0 5.2  CREATININE 1.09*  --  0.90 0.89  LATICACIDVEN  --  <0.3*  --   --     Estimated Creatinine Clearance: 72.1 mL/min (by C-G formula based on SCr of 0.89 mg/dL).    Allergies  Allergen Reactions   Latex Anaphylaxis and Swelling   Codeine Nausea And Vomiting    GI Upset (intolerance), Vomiting (intolerance)   Ibuprofen Other (See Comments)    GI upset, burning sensation   Oxycodone  Nausea And Vomiting    Pre-medication with phenergan  needed.   Tape Itching and Rash    Please use paper tape only.    Shaquella Stamant Karoline Marina, PharmD, BCPS Clinical Staff Pharmacist  [facility] 09/04/2023 7:58 AM

## 2023-09-04 NOTE — Progress Notes (Signed)
 Hi Laykin,  Your lumbar xray an area to your L5 where there is mild slipping of the disc. This has been present for some time as it was on your CT in December 2024. This can cause chronic back pain. If you would like to discuss options for treatment with Orthopedics, I would be happy to place a referral.

## 2023-09-05 DIAGNOSIS — J9601 Acute respiratory failure with hypoxia: Secondary | ICD-10-CM | POA: Diagnosis not present

## 2023-09-05 LAB — BASIC METABOLIC PANEL WITH GFR
Anion gap: 9 (ref 5–15)
BUN: 8 mg/dL (ref 6–20)
CO2: 32 mmol/L (ref 22–32)
Calcium: 8.3 mg/dL — ABNORMAL LOW (ref 8.9–10.3)
Chloride: 101 mmol/L (ref 98–111)
Creatinine, Ser: 0.89 mg/dL (ref 0.44–1.00)
GFR, Estimated: >60 mL/min (ref >60)
Glucose, Bld: 90 mg/dL (ref 70–99)
Potassium: 3.4 mmol/L — ABNORMAL LOW (ref 3.5–5.1)
Sodium: 142 mmol/L (ref 135–145)

## 2023-09-05 LAB — CBC
HCT: 33.1 % — ABNORMAL LOW (ref 36.0–46.0)
Hemoglobin: 9.4 g/dL — ABNORMAL LOW (ref 12.0–15.0)
MCH: 28.5 pg (ref 26.0–34.0)
MCHC: 28.4 g/dL — ABNORMAL LOW (ref 30.0–36.0)
MCV: 100.3 fL — ABNORMAL HIGH (ref 80.0–100.0)
Platelets: 272 K/uL (ref 150–400)
RBC: 3.3 MIL/uL — ABNORMAL LOW (ref 3.87–5.11)
RDW: 14.2 % (ref 11.5–15.5)
WBC: 5.3 K/uL (ref 4.0–10.5)
nRBC: 0 % (ref 0.0–0.2)

## 2023-09-05 LAB — VITAMIN B1: Vitamin B1 (Thiamine): 89.6 nmol/L (ref 66.5–200.0)

## 2023-09-05 MED ORDER — POTASSIUM CHLORIDE 20 MEQ PO PACK
40.0000 meq | PACK | ORAL | Status: AC
  Administered 2023-09-05 (×2): 40 meq via ORAL
  Filled 2023-09-05 (×2): qty 2

## 2023-09-05 MED ORDER — METHYLPREDNISOLONE SODIUM SUCC 40 MG IJ SOLR
40.0000 mg | Freq: Two times a day (BID) | INTRAMUSCULAR | Status: DC
  Administered 2023-09-05 – 2023-09-08 (×6): 40 mg via INTRAVENOUS
  Filled 2023-09-05 (×6): qty 1

## 2023-09-05 MED ORDER — BUDESONIDE 0.25 MG/2ML IN SUSP
0.2500 mg | Freq: Two times a day (BID) | RESPIRATORY_TRACT | Status: DC
  Administered 2023-09-05 – 2023-09-08 (×7): 0.25 mg via RESPIRATORY_TRACT
  Filled 2023-09-05 (×6): qty 2

## 2023-09-05 NOTE — Progress Notes (Addendum)
 PROGRESS NOTE  Sabrina Roberson    DOB: 06-21-1970, 53 y.o.  FMW:[id number]    Code Status: Full Code   DOA: 09/02/2023   LOS: 2   Brief hospital course  Patient is a 53 year old female with past medical history significant for asthma, bronchiectasis, history of post influenza MRSA pneumonia in 1/25 requiring intubation, recurrent MRSA pneumonia, aspiration risk, history of smoking, TIA, diabetes type 2, hypertension, hyperlipidemia, CKD 3, and hypothyroidism.  Patient has over 30 pack years.  Patient quit cigarette smoking after recent hospital admission.  Patient was recently seen by her PCP with recent history of ground-level fall, noted to have gait instability, and concern for paranoid behavior and ordered for CT Head and referred for psychiatry outpatient.  Patient presented with productive cough and fever at home.  Patient was significantly hypoxic prior to presentation.  Apparently, due to financial reasons, patient was not compliant with her medications for about a month.  Patient is known to the local pulmonary team.  Patient was admitted with CAP with AHRF.    09/05/23 -patient seen alongside patient's husband, patient's mother and father.  Patient continues to require supplemental oxygen.  Expiratory wheezes noted.  Patient continues to report productive cough, yellowish phlegm . Assessment & Plan  Principal Problem:   Pneumonia Active Problems:   AMS (altered mental status)  Community-acquired pneumonia History of MRSA pneumonia History of bronchiectasis Acute hypoxic respiratory failure, 2 L O2 Respiratory viral panel negative. MRSA screening positive. She and husband feel she has not fully recovered since original infection in January.  Stable on 2Lnc. No oxygen use at baseline.  CTA chest negative for PE, demonstrates Progressive atelectasis in the right middle lobe and new 1.8 cm focal airspace infiltrate centrally in the left lower lobe. - Continue vancomycin  pharmacy to dose,  ceftriaxone  1 g IV every 24 hours, azithromycin  500 mg daily - Albuterol  neb as needed, flutter valve, I-S - Wean O2 as able goal greater than 92%, will need home O2 desat screen prior to discharge - mucinex  and sudafed - attempt to wean to room air. Ambulate with pulse ox 09/05/2023: See above documentation.  Continue antibiotics.  Start patient on IV Solu-Medrol .  Stop nebs Pulmicort .   Recent ground-level fall Gait instability noted by PCP Mild encephalopathy  CT head outpatient negative for acute abnormality, x-ray of the L-spine noting L5 pars defect and 12 mm anterolisthesis L5 over S1. Appears to be at mental baseline inpatient.  -PT/OT evaluation -- MRI brain with and without contrast- negative for abnormalities - continue thiamine    Paranoid behavior -Noted by PCP outpatient, has been referred to psychiatry. Does not appear to be acutely exacerbated   Hypothyroidism: patient endorses non-adherence due to affordability at least a month. TSH 64 on recent labs OP. T4 low.  - continue levothyroxine  at higher dose. Repeat TSH 6-8 weeks.  Hypokalemia: - Potassium of 3.4. - KCl 40 mEq Q4 hourly x 2 doses. - Renal panel and magnesium  level in the morning.   incidental findings:  On recent L-spine L5 pars defects with 12 mm anterolisthesis of L5 over S1: No motor weakness / sensory loss in LE    Chronic medical problems: History asthma: Without acute exacerbation, see management of pneumonia above. Aspiration risk: Per SLP okay for regular diet thin liquids with whole meds in pure, referred to ENT outpatient History of smoking: denies active   TIA: Not on antiPLT. Continue home atorvastatin   Diabetes type 2: Not requiring SSI can add if  runs high  Hypertension: Home propranolol - held for borderline bradycardia Hyperlipidemia: see TIA  CKD 3A: Baseline Cr ~1   Body mass index is 26.78 kg/m.  VTE ppx: enoxaparin  (LOVENOX ) injection 40 mg Start: 09/03/23 1000  Diet:     Diet    Diet regular Room service appropriate? Yes; Fluid consistency: Thin   Consultants: None   Subjective 09/05/23    Patient continues to need supplemental oxygen. Productive cough, yellowish.   Objective    Intake/Output Summary (Last 24 hours) at 09/05/2023 1556 Last data filed at 09/05/2023 1523 Gross per 24 hour  Intake 1010.43 ml  Output 1400 ml  Net -389.57 ml   Filed Weights   09/02/23 1512  Weight: 70.8 kg    Physical Exam:  General: Not in any distress.  Awake and alert.   HEENT: Patient is pale.  No jaundice.   Respiratory: Decreased air entry with expiratory wheeze.   Cardiovascular: S1-S2. Nervous: Awake and alert.  Labs   I have personally reviewed the following labs and imaging studies CBC    Component Value Date/Time   WBC 5.3 09/05/2023 0433   RBC 3.30 (L) 09/05/2023 0433   HGB 9.4 (L) 09/05/2023 0433   HGB 11.7 07/08/2023 1052   HCT 33.1 (L) 09/05/2023 0433   HCT 38.0 07/08/2023 1052   PLT 272 09/05/2023 0433   PLT 401 07/08/2023 1052   MCV 100.3 (H) 09/05/2023 0433   MCV 92 07/08/2023 1052   MCH 28.5 09/05/2023 0433   MCHC 28.4 (L) 09/05/2023 0433   RDW 14.2 09/05/2023 0433   RDW 14.1 07/08/2023 1052   LYMPHSABS 2.3 07/08/2023 1052   MONOABS 0.4 01/24/2023 0550   EOSABS 0.4 07/08/2023 1052   BASOSABS 0.0 07/08/2023 1052      Latest Ref Rng & Units 09/05/2023    4:33 AM 09/04/2023    5:14 AM 09/03/2023    2:36 AM  BMP  Glucose 70 - 99 mg/dL 90  80  854   BUN 6 - 20 mg/dL 8  13  10    Creatinine 0.44 - 1.00 mg/dL 9.10  9.10  9.09   Sodium 135 - 145 mmol/L 142  142  137   Potassium 3.5 - 5.1 mmol/L 3.4  3.9  3.7   Chloride 98 - 111 mmol/L 101  102  99   CO2 22 - 32 mmol/L 32  32  28   Calcium  8.9 - 10.3 mg/dL 8.3  8.5  7.9     No results found.  Disposition Plan & Communication  Patient status: Inpatient  Admitted From: Home Planned disposition location: TBD Anticipated discharge date: TBD pending clinical workup   Family  Communication: husband at bedside     Author: Leatrice LILLETTE Chapel, MD. [facility] 09/05/2023, 3:56 PM   Available by Epic secure chat 7AM-7PM. If 7PM-7AM, please contact night-coverage.  [facility] contact information found on [email or web address].

## 2023-09-05 NOTE — TOC Initial Note (Signed)
 Transition of Care [facility]) - Initial/Assessment Note   Patient Details  Name: Sabrina Roberson MRN: [id number] Date of Birth: 28-Jun-1970  Transition of Care [facility]) CM/SW Contact:    Duwaine GORMAN Aran, LCSW Phone Number: 09/05/2023, 2:19 PM  Clinical Narrative: Care management consulted for medication assistance. PT evaluation recommended OPPT. CSW met with patient and spouse, Sabrina Roberson, to discuss consult and PT recommendations. Patient declined OPPT referral at this time as she is anticipating upcoming dental surgery and prefers to focus on that at this time. CSW explained care management cannot assist with medications as patient has insurance. CSW asked patient if she has followed up with her physicians regarding generic medications. Patient and spouse reported the patient is on some generic medications, but her inhalers are still expensive. Patient is currently on 2L/min oxygen. Care management following for discharge needs.  Expected Discharge Plan: Home/Self Care Barriers to Discharge: Continued Medical Work up  Patient Goals and CMS Choice Patient states their goals for this hospitalization and ongoing recovery are:: Return home  Expected Discharge Plan and Services In-house Referral: Clinical Social Work Living arrangements for the past 2 months: Single Family Home  Prior Living Arrangements/Services Living arrangements for the past 2 months: Single Family Home Lives with:: Spouse Patient language and need for interpreter reviewed:: Yes Do you feel safe going back to the place where you live?: Yes      Need for Family Participation in Patient Care: No (Comment) Care giver support system in place?: Yes (comment) Criminal Activity/Legal Involvement Pertinent to Current Situation/Hospitalization: No - Comment as needed  Activities of Daily Living ADL Screening (condition at time of admission) Independently performs ADLs?: Yes (appropriate for developmental age) Is the patient deaf or  have difficulty hearing?: No Does the patient have difficulty seeing, even when wearing glasses/contacts?: No Does the patient have difficulty concentrating, remembering, or making decisions?: Yes  Emotional Assessment Appearance:: Appears stated age Attitude/Demeanor/Rapport: Engaged Affect (typically observed): Appropriate Orientation: : Oriented to Self, Oriented to Place, Oriented to  Time, Oriented to Situation Alcohol / Substance Use: Not Applicable Psych Involvement: No (comment)  Admission diagnosis:  Wheezing [R06.2] Pneumonia [J18.9] Community acquired pneumonia of left lower lobe of lung [J18.9] Acute hypoxic respiratory failure (HCC) [J96.01] AMS (altered mental status) [R41.82] Patient Active Problem List   Diagnosis Date Noted   AMS (altered mental status) 09/03/2023   Chronic cough 09/02/2023   Dysdiadochokinesia 09/02/2023   Pneumonia 09/02/2023   Severe episode of recurrent major depressive disorder, with psychotic features (HCC) 07/08/2023   Paranoid behavior (HCC) 07/08/2023   Lymphadenopathy, axillary 07/08/2023   Recurrent pneumonia 06/27/2023   Bronchiectasis without complication (HCC) 06/10/2023   Localized swelling of both lower legs 05/29/2023   Pneumonia due to methicillin resistant Staphylococcus aureus (MRSA) (HCC) 04/03/2023   Physical deconditioning 02/17/2023   Bilateral lower extremity edema 12/03/2022   Bilateral carotid bruits 11/04/2022   CKD (chronic kidney disease) stage 2, GFR 60-89 ml/min 11/04/2022   Mixed hyperlipidemia 10/10/2022   GAD (generalized anxiety disorder) 10/10/2022   Acquired hypothyroidism 10/10/2022   Hypertension    Type 2 diabetes mellitus with diabetic chronic kidney disease (HCC)    Primary insomnia 09/18/2022   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 [place] [address] Phone: [phone] Fax: [phone]  [facility] [id number] - [place], [place] - [address] [place] [address] Phone: [phone] Fax: [phone]  Social Drivers of Health (SDOH) Social History: SDOH Screenings   Food Insecurity: No Food Insecurity (09/02/2023)  Housing: Low Risk  (09/02/2023)  Transportation Needs: No Transportation Needs (09/02/2023)  Utilities: Not At Risk (09/02/2023)  Alcohol Screen: Low Risk  (10/10/2022)  Depression (PHQ2-9): High Risk (09/01/2023)  Financial Resource Strain: Low Risk  (03/02/2023)  Physical Activity: Inactive (03/02/2023)  Social Connections: Moderately Isolated (03/04/2023)  Stress: Stress Concern Present (03/02/2023)  Tobacco Use: Medium Risk (09/01/2023)  Health Literacy: Adequate Health Literacy (10/10/2022)   SDOH Interventions:    Readmission Risk Interventions    09/05/2023    2:12 PM 01/22/2023    8:55 AM  Readmission Risk Prevention Plan  Transportation Screening Complete Complete  HRI or Home Care Consult Complete Complete  Social Work Consult for Recovery Care Planning/Counseling Complete Complete  Palliative Care Screening Not Applicable   Medication Review Oceanographer) Complete Complete

## 2023-09-05 NOTE — Plan of Care (Signed)
   Problem: Clinical Measurements: Goal: Respiratory complications will improve Outcome: Progressing   Problem: Activity: Goal: Risk for activity intolerance will decrease Outcome: Progressing   Problem: Elimination: Goal: Will not experience complications related to bowel motility Outcome: Progressing

## 2023-09-05 NOTE — Progress Notes (Signed)
 Physical Therapy Treatment Patient Details Name: Sabrina Roberson MRN: [id number] DOB: [birth year] Today's Date: 09/05/2023   History of Present Illness Sabrina Roberson is a 53 y.o. female who presents to [facility]  09/02/23 with SOB, hypoxia, fever, recent fall, gait instability, Left lower lobe pneumonia.PMH: asthma, bronchiectasis, history of post influenza MRSA pneumonia in 1/' 25 requiring intubation, recurrent MRSA pneumonia, aspiration risk, history of smoking, TIA, diabetes type 2, hypertension, hyperlipidemia, CKD 3, hypothyroidism.    PT Comments  Pt ambulated in hallway and SpO2 monitored on room air.  SPO2 93% on room air at rest.  SpO2 89-95% during ambulation and pt requested return to 2L O2 [place].  SPO2 93% on 2L end of session.    If plan is discharge home, recommend the following: A little help with walking and/or transfers;A little help with bathing/dressing/bathroom;Assistance with cooking/housework;Help with stairs or ramp for entrance   Can travel by private vehicle        Equipment Recommendations  None recommended by PT    Recommendations for Other Services       Precautions / Restrictions Precautions Precautions: Fall Precaution/Restrictions Comments: monitor sats     Mobility  Bed Mobility Overal bed mobility: Modified Independent                  Transfers Overall transfer level: Needs assistance Equipment used: None Transfers: Sit to/from Stand Sit to Stand: Supervision                Ambulation/Gait Ambulation/Gait assistance: Supervision Gait Distance (Feet): 340 Feet Assistive device: None Gait Pattern/deviations: Step-through pattern, Decreased stride length Gait velocity: decreased     General Gait Details: monitored SPO2 during ambulation, 89-95% on room air   Stairs             Wheelchair Mobility     Tilt Bed    Modified Rankin (Stroke Patients Only)       Balance                                             Communication Communication Communication: No apparent difficulties  Cognition Arousal: Alert Behavior During Therapy: WFL for tasks assessed/performed   PT - Cognitive impairments: No apparent impairments                         Following commands: Intact      Cueing    Exercises      General Comments        Pertinent Vitals/Pain Pain Assessment Pain Assessment: No/denies pain    Home Living                          Prior Function            PT Goals (current goals can now be found in the care plan section) Progress towards PT goals: Progressing toward goals    Frequency    Min 2X/week      PT Plan      Co-evaluation              AM-PAC PT 6 Clicks Mobility   Outcome Measure  Help needed turning from your back to your side while in a flat bed without using bedrails?: None Help needed moving from lying on your back to sitting on  the side of a flat bed without using bedrails?: None Help needed moving to and from a bed to a chair (including a wheelchair)?: None Help needed standing up from a chair using your arms (e.g., wheelchair or bedside chair)?: None Help needed to walk in hospital room?: None Help needed climbing 3-5 steps with a railing? : A Little 6 Click Score: 23    End of Session Equipment Utilized During Treatment: Gait belt Activity Tolerance: Patient tolerated treatment well Patient left: with call bell/phone within reach;in bed;with bed alarm set   PT Visit Diagnosis: Difficulty in walking, not elsewhere classified (R26.2)     Time: 8849-8796 PT Time Calculation (min) (ACUTE ONLY): 13 min  Charges:    $Gait Training: 8-22 mins PT General Charges $$ ACUTE PT VISIT: 1 Visit                     Tari KLEIN, DPT Physical Therapist Acute Rehabilitation Services Office: [phone]  Sabrina Roberson 09/05/2023, 2:01 PM

## 2023-09-05 NOTE — Progress Notes (Signed)
 I responded to a spiritual care consult to assist Sabrina Roberson with HCPOA paperwork.  She wanted more information on the process, but did not want to discuss it right now.  I brought her paperwork and let her know of our ongoing availability to answer questions if she would like.

## 2023-09-06 DIAGNOSIS — J441 Chronic obstructive pulmonary disease with (acute) exacerbation: Secondary | ICD-10-CM | POA: Diagnosis not present

## 2023-09-06 LAB — RENAL FUNCTION PANEL
Albumin: 3 g/dL — ABNORMAL LOW (ref 3.5–5.0)
Anion gap: 13 (ref 5–15)
BUN: 8 mg/dL (ref 6–20)
CO2: 28 mmol/L (ref 22–32)
Calcium: 9.1 mg/dL (ref 8.9–10.3)
Chloride: 101 mmol/L (ref 98–111)
Creatinine, Ser: 0.77 mg/dL (ref 0.44–1.00)
GFR, Estimated: >60 mL/min (ref >60)
Glucose, Bld: 130 mg/dL — ABNORMAL HIGH (ref 70–99)
Phosphorus: 3.1 mg/dL (ref 2.5–4.6)
Potassium: 4.4 mmol/L (ref 3.5–5.1)
Sodium: 142 mmol/L (ref 135–145)

## 2023-09-06 LAB — CULTURE, RESPIRATORY W GRAM STAIN

## 2023-09-06 LAB — MAGNESIUM: Magnesium: 2.2 mg/dL (ref 1.7–2.4)

## 2023-09-06 NOTE — Plan of Care (Signed)

## 2023-09-06 NOTE — Progress Notes (Signed)
 PROGRESS NOTE  Sabrina Roberson    DOB: 1970/12/10, 53 y.o.  FMW:[id number]    Code Status: Full Code   DOA: 09/02/2023   LOS: 3   Brief hospital course  Patient is a 53 year old female with past medical history significant for asthma, bronchiectasis, history of post influenza MRSA pneumonia in 1/25 requiring intubation, recurrent MRSA pneumonia, aspiration risk, history of smoking, TIA, diabetes type 2, hypertension, hyperlipidemia, CKD 3, and hypothyroidism.  Patient has over 30 pack years.  Patient quit cigarette smoking after recent hospital admission.  Patient was recently seen by her PCP with recent history of ground-level fall, noted to have gait instability, and concern for paranoid behavior and ordered for CT Head and referred for psychiatry outpatient.  Patient presented with productive cough and fever at home.  Patient was significantly hypoxic prior to presentation.  Apparently, due to financial reasons, patient was not compliant with her medications for about a month.  Patient is known to the local pulmonary team.  Patient was admitted with CAP with AHRF.    09/06/23 -patient seen alongside patient's husband.  Patient is slowly improving.  Will continue current management.  Likely DC home in next 24 to 48 hours.  Will assess need for home oxygen prior to discharge.. Assessment & Plan  Principal Problem:   Pneumonia Active Problems:   AMS (altered mental status)  Community-acquired pneumonia History of MRSA pneumonia History of bronchiectasis Acute hypoxic respiratory failure, 2 L O2 Respiratory viral panel negative. MRSA screening positive. She and husband feel she has not fully recovered since original infection in January.  Stable on 2Lnc. No oxygen use at baseline.  CTA chest negative for PE, demonstrates Progressive atelectasis in the right middle lobe and new 1.8 cm focal airspace infiltrate centrally in the left lower lobe. - Continue vancomycin  pharmacy to dose, ceftriaxone  1  g IV every 24 hours, azithromycin  500 mg daily - Albuterol  neb as needed, flutter valve, I-S - Wean O2 as able goal greater than 92%, will need home O2 desat screen prior to discharge - mucinex  and sudafed - attempt to wean to room air. Ambulate with pulse ox 09/05/2023: See above documentation.  Continue antibiotics.  Start patient on IV Solu-Medrol .  Stop nebs Pulmicort . 09/06/2023: Patient has continued to improve.  Continue current regimen.  Assess need for home oxygen prior to discharge.   Recent ground-level fall Gait instability noted by PCP Mild encephalopathy  CT head outpatient negative for acute abnormality, x-ray of the L-spine noting L5 pars defect and 12 mm anterolisthesis L5 over S1. Appears to be at mental baseline inpatient.  -PT/OT evaluation.  PT/OT on discharge. -- MRI brain with and without contrast- negative for abnormalities - continue thiamine    Paranoid behavior -Noted by PCP outpatient, has been referred to psychiatry. Does not appear to be acutely exacerbated 09/06/2023: Stable.   Hypothyroidism: patient endorses non-adherence due to affordability at least a month. TSH 64 on recent labs OP. T4 low.  - continue levothyroxine  at higher dose. Repeat TSH 6-8 weeks.  Hypokalemia: - Resolved.  Potassium of 4.4 today.    incidental findings:  On recent L-spine L5 pars defects with 12 mm anterolisthesis of L5 over S1: No motor weakness / sensory loss in LE    Chronic medical problems: History asthma: Without acute exacerbation, see management of pneumonia above. Aspiration risk: Per SLP okay for regular diet thin liquids with whole meds in pure, referred to ENT outpatient History of smoking: denies active   TIA:  Not on antiPLT. Continue home atorvastatin   Diabetes type 2: Not requiring SSI can add if runs high  Hypertension: Home propranolol - held for borderline bradycardia Hyperlipidemia: see TIA  CKD 3A versus acute kidney injury: Baseline Cr ~1.  Serum creatinine  today 0.77.  Body mass index is 26.78 kg/m.  VTE ppx: enoxaparin  (LOVENOX ) injection 40 mg Start: 09/03/23 1000  Diet:     Diet   Diet regular Room service appropriate? Yes; Fluid consistency: Thin   Consultants: None   Subjective 09/06/23    Patient continues to need supplemental oxygen. Productive cough, yellowish.   Objective    Intake/Output Summary (Last 24 hours) at 09/06/2023 1907 Last data filed at 09/06/2023 1700 Gross per 24 hour  Intake 600 ml  Output --  Net 600 ml   Filed Weights   09/02/23 1512  Weight: 70.8 kg    Physical Exam:  General: Not in any distress.  Awake and alert.   HEENT: Patient is pale.  No jaundice.   Respiratory: Air entry is improving.  No significant wheezes.   Cardiovascular: S1-S2. Nervous: Awake and alert.  Labs   I have personally reviewed the following labs and imaging studies CBC    Component Value Date/Time   WBC 5.3 09/05/2023 0433   RBC 3.30 (L) 09/05/2023 0433   HGB 9.4 (L) 09/05/2023 0433   HGB 11.7 07/08/2023 1052   HCT 33.1 (L) 09/05/2023 0433   HCT 38.0 07/08/2023 1052   PLT 272 09/05/2023 0433   PLT 401 07/08/2023 1052   MCV 100.3 (H) 09/05/2023 0433   MCV 92 07/08/2023 1052   MCH 28.5 09/05/2023 0433   MCHC 28.4 (L) 09/05/2023 0433   RDW 14.2 09/05/2023 0433   RDW 14.1 07/08/2023 1052   LYMPHSABS 2.3 07/08/2023 1052   MONOABS 0.4 01/24/2023 0550   EOSABS 0.4 07/08/2023 1052   BASOSABS 0.0 07/08/2023 1052      Latest Ref Rng & Units 09/06/2023    6:33 AM 09/05/2023    4:33 AM 09/04/2023    5:14 AM  BMP  Glucose 70 - 99 mg/dL 869  90  80   BUN 6 - 20 mg/dL 8  8  13    Creatinine 0.44 - 1.00 mg/dL 9.22  9.10  9.10   Sodium 135 - 145 mmol/L 142  142  142   Potassium 3.5 - 5.1 mmol/L 4.4  3.4  3.9   Chloride 98 - 111 mmol/L 101  101  102   CO2 22 - 32 mmol/L 28  32  32   Calcium  8.9 - 10.3 mg/dL 9.1  8.3  8.5     No results found.  Disposition Plan & Communication  Patient status: Inpatient   Admitted From: Home Planned disposition location: TBD Anticipated discharge date: TBD pending clinical workup   Family Communication: husband at bedside     Author: Leatrice LILLETTE Chapel, MD. [facility] 09/06/2023, 7:07 PM   Available by Epic secure chat 7AM-7PM. If 7PM-7AM, please contact night-coverage.  [facility] contact information found on [email or web address].

## 2023-09-06 NOTE — Plan of Care (Signed)
  Problem: Education: Goal: Knowledge of General Education information will improve Description: Including pain rating scale, medication(s)/side effects and non-pharmacologic comfort measures Outcome: Progressing   Problem: Clinical Measurements: Goal: Ability to maintain clinical measurements within normal limits will improve Outcome: Progressing   Problem: Clinical Measurements: Goal: Will remain free from infection Outcome: Progressing   Problem: Clinical Measurements: Goal: Cardiovascular complication will be avoided Outcome: Progressing   Problem: Activity: Goal: Risk for activity intolerance will decrease Outcome: Progressing

## 2023-09-07 DIAGNOSIS — J189 Pneumonia, unspecified organism: Secondary | ICD-10-CM | POA: Diagnosis not present

## 2023-09-07 LAB — CULTURE, BLOOD (ROUTINE X 2)
Culture: NO GROWTH
Culture: NO GROWTH
Special Requests: ADEQUATE
Special Requests: ADEQUATE

## 2023-09-07 NOTE — Plan of Care (Signed)
  Problem: Education: Goal: Knowledge of General Education information will improve Description: Including pain rating scale, medication(s)/side effects and non-pharmacologic comfort measures 09/07/2023 1630 by Franchot Lynnann Fine J, LPN Outcome: Progressing 09/07/2023 1626 by Franchot Lynnann Fine J, LPN Outcome: Progressing

## 2023-09-07 NOTE — Progress Notes (Signed)
 PROGRESS NOTE  Sabrina Roberson  DOB: 16-Dec-1970  PCP: Knute Thersia Bitters, FNP FMW:[id number]  DOA: 09/02/2023  LOS: 4 days  Hospital Day: 6  Brief narrative: Sabrina Roberson is a 53 y.o. female with PMH significant for DM2, HTN, HLD, CKD, TIA, anxiety, depression, fibromyalgia, GERD, hypothyroidism, asthma, bronchiectasis, h/o posterior influenza MRSA pneumonia in January 2025 requiring intubation, h/o 30 pack years smoking who recently quit smoking  Patient was recently seen by her PCP with recent history of ground-level fall, noted to have gait instability, and concern for paranoid behavior, ordered for CT head and referred for psychiatry outpatient.   8/5, patient presented with productive cough fever and hypoxia. Apparently, unable to comply with her medications for about a month due to financial constraint.  In the ED, patient was hemodynamically stable, WBC count was not elevated Respiratory virus panel unremarkable MRSA screening positive CTA chest did not show any evidence of pulm embolism.  Showed progressive atelectasis in the right middle lobe and a new 1.8 cm focal airspace infiltrate in the left lower lobe. Patient was started on empiric IV antibiotics with IV ceftriaxone , IV azithromycin  as well as IV vancomycin  given history of MRSA pneumonia. Also started on IV steroids Admitted to [facility]  Subjective: Patient was seen and examined this morning. Pleasant middle-aged Caucasian female.  Sitting up in recliner.  Not in distress.  Feels gradually improving but not yet ready to go home. Chart reviewed In the last 24 hours, afebrile, hemodynamically stable, breathing on room air Able to ambulate on the hallway today without supplemental oxygen.  Assessment and plan: Left lower lobe pneumonia H/o MRSA pneumonia, asthma, bronchiectasis Acute hypoxic respiratory failure Presented with productive cough, fever, hypoxia Respiratory viral panel negative. MRSA screening positive.  She and husband feel she has not fully recovered since original infection in January.  CTA chest showed new 1.8 cm focal airspace infiltrate centrally in the left lower lobe. Currently on empiric IV Rocephin , IV vancomycin .  Completed course of IV azithromycin  Patient was gradually improving but not titrated due to go home.  She is concerned she will be back again. Continue bronchodilators. Initially required supplemental oxygen. This morning, patient was able to ambulate in the hallway and maintain O2 sat over 90% Recent Labs  Lab 09/02/23 1527 09/02/23 1602 09/03/23 0236 09/04/23 0514 09/05/23 0433  WBC 4.8  --  5.0 5.2 5.3  LATICACIDVEN  --  <0.3*  --   --   --    Mild encephalopathy  CT head outpatient negative for acute abnormality MRI brain 8/6 negative as well.    Type 2 diabetes mellitus A1c 5.4 on 8//25 Diet controlled  H/o TIA HLD Continue statin.  Does not seem to be on antiplatelet  Hypertension Was on propranolol  at home. Vital signs noted bradycardia down to 40s.  Propranolol  currently on hold  Recent ground-level fall Gait instability  X-ray of the L-spine noting L5 pars defect and 12 mm anterolisthesis L5 over S1. No motor or sensory weakness PT/OT evaluation obtained.  Outpatient PT recommended   Paranoid behavior Noted by PCP outpatient, has been referred to psychiatry. Does not appear to be acutely exacerbated   Hypothyroidism patient endorses non-adherence due to affordability at least a month. TSH 64 on recent labs OP. T4 low.  Synthroid  resumed   Hypokalemia Improved with replacement Recent Labs  Lab 09/02/23 1527 09/03/23 0236 09/04/23 0514 09/05/23 0433 09/06/23 0633  K 3.7 3.7 3.9 3.4* 4.4  MG  --  2.1  --   --  2.2  PHOS  --  2.9  --   --  3.1     Mobility:  PT Follow up Rec: Outpatient Pt8/08/2023 [id number]   Goals of care   Code Status: Full Code     DVT prophylaxis:  enoxaparin  (LOVENOX ) injection 40 mg Start: 09/03/23  1000   Antimicrobials: Currently on IV vancomycin  and IV Rocephin  Fluid: None Consultants: None Family Communication: None at bedside  Status: Inpatient Level of care:  Med-Surg   Patient is from: Home Needs to continue in-hospital care: Gradually improving Anticipated d/c to: Hopefully home in 1 to 2 days    Diet:  Diet Order             Diet regular Room service appropriate? Yes; Fluid consistency: Thin  Diet effective now                   Scheduled Meds:  arformoterol   15 mcg Nebulization BID   And   umeclidinium bromide   1 puff Inhalation Daily   ARIPiprazole   5 mg Oral Daily   atorvastatin   20 mg Oral Daily   budesonide  (PULMICORT ) nebulizer solution  0.25 mg Nebulization BID   enoxaparin  (LOVENOX ) injection  40 mg Subcutaneous Q24H   gabapentin   600 mg Oral QHS   levothyroxine   100 mcg Oral Q0600   methylPREDNISolone  (SOLU-MEDROL ) injection  40 mg Intravenous Q12H   sodium chloride  flush  3 mL Intravenous Q12H   traZODone   100 mg Oral QHS    PRN meds: acetaminophen , albuterol , diazepam , melatonin, ondansetron  (ZOFRAN ) IV, polyethylene glycol   Infusions:   cefTRIAXone  (ROCEPHIN )  IV 1 g (09/06/23 1828)   thiamine  (VITAMIN B1) injection 250 mg (09/06/23 2124)   vancomycin  1,500 mg (09/06/23 2346)    Antimicrobials: Anti-infectives (From admission, onward)    Start     Dose/Rate Route Frequency Ordered Stop   09/05/23 0000  vancomycin  (VANCOREADY) IVPB 1500 mg/300 mL        1,500 mg 150 mL/hr over 120 Minutes Intravenous Every 24 hours 09/04/23 0757     09/04/23 2200  azithromycin  (ZITHROMAX ) tablet 500 mg        500 mg Oral Daily at bedtime 09/03/23 0053 09/05/23 2037   09/04/23 1900  cefTRIAXone  (ROCEPHIN ) 1 g in sodium chloride  0.9 % 100 mL IVPB        1 g 200 mL/hr over 30 Minutes Intravenous Every 24 hours 09/03/23 0053 09/08/23 1859   09/04/23 0000  vancomycin  (VANCOREADY) IVPB 1250 mg/250 mL  Status:  Discontinued        1,250 mg 166.7  mL/hr over 90 Minutes Intravenous Every 24 hours 09/03/23 0101 09/04/23 0757   09/02/23 2100  vancomycin  (VANCOCIN ) 500 mg in sodium chloride  0.9 % 100 mL IVPB       Placed in Followed by Linked Group   500 mg 100 mL/hr over 60 Minutes Intravenous  Once 09/02/23 1947 09/03/23 0129   09/02/23 2000  vancomycin  (VANCOCIN ) IVPB 1000 mg/200 mL premix       Placed in Followed by Linked Group   1,000 mg 200 mL/hr over 60 Minutes Intravenous  Once 09/02/23 1947 09/02/23 2213   09/02/23 1845  cefTRIAXone  (ROCEPHIN ) 1 g in sodium chloride  0.9 % 100 mL IVPB        1 g 200 mL/hr over 30 Minutes Intravenous  Once 09/02/23 1833 09/02/23 1929   09/02/23 1845  azithromycin  (ZITHROMAX ) 500 mg in sodium chloride  0.9 % 250 mL IVPB  500 mg 250 mL/hr over 60 Minutes Intravenous  Once 09/02/23 1833 09/02/23 2105       Objective: Vitals:   09/07/23 0846 09/07/23 1349  BP:  (!) 149/81  Pulse:  62  Resp:  16  Temp:  98.8 F (37.1 C)  SpO2: 95% 92%    Intake/Output Summary (Last 24 hours) at 09/07/2023 1413 Last data filed at 09/07/2023 0505 Gross per 24 hour  Intake 360 ml  Output 1550 ml  Net -1190 ml   Filed Weights   09/02/23 1512  Weight: 70.8 kg   Weight change:  Body mass index is 26.78 kg/m.   Physical Exam: General exam: Pleasant, middle-aged Caucasian female Skin: No rashes, lesions or ulcers. HEENT: Atraumatic, normocephalic, no obvious bleeding Lungs: Clear to auscultation bilaterally, no wheezing.  Mild bilateral basilar crackles.  Mild cough on deep breathing CVS: S1, S2, no murmur,   GI/Abd: Soft, nontender, nondistended, bowel sound present,   CNS: Alert, awake, oriented x 3 Psychiatry: Mood appropriate Extremities: No pedal edema, no calf tenderness,   Data Review: I have personally reviewed the laboratory data and studies available.  F/u labs ordered Unresulted Labs (From admission, onward)     Start     Ordered   09/08/23 0500  Basic metabolic panel with  GFR  Tomorrow morning,   R        09/07/23 1413   09/08/23 0500  CBC with Differential/Platelet  Tomorrow morning,   R        09/07/23 1413            Signed, Chapman Rota, MD [facility] 09/07/2023

## 2023-09-07 NOTE — Progress Notes (Signed)
SATURATION QUALIFICATIONS: (This note is used to comply with regulatory documentation for home oxygen)  Patient Saturations on Room Air at Rest = 97%  Patient Saturations on Room Air while Ambulating = 92%  Patient Saturations on 0 Liters of oxygen while Ambulating = 92%  Please briefly explain why patient needs home oxygen: 

## 2023-09-08 ENCOUNTER — Encounter (HOSPITAL_BASED_OUTPATIENT_CLINIC_OR_DEPARTMENT_OTHER): Payer: Self-pay

## 2023-09-08 ENCOUNTER — Other Ambulatory Visit (HOSPITAL_COMMUNITY): Payer: Self-pay

## 2023-09-08 DIAGNOSIS — J189 Pneumonia, unspecified organism: Secondary | ICD-10-CM | POA: Diagnosis not present

## 2023-09-08 LAB — BASIC METABOLIC PANEL WITH GFR
Anion gap: 11 (ref 5–15)
BUN: 16 mg/dL (ref 6–20)
CO2: 27 mmol/L (ref 22–32)
Calcium: 9 mg/dL (ref 8.9–10.3)
Chloride: 102 mmol/L (ref 98–111)
Creatinine, Ser: 0.82 mg/dL (ref 0.44–1.00)
GFR, Estimated: >60 mL/min (ref >60)
Glucose, Bld: 106 mg/dL — ABNORMAL HIGH (ref 70–99)
Potassium: 4 mmol/L (ref 3.5–5.1)
Sodium: 140 mmol/L (ref 135–145)

## 2023-09-08 LAB — CBC WITH DIFFERENTIAL/PLATELET
Abs Immature Granulocytes: 0.15 K/uL — ABNORMAL HIGH (ref 0.00–0.07)
Basophils Absolute: 0 K/uL (ref 0.0–0.1)
Basophils Relative: 0 %
Eosinophils Absolute: 0 K/uL (ref 0.0–0.5)
Eosinophils Relative: 0 %
HCT: 34.7 % — ABNORMAL LOW (ref 36.0–46.0)
Hemoglobin: 10.8 g/dL — ABNORMAL LOW (ref 12.0–15.0)
Immature Granulocytes: 2 %
Lymphocytes Relative: 19 %
Lymphs Abs: 1.5 K/uL (ref 0.7–4.0)
MCH: 29.6 pg (ref 26.0–34.0)
MCHC: 31.1 g/dL (ref 30.0–36.0)
MCV: 95.1 fL (ref 80.0–100.0)
Monocytes Absolute: 0.3 K/uL (ref 0.1–1.0)
Monocytes Relative: 5 %
Neutro Abs: 5.6 K/uL (ref 1.7–7.7)
Neutrophils Relative %: 74 %
Platelets: 354 K/uL (ref 150–400)
RBC: 3.65 MIL/uL — ABNORMAL LOW (ref 3.87–5.11)
RDW: 13.7 % (ref 11.5–15.5)
WBC: 7.6 K/uL (ref 4.0–10.5)
nRBC: 0 % (ref 0.0–0.2)

## 2023-09-08 MED ORDER — PREDNISONE 10 MG PO TABS
ORAL_TABLET | ORAL | 0 refills | Status: DC
  Filled 2023-09-08: qty 19, 7d supply, fill #0

## 2023-09-08 MED ORDER — LEVOTHYROXINE SODIUM 100 MCG PO TABS
100.0000 ug | ORAL_TABLET | Freq: Every day | ORAL | 0 refills | Status: DC
  Filled 2023-09-08: qty 90, 90d supply, fill #0

## 2023-09-08 MED ORDER — DOXYCYCLINE HYCLATE 100 MG PO TABS
100.0000 mg | ORAL_TABLET | Freq: Two times a day (BID) | ORAL | 0 refills | Status: AC
  Filled 2023-09-08: qty 10, 5d supply, fill #0

## 2023-09-08 MED ORDER — FLORANEX PO PACK
1.0000 g | PACK | Freq: Three times a day (TID) | ORAL | 0 refills | Status: AC
  Filled 2023-09-08: qty 24, 8d supply, fill #0

## 2023-09-08 MED ORDER — AMOXICILLIN-POT CLAVULANATE 875-125 MG PO TABS
1.0000 | ORAL_TABLET | Freq: Two times a day (BID) | ORAL | 0 refills | Status: DC
  Filled 2023-09-08: qty 10, 5d supply, fill #0

## 2023-09-08 NOTE — Plan of Care (Signed)
  Problem: Clinical Measurements: Goal: Diagnostic test results will improve Outcome: Progressing   Problem: Activity: Goal: Risk for activity intolerance will decrease Outcome: Progressing   Problem: Pain Managment: Goal: General experience of comfort will improve and/or be controlled Outcome: Progressing

## 2023-09-08 NOTE — Discharge Summary (Addendum)
 Physician Discharge Summary  Sabrina Roberson FMW:[id number] DOB: 1971/01/18 DOA: 09/02/2023  PCP: Knute Thersia Bitters, FNP  Admit date: 09/02/2023 Discharge date: 09/08/2023  Admitted from: Home Discharge disposition: Home  Recommendations at discharge:  Complete the course of antibiotics with 5 more days of oral Augmentin  with probiotics Complete tapering course of prednisone  Can stop metformin . Improve compliance with medications including Synthroid    Brief narrative: Sabrina Roberson is a 53 y.o. female with PMH significant for DM2, HTN, HLD, CKD, TIA, anxiety, depression, fibromyalgia, GERD, hypothyroidism, asthma, bronchiectasis, h/o posterior influenza MRSA pneumonia in January 2025 requiring intubation, h/o 30 pack years smoking who recently quit smoking  Patient was recently seen by her PCP with recent history of ground-level fall, noted to have gait instability, and concern for paranoid behavior, ordered for CT head and referred for psychiatry outpatient.   8/5, patient presented with productive cough fever and hypoxia. Apparently, unable to comply with her medications for about a month due to financial constraint.  In the ED, patient was hemodynamically stable, WBC count was not elevated Respiratory virus panel unremarkable MRSA screening positive CTA chest did not show any evidence of pulm embolism.  Showed progressive atelectasis in the right middle lobe and a new 1.8 cm focal airspace infiltrate in the left lower lobe. Patient was started on empiric IV antibiotics with IV ceftriaxone , IV azithromycin  as well as IV vancomycin  given history of MRSA pneumonia. Also started on IV steroids Admitted to [facility]  Subjective: Patient was seen and examined this morning. Sitting up in bed.  Not in distress not on supplemental oxygen.  Able to ambulate without oxygen.  Feels better than at presentation.  Husband at bedside. Feels ready for discharge home today  Hospital course: Left  lower lobe pneumonia H/o MRSA pneumonia, asthma, bronchiectasis Acute hypoxic respiratory failure Presented with productive cough, fever, hypoxia Respiratory viral panel negative. MRSA screening positive. She and husband feel she has not fully recovered since original infection in January.  CTA chest showed new 1.8 cm focal airspace infiltrate centrally in the left lower lobe. For the last 1 week, received a course of IV Rocephin , IV vancomycin .  Completed course of IV azithromycin . Also on IV steroids, currently on Solu-Medrol  40 mg twice daily Clinically better.  Able to ambulate without supplemental oxygen Given the severity and recurrence of pneumonia at discharge, will continue Doxycycline  next 5 days with probiotics Complete tapering course of prednisone  Continue bronchodilators. Patient has a follow-up appointment with Dr. Kara Recent Labs  Lab 09/02/23 1527 09/02/23 1602 09/03/23 0236 09/04/23 0514 09/05/23 0433 09/08/23 0524  WBC 4.8  --  5.0 5.2 5.3 7.6  LATICACIDVEN  --  <0.3*  --   --   --   --    Mild encephalopathy  CT head outpatient negative for acute abnormality MRI brain 8/6 negative as well.    Type 2 diabetes mellitus A1c 5.4 on 8//25 Was on metformin  5 mg daily at home.  I do not think she needs it.  H/o TIA HLD Continue statin.  Does not seem to be on antiplatelet  Anxiety /Bradycardia Was on propranolol  at home. Vital signs noted bradycardia down to 40s.  Propranolol  currently on hold. Patient states he has been using it for tremors.  I have advised patient to avoid it unless absolutely needed.  To follow-up with PCP for any other alternative Valium  as needed as before  Recent ground-level fall Gait instability  X-ray of the L-spine noting L5 pars defect  and 12 mm anterolisthesis L5 over S1. No motor or sensory weakness PT/OT evaluation obtained.  Outpatient PT recommended   Paranoid behavior Noted by PCP outpatient, has been referred to  psychiatry. Does not appear to be acutely exacerbated   Hypothyroidism patient endorses non-adherence due to affordability at least a month. TSH 64 on recent labs OP. T4 low.  Synthroid  resumed   Hypokalemia Improved with replacement Recent Labs  Lab 09/03/23 0236 09/04/23 0514 09/05/23 0433 09/06/23 0633 09/08/23 0524  K 3.7 3.9 3.4* 4.4 4.0  MG 2.1  --   --  2.2  --   PHOS 2.9  --   --  3.1  --      Mobility:  PT Follow up Rec: Outpatient Pt8/08/2023 [id number]   Goals of care   Code Status: Full Code   Diet:  Diet Order             Diet general           Diet regular Room service appropriate? Yes; Fluid consistency: Thin  Diet effective now                   Nutritional status:  Body mass index is 26.78 kg/m.       Wounds:  -    Discharge Exam:   Vitals:   09/07/23 2104 09/08/23 0448 09/08/23 0818 09/08/23 0823  BP: 136/88 112/67    Pulse: 64 (!) 57    Resp: 18 18    Temp: 98.1 F (36.7 C) 98.4 F (36.9 C)    TempSrc:  Oral    SpO2: 91% 91% 99% 100%  Weight:      Height:        Body mass index is 26.78 kg/m.  General exam: Pleasant, middle-aged Caucasian female Skin: No rashes, lesions or ulcers. HEENT: Atraumatic, normocephalic, no obvious bleeding Lungs: Clear to auscultation bilaterally, no crackles.  Mild wheezing over left lower lobe likely due to bronchiectasis CVS: S1, S2, no murmur,   GI/Abd: Soft, nontender, nondistended, bowel sound present,   CNS: Alert, awake, oriented x 3 Psychiatry: Mood appropriate Extremities: No pedal edema, no calf tenderness,   Follow ups:    Follow-up Information     Caudle, Thersia Bitters, FNP Follow up.   Specialty: Family Medicine Contact information: [address] [place] [address] [phone]                 Discharge Instructions:   Discharge Instructions     Call MD for:  difficulty breathing, headache or visual disturbances   Complete by: As  directed    Call MD for:  extreme fatigue   Complete by: As directed    Call MD for:  hives   Complete by: As directed    Call MD for:  persistant dizziness or light-headedness   Complete by: As directed    Call MD for:  persistant nausea and vomiting   Complete by: As directed    Call MD for:  severe uncontrolled pain   Complete by: As directed    Call MD for:  temperature >100.4   Complete by: As directed    Diet general   Complete by: As directed    Discharge instructions   Complete by: As directed    Recommendations at discharge:   Complete the course of antibiotics with 5 more days of oral Augmentin  with probiotics  Complete tapering course of prednisone   Can stop metformin .  Improve compliance  with medications including Synthroid   General discharge instructions: Follow with Primary MD Knute, Thersia Bitters, FNP in 7 days  Please request your PCP  to go over your hospital tests, procedures, radiology results at the follow up. Please get your medicines reviewed and adjusted.  Your PCP may decide to repeat certain labs or tests as needed. Do not drive, operate heavy machinery, perform activities at heights, swimming or participation in water activities or provide baby sitting services if your were admitted for syncope or siezures until you have seen by Primary MD or a Neurologist and advised to do so again. [place]  Controlled Substance Reporting System database was reviewed. Do not drive, operate heavy machinery, perform activities at heights, swim, participate in water activities or provide baby-sitting services while on medications for pain, sleep and mood until your outpatient physician has reevaluated you and advised to do so again.  You are strongly recommended to comply with the dose, frequency and duration of prescribed medications. Activity: As tolerated with Full fall precautions use walker/cane & assistance as needed Avoid using any recreational substances like  cigarette, tobacco, alcohol, or non-prescribed drug. If you experience worsening of your admission symptoms, develop shortness of breath, life threatening emergency, suicidal or homicidal thoughts you must seek medical attention immediately by calling 911 or calling your MD immediately  if symptoms less severe. You must read complete instructions/literature along with all the possible adverse reactions/side effects for all the medicines you take and that have been prescribed to you. Take any new medicine only after you have completely understood and accepted all the possible adverse reactions/side effects.  Wear Seat belts while driving. You were cared for by a hospitalist during your hospital stay. If you have any questions about your discharge medications or the care you received while you were in the hospital after you are discharged, you can call the unit and ask to speak with the hospitalist or the covering physician. Once you are discharged, your primary care physician will handle any further medical issues. Please note that NO REFILLS for any discharge medications will be authorized once you are discharged, as it is imperative that you return to your primary care physician (or establish a relationship with a primary care physician if you do not have one).   Increase activity slowly   Complete by: As directed        Discharge Medications:   Allergies as of 09/08/2023       Reactions   Latex Anaphylaxis, Swelling   Codeine Nausea And Vomiting   GI Upset (intolerance), Vomiting (intolerance)   Ibuprofen Other (See Comments)   GI upset, burning sensation   Oxycodone  Nausea And Vomiting   Pre-medication with phenergan  needed.   Tape Itching, Rash   Please use paper tape only.        Medication List     STOP taking these medications    amoxicillin -clavulanate 875-125 MG tablet Commonly known as: AUGMENTIN    metFORMIN  500 MG 24 hr tablet Commonly known as: GLUCOPHAGE -XR    pantoprazole  40 MG tablet Commonly known as: PROTONIX    propranolol  80 MG tablet Commonly known as: INDERAL    sodium chloride  HYPERTONIC 3 % nebulizer solution       TAKE these medications    albuterol  108 (90 Base) MCG/ACT inhaler Commonly known as: VENTOLIN  HFA TAKE 2 PUFFS BY MOUTH EVERY 6 HOURS AS NEEDED FOR WHEEZE OR SHORTNESS OF BREATH   ARIPiprazole  5 MG tablet Commonly known as: ABILIFY  Take 5 mg by  mouth daily.   atorvastatin  20 MG tablet Commonly known as: LIPITOR Take 1 tablet (20 mg total) by mouth at bedtime.   Bevespi  Aerosphere 9-4.8 MCG/ACT Aero Generic drug: Glycopyrrolate-Formoterol Inhale 2 puffs into the lungs 2 (two) times daily.   diazepam  5 MG tablet Commonly known as: VALIUM  TAKE 1 TABLET (5 MG TOTAL) BY MOUTH TWICE A DAY AS NEEDED FOR ANXIETY What changed: See the new instructions.   dicyclomine  10 MG capsule Commonly known as: BENTYL  Take 1 capsule (10 mg total) by mouth 4 (four) times daily -  before meals and at bedtime. What changed:  when to take this reasons to take this   doxycycline  100 MG tablet Commonly known as: VIBRA -TABS Take 1 tablet (100 mg total) by mouth 2 (two) times daily for 5 days.   esomeprazole 20 MG capsule Commonly known as: NEXIUM Take 20 mg by mouth daily at 12 noon.   furosemide  20 MG tablet Commonly known as: LASIX  Take 1 tablet (20 mg total) by mouth daily for 5 days. What changed:  when to take this reasons to take this   gabapentin  600 MG tablet Commonly known as: NEURONTIN  TAKE 1 TABLET BY MOUTH AT BEDTIME. What changed: when to take this   ipratropium-albuterol  0.5-2.5 (3) MG/3ML Soln Commonly known as: DUONEB Take 3 mLs by nebulization every 4 (four) hours as needed (shortness of breath, wheezing, or cough).   lactobacillus Pack Take 1 packet (1 g total) by mouth 3 (three) times daily with meals for 7 days.   levothyroxine  100 MCG tablet Commonly known as: SYNTHROID  Take 1 tablet (100  mcg total) by mouth daily at 6 (six) AM. Start taking on: September 09, 2023 What changed:  medication strength how much to take when to take this   predniSONE  10 MG tablet Commonly known as: DELTASONE  Take 4 tablets by mouth daily for 2 days THEN take 3 tablets daily for 2 days THEN take 2 tablets daily for 2  days THEN take 1 tablet daily for 1 day. What changed: additional instructions   promethazine  25 MG tablet Commonly known as: PHENERGAN  Take 1 tablet (25 mg total) by mouth every 8 (eight) hours as needed for nausea or vomiting.   traZODone  100 MG tablet Commonly known as: DESYREL  Take 100 mg by mouth at bedtime.         The results of significant diagnostics from this hospitalization (including imaging, microbiology, ancillary and laboratory) are listed below for reference.    Procedures and Diagnostic Studies:   MR BRAIN W WO CONTRAST Result Date: 09/03/2023 CLINICAL DATA:  Abnl cerebellar function, proprioception. Eval for cerebellar lesion EXAM: MRI HEAD WITHOUT AND WITH CONTRAST TECHNIQUE: Multiplanar, multiecho pulse sequences of the brain and surrounding structures were obtained without and with intravenous contrast. CONTRAST:  7mL GADAVIST  GADOBUTROL  1 MMOL/ML IV SOLN COMPARISON:  CT head 09/01/2023. FINDINGS: Brain: No acute infarction, hemorrhage, hydrocephalus, extra-axial collection or mass lesion. Mild T2/FLAIR hyperintensities in the white matter, which are nonspecific but compatible with chronic microvascular ischemic disease. Vascular: Normal flow voids. Skull and upper cervical spine: Normal marrow signal. Sinuses/Orbits: Negative. IMPRESSION: Normal brain MRI. No acute abnormality. Electronically Signed   By: Gilmore GORMAN Molt M.D.   On: 09/03/2023 13:15   CT Angio Chest PE W and/or Wo Contrast Result Date: 09/02/2023 EXAM: CTA of the Chest with contrast for PE 09/02/2023 05:33:38 PM TECHNIQUE: CTA of the chest was performed after the administration of intravenous  contrast. Multiplanar reformatted images are provided  for review. MIP images are provided for review. Automated exposure control, iterative reconstruction, and/or weight based adjustment of the mA/kV was utilized to reduce the radiation dose to as low as reasonably achievable. COMPARISON: 05/16/2023 CLINICAL HISTORY: Pulmonary embolism (PE) suspected, low to intermediate prob, positive D-dimer; cough, SOB, hypoxia. Triage note: Pt POV reporting chest pain, congestion, and SOB x1 week. Recurrent issues with pneumonia r/t hospitalization in Jan, was intubated. Called pulmonologist and was advised to come to [facility] for chest xray due to no appts available. FINDINGS: PULMONARY ARTERIES: Pulmonary arteries are adequately opacified for evaluation. No pulmonary embolism. Main pulmonary artery is normal in caliber. MEDIASTINUM: The heart and pericardium demonstrate no acute abnormality. Scattered calcified plaque in the descending thoracic and visualized abdominal aorta. LYMPH NODES: No mediastinal, hilar or axillary lymphadenopathy. LUNGS AND PLEURA: Progressive atelectasis in the right middle lobe. New 1.8 cm focal airspace infiltrate centrally in the left lower lobe (series 7, image 63). Resolving subpleural nodules previously noted posteriorly at the left lung base. No pleural effusion or pneumothorax. UPPER ABDOMEN: Cholecystectomy clips. SOFT TISSUES AND BONES: Spondylitic changes in the visualized lower cervical spine. No acute bone or soft tissue abnormality. IMPRESSION: 1. No pulmonary embolism. 2. Progressive atelectasis in the right middle lobe and new 1.8 cm focal airspace infiltrate centrally in the left lower lobe. Electronically signed by: Katheleen Faes MD 09/02/2023 06:28 PM EDT RP Workstation: [id number]   DG Chest 2 View Result Date: 09/02/2023 CLINICAL DATA:  Chest pain. EXAM: DG CHEST 2V COMPARISON:  Jun 27, 2023 FINDINGS: The heart size and mediastinal contours are within normal limits. Low lung volumes  are noted with mild, diffuse, chronic appearing increased interstitial lung markings. Mild left upper lobe linear scarring and/or atelectasis is seen within the left upper lobe. No pleural effusion or pneumothorax is identified. Radiopaque surgical clips are seen within the right upper quadrant. The visualized skeletal structures are unremarkable. IMPRESSION: Low lung volumes with mild left upper lobe linear scarring and/or atelectasis. Electronically Signed   By: Suzen Dials M.D.   On: 09/02/2023 16:12   DG Hip Unilat W OR W/O Pelvis 1V Right Result Date: 09/02/2023 CLINICAL DATA:  Fall, right hip tenderness EXAM: DG HIP (WITH OR WITHOUT PELVIS) 1V RIGHT COMPARISON:  None Available. FINDINGS: Normal alignment. No acute fracture or dislocation. Joint spaces are preserved. Soft tissues are unremarkable IMPRESSION: 1. Negative. Electronically Signed   By: Dorethia Molt M.D.   On: 09/02/2023 15:38   DG Lumbar Spine Complete Result Date: 09/02/2023 CLINICAL DATA:  Back pain. EXAM: LUMBAR SPINE - COMPLETE 4+ VIEW COMPARISON:  Abdominal radiograph dated 01/29/2023. FINDINGS: Five lumbar type vertebra. There is no acute fracture or subluxation of the lumbar spine. There is L5 pars defects with 12 mm anterolisthesis of L5 over S1. There is degenerative changes at L5-S1 with spurring and osteophyte. The bones are osteopenic. Atherosclerotic calcification of the abdominal aorta. Right upper quadrant cholecystectomy clips. IMPRESSION: 1. No acute fracture or subluxation of the lumbar spine. 2. L5 pars defects with 12 mm anterolisthesis of L5 over S1. Electronically Signed   By: Vanetta Chou M.D.   On: 09/02/2023 15:36     Labs:   Basic Metabolic Panel: Recent Labs  Lab 09/03/23 0236 09/04/23 0514 09/05/23 0433 09/06/23 0633 09/08/23 0524  NA 137 142 142 142 140  K 3.7 3.9 3.4* 4.4 4.0  CL 99 102 101 101 102  CO2 28 32 32 28 27  GLUCOSE 145* 80 90 130* 106*  BUN 10 13 8 8 16   CREATININE 0.90  0.89 0.89 0.77 0.82  CALCIUM  7.9* 8.5* 8.3* 9.1 9.0  MG 2.1  --   --  2.2  --   PHOS 2.9  --   --  3.1  --    GFR Estimated Creatinine Clearance: 78.3 mL/min (by C-G formula based on SCr of 0.82 mg/dL). Liver Function Tests: Recent Labs  Lab 09/06/23 [phone]  ALBUMIN 3.0*   No results for input(s): LIPASE, AMYLASE in the last 168 hours. No results for input(s): AMMONIA in the last 168 hours. Coagulation profile No results for input(s): INR, PROTIME in the last 168 hours.  CBC: Recent Labs  Lab 09/02/23 1527 09/03/23 0236 09/04/23 0514 09/05/23 0433 09/08/23 0524  WBC 4.8 5.0 5.2 5.3 7.6  NEUTROABS  --   --   --   --  5.6  HGB 10.4* 10.2* 9.3* 9.4* 10.8*  HCT 34.3* 34.8* 32.4* 33.1* 34.7*  MCV 96.3 98.6 100.3* 100.3* 95.1  PLT 298 284 238 272 354   Cardiac Enzymes: No results for input(s): CKTOTAL, CKMB, CKMBINDEX, TROPONINI in the last 168 hours. BNP: Invalid input(s): POCBNP CBG: No results for input(s): GLUCAP in the last 168 hours. D-Dimer No results for input(s): DDIMER in the last 72 hours. Hgb A1c No results for input(s): HGBA1C in the last 72 hours. Lipid Profile No results for input(s): CHOL, HDL, LDLCALC, TRIG, CHOLHDL, LDLDIRECT in the last 72 hours. Thyroid  function studies No results for input(s): TSH, T4TOTAL, T3FREE, THYROIDAB in the last 72 hours.  Invalid input(s): FREET3 Anemia work up No results for input(s): VITAMINB12, FOLATE, FERRITIN, TIBC, IRON, RETICCTPCT in the last 72 hours. Microbiology Recent Results (from the past 240 hours)  Resp panel by RT-PCR (RSV, Flu A&B, Covid) Anterior Nasal Swab     Status: None   Collection Time: 09/02/23  4:22 PM   Specimen: Anterior Nasal Swab  Result Value Ref Range Status   SARS Coronavirus 2 by RT PCR NEGATIVE NEGATIVE Final    Comment: (NOTE) SARS-CoV-2 target nucleic acids are NOT DETECTED.  The SARS-CoV-2 RNA is generally detectable in  upper respiratory specimens during the acute phase of infection. The lowest concentration of SARS-CoV-2 viral copies this assay can detect is 138 copies/mL. A negative result does not preclude SARS-Cov-2 infection and should not be used as the sole basis for treatment or other patient management decisions. A negative result may occur with  improper specimen collection/handling, submission of specimen other than nasopharyngeal swab, presence of viral mutation(s) within the areas targeted by this assay, and inadequate number of viral copies(<138 copies/mL). A negative result must be combined with clinical observations, patient history, and epidemiological information. The expected result is Negative.  Fact Sheet for Patients:  BloggerCourse.com  Fact Sheet for Healthcare Providers:  SeriousBroker.it  This test is no t yet approved or cleared by the [place]  FDA and  has been authorized for detection and/or diagnosis of SARS-CoV-2 by FDA under an Emergency Use Authorization (EUA). This EUA will remain  in effect (meaning this test can be used) for the duration of the COVID-19 declaration under Section 564(b)(1) of the Act, 21 U.S.C.section 360bbb-3(b)(1), unless the authorization is terminated  or revoked sooner.       Influenza A by PCR NEGATIVE NEGATIVE Final   Influenza B by PCR NEGATIVE NEGATIVE Final    Comment: (NOTE) The Xpert Xpress SARS-CoV-2/FLU/RSV plus assay is intended as an aid in the diagnosis of influenza from Nasopharyngeal swab  specimens and should not be used as a sole basis for treatment. Nasal washings and aspirates are unacceptable for Xpert Xpress SARS-CoV-2/FLU/RSV testing.  Fact Sheet for Patients: BloggerCourse.com  Fact Sheet for Healthcare Providers: SeriousBroker.it  This test is not yet approved or cleared by the [place]  FDA and has been  authorized for detection and/or diagnosis of SARS-CoV-2 by FDA under an Emergency Use Authorization (EUA). This EUA will remain in effect (meaning this test can be used) for the duration of the COVID-19 declaration under Section 564(b)(1) of the Act, 21 U.S.C. section 360bbb-3(b)(1), unless the authorization is terminated or revoked.     Resp Syncytial Virus by PCR NEGATIVE NEGATIVE Final    Comment: (NOTE) Fact Sheet for Patients: BloggerCourse.com  Fact Sheet for Healthcare Providers: SeriousBroker.it  This test is not yet approved or cleared by the [place]  FDA and has been authorized for detection and/or diagnosis of SARS-CoV-2 by FDA under an Emergency Use Authorization (EUA). This EUA will remain in effect (meaning this test can be used) for the duration of the COVID-19 declaration under Section 564(b)(1) of the Act, 21 U.S.C. section 360bbb-3(b)(1), unless the authorization is terminated or revoked.  Performed at [facility], [address] [place], [place] [address]   Blood culture (routine x 2)     Status: None   Collection Time: 09/02/23  6:34 PM   Specimen: BLOOD  Result Value Ref Range Status   Specimen Description   Final    BLOOD LEFT ANTECUBITAL Performed at [facility], [address], [place], [place] [address]    Special Requests   Final    BOTTLES DRAWN AEROBIC AND ANAEROBIC Blood Culture adequate volume Performed at [facility], [address] [place], [place] [address]    Culture   Final    NO GROWTH 5 DAYS Performed at [facility], [address] [place], [place] [address]    Report Status 09/07/2023 FINAL  Final  Blood culture (routine x 2)     Status: None   Collection Time: 09/02/23  6:39 PM   Specimen: BLOOD  Result Value Ref Range Status   Specimen Description   Final    BLOOD RIGHT ANTECUBITAL Performed at [facility], [address] [place], [place] [address]    Special Requests   Final    Blood Culture adequate volume BOTTLES DRAWN AEROBIC AND ANAEROBIC Performed at [facility], [address], [place], [place] [address]    Culture   Final    NO GROWTH 5 DAYS Performed at [facility], [address], [place], [place] [address]    Report Status 09/07/2023 FINAL  Final  Expectorated Sputum Assessment w Gram Stain, Rflx to Resp Cult     Status: None   Collection Time: 09/03/23 12:50 AM   Specimen: Expectorated Sputum  Result Value Ref Range Status   Specimen Description EXPECTORATED SPUTUM  Final   Special Requests NONE  Final   Sputum evaluation   Final    THIS SPECIMEN IS ACCEPTABLE FOR SPUTUM CULTURE Performed at [facility], [address], [place], [place] [address]    Report Status 09/03/2023 FINAL  Final  Culture, Respiratory w Gram Stain     Status: None   Collection Time: 09/03/23 12:50 AM  Result Value Ref Range Status   Specimen Description   Final    EXPECTORATED SPUTUM Performed at [facility], [address] [place],  [place] [address]    Special Requests   Final    NONE Reflexed from [id number] Performed at [facility], [address], [place], [place] [address]    Gram Stain   Final    ABUNDANT WBC PRESENT, PREDOMINANTLY PMN RARE GRAM POSITIVE COCCI IN PAIRS Performed at [facility], [address] [place], [place] [address]    Culture   Final    RARE METHICILLIN RESISTANT STAPHYLOCOCCUS AUREUS RARE STENOTROPHOMONAS MALTOPHILIA    Report Status 09/06/2023 FINAL  Final   Organism ID, Bacteria STENOTROPHOMONAS MALTOPHILIA  Final   Organism ID, Bacteria METHICILLIN RESISTANT STAPHYLOCOCCUS AUREUS  Final      Susceptibility   Methicillin resistant staphylococcus aureus - MIC*    CIPROFLOXACIN >=8 RESISTANT Resistant     ERYTHROMYCIN >=8 RESISTANT Resistant      GENTAMICIN <=0.5 SENSITIVE Sensitive     OXACILLIN >=4 RESISTANT Resistant     TETRACYCLINE <=1 SENSITIVE Sensitive     VANCOMYCIN  1 SENSITIVE Sensitive     TRIMETH/SULFA >=320 RESISTANT Resistant     CLINDAMYCIN <=0.25 SENSITIVE Sensitive     RIFAMPIN <=0.5 SENSITIVE Sensitive     Inducible Clindamycin NEGATIVE Sensitive     LINEZOLID  2 SENSITIVE Sensitive     * RARE METHICILLIN RESISTANT STAPHYLOCOCCUS AUREUS   Stenotrophomonas maltophilia - MIC*    LEVOFLOXACIN 0.25 SENSITIVE Sensitive     TRIMETH/SULFA <=20 SENSITIVE Sensitive     * RARE STENOTROPHOMONAS MALTOPHILIA  Respiratory (~20 pathogens) panel by PCR     Status: None   Collection Time: 09/03/23  3:15 AM   Specimen: Nasopharyngeal Swab; Respiratory  Result Value Ref Range Status   Adenovirus NOT DETECTED NOT DETECTED Final   Coronavirus 229E NOT DETECTED NOT DETECTED Final    Comment: (NOTE) The Coronavirus on the Respiratory Panel, DOES NOT test for the novel  Coronavirus (2019 nCoV)    Coronavirus HKU1 NOT DETECTED NOT DETECTED Final   Coronavirus NL63 NOT DETECTED NOT DETECTED Final   Coronavirus OC43 NOT DETECTED NOT DETECTED Final   Metapneumovirus NOT DETECTED NOT DETECTED Final   Rhinovirus / Enterovirus NOT DETECTED NOT DETECTED Final   Influenza A NOT DETECTED NOT DETECTED Final   Influenza B NOT DETECTED NOT DETECTED Final   Parainfluenza Virus 1 NOT DETECTED NOT DETECTED Final   Parainfluenza Virus 2 NOT DETECTED NOT DETECTED Final   Parainfluenza Virus 3 NOT DETECTED NOT DETECTED Final   Parainfluenza Virus 4 NOT DETECTED NOT DETECTED Final   Respiratory Syncytial Virus NOT DETECTED NOT DETECTED Final   Bordetella pertussis NOT DETECTED NOT DETECTED Final   Bordetella Parapertussis NOT DETECTED NOT DETECTED Final   Chlamydophila pneumoniae NOT DETECTED NOT DETECTED Final   Mycoplasma pneumoniae NOT DETECTED NOT DETECTED Final    Comment: Performed at [facility], [address],  [place], [place] [address]  MRSA Next Gen by PCR, Nasal     Status: Abnormal   Collection Time: 09/03/23  2:27 PM   Specimen: Nasal Mucosa; Nasal Swab  Result Value Ref Range Status   MRSA by PCR Next Gen DETECTED (A) NOT DETECTED Final    Comment: (NOTE) The GeneXpert MRSA Assay (FDA approved for NASAL specimens only), is one component of a comprehensive MRSA colonization surveillance program. It is not intended to diagnose MRSA infection nor to guide or monitor treatment for MRSA infections. Test performance is not FDA approved in patients less than 31 years old. Performed at [facility] [address] [place], [place] [address]     Time coordinating discharge: 45 minutes  Signed: Chapman Macaulay Reicher  [facility] 09/08/2023, 12:05 PM

## 2023-09-08 NOTE — Progress Notes (Signed)
 Discharge medications delivered to patient at bedside [facility]

## 2023-09-08 NOTE — Plan of Care (Signed)

## 2023-09-08 NOTE — Progress Notes (Signed)
 Revised discharge medication returned to pharmacy and new discharge medication delivered to patient D Johnie RN

## 2023-09-24 ENCOUNTER — Ambulatory Visit (INDEPENDENT_AMBULATORY_CARE_PROVIDER_SITE_OTHER): Admitting: Family Medicine

## 2023-09-24 ENCOUNTER — Encounter (HOSPITAL_BASED_OUTPATIENT_CLINIC_OR_DEPARTMENT_OTHER): Payer: Self-pay | Admitting: Family Medicine

## 2023-09-24 VITALS — BP 140/98 | HR 95 | Ht 64.0 in | Wt 151.2 lb

## 2023-09-24 DIAGNOSIS — I10 Essential (primary) hypertension: Secondary | ICD-10-CM | POA: Diagnosis not present

## 2023-09-24 DIAGNOSIS — J15212 Pneumonia due to Methicillin resistant Staphylococcus aureus: Secondary | ICD-10-CM

## 2023-09-24 DIAGNOSIS — F22 Delusional disorders: Secondary | ICD-10-CM

## 2023-09-24 DIAGNOSIS — Z09 Encounter for follow-up examination after completed treatment for conditions other than malignant neoplasm: Secondary | ICD-10-CM

## 2023-09-24 DIAGNOSIS — E039 Hypothyroidism, unspecified: Secondary | ICD-10-CM | POA: Diagnosis not present

## 2023-09-24 MED ORDER — AMLODIPINE BESYLATE 5 MG PO TABS: 5.0000 mg | ORAL_TABLET | Freq: Every day | ORAL | 3 refills | Status: DC

## 2023-09-24 NOTE — Patient Instructions (Addendum)
 Start baby Aspirin 81mg  daily.   Start Amlodipine  5mg  daily and keep log of blood pressure.  Bring log to your next appt.    Continue your neb treatments 2-3x daily  Continue Mucinex  (plain, not DM) as needed for congestion.    Please have psychiatry send notes to our office.

## 2023-09-24 NOTE — Progress Notes (Signed)
 Subjective:   Sabrina Roberson [birth year] 09/24/2023  Chief Complaint  Patient presents with   Hospitalization Follow-up    Patient is here following up after recent hospital stay. States she has been feeling better since the visit. Pt states her O2 sats will sometimes drop with ambulation and states she is still coughing up phlegm that is yellow-green in color.    HPI: Sabrina Roberson is a send 53 year old female with history of CVA, GAD, type 2 diabetes with diet control, hypertension, acquired hypothyroidism, CKD stage II, recurrent MRSA pneumonia who presents today for hospital follow up.   Patient was admitted to [facility] hospital on 09/02/2023 with presentation of cough, fever, and hypoxia.  Patient has been having concern for paranoid behavior and issues with adherence to medications.  She had CT head evaluation and psychiatry referral placed by PCP 1 day prior.  She reported symptoms came on quickly after seeing PCP.  Does have a significant history of MRSA pneumonia with admission requiring intubation earlier in the year.  Patient was admitted and remained hemodynamically stable.  CTA chest showed progressive atelectasis in the right middle lobe and a new 1.8 cm focal airspace infiltrate in the left lower lobe.  She was started on empiric IV antibiotics with IV ceftriaxone , azithromycin , vancomycin .  She was also started on IV steroids.  She was restarted on her levothyroxine  as she reported not taking for the past month.  MRSA screening was positive, respiratory virus panel was unremarkable.  Patient remained admitted for 1 week and began to improve with IV antibiotics.  She was able to ambulate without any supplemental oxygen and was discharged home with doxycycline  for 5 days and a tapering course of prednisone .  She has a follow-up appointment with her pulmonologist Dr. Kara on 10/25/2023.  She is currently undergoing evaluation for possible aspiration pneumonia risk and has an  appointment with ENT on 10/15/2023.  Patient reports improvement of paranoid behavior and depression after taking her medication consistently.  She states symptoms have improved and she is accompanied by her husband who is agreeable with patient today.  She does have a follow-up with psychiatry on 09/26/2023 who is currently managing her Abilify .  Patient has a history of hypertension and was previously taking propranolol .  This was discontinued during admission due to bradycardia.  Since discharge, patient has noticed increase in blood pressure.  She is not currently taking any current BP medications.  The following portions of the patient's history were reviewed and updated as appropriate: past medical history, past surgical history, family history, social history, allergies, medications, and problem list.   Patient Active Problem List   Diagnosis Date Noted   AMS (altered mental status) 09/03/2023   Chronic cough 09/02/2023   Dysdiadochokinesia 09/02/2023   Pneumonia 09/02/2023   Severe episode of recurrent major depressive disorder, with psychotic features (HCC) 07/08/2023   Paranoid behavior (HCC) 07/08/2023   Lymphadenopathy, axillary 07/08/2023   Recurrent pneumonia 06/27/2023   Bronchiectasis without complication (HCC) 06/10/2023   Localized swelling of both lower legs 05/29/2023   Pneumonia due to methicillin resistant Staphylococcus aureus (MRSA) (HCC) 04/03/2023   Physical deconditioning 02/17/2023   Bilateral lower extremity edema 12/03/2022   Bilateral carotid bruits 11/04/2022   CKD (chronic kidney disease) stage 2, GFR 60-89 ml/min 11/04/2022   Mixed hyperlipidemia 10/10/2022   GAD (generalized anxiety disorder) 10/10/2022   Acquired hypothyroidism 10/10/2022   Hypertension    Type 2 diabetes mellitus with diabetic chronic kidney  disease (HCC)    Primary insomnia 09/18/2022   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   DOE (dyspnea on exertion) 12/21/2013    Benzodiazepine dependence, episodic (HCC) 12/23/2011   DDD (degenerative disc disease), cervical 12/23/2011   DDD (degenerative disc disease), lumbosacral 12/23/2011   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   Fibromyalgia 02/11/2011   Past Medical History:  Diagnosis Date   Abdominal pain 02/11/2011   Abnormal CT of the chest 02/04/2014   CT chest 08/2013:  atx in RML and lingula  CT angio 12/2013:  No PE, RML scarring, abnormal density in lingula  (done at [facility])     Allergy     Anxiety    Arthritis    Asthma    Bruises easily    Chronic chest wall pain    Chronic pain    Cough    Depression    Diabetes mellitus without complication (HCC)    Dyspnea    chronic   Dysuria 09/18/2022   Encounter for therapeutic drug monitoring 02/11/2011   Fibromyalgia    Functional movement disorder    GERD (gastroesophageal reflux disease)    Headache    Hearing loss    High cholesterol    History of hiatal hernia    Hypertension    Hypothyroidism    Kidney disease    stage 3   Leg swelling    both   Nausea 02/11/2011   Nausea and vomiting 04/01/2013   Pain management    PONV (postoperative nausea and vomiting)    Sebaceous cyst of breast, right subaereolar 10/30/2010   Septic shock (HCC) 01/19/2023   SMAS (superior mesenteric artery syndrome) (HCC)    Thyroid  disease    TIA (transient ischemic attack)    Tic disorder    TMJ (dislocation of temporomandibular joint)    Transient cerebral ischemia    Type 2 diabetes mellitus with complications (HCC)    Past Surgical History:  Procedure Laterality Date   AORTA - SUPERIOR MESENTERIC AND AORTA - RENAL ARTERY BYPASS GRAFT     APPENDECTOMY     BREAST CYST EXCISION Right    BREAST EXCISIONAL BIOPSY     CHOLECYSTECTOMY     cyst removed     FLEXIBLE BRONCHOSCOPY Bilateral 06/27/2023   Procedure: BRONCHOSCOPY, FLEXIBLE;  Surgeon: Kara Dorn NOVAK, MD;  Location: [facility] ENDOSCOPY;  Service: Pulmonary;  Laterality: Bilateral;    HERNIA REPAIR     OOPHORECTOMY     bilateral   TEE WITHOUT CARDIOVERSION N/A 06/18/2012   Procedure: TRANSESOPHAGEAL ECHOCARDIOGRAM (TEE);  Surgeon: Jerel Balding, MD;  Location: [facility] ENDOSCOPY;  Service: Cardiovascular;  Laterality: N/A;   TOTAL ABDOMINAL HYSTERECTOMY     Family History  Problem Relation Age of Onset   Asthma Father    Breast cancer Sister    Hypertension Sister    Parkinson's disease Maternal Aunt    Pulmonary fibrosis Maternal Grandmother    Emphysema Maternal Grandmother    Heart disease Maternal Grandmother    Breast cancer Maternal Grandmother    Asthma Maternal Grandfather    Heart disease Maternal Grandfather    Cancer Paternal Grandmother        breast   Heart disease Paternal Grandmother    Breast cancer Paternal Grandmother    Asthma Paternal Grandmother    Leukemia Paternal Grandfather    Heart disease Paternal Grandfather    Clotting disorder Paternal Grandfather    Outpatient Medications Prior to Visit  Medication Sig Dispense Refill  albuterol  (VENTOLIN  HFA) 108 (90 Base) MCG/ACT inhaler TAKE 2 PUFFS BY MOUTH EVERY 6 HOURS AS NEEDED FOR WHEEZE OR SHORTNESS OF BREATH 8.5 each 2   ARIPiprazole  (ABILIFY ) 5 MG tablet Take 5 mg by mouth daily.     atorvastatin  (LIPITOR) 20 MG tablet Take 1 tablet (20 mg total) by mouth at bedtime. 90 tablet 3   diazepam  (VALIUM ) 5 MG tablet TAKE 1 TABLET (5 MG TOTAL) BY MOUTH TWICE A DAY AS NEEDED FOR ANXIETY (Patient taking differently: Take 5 mg by mouth daily as needed for anxiety.) 30 tablet 3   esomeprazole (NEXIUM) 20 MG capsule Take 20 mg by mouth daily at 12 noon.     furosemide  (LASIX ) 20 MG tablet Take 1 tablet (20 mg total) by mouth daily for 5 days. (Patient taking differently: Take 20 mg by mouth daily as needed for fluid or edema.) 30 tablet 3   gabapentin  (NEURONTIN ) 600 MG tablet TAKE 1 TABLET BY MOUTH AT BEDTIME. (Patient taking differently: Take 600 mg by mouth daily.) 60 tablet 3    Glycopyrrolate-Formoterol (BEVESPI  AEROSPHERE) 9-4.8 MCG/ACT AERO Inhale 2 puffs into the lungs 2 (two) times daily. 10.7 g 5   ipratropium-albuterol  (DUONEB) 0.5-2.5 (3) MG/3ML SOLN Take 3 mLs by nebulization every 4 (four) hours as needed (shortness of breath, wheezing, or cough). 360 mL 2   levothyroxine  (SYNTHROID ) 100 MCG tablet Take 1 tablet (100 mcg total) by mouth daily at 6 (six) AM. 90 tablet 0   promethazine  (PHENERGAN ) 25 MG tablet Take 1 tablet (25 mg total) by mouth every 8 (eight) hours as needed for nausea or vomiting. 30 tablet 3   traZODone  (DESYREL ) 100 MG tablet Take 100 mg by mouth at bedtime.     dicyclomine  (BENTYL ) 10 MG capsule Take 1 capsule (10 mg total) by mouth 4 (four) times daily -  before meals and at bedtime. (Patient taking differently: Take 10 mg by mouth daily as needed for spasms.) 60 capsule 1   predniSONE  (DELTASONE ) 10 MG tablet Take 4 tablets by mouth daily for 2 days THEN take 3 tablets daily for 2 days THEN take 2 tablets daily for 2  days THEN take 1 tablet daily for 1 day. 19 tablet 0   No facility-administered medications prior to visit.   Allergies  Allergen Reactions   Latex Anaphylaxis and Swelling   Codeine Nausea And Vomiting    GI Upset (intolerance), Vomiting (intolerance)   Ibuprofen Other (See Comments)    GI upset, burning sensation   Oxycodone  Nausea And Vomiting    Pre-medication with phenergan  needed.   Tape Itching and Rash    Please use paper tape only.     ROS: A complete ROS was performed with pertinent positives/negatives noted in the HPI. The remainder of the ROS are negative.    Objective:   Today's Vitals   09/24/23 1522 09/24/23 1602  BP: (!) 145/100 (!) 140/98  Pulse: 95   SpO2: 96%   Weight: 151 lb 3.2 oz (68.6 kg)   Height: 5' 4 (1.626 m)     Physical Exam   GENERAL: Well-appearing, in NAD. Well nourished.  SKIN: Pink, warm and dry.  Head: Normocephalic. NECK: Trachea midline. Full ROM w/o pain or  tenderness.  THROAT: Uvula midline. Oropharynx clear. . Mucous membranes pink and moist.  RESPIRATORY: Chest wall symmetrical. Respirations even and non-labored. Breath sounds with rales to auscultation bilaterally.  Cough is congested, nonproductive in exam today. CARDIAC: S1, S2 present, regular rate  and rhythm without murmur or gallops. Peripheral pulses 2+ bilaterally.  MSK: Muscle tone and strength appropriate for age. Joints w/o tenderness, redness, or swelling.  EXTREMITIES: Without clubbing, cyanosis, or edema.  NEUROLOGIC: No motor or sensory deficits. Steady, even gait. C2-C12 intact.  PSYCH/MENTAL STATUS: Alert, oriented x 3. Cooperative, appropriate mood and affect.  Patient's judgment is intact and coherent.  She is much more engaged with husband in room appropriately and with PCP  EKG: 09/24/2023  Vent. rate 84 BPM PR interval 148 ms QRS duration 70 ms QT/QTcB 338/399 ms P-R-T axes 30 35 38  Normal sinus rhythm Normal ECG When compared with ECG of 02-Sep-2023 15:16, Vent. rate has increased BY 30 BPM Nonspecific T wave abnormality no longer evident in Lateral leads QT has lengthened     Assessment & Plan:  1. Hospital discharge follow-up (Primary) Reviewed course of hospitalization with patient and she affirms she has completed doxycycline  and steroid taper prescribed patient.  She reports she is compliant with all medications and medication list reviewed by PCP to verify.  She will continue with pulmonology and ENT follow-up as scheduled.  2. Pneumonia due to methicillin resistant Staphylococcus aureus (MRSA), unspecified laterality, unspecified part of lung (HCC) Recurrent.  Patient has completed course of antibiotics and steroid taper.  Will continue management by pulmonology and will notify Dr. Kara of upcoming appointment post hospitalization.  Patient will continue with inhalers as prescribed, using nebs as needed and recommend continuing guaifenesin  if needed for  congestion.  Patient aware to reach out to PCP if symptoms return or occur prior to next appointment.  Discussed benefits of pneumonia vaccine and upcoming flu vaccine season and patient will consider obtaining.  3. Primary hypertension Currently uncontrolled.  EKG unremarkable reviewed by PCP.  Will start amlodipine  5 mg daily.  Patient will obtain home blood pressure cuff to monitor and log BP and bring her log with her to her next appointment.  Will check BP in 2 to 3 weeks.  Patient will reach out to PCP if no improvement prior to next appointment.  4. Acquired hypothyroidism Patient is compliant with levothyroxine  100 mcg daily.  Will check TSH in 3 weeks at next appointment.  Discussed importance of maintaining daily medications.  5. Paranoid behavior (HCC) Currently resolved.  Patient's psychological behavior is unremarkable in exam room today.  This may have been due to noncompliance with medications previously.  Recommend she continue with her psychiatry appointment on 09/26/2023 and continue on current medications.  Safety plan reviewed with patient.    Meds ordered this encounter  Medications   amLODipine  (NORVASC ) 5 MG tablet    Sig: Take 1 tablet (5 mg total) by mouth daily.    Dispense:  30 tablet    Refill:  3    Supervising Provider:   DE PERU, RAYMOND J [[id number]]   Lab Orders  No laboratory test(s) ordered today    Return in about 22 days (around 10/16/2023) for Follow up HTN, Thyroid , and f/u pneumonia .    Patient to reach out to office if new, worrisome, or unresolved symptoms arise or if no improvement in patient's condition. Patient verbalized understanding and is agreeable to treatment plan. All questions answered to patient's satisfaction.    Thersia Schuyler Stark, [place]

## 2023-09-30 ENCOUNTER — Telehealth (INDEPENDENT_AMBULATORY_CARE_PROVIDER_SITE_OTHER): Payer: Self-pay | Admitting: Otolaryngology

## 2023-09-30 NOTE — Telephone Encounter (Signed)
 Tried to call to r/s 9/17 appointment

## 2023-10-02 ENCOUNTER — Telehealth (HOSPITAL_BASED_OUTPATIENT_CLINIC_OR_DEPARTMENT_OTHER): Payer: Self-pay | Admitting: *Deleted

## 2023-10-02 NOTE — Telephone Encounter (Signed)
 Alexis, please advise on this for pt.

## 2023-10-02 NOTE — Telephone Encounter (Signed)
 Called and spoke with pt who states she has had chills off and on for the past 2 weeks. No fever as temp today was 98.5. asked pt if she had a way she could take a covid/flu test and pt said she didn't have one but would send someone out to get one.  Pt wants to know if it will be okay for her to attend her son's baby shower in 3 days. Still waiting to see if pt is able to take a test.

## 2023-10-02 NOTE — Telephone Encounter (Signed)
 Copied from CRM #[id number]. Topic: Clinical - Medical Advice >> Oct 02, 2023 10:23 AM Anairis L wrote: Reason for CRM: Sabrina Roberson is still having chills and cough has not gone away. No fever in the last 48 hrs. She would like your opinion if she should attend her sons baby shower on Sunday. She wouldn't want to expose anyone. She is requesting a call back .

## 2023-10-04 ENCOUNTER — Other Ambulatory Visit (HOSPITAL_BASED_OUTPATIENT_CLINIC_OR_DEPARTMENT_OTHER): Payer: Self-pay | Admitting: Family Medicine

## 2023-10-04 DIAGNOSIS — F411 Generalized anxiety disorder: Secondary | ICD-10-CM

## 2023-10-10 ENCOUNTER — Institutional Professional Consult (permissible substitution) (INDEPENDENT_AMBULATORY_CARE_PROVIDER_SITE_OTHER): Admitting: Otolaryngology

## 2023-10-15 ENCOUNTER — Institutional Professional Consult (permissible substitution) (INDEPENDENT_AMBULATORY_CARE_PROVIDER_SITE_OTHER): Admitting: Otolaryngology

## 2023-10-16 ENCOUNTER — Encounter (HOSPITAL_BASED_OUTPATIENT_CLINIC_OR_DEPARTMENT_OTHER): Payer: Self-pay | Admitting: Family Medicine

## 2023-10-16 ENCOUNTER — Ambulatory Visit (INDEPENDENT_AMBULATORY_CARE_PROVIDER_SITE_OTHER): Admitting: Family Medicine

## 2023-10-16 VITALS — BP 139/76 | HR 78 | Ht 64.0 in | Wt 152.0 lb

## 2023-10-16 DIAGNOSIS — J15212 Pneumonia due to Methicillin resistant Staphylococcus aureus: Secondary | ICD-10-CM | POA: Diagnosis not present

## 2023-10-16 DIAGNOSIS — D649 Anemia, unspecified: Secondary | ICD-10-CM | POA: Diagnosis not present

## 2023-10-16 DIAGNOSIS — R42 Dizziness and giddiness: Secondary | ICD-10-CM

## 2023-10-16 DIAGNOSIS — E039 Hypothyroidism, unspecified: Secondary | ICD-10-CM

## 2023-10-16 DIAGNOSIS — T50905A Adverse effect of unspecified drugs, medicaments and biological substances, initial encounter: Secondary | ICD-10-CM

## 2023-10-16 DIAGNOSIS — I10 Essential (primary) hypertension: Secondary | ICD-10-CM

## 2023-10-16 DIAGNOSIS — G259 Extrapyramidal and movement disorder, unspecified: Secondary | ICD-10-CM

## 2023-10-16 MED ORDER — IPRATROPIUM-ALBUTEROL 0.5-2.5 (3) MG/3ML IN SOLN
3.0000 mL | RESPIRATORY_TRACT | 5 refills | Status: DC | PRN
Start: 2023-10-16 — End: 2023-11-20

## 2023-10-16 MED ORDER — TRAZODONE HCL 100 MG PO TABS: 50.0000 mg | ORAL_TABLET | Freq: Every day | ORAL | Status: DC

## 2023-10-16 NOTE — Patient Instructions (Addendum)
 Cut Trazodone  down to 50mg  (1/2 tablet) nightly.   Please call Psychiatry regarding excessive movements in your hands (EPS effect) due to Abilify .   Switch from ibuprofen to Tylenol  for pain.

## 2023-10-16 NOTE — Progress Notes (Signed)
 Subjective:   Sabrina Roberson 07-28-1970 10/16/2023  Chief Complaint  Patient presents with   Medical Management of Chronic Issues    Patient is here today for a follow up. Denies any concerns for today's visit.     HPI: Sabrina Roberson presents today for re-assessment and management of chronic medical conditions.   HYPERTENSION: [name] presents for the medical management of hypertension.  Patient's current hypertension medication regimen is: Amlodipine  5mg  . (Patient stopped taking due to uncontrolled spasms of hands)  Patient is not currently taking prescribed medications for HTN.  Patient is  regularly keeping a check on BP at home. She reports average BP of 130/70-90.  Adhering to low sodium diet: Yes Denies headache, dizziness, CP, SHOB, vision changes.   BP Readings from Last 3 Encounters:  10/16/23 139/76  09/24/23 (!) 140/98  09/08/23 112/67   HYPOTHYROIDISM: Patient presents for the medical management of Hypothyroidism. Current medication: Levothyroxine  100mcg daily Patient compliant with medication regimen: yes  Fatigue: no Cold intolerance: no Weight gain/loss: no Constipation: no Lower extremity edema: no Palpitations: no Hoarseness: no Neck Pain/Compression: no Difficulty Swallowing: no  Lab Results  Component Value Date   TSH 0.170 (L) 10/16/2023    PNEUMONIA:  She is doing her inhalers as prescribed. She is not using her neb solutions as she ran out. She is using her incentive spirometer as directed daily and has follow up with Dr. Kara next week. Reports ongoing mucous production that has improved but is still bothersome during the day.    PSYCHIATRY:  Patient is followed by Psychiatry Saddie Ha, PA at [facility] for MDD and concern of bipolar disorder. She had significant paranoid thought process in August and established care.  Patient states she did see Psychiatry in August and medications have not changed. Reports her  psychiatrist has diagnosed her with PTSD and MDD. She states she has noticed some increased triggers changing her mood and decreased appetite due to PTSD and depression. She has found increase of extra movements of her hands causing spasm and uncontrolled movements at times since starting Abilify . She states she will reach out to discuss her Abilify . Denies SI/HI.   Patient states she was having episodes in the past several months of feeling drunk when waking up in the middle of night. She is taking trazodone  100mg  at night. She denies issues with sleeping at this time. Denies symptoms occurring during the day.   IDA:  [name] presents for the medical management of Anemia.  Current medication :  Diet controlled.  Denies bloody stools, hematuria, excessive fatigue, palpitations, pica.  Patient is not seeing hematology for management.     The following portions of the patient's history were reviewed and updated as appropriate: past medical history, past surgical history, family history, social history, allergies, medications, and problem list.   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   Pneumonia 09/02/2023   Severe episode of recurrent major depressive disorder, with psychotic features (HCC) 07/08/2023   Lymphadenopathy, axillary 07/08/2023   Recurrent pneumonia 06/27/2023   Bronchiectasis without complication (HCC) 06/10/2023   Pneumonia due to methicillin resistant Staphylococcus aureus (MRSA) (HCC) 04/03/2023   Physical deconditioning 02/17/2023   Bilateral lower extremity edema 12/03/2022   Bilateral carotid bruits 11/04/2022   CKD (chronic kidney disease) stage 2, GFR 60-89 ml/min 11/04/2022   Mixed hyperlipidemia 10/10/2022   GAD (generalized anxiety disorder)  10/10/2022   Acquired hypothyroidism 10/10/2022   Hypertension    Type 2 diabetes mellitus with diabetic chronic kidney disease (HCC)     Primary insomnia 09/18/2022   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   DOE (dyspnea on exertion) 12/21/2013   Benzodiazepine dependence, episodic (HCC) 12/23/2011   DDD (degenerative disc disease), cervical 12/23/2011   DDD (degenerative disc disease), lumbosacral 12/23/2011   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   Fibromyalgia 02/11/2011   Past Medical History:  Diagnosis Date   Abdominal pain 02/11/2011   Abnormal CT of the chest 02/04/2014   CT chest 08/2013:  atx in RML and lingula  CT angio 12/2013:  No PE, RML scarring, abnormal density in lingula  (done at [facility])     Allergy     AMS (altered mental status) 09/03/2023   Anxiety    Arthritis    Asthma    Bruises easily    Chronic chest wall pain    Chronic pain    Cough    Depression    Diabetes mellitus without complication (HCC)    Dyspnea    chronic   Dysuria 09/18/2022   Encounter for therapeutic drug monitoring 02/11/2011   Fibromyalgia    Functional movement disorder    GERD (gastroesophageal reflux disease)    Headache    Hearing loss    High cholesterol    History of hiatal hernia    Hypertension    Hypothyroidism    Kidney disease    stage 3   Leg swelling    both   Localized swelling of both lower legs 05/29/2023   Nausea 02/11/2011   Nausea and vomiting 04/01/2013   Pain management    Paranoid behavior (HCC) 07/08/2023   PONV (postoperative nausea and vomiting)    Sebaceous cyst of breast, right subaereolar 10/30/2010   Septic shock (HCC) 01/19/2023   SMAS (superior mesenteric artery syndrome) (HCC)    Thyroid  disease    TIA (transient ischemic attack)    Tic disorder    TMJ (dislocation of temporomandibular joint)    Transient cerebral ischemia    Type 2 diabetes mellitus with complications (HCC)    Past Surgical History:  Procedure Laterality Date   AORTA - SUPERIOR MESENTERIC AND AORTA - RENAL ARTERY BYPASS GRAFT     APPENDECTOMY     BREAST CYST EXCISION Right     BREAST EXCISIONAL BIOPSY     CHOLECYSTECTOMY     cyst removed     FLEXIBLE BRONCHOSCOPY Bilateral 06/27/2023   Procedure: BRONCHOSCOPY, FLEXIBLE;  Surgeon: Kara Dorn NOVAK, MD;  Location: [facility] ENDOSCOPY;  Service: Pulmonary;  Laterality: Bilateral;   HERNIA REPAIR     OOPHORECTOMY     bilateral   TEE WITHOUT CARDIOVERSION N/A 06/18/2012   Procedure: TRANSESOPHAGEAL ECHOCARDIOGRAM (TEE);  Surgeon: Jerel Balding, MD;  Location: [facility] ENDOSCOPY;  Service: Cardiovascular;  Laterality: N/A;   TOTAL ABDOMINAL HYSTERECTOMY     Family History  Problem Relation Age of Onset   Asthma Father    Breast cancer Sister    Hypertension Sister    Parkinson's disease Maternal Aunt    Pulmonary fibrosis Maternal Grandmother    Emphysema Maternal Grandmother    Heart disease Maternal Grandmother    Breast cancer Maternal Grandmother    Asthma Maternal Grandfather    Heart disease Maternal Grandfather    Cancer Paternal Grandmother        breast   Heart disease Paternal Grandmother    Breast cancer  Paternal Grandmother    Asthma Paternal Grandmother    Leukemia Paternal Grandfather    Heart disease Paternal Grandfather    Clotting disorder Paternal Grandfather    Outpatient Medications Prior to Visit  Medication Sig Dispense Refill   albuterol  (VENTOLIN  HFA) 108 (90 Base) MCG/ACT inhaler TAKE 2 PUFFS BY MOUTH EVERY 6 HOURS AS NEEDED FOR WHEEZE OR SHORTNESS OF BREATH 8.5 each 2   ARIPiprazole  (ABILIFY ) 5 MG tablet Take 5 mg by mouth daily.     atorvastatin  (LIPITOR) 20 MG tablet Take 1 tablet (20 mg total) by mouth at bedtime. 90 tablet 3   diazepam  (VALIUM ) 5 MG tablet TAKE 1 TABLET (5 MG TOTAL) BY MOUTH TWICE A DAY AS NEEDED FOR ANXIETY 30 tablet 2   esomeprazole (NEXIUM) 20 MG capsule Take 20 mg by mouth daily at 12 noon.     furosemide  (LASIX ) 20 MG tablet Take 1 tablet (20 mg total) by mouth daily for 5 days. (Patient taking differently: Take 20 mg by mouth daily as needed for fluid or edema.)  30 tablet 3   gabapentin  (NEURONTIN ) 600 MG tablet TAKE 1 TABLET BY MOUTH AT BEDTIME. (Patient taking differently: Take 600 mg by mouth daily.) 60 tablet 3   Glycopyrrolate-Formoterol (BEVESPI  AEROSPHERE) 9-4.8 MCG/ACT AERO Inhale 2 puffs into the lungs 2 (two) times daily. 10.7 g 5   levothyroxine  (SYNTHROID ) 100 MCG tablet Take 1 tablet (100 mcg total) by mouth daily at 6 (six) AM. 90 tablet 0   promethazine  (PHENERGAN ) 25 MG tablet Take 1 tablet (25 mg total) by mouth every 8 (eight) hours as needed for nausea or vomiting. 30 tablet 3   ipratropium-albuterol  (DUONEB) 0.5-2.5 (3) MG/3ML SOLN Take 3 mLs by nebulization every 4 (four) hours as needed (shortness of breath, wheezing, or cough). 360 mL 2   traZODone  (DESYREL ) 100 MG tablet Take 100 mg by mouth at bedtime.     amLODipine  (NORVASC ) 5 MG tablet Take 1 tablet (5 mg total) by mouth daily. (Patient not taking: Reported on 10/16/2023) 30 tablet 3   No facility-administered medications prior to visit.   Allergies  Allergen Reactions   Latex Anaphylaxis and Swelling   Codeine Nausea And Vomiting    GI Upset (intolerance), Vomiting (intolerance)   Ibuprofen Other (See Comments)    GI upset, burning sensation   Oxycodone  Nausea And Vomiting    Pre-medication with phenergan  needed.   Tape Itching and Rash    Please use paper tape only.     ROS: A complete ROS was performed with pertinent positives/negatives noted in the HPI. The remainder of the ROS are negative.    Objective:   Today's Vitals   10/16/23 1038  BP: 139/76  Pulse: 78  SpO2: 96%  Weight: 152 lb (68.9 kg)  Height: 5' 4 (1.626 m)    Physical Exam   GENERAL: Well-appearing, in NAD. Well nourished.  SKIN: Pink, warm and dry. No rash, lesion, ulceration, or ecchymoses.  Head: Normocephalic. NECK: Trachea midline. Full ROM w/o pain or tenderness.  RESPIRATORY: Chest wall symmetrical. Respirations even and non-labored. Breath sounds diminished with mild  rhonchit to auscultation bilaterally. Cough is congested and productive. CARDIAC: S1, S2 present, regular rate and rhythm without murmur or gallops. Peripheral pulses 2+ bilaterally.  MSK: Muscle tone and strength appropriate for age. Joints w/o tenderness, redness, or swelling.  EXTREMITIES: Without clubbing, cyanosis, or edema.  NEUROLOGIC: No sensory deficits. Mild tremor present to bilateral hands. Steady, even gait. C2-C12 intact.  PSYCH/MENTAL STATUS: Alert, oriented x 3. Cooperative, appropriate mood and affect.   Health Maintenance Due  Topic Date Due   OPHTHALMOLOGY EXAM  Never done   Hepatitis C Screening  Never done   Pneumococcal Vaccine: 50+ Years (1 of 2 - PCV) Never done   Hepatitis B Vaccines 19-59 Average Risk (1 of 3 - 19+ 3-dose series) Never done   DEXA SCAN  02/08/2017   Zoster Vaccines- Shingrix (1 of 2) Never done   COVID-19 Vaccine (1 - 2024-25 season) Never done   FOOT EXAM  10/10/2023    The 10-year ASCVD risk score (Arnett DK, et al., 2019) is: 10.5%     Assessment & Plan:  1. Pneumonia due to methicillin resistant Staphylococcus aureus (MRSA), unspecified laterality, unspecified part of lung (HCC) (Primary) Nebulizer solution refilled for patient. Recommend she continue using her inhalers as directed and incetive spirometer. She is scheduled with Pulmonology next week for follow up.  - ipratropium-albuterol  (DUONEB) 0.5-2.5 (3) MG/3ML SOLN; Take 3 mLs by nebulization every 4 (four) hours as needed (shortness of breath, wheezing, or cough).  Dispense: 360 mL; Refill: 5  2. Anemia, unspecified type Will recheck CBC, Iron panel with labs today given prior hx of anemia.  - CBC with Differential/Platelet - Iron, TIBC and Ferritin Panel  3. Acquired hypothyroidism Stable previously. Will check TSH with patient restarting her levothyroxine  as directed. Discussed importance of medication adherence.  - TSH  4. Primary hypertension Patient directed to restart  amlodipine  as this is not likely contributing to EPS effects.   5. Drug-induced dizziness Dizziness likely due to trazodone  taken at bedtime. Recommend she decrease her trazodone  to 50mg  nightly and notify PCP if no improvement. Discussed safety of getting up at night, using support.   6. Extrapyramidal movements present Recommend patient reach out to Psychiatry regarding EPS effects possibly related to Abilify . Pt verbalized understanding. Safety plan reviewed.    Meds ordered this encounter  Medications   ipratropium-albuterol  (DUONEB) 0.5-2.5 (3) MG/3ML SOLN    Sig: Take 3 mLs by nebulization every 4 (four) hours as needed (shortness of breath, wheezing, or cough).    Dispense:  360 mL    Refill:  5    Supervising Provider:   DE PERU, RAYMOND J [[id number]]   traZODone  (DESYREL ) 100 MG tablet    Sig: Take 0.5 tablets (50 mg total) by mouth at bedtime.    Supervising Provider:   DE PERU, RAYMOND J [[id number]]   Lab Orders         TSH         CBC with Differential/Platelet         Iron, TIBC and Ferritin Panel     No images are attached to the encounter or orders placed in the encounter.  Return in about 3 months (around 01/15/2024) for ANNUAL PHYSICAL, Follow up Hypothyroidism, Anemia , DM.    Patient to reach out to office if new, worrisome, or unresolved symptoms arise or if no improvement in patient's condition. Patient verbalized understanding and is agreeable to treatment plan. All questions answered to patient's satisfaction.    Thersia Schuyler Stark, [place]

## 2023-10-17 LAB — CBC WITH DIFFERENTIAL/PLATELET
Basophils Absolute: 0 x10E3/uL (ref 0.0–0.2)
Basos: 0 %
EOS (ABSOLUTE): 0.1 x10E3/uL (ref 0.0–0.4)
Eos: 1 %
Hematocrit: 37.6 % (ref 34.0–46.6)
Hemoglobin: 11.6 g/dL (ref 11.1–15.9)
Immature Grans (Abs): 0 x10E3/uL (ref 0.0–0.1)
Immature Granulocytes: 0 %
Lymphocytes Absolute: 1.6 x10E3/uL (ref 0.7–3.1)
Lymphs: 23 %
MCH: 28.9 pg (ref 26.6–33.0)
MCHC: 30.9 g/dL — ABNORMAL LOW (ref 31.5–35.7)
MCV: 94 fL (ref 79–97)
Monocytes Absolute: 0.4 x10E3/uL (ref 0.1–0.9)
Monocytes: 5 %
Neutrophils Absolute: 4.8 x10E3/uL (ref 1.4–7.0)
Neutrophils: 71 %
Platelets: 330 x10E3/uL (ref 150–450)
RBC: 4.02 x10E6/uL (ref 3.77–5.28)
RDW: 13.5 % (ref 11.7–15.4)
WBC: 6.8 x10E3/uL (ref 3.4–10.8)

## 2023-10-17 LAB — IRON,TIBC AND FERRITIN PANEL
Ferritin: 24 ng/mL (ref 15–150)
Iron Saturation: 22 % (ref 15–55)
Iron: 74 ug/dL (ref 27–159)
Total Iron Binding Capacity: 331 ug/dL (ref 250–450)
UIBC: 257 ug/dL (ref 131–425)

## 2023-10-17 LAB — TSH: TSH: 0.17 u[IU]/mL — ABNORMAL LOW (ref 0.450–4.500)

## 2023-10-20 ENCOUNTER — Ambulatory Visit (HOSPITAL_BASED_OUTPATIENT_CLINIC_OR_DEPARTMENT_OTHER): Payer: Self-pay | Admitting: Family Medicine

## 2023-10-20 ENCOUNTER — Encounter (HOSPITAL_BASED_OUTPATIENT_CLINIC_OR_DEPARTMENT_OTHER): Payer: Self-pay | Admitting: Family Medicine

## 2023-10-20 DIAGNOSIS — E039 Hypothyroidism, unspecified: Secondary | ICD-10-CM

## 2023-10-20 MED ORDER — LEVOTHYROXINE SODIUM 88 MCG PO TABS: 88.0000 ug | ORAL_TABLET | Freq: Every day | ORAL | 2 refills | Status: AC

## 2023-10-20 NOTE — Progress Notes (Signed)
 Please schedule patient for lab only appointment in 6 weeks for TSH.   Hi Sabrina Roberson, Your blood counts have returned to baseline.  Please continue good diet with iron rich sources of food.  Your iron studies are stable.  However, your thyroid  function TSH level is low.  This indicates too much thyroid  hormone (levothyroxine ) present.  I have adjusted your levothyroxine  and sent in levothyroxine  88 mcg.  Please take this instead of the levothyroxine  100 mcg tablets.  We will need to recheck this with lab work in 6 weeks.  I will have Damien schedule you for a lab appointment.  You have questions please let me know

## 2023-10-22 ENCOUNTER — Other Ambulatory Visit

## 2023-10-22 ENCOUNTER — Other Ambulatory Visit: Payer: Self-pay

## 2023-10-22 ENCOUNTER — Ambulatory Visit: Admitting: Pulmonary Disease

## 2023-10-22 ENCOUNTER — Encounter: Payer: Self-pay | Admitting: Pulmonary Disease

## 2023-10-22 VITALS — BP 129/83 | HR 56 | Ht 64.0 in | Wt 150.4 lb

## 2023-10-22 DIAGNOSIS — Z87891 Personal history of nicotine dependence: Secondary | ICD-10-CM

## 2023-10-22 DIAGNOSIS — J189 Pneumonia, unspecified organism: Secondary | ICD-10-CM

## 2023-10-22 DIAGNOSIS — J479 Bronchiectasis, uncomplicated: Secondary | ICD-10-CM

## 2023-10-22 DIAGNOSIS — Z8614 Personal history of Methicillin resistant Staphylococcus aureus infection: Secondary | ICD-10-CM

## 2023-10-22 DIAGNOSIS — Z8701 Personal history of pneumonia (recurrent): Secondary | ICD-10-CM | POA: Diagnosis not present

## 2023-10-22 DIAGNOSIS — J9819 Other pulmonary collapse: Secondary | ICD-10-CM

## 2023-10-22 DIAGNOSIS — R1310 Dysphagia, unspecified: Secondary | ICD-10-CM | POA: Diagnosis not present

## 2023-10-22 DIAGNOSIS — A498 Other bacterial infections of unspecified site: Secondary | ICD-10-CM

## 2023-10-22 MED ORDER — LEVOFLOXACIN 750 MG PO TABS: 750.0000 mg | ORAL_TABLET | Freq: Every day | ORAL | 0 refills | Status: DC

## 2023-10-22 NOTE — Patient Instructions (Signed)
 Continue bevespi  inhaler 2 puffs twice daily  Airway Clearance: - Use nebulizer treatments twice daily followed by flutter valve to help clear out the mucous in your lungs  Use albuterol  inhaler 1-2 puffs every 4-6 hours as needed  Start levaquin  1 tab daily for 10 days  Check sputum cultures today  Will discuss with my colleagues whether to bring you back for repeat bronchoscopy, will give you a call regarding this.  Follow up in 3 months

## 2023-10-22 NOTE — H&P (View-Only) (Signed)
 Synopsis: hx or MRSA pneumonia  Subjective:   PATIENT ID: Sabrina Roberson GENDER: female DOB: 09/12/1970, MRN: [id number]  HPI  Chief Complaint  Patient presents with   Medical Management of Chronic Issues    PT states sputum / green -yellowish / cough    Sabrina Roberson is a 53 year old woman, former smoker with DMII, GERD, depression/anxiety, hypertension, CKD and asthma who returns to pulmonary clinic for recurrent pneumonias.  OV 06/10/23 She was last seen by Izetta Rouleau, NP 03/17/23. Sputum cultures have been grown MRSA since 01/20/23. Most recent sputum sample collected 03/17/23. She was placed on a 14 day course of linezolid  starting on 03/20/23.   She continues to have productive cough with green sputum. She experiences cold chills but no fever. Recent dental work has led to difficulty swallowing, complicating chewing and swallowing.  She quit smoking 12/2022. She has been on disability.  OV 07/14/23 She has a persistent cough with thick, brown, greenish-yellow sputum, worsened by heat and humidity, and a sensation of airway closure during coughing. A crackling sensation in her chest is present. She experiences chills but is unsure about fever. Her voice becomes raspy and stops during speech, particularly when ordering. She has not smoked since December after a hospitalization. Her family history includes emphysema on her mother's side.  She uses an albuterol  inhaler as needed but has not used her hypertonic saline treatment since starting antibiotics. She has not used the Anoro inhaler due to insurance issues. She previously used prednisone  but experienced significant jitteriness. Swelling in her arm has improved with antibiotics, though some soreness persists.  OV 10/22/23 She continues to experience chills without fever and often feels cold. During her recent hospitalization for pneumonia, cultures revealed MRSA and stenotrophomonas. She completed a five-day course of antibiotics at  home. She denies hemoptysis but has green and yellow sputum production. She uses a flutter valve and has resumed nebulizer treatments. She is also using a Bevespi  inhaler.  Previous bronchoscopy did not reveal significant abnormalities but narrowing of RML entrance. She experiences a sensation of things getting stuck in her throat when swallowing, which sometimes causes her to feel 'choked up'. She has been on a ventilator twice in the past, with the second time being a prolonged process.  History and imaging reviewed with colleagues, concern for right middle lobe syndrome vs immunodeficiency vs dysphagia/aspiration risk are included as differentials.  Past Medical History:  Diagnosis Date   Abdominal pain 02/11/2011   Abnormal CT of the chest 02/04/2014   CT chest 08/2013:  atx in RML and lingula  CT angio 12/2013:  No PE, RML scarring, abnormal density in lingula  ([facility] at [facility])     Allergy     AMS (altered mental status) 09/03/2023   Anxiety    Arthritis    Asthma    Bruises easily    Chronic chest wall pain    Chronic pain    Cough    Depression    Diabetes mellitus without complication (HCC)    Dyspnea    chronic   Dysuria 09/18/2022   Encounter for therapeutic drug monitoring 02/11/2011   Fibromyalgia    Functional movement disorder    GERD (gastroesophageal reflux disease)    Headache    Hearing loss    High cholesterol    History of hiatal hernia    Hypertension    Hypothyroidism    Kidney disease    stage 3   Leg swelling  both   Localized swelling of both lower legs 05/29/2023   Nausea 02/11/2011   Nausea and vomiting 04/01/2013   Pain management    Paranoid behavior (HCC) 07/08/2023   PONV (postoperative nausea and vomiting)    Sebaceous cyst of breast, right subaereolar 10/30/2010   Septic shock (HCC) 01/19/2023   SMAS (superior mesenteric artery syndrome)    Thyroid  disease    TIA (transient ischemic attack)    Tic disorder    TMJ (dislocation  of temporomandibular joint)    Transient cerebral ischemia    Type 2 diabetes mellitus with complications (HCC)      Family History  Problem Relation Age of Onset   Asthma Father    Breast cancer Sister    Hypertension Sister    Parkinson's disease Maternal Aunt    Pulmonary fibrosis Maternal Grandmother    Emphysema Maternal Grandmother    Heart disease Maternal Grandmother    Breast cancer Maternal Grandmother    Asthma Maternal Grandfather    Heart disease Maternal Grandfather    Cancer Paternal Grandmother        breast   Heart disease Paternal Grandmother    Breast cancer Paternal Grandmother    Asthma Paternal Grandmother    Leukemia Paternal Grandfather    Heart disease Paternal Grandfather    Clotting disorder Paternal Grandfather      Social History   Socioeconomic History   Marital status: Married    Spouse name: Not on file   Number of children: 2   Years of education: Not on file   Highest education level: 12th grade  Occupational History   Occupation: disabled  Tobacco Use   Smoking status: Former    Current packs/day: 0.00    Average packs/day: 0.5 packs/day for 15.0 years (7.5 ttl pk-yrs)    Types: Cigarettes    Quit date: 01/19/2023    Years since quitting: 0.7   Smokeless tobacco: Never  Vaping Use   Vaping status: Former  Substance and Sexual Activity   Alcohol use: No   Drug use: No   Sexual activity: Not Currently    Partners: Male    Birth control/protection: None  Other Topics Concern   Not on file  Social History Narrative   Not on file   Social Drivers of Health   Financial Resource Strain: Low Risk  (03/02/2023)   Overall Financial Resource Strain (CARDIA)    Difficulty of Paying Living Expenses: Not hard at all  Food Insecurity: No Food Insecurity (09/02/2023)   Hunger Vital Sign    Worried About Running Out of Food in the Last Year: Never true    Ran Out of Food in the Last Year: Never true  Transportation Needs: No  Transportation Needs (09/02/2023)   PRAPARE - Administrator, Civil Service (Medical): No    Lack of Transportation (Non-Medical): No  Physical Activity: Inactive (03/02/2023)   Exercise Vital Sign    Days of Exercise per Week: 0 days    Minutes of Exercise per Session: 20 min  Stress: Stress Concern Present (03/02/2023)   [employer] of Occupational Health - Occupational Stress Questionnaire    Feeling of Stress : Very much  Social Connections: Moderately Isolated (03/04/2023)   Social Connection and Isolation Panel    Frequency of Communication with Friends and Family: More than three times a week    Frequency of Social Gatherings with Friends and Family: More than three times a week    Attends  Religious Services: Never    Active Member of Clubs or Organizations: No    Attends Banker Meetings: Never    Marital Status: Married  Catering manager Violence: Not At Risk (09/03/2023)   Humiliation, Afraid, Rape, and Kick questionnaire    Fear of Current or Ex-Partner: No    Emotionally Abused: No    Physically Abused: No    Sexually Abused: No     Allergies  Allergen Reactions   Latex Anaphylaxis and Swelling   Codeine Nausea And Vomiting    GI Upset (intolerance), Vomiting (intolerance)   Ibuprofen Other (See Comments)    GI upset, burning sensation   Oxycodone  Nausea And Vomiting    Pre-medication with phenergan  needed.   Tape Itching and Rash    Please use paper tape only.     Outpatient Medications Prior to Visit  Medication Sig Dispense Refill   albuterol  (VENTOLIN  HFA) 108 (90 Base) MCG/ACT inhaler TAKE 2 PUFFS BY MOUTH EVERY 6 HOURS AS NEEDED FOR WHEEZE OR SHORTNESS OF BREATH 8.5 each 2   ARIPiprazole  (ABILIFY ) 5 MG tablet Take 5 mg by mouth daily.     atorvastatin  (LIPITOR) 20 MG tablet Take 1 tablet (20 mg total) by mouth at bedtime. 90 tablet 3   diazepam  (VALIUM ) 5 MG tablet TAKE 1 TABLET (5 MG TOTAL) BY MOUTH TWICE A DAY AS NEEDED FOR  ANXIETY 30 tablet 2   esomeprazole (NEXIUM) 20 MG capsule Take 20 mg by mouth daily at 12 noon.     furosemide  (LASIX ) 20 MG tablet Take 1 tablet (20 mg total) by mouth daily for 5 days. (Patient taking differently: Take 20 mg by mouth daily as needed for fluid or edema.) 30 tablet 3   gabapentin  (NEURONTIN ) 600 MG tablet TAKE 1 TABLET BY MOUTH AT BEDTIME. (Patient taking differently: Take 600 mg by mouth daily.) 60 tablet 3   Glycopyrrolate-Formoterol (BEVESPI  AEROSPHERE) 9-4.8 MCG/ACT AERO Inhale 2 puffs into the lungs 2 (two) times daily. 10.7 g 5   ipratropium-albuterol  (DUONEB) 0.5-2.5 (3) MG/3ML SOLN Take 3 mLs by nebulization every 4 (four) hours as needed (shortness of breath, wheezing, or cough). 360 mL 5   levothyroxine  (SYNTHROID ) 88 MCG tablet Take 1 tablet (88 mcg total) by mouth daily at 6 (six) AM. 90 tablet 2   nystatin cream (MYCOSTATIN) Apply topically.     promethazine  (PHENERGAN ) 25 MG tablet Take 1 tablet (25 mg total) by mouth every 8 (eight) hours as needed for nausea or vomiting. 30 tablet 3   traZODone  (DESYREL ) 100 MG tablet Take 0.5 tablets (50 mg total) by mouth at bedtime.     No facility-administered medications prior to visit.    Review of Systems  Constitutional:  Positive for chills. Negative for fever, malaise/fatigue and weight loss.  HENT:  Negative for congestion, sinus pain and sore throat.   Eyes: Negative.   Respiratory:  Positive for cough, sputum production and shortness of breath. Negative for hemoptysis and wheezing.   Cardiovascular:  Negative for chest pain, palpitations, orthopnea, claudication and leg swelling.  Gastrointestinal:  Negative for abdominal pain, heartburn, nausea and vomiting.  Genitourinary: Negative.   Musculoskeletal:  Negative for joint pain and myalgias.  Skin:  Negative for rash.  Neurological:  Negative for weakness.  Endo/Heme/Allergies: Negative.   Psychiatric/Behavioral: Negative.      Objective:   Vitals:    10/22/23 1359  BP: 129/83  Pulse: (!) 56  SpO2: 92%  Weight: 150 lb 6.4 oz (68.2  kg)  Height: 5' 4 (1.626 m)    Physical Exam Constitutional:      General: She is not in acute distress.    Appearance: Normal appearance.  Eyes:     General: No scleral icterus.    Conjunctiva/sclera: Conjunctivae normal.  Cardiovascular:     Rate and Rhythm: Normal rate and regular rhythm.  Pulmonary:     Breath sounds: Rales (scattered on right) present. No wheezing or rhonchi.  Musculoskeletal:     Right lower leg: No edema.     Left lower leg: No edema.  Skin:    General: Skin is warm and dry.  Neurological:     General: No focal deficit present.    CBC    Component Value Date/Time   WBC 6.8 10/16/2023 1119   WBC 7.6 09/08/2023 0524   RBC 4.02 10/16/2023 1119   RBC 3.65 (L) 09/08/2023 0524   HGB 11.6 10/16/2023 1119   HCT 37.6 10/16/2023 1119   PLT 330 10/16/2023 1119   MCV 94 10/16/2023 1119   MCH 28.9 10/16/2023 1119   MCH 29.6 09/08/2023 0524   MCHC 30.9 (L) 10/16/2023 1119   MCHC 31.1 09/08/2023 0524   RDW 13.5 10/16/2023 1119   LYMPHSABS 1.6 10/16/2023 1119   MONOABS 0.3 09/08/2023 0524   EOSABS 0.1 10/16/2023 1119   BASOSABS 0.0 10/16/2023 1119     Chest imaging: CT Chest 09/02/23 1. No pulmonary embolism. 2. Progressive atelectasis in the right middle lobe and new 1.8 cm focal airspace infiltrate centrally in the left lower lobe.  CT Chest 05/16/23 1. New 2.3 x 1.6 cm masslike abnormality in the azygoesophageal recess area, could indicate an area of new right lower lobe pulmonary consolidation, mass or an abnormal lymph node. Per Fleischner Society guidelines, recommend tissue sampling or PET-CT. This the recommendation follows the consensus statement: Guidelines for Management of Incidental Pulmonary Nodules Detected on CT Images: From the Fleischner Society 2017; Radiology 2017; 284:228-243. 2. New patchy ground-glass disease in the medial basal right  lower lobe, probably due to pneumonitis. 3. New loosely clustered nodules in the posterior basal left lower lobe ranging from 3 mm up to 8 mm in size. Given the short interval appearance, these are probably inflammatory nodules but will require follow-up. Non-contrast chest CT at 3-6 months is recommended. If the nodules are stable at time of repeat CT, then future CT at 18-24 months (from today's scan) is considered optional for low-risk patients, but is recommended for high-risk patients. This recommendation follows the consensus statement: Guidelines for Management of Incidental Pulmonary Nodules Detected on CT Images: From the Fleischner Society 2017; Radiology 2017; 284:228-243. 4. Diffuse bronchial thickening, mild bronchiectasis in the lower lobes and in the perihilar areas in the upper lobes. Bilateral bandlike opacities most likely scarring change. 5. Faint scattered ground-glass opacities in areas of previous dense consolidation, most likely representing ground-glass postpneumonic fibrosis. 6. Prominent pulmonary trunk indicating arterial hypertension. 7. Aortic atherosclerosis. 8. Osteopenia.  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 Patient should use of standardized swallowing precautions (small bites/sips via cup edge, sitting upright during PO) and intermittent throat clears. Recommend medications whole in puree.   Follow up in 3 month  Dorn Chill, MD [facility] Office: [phone]   Current Outpatient Medications:    albuterol  (VENTOLIN  HFA) 108 (90 Base) MCG/ACT inhaler, TAKE 2 PUFFS BY MOUTH EVERY 6 HOURS AS NEEDED FOR WHEEZE OR SHORTNESS OF BREATH, Disp: 8.5 each, Rfl: 2   ARIPiprazole  (ABILIFY ) 5 MG tablet, Take 5 mg by mouth daily., Disp: , Rfl:    atorvastatin  (LIPITOR) 20 MG tablet, Take 1 tablet (20 mg total) by mouth at bedtime., Disp: 90 tablet, Rfl: 3   diazepam  (VALIUM ) 5 MG tablet, TAKE 1 TABLET (5 MG TOTAL) BY MOUTH TWICE A DAY AS NEEDED FOR ANXIETY, Disp: 30 tablet, Rfl: 2   esomeprazole (NEXIUM) 20 MG capsule, Take 20 mg by mouth daily at 12 noon., Disp: , Rfl:    furosemide  (LASIX ) 20 MG tablet, Take 1 tablet (20 mg total) by mouth daily for 5 days. (Patient taking differently: Take 20 mg by mouth daily as needed for fluid or edema.), Disp: 30 tablet, Rfl: 3   gabapentin  (NEURONTIN ) 600 MG tablet, TAKE 1 TABLET BY MOUTH AT BEDTIME.  (Patient taking differently: Take 600 mg by mouth daily.), Disp: 60 tablet, Rfl: 3   Glycopyrrolate-Formoterol (BEVESPI  AEROSPHERE) 9-4.8 MCG/ACT AERO, Inhale 2 puffs into the lungs 2 (two) times daily., Disp: 10.7 g, Rfl: 5   ipratropium-albuterol  (DUONEB) 0.5-2.5 (3) MG/3ML SOLN, Take 3 mLs by nebulization every 4 (four) hours as needed (shortness of breath, wheezing, or cough)., Disp: 360 mL, Rfl: 5   levofloxacin  (LEVAQUIN ) 750 MG tablet, Take 1 tablet (750 mg total) by mouth daily., Disp: 10 tablet, Rfl: 0   levothyroxine  (SYNTHROID ) 88 MCG tablet, Take 1 tablet (88 mcg total) by mouth daily at 6 (six) AM., Disp: 90 tablet, Rfl: 2   nystatin cream (MYCOSTATIN), Apply topically., Disp: , Rfl:    promethazine  (PHENERGAN ) 25 MG tablet, Take 1 tablet (25 mg total) by mouth every 8 (eight) hours as needed for nausea or vomiting., Disp: 30 tablet, Rfl: 3   traZODone  (DESYREL ) 100 MG tablet, Take 0.5 tablets (50 mg total) by mouth at bedtime., Disp: , Rfl:

## 2023-10-22 NOTE — Progress Notes (Signed)
 Synopsis: hx or MRSA pneumonia  Subjective:   PATIENT ID: Sabrina Roberson GENDER: female DOB: 04-Jan-1971, MRN: [id number]  HPI  Chief Complaint  Patient presents with   Medical Management of Chronic Issues    PT states sputum / green -yellowish / cough    Sabrina Roberson is a 53 year old woman, former smoker with DMII, GERD, depression/anxiety, hypertension, CKD and asthma who returns to pulmonary clinic for recurrent pneumonias.  OV 06/10/23 She was last seen by Izetta Rouleau, NP 03/17/23. Sputum cultures have been grown MRSA since 01/20/23. Most recent sputum sample collected 03/17/23. She was placed on a 14 day course of linezolid  starting on 03/20/23.   She continues to have productive cough with green sputum. She experiences cold chills but no fever. Recent dental work has led to difficulty swallowing, complicating chewing and swallowing.  She quit smoking 12/2022. She has been on disability.  OV 07/14/23 She has a persistent cough with thick, brown, greenish-yellow sputum, worsened by heat and humidity, and a sensation of airway closure during coughing. A crackling sensation in her chest is present. She experiences chills but is unsure about fever. Her voice becomes raspy and stops during speech, particularly when ordering. She has not smoked since December after a hospitalization. Her family history includes emphysema on her mother's side.  She uses an albuterol  inhaler as needed but has not used her hypertonic saline treatment since starting antibiotics. She has not used the Anoro inhaler due to insurance issues. She previously used prednisone  but experienced significant jitteriness. Swelling in her arm has improved with antibiotics, though some soreness persists.  OV 10/22/23 She continues to experience chills without fever and often feels cold. During her recent hospitalization for pneumonia, cultures revealed MRSA and stenotrophomonas. She completed a five-day course of antibiotics at  home. She denies hemoptysis but has green and yellow sputum production. She uses a flutter valve and has resumed nebulizer treatments. She is also using a Bevespi  inhaler.  Previous bronchoscopy did not reveal significant abnormalities but narrowing of RML entrance. She experiences a sensation of things getting stuck in her throat when swallowing, which sometimes causes her to feel 'choked up'. She has been on a ventilator twice in the past, with the second time being a prolonged process.  History and imaging reviewed with colleagues, concern for right middle lobe syndrome vs immunodeficiency vs dysphagia/aspiration risk are included as differentials.  Past Medical History:  Diagnosis Date   Abdominal pain 02/11/2011   Abnormal CT of the chest 02/04/2014   CT chest 08/2013:  atx in RML and lingula  CT angio 12/2013:  No PE, RML scarring, abnormal density in lingula  (done at [facility])     Allergy     AMS (altered mental status) 09/03/2023   Anxiety    Arthritis    Asthma    Bruises easily    Chronic chest wall pain    Chronic pain    Cough    Depression    Diabetes mellitus without complication (HCC)    Dyspnea    chronic   Dysuria 09/18/2022   Encounter for therapeutic drug monitoring 02/11/2011   Fibromyalgia    Functional movement disorder    GERD (gastroesophageal reflux disease)    Headache    Hearing loss    High cholesterol    History of hiatal hernia    Hypertension    Hypothyroidism    Kidney disease    stage 3   Leg swelling  both   Localized swelling of both lower legs 05/29/2023   Nausea 02/11/2011   Nausea and vomiting 04/01/2013   Pain management    Paranoid behavior (HCC) 07/08/2023   PONV (postoperative nausea and vomiting)    Sebaceous cyst of breast, right subaereolar 10/30/2010   Septic shock (HCC) 01/19/2023   SMAS (superior mesenteric artery syndrome)    Thyroid  disease    TIA (transient ischemic attack)    Tic disorder    TMJ (dislocation  of temporomandibular joint)    Transient cerebral ischemia    Type 2 diabetes mellitus with complications (HCC)      Family History  Problem Relation Age of Onset   Asthma Father    Breast cancer Sister    Hypertension Sister    Parkinson's disease Maternal Aunt    Pulmonary fibrosis Maternal Grandmother    Emphysema Maternal Grandmother    Heart disease Maternal Grandmother    Breast cancer Maternal Grandmother    Asthma Maternal Grandfather    Heart disease Maternal Grandfather    Cancer Paternal Grandmother        breast   Heart disease Paternal Grandmother    Breast cancer Paternal Grandmother    Asthma Paternal Grandmother    Leukemia Paternal Grandfather    Heart disease Paternal Grandfather    Clotting disorder Paternal Grandfather      Social History   Socioeconomic History   Marital status: Married    Spouse name: Not on file   Number of children: 2   Years of education: Not on file   Highest education level: 12th grade  Occupational History   Occupation: disabled  Tobacco Use   Smoking status: Former    Current packs/day: 0.00    Average packs/day: 0.5 packs/day for 15.0 years (7.5 ttl pk-yrs)    Types: Cigarettes    Quit date: 01/19/2023    Years since quitting: 0.7   Smokeless tobacco: Never  Vaping Use   Vaping status: Former  Substance and Sexual Activity   Alcohol use: No   Drug use: No   Sexual activity: Not Currently    Partners: Male    Birth control/protection: None  Other Topics Concern   Not on file  Social History Narrative   Not on file   Social Drivers of Health   Financial Resource Strain: Low Risk  (03/02/2023)   Overall Financial Resource Strain (CARDIA)    Difficulty of Paying Living Expenses: Not hard at all  Food Insecurity: No Food Insecurity (09/02/2023)   Hunger Vital Sign    Worried About Running Out of Food in the Last Year: Never true    Ran Out of Food in the Last Year: Never true  Transportation Needs: No  Transportation Needs (09/02/2023)   PRAPARE - Administrator, Civil Service (Medical): No    Lack of Transportation (Non-Medical): No  Physical Activity: Inactive (03/02/2023)   Exercise Vital Sign    Days of Exercise per Week: 0 days    Minutes of Exercise per Session: 20 min  Stress: Stress Concern Present (03/02/2023)   [employer] of Occupational Health - Occupational Stress Questionnaire    Feeling of Stress : Very much  Social Connections: Moderately Isolated (03/04/2023)   Social Connection and Isolation Panel    Frequency of Communication with Friends and Family: More than three times a week    Frequency of Social Gatherings with Friends and Family: More than three times a week    Attends  Religious Services: Never    Active Member of Clubs or Organizations: No    Attends Banker Meetings: Never    Marital Status: Married  Catering manager Violence: Not At Risk (09/03/2023)   Humiliation, Afraid, Rape, and Kick questionnaire    Fear of Current or Ex-Partner: No    Emotionally Abused: No    Physically Abused: No    Sexually Abused: No     Allergies  Allergen Reactions   Latex Anaphylaxis and Swelling   Codeine Nausea And Vomiting    GI Upset (intolerance), Vomiting (intolerance)   Ibuprofen Other (See Comments)    GI upset, burning sensation   Oxycodone  Nausea And Vomiting    Pre-medication with phenergan  needed.   Tape Itching and Rash    Please use paper tape only.     Outpatient Medications Prior to Visit  Medication Sig Dispense Refill   albuterol  (VENTOLIN  HFA) 108 (90 Base) MCG/ACT inhaler TAKE 2 PUFFS BY MOUTH EVERY 6 HOURS AS NEEDED FOR WHEEZE OR SHORTNESS OF BREATH 8.5 each 2   ARIPiprazole  (ABILIFY ) 5 MG tablet Take 5 mg by mouth daily.     atorvastatin  (LIPITOR) 20 MG tablet Take 1 tablet (20 mg total) by mouth at bedtime. 90 tablet 3   diazepam  (VALIUM ) 5 MG tablet TAKE 1 TABLET (5 MG TOTAL) BY MOUTH TWICE A DAY AS NEEDED FOR  ANXIETY 30 tablet 2   esomeprazole (NEXIUM) 20 MG capsule Take 20 mg by mouth daily at 12 noon.     furosemide  (LASIX ) 20 MG tablet Take 1 tablet (20 mg total) by mouth daily for 5 days. (Patient taking differently: Take 20 mg by mouth daily as needed for fluid or edema.) 30 tablet 3   gabapentin  (NEURONTIN ) 600 MG tablet TAKE 1 TABLET BY MOUTH AT BEDTIME. (Patient taking differently: Take 600 mg by mouth daily.) 60 tablet 3   Glycopyrrolate-Formoterol (BEVESPI  AEROSPHERE) 9-4.8 MCG/ACT AERO Inhale 2 puffs into the lungs 2 (two) times daily. 10.7 g 5   ipratropium-albuterol  (DUONEB) 0.5-2.5 (3) MG/3ML SOLN Take 3 mLs by nebulization every 4 (four) hours as needed (shortness of breath, wheezing, or cough). 360 mL 5   levothyroxine  (SYNTHROID ) 88 MCG tablet Take 1 tablet (88 mcg total) by mouth daily at 6 (six) AM. 90 tablet 2   nystatin cream (MYCOSTATIN) Apply topically.     promethazine  (PHENERGAN ) 25 MG tablet Take 1 tablet (25 mg total) by mouth every 8 (eight) hours as needed for nausea or vomiting. 30 tablet 3   traZODone  (DESYREL ) 100 MG tablet Take 0.5 tablets (50 mg total) by mouth at bedtime.     No facility-administered medications prior to visit.    Review of Systems  Constitutional:  Positive for chills. Negative for fever, malaise/fatigue and weight loss.  HENT:  Negative for congestion, sinus pain and sore throat.   Eyes: Negative.   Respiratory:  Positive for cough, sputum production and shortness of breath. Negative for hemoptysis and wheezing.   Cardiovascular:  Negative for chest pain, palpitations, orthopnea, claudication and leg swelling.  Gastrointestinal:  Negative for abdominal pain, heartburn, nausea and vomiting.  Genitourinary: Negative.   Musculoskeletal:  Negative for joint pain and myalgias.  Skin:  Negative for rash.  Neurological:  Negative for weakness.  Endo/Heme/Allergies: Negative.   Psychiatric/Behavioral: Negative.      Objective:   Vitals:    10/22/23 1359  BP: 129/83  Pulse: (!) 56  SpO2: 92%  Weight: 150 lb 6.4 oz (68.2  kg)  Height: 5' 4 (1.626 m)    Physical Exam Constitutional:      General: She is not in acute distress.    Appearance: Normal appearance.  Eyes:     General: No scleral icterus.    Conjunctiva/sclera: Conjunctivae normal.  Cardiovascular:     Rate and Rhythm: Normal rate and regular rhythm.  Pulmonary:     Breath sounds: Rales (scattered on right) present. No wheezing or rhonchi.  Musculoskeletal:     Right lower leg: No edema.     Left lower leg: No edema.  Skin:    General: Skin is warm and dry.  Neurological:     General: No focal deficit present.    CBC    Component Value Date/Time   WBC 6.8 10/16/2023 1119   WBC 7.6 09/08/2023 0524   RBC 4.02 10/16/2023 1119   RBC 3.65 (L) 09/08/2023 0524   HGB 11.6 10/16/2023 1119   HCT 37.6 10/16/2023 1119   PLT 330 10/16/2023 1119   MCV 94 10/16/2023 1119   MCH 28.9 10/16/2023 1119   MCH 29.6 09/08/2023 0524   MCHC 30.9 (L) 10/16/2023 1119   MCHC 31.1 09/08/2023 0524   RDW 13.5 10/16/2023 1119   LYMPHSABS 1.6 10/16/2023 1119   MONOABS 0.3 09/08/2023 0524   EOSABS 0.1 10/16/2023 1119   BASOSABS 0.0 10/16/2023 1119     Chest imaging: CT Chest 09/02/23 1. No pulmonary embolism. 2. Progressive atelectasis in the right middle lobe and new 1.8 cm focal airspace infiltrate centrally in the left lower lobe.  CT Chest 05/16/23 1. New 2.3 x 1.6 cm masslike abnormality in the azygoesophageal recess area, could indicate an area of new right lower lobe pulmonary consolidation, mass or an abnormal lymph node. Per Fleischner Society guidelines, recommend tissue sampling or PET-CT. This the recommendation follows the consensus statement: Guidelines for Management of Incidental Pulmonary Nodules Detected on CT Images: From the Fleischner Society 2017; Radiology 2017; 284:228-243. 2. New patchy ground-glass disease in the medial basal right  lower lobe, probably due to pneumonitis. 3. New loosely clustered nodules in the posterior basal left lower lobe ranging from 3 mm up to 8 mm in size. Given the short interval appearance, these are probably inflammatory nodules but will require follow-up. Non-contrast chest CT at 3-6 months is recommended. If the nodules are stable at time of repeat CT, then future CT at 18-24 months (from today's scan) is considered optional for low-risk patients, but is recommended for high-risk patients. This recommendation follows the consensus statement: Guidelines for Management of Incidental Pulmonary Nodules Detected on CT Images: From the Fleischner Society 2017; Radiology 2017; 284:228-243. 4. Diffuse bronchial thickening, mild bronchiectasis in the lower lobes and in the perihilar areas in the upper lobes. Bilateral bandlike opacities most likely scarring change. 5. Faint scattered ground-glass opacities in areas of previous dense consolidation, most likely representing ground-glass postpneumonic fibrosis. 6. Prominent pulmonary trunk indicating arterial hypertension. 7. Aortic atherosclerosis. 8. Osteopenia.  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 Patient should use of standardized swallowing precautions (small bites/sips via cup edge, sitting upright during PO) and intermittent throat clears. Recommend medications whole in puree.   Follow up in 3 month  Dorn Chill, MD [facility] Office: [phone]   Current Outpatient Medications:    albuterol  (VENTOLIN  HFA) 108 (90 Base) MCG/ACT inhaler, TAKE 2 PUFFS BY MOUTH EVERY 6 HOURS AS NEEDED FOR WHEEZE OR SHORTNESS OF BREATH, Disp: 8.5 each, Rfl: 2   ARIPiprazole  (ABILIFY ) 5 MG tablet, Take 5 mg by mouth daily., Disp: , Rfl:    atorvastatin  (LIPITOR) 20 MG tablet, Take 1 tablet (20 mg total) by mouth at bedtime., Disp: 90 tablet, Rfl: 3   diazepam  (VALIUM ) 5 MG tablet, TAKE 1 TABLET (5 MG TOTAL) BY MOUTH TWICE A DAY AS NEEDED FOR ANXIETY, Disp: 30 tablet, Rfl: 2   esomeprazole (NEXIUM) 20 MG capsule, Take 20 mg by mouth daily at 12 noon., Disp: , Rfl:    furosemide  (LASIX ) 20 MG tablet, Take 1 tablet (20 mg total) by mouth daily for 5 days. (Patient taking differently: Take 20 mg by mouth daily as needed for fluid or edema.), Disp: 30 tablet, Rfl: 3   gabapentin  (NEURONTIN ) 600 MG tablet, TAKE 1 TABLET BY MOUTH AT BEDTIME.  (Patient taking differently: Take 600 mg by mouth daily.), Disp: 60 tablet, Rfl: 3   Glycopyrrolate-Formoterol (BEVESPI  AEROSPHERE) 9-4.8 MCG/ACT AERO, Inhale 2 puffs into the lungs 2 (two) times daily., Disp: 10.7 g, Rfl: 5   ipratropium-albuterol  (DUONEB) 0.5-2.5 (3) MG/3ML SOLN, Take 3 mLs by nebulization every 4 (four) hours as needed (shortness of breath, wheezing, or cough)., Disp: 360 mL, Rfl: 5   levofloxacin  (LEVAQUIN ) 750 MG tablet, Take 1 tablet (750 mg total) by mouth daily., Disp: 10 tablet, Rfl: 0   levothyroxine  (SYNTHROID ) 88 MCG tablet, Take 1 tablet (88 mcg total) by mouth daily at 6 (six) AM., Disp: 90 tablet, Rfl: 2   nystatin cream (MYCOSTATIN), Apply topically., Disp: , Rfl:    promethazine  (PHENERGAN ) 25 MG tablet, Take 1 tablet (25 mg total) by mouth every 8 (eight) hours as needed for nausea or vomiting., Disp: 30 tablet, Rfl: 3   traZODone  (DESYREL ) 100 MG tablet, Take 0.5 tablets (50 mg total) by mouth at bedtime., Disp: , Rfl:

## 2023-10-25 LAB — RESPIRATORY CULTURE OR RESPIRATORY AND SPUTUM CULTURE
MICRO NUMBER:: 17011305
RESULT:: NORMAL
SPECIMEN QUALITY:: ADEQUATE

## 2023-10-26 ENCOUNTER — Telehealth: Payer: Self-pay | Admitting: Pulmonary Disease

## 2023-10-26 DIAGNOSIS — J9819 Other pulmonary collapse: Secondary | ICD-10-CM | POA: Insufficient documentation

## 2023-10-26 DIAGNOSIS — J189 Pneumonia, unspecified organism: Secondary | ICD-10-CM

## 2023-10-26 NOTE — Telephone Encounter (Signed)
 Burnard, please inform patient that I have discussed her scan with colleagues and it is recommended to repeat a bronchoscopy with a small scope to get a better look at her right lung and identify the changes occurring there.  Procedure Team: Please schedule the following:  Provider performing procedure:Dierdre Mccalip  Diagnosis: Recurrent Pneumonia Which side if for nodule / mass? right Procedure: Flexible Bronchoscopy with BAL, Need pediatric bronchoscope  Has patient been spoken to by Provider and given informed consent? Yes Anesthesia: general Do you need Fluro? no Duration of procedure: 35 minutes Date: 10/30/23 Alternate Date: 10/31/23  Time: 7:30AM Location: [facility] Does patient have OSA? no DM? no Or Latex allergy ? no Medication Restriction/ Anticoagulate/Antiplatelet: no Pre-op Labs Ordered:determined by Anesthesia Imaging request: n/a  (If, SuperDimension CT Chest, please have STAT courier sent to ENDO)   Thanks, Dorn Chill, MD [facility] Office: [phone]

## 2023-10-27 ENCOUNTER — Encounter: Payer: Self-pay | Admitting: Pulmonary Disease

## 2023-10-27 NOTE — Telephone Encounter (Signed)
 Patient has been scheduled for 10/30/23 at 11:00am at [facility]. Patient is aware and sending letter to patient.

## 2023-10-27 NOTE — Telephone Encounter (Signed)
 Anything @ 7:30 6-9 ??

## 2023-10-27 NOTE — Telephone Encounter (Signed)
 Patient has been scheduled for Bronch at 11/04/23. Left voicemail for patient to provide updated time/day. Mailing letter.

## 2023-10-27 NOTE — Telephone Encounter (Signed)
 Spoke with patient would like the 3rd of October if possible gave her information and I let her know once date is confirmed   she should have all information available in my chart.  VBU.  -NFN

## 2023-10-29 ENCOUNTER — Other Ambulatory Visit (HOSPITAL_BASED_OUTPATIENT_CLINIC_OR_DEPARTMENT_OTHER): Payer: Self-pay | Admitting: Family Medicine

## 2023-10-30 ENCOUNTER — Institutional Professional Consult (permissible substitution) (INDEPENDENT_AMBULATORY_CARE_PROVIDER_SITE_OTHER): Admitting: Otolaryngology

## 2023-11-02 ENCOUNTER — Encounter (HOSPITAL_BASED_OUTPATIENT_CLINIC_OR_DEPARTMENT_OTHER): Payer: Self-pay

## 2023-11-02 ENCOUNTER — Emergency Department (HOSPITAL_BASED_OUTPATIENT_CLINIC_OR_DEPARTMENT_OTHER): Admitting: Radiology

## 2023-11-02 ENCOUNTER — Emergency Department (HOSPITAL_BASED_OUTPATIENT_CLINIC_OR_DEPARTMENT_OTHER)

## 2023-11-02 ENCOUNTER — Emergency Department (HOSPITAL_BASED_OUTPATIENT_CLINIC_OR_DEPARTMENT_OTHER)
Admission: EM | Admit: 2023-11-02 | Discharge: 2023-11-02 | Disposition: A | Discharge status: Home / Self Care | Source: Home / Self Care | Attending: Emergency Medicine | Admitting: Emergency Medicine

## 2023-11-02 DIAGNOSIS — N183 Chronic kidney disease, stage 3 unspecified: Secondary | ICD-10-CM | POA: Insufficient documentation

## 2023-11-02 DIAGNOSIS — R053 Chronic cough: Secondary | ICD-10-CM | POA: Insufficient documentation

## 2023-11-02 DIAGNOSIS — J9819 Other pulmonary collapse: Secondary | ICD-10-CM | POA: Diagnosis not present

## 2023-11-02 DIAGNOSIS — Z91040 Latex allergy status: Secondary | ICD-10-CM | POA: Insufficient documentation

## 2023-11-02 DIAGNOSIS — I129 Hypertensive chronic kidney disease with stage 1 through stage 4 chronic kidney disease, or unspecified chronic kidney disease: Secondary | ICD-10-CM | POA: Insufficient documentation

## 2023-11-02 DIAGNOSIS — E1122 Type 2 diabetes mellitus with diabetic chronic kidney disease: Secondary | ICD-10-CM | POA: Insufficient documentation

## 2023-11-02 LAB — BASIC METABOLIC PANEL WITH GFR
Anion gap: 9 (ref 5–15)
BUN: 6 mg/dL (ref 6–20)
CO2: 30 mmol/L (ref 22–32)
Calcium: 8.9 mg/dL (ref 8.9–10.3)
Chloride: 107 mmol/L (ref 98–111)
Creatinine, Ser: 1.09 mg/dL — ABNORMAL HIGH (ref 0.44–1.00)
GFR, Estimated: >60 mL/min (ref >60)
Glucose, Bld: 86 mg/dL (ref 70–99)
Potassium: 4 mmol/L (ref 3.5–5.1)
Sodium: 146 mmol/L — ABNORMAL HIGH (ref 135–145)

## 2023-11-02 LAB — CBC
HCT: 37.1 % (ref 36.0–46.0)
Hemoglobin: 11.3 g/dL — ABNORMAL LOW (ref 12.0–15.0)
MCH: 28.8 pg (ref 26.0–34.0)
MCHC: 30.5 g/dL (ref 30.0–36.0)
MCV: 94.6 fL (ref 80.0–100.0)
Platelets: 251 K/uL (ref 150–400)
RBC: 3.92 MIL/uL (ref 3.87–5.11)
RDW: 13.7 % (ref 11.5–15.5)
WBC: 3.6 K/uL — ABNORMAL LOW (ref 4.0–10.5)
nRBC: 0 % (ref 0.0–0.2)

## 2023-11-02 LAB — RESP PANEL BY RT-PCR (RSV, FLU A&B, COVID)  RVPGX2
Influenza A by PCR: NEGATIVE
Influenza B by PCR: NEGATIVE
Resp Syncytial Virus by PCR: NEGATIVE
SARS Coronavirus 2 by RT PCR: NEGATIVE

## 2023-11-02 LAB — PRO BRAIN NATRIURETIC PEPTIDE: Pro Brain Natriuretic Peptide: 278 pg/mL (ref <300.0)

## 2023-11-02 LAB — CBG MONITORING, ED: Glucose-Capillary: 96 mg/dL (ref 70–99)

## 2023-11-02 MED ORDER — IOHEXOL 350 MG/ML SOLN
100.0000 mL | Freq: Once | INTRAVENOUS | Status: AC | PRN
  Administered 2023-11-02: 100 mL via INTRAVENOUS

## 2023-11-02 MED ORDER — IPRATROPIUM-ALBUTEROL 0.5-2.5 (3) MG/3ML IN SOLN
3.0000 mL | Freq: Once | RESPIRATORY_TRACT | Status: AC
  Administered 2023-11-02: 3 mL via RESPIRATORY_TRACT
  Filled 2023-11-02: qty 3

## 2023-11-02 NOTE — Discharge Instructions (Addendum)
 You were evaluated in the emergency room for an ongoing cough and shortness of breath.  Your lab work and imaging did not show any significant abnormality.  Please follow-up with your pulmonologist as scheduled this week.  If you experience any new or worsening symptoms please return to emergency room.

## 2023-11-02 NOTE — ED Notes (Signed)
 Patient transported to CT

## 2023-11-02 NOTE — ED Triage Notes (Signed)
 Pt reports worsening fatigue and SOB with occasional heaviness in chest and minimal amount of green sputum with occasionally productive cough for past 4 days.  Pt denies fever, reports chills, denies vomiting, some mild nausea.    AAOx4, NAD

## 2023-11-02 NOTE — ED Provider Notes (Signed)
 [place] EMERGENCY DEPARTMENT AT [facility] Provider Note   CSN: [id number] Arrival date & time: 11/02/23  1328     Patient presents with: Shortness of Breath   Sabrina Roberson is a 53 y.o. female past medical history of type 2 diabetes, hypertension, hyperlipidemia, CKD, bronchiectasis, prior smoker presents with complaints of shortness of breath and productive cough.  Patient reportedly had MRSA pneumonia in January requiring intubation and then again in August.  Family feels that patient had not fully recovered since original January infection.  Patient was discharged home on 5-day course of doxycycline  and prednisone  taper.  Followed up with pulmonology Dr. Kara.  She underwent a bronchoscopy which did not show any significant symptoms.  Plan to undergo repeat in the coming weeks.  Patient reports that her symptoms have been ongoing for the past few months.  Denies any chest pain.  She has been utilizing nebulizers at home without significant improvement.      Shortness of Breath     Past Medical History:  Diagnosis Date   Abdominal pain 02/11/2011   Abnormal CT of the chest 02/04/2014   CT chest 08/2013:  atx in RML and lingula  CT angio 12/2013:  No PE, RML scarring, abnormal density in lingula  (done at [facility])     Allergy     AMS (altered mental status) 09/03/2023   Anxiety    Arthritis    Asthma    Bruises easily    Chronic chest wall pain    Chronic pain    Cough    Depression    Diabetes mellitus without complication (HCC)    Dyspnea    chronic   Dysuria 09/18/2022   Encounter for therapeutic drug monitoring 02/11/2011   Fibromyalgia    Functional movement disorder    GERD (gastroesophageal reflux disease)    Headache    Hearing loss    High cholesterol    History of hiatal hernia    Hypertension    Hypothyroidism    Kidney disease    stage 3   Leg swelling    both   Localized swelling of both lower legs 05/29/2023   Nausea  02/11/2011   Nausea and vomiting 04/01/2013   Pain management    Paranoid behavior (HCC) 07/08/2023   PONV (postoperative nausea and vomiting)    Sebaceous cyst of breast, right subaereolar 10/30/2010   Septic shock (HCC) 01/19/2023   SMAS (superior mesenteric artery syndrome)    Thyroid  disease    TIA (transient ischemic attack)    Tic disorder    TMJ (dislocation of temporomandibular joint)    Transient cerebral ischemia    Type 2 diabetes mellitus with complications (HCC)    Past Surgical History:  Procedure Laterality Date   AORTA - SUPERIOR MESENTERIC AND AORTA - RENAL ARTERY BYPASS GRAFT     APPENDECTOMY     BREAST CYST EXCISION Right    BREAST EXCISIONAL BIOPSY     CHOLECYSTECTOMY     cyst removed     FLEXIBLE BRONCHOSCOPY Bilateral 06/27/2023   Procedure: BRONCHOSCOPY, FLEXIBLE;  Surgeon: Kara Dorn NOVAK, MD;  Location: [facility] ENDOSCOPY;  Service: Pulmonary;  Laterality: Bilateral;   HERNIA REPAIR     OOPHORECTOMY     bilateral   TEE WITHOUT CARDIOVERSION N/A 06/18/2012   Procedure: TRANSESOPHAGEAL ECHOCARDIOGRAM (TEE);  Surgeon: Jerel Balding, MD;  Location: [facility] ENDOSCOPY;  Service: Cardiovascular;  Laterality: N/A;   TOTAL ABDOMINAL HYSTERECTOMY       Prior  to Admission medications   Medication Sig Start Date End Date Taking? Authorizing Provider  albuterol  (VENTOLIN  HFA) 108 (90 Base) MCG/ACT inhaler TAKE 2 PUFFS BY MOUTH EVERY 6 HOURS AS NEEDED FOR WHEEZE OR SHORTNESS OF BREATH 08/14/23   Caudle, Thersia Bitters, FNP  ARIPiprazole  (ABILIFY ) 5 MG tablet Take 5 mg by mouth daily. 08/27/23   [provider]  atorvastatin  (LIPITOR) 20 MG tablet Take 1 tablet (20 mg total) by mouth at bedtime. 11/04/22   Caudle, Thersia Bitters, FNP  diazepam  (VALIUM ) 5 MG tablet TAKE 1 TABLET (5 MG TOTAL) BY MOUTH TWICE A DAY AS NEEDED FOR ANXIETY 10/08/23   Caudle, Thersia Bitters, FNP  esomeprazole (NEXIUM) 20 MG capsule Take 20 mg by mouth daily at 12 noon.    [provider]   furosemide  (LASIX ) 20 MG tablet Take 1 tablet (20 mg total) by mouth daily for 5 days. Patient taking differently: Take 20 mg by mouth daily as needed for fluid or edema. 05/29/23 10/22/23  CaudleThersia Bitters, FNP  gabapentin  (NEURONTIN ) 600 MG tablet TAKE 1 TABLET BY MOUTH AT BEDTIME. Patient taking differently: Take 600 mg by mouth daily. 06/26/23   Caudle, Thersia Bitters, FNP  Glycopyrrolate-Formoterol (BEVESPI  AEROSPHERE) 9-4.8 MCG/ACT AERO Inhale 2 puffs into the lungs 2 (two) times daily. 07/14/23   Kara Dorn NOVAK, MD  ipratropium-albuterol  (DUONEB) 0.5-2.5 (3) MG/3ML SOLN Take 3 mLs by nebulization every 4 (four) hours as needed (shortness of breath, wheezing, or cough). 10/16/23   Caudle, Thersia Bitters, FNP  levofloxacin  (LEVAQUIN ) 750 MG tablet Take 1 tablet (750 mg total) by mouth daily. 10/22/23   Kara Dorn NOVAK, MD  levothyroxine  (SYNTHROID ) 88 MCG tablet Take 1 tablet (88 mcg total) by mouth daily at 6 (six) AM. 10/20/23   Caudle, Thersia Bitters, FNP  nystatin cream (MYCOSTATIN) Apply topically. 10/15/23   [provider]  promethazine  (PHENERGAN ) 25 MG tablet TAKE 1 TABLET BY MOUTH EVERY 8 HOURS AS NEEDED FOR NAUSEA OR VOMITING. 10/29/23   Caudle, Thersia Bitters, FNP  traZODone  (DESYREL ) 100 MG tablet Take 0.5 tablets (50 mg total) by mouth at bedtime. 10/16/23   Caudle, Thersia Bitters, FNP    Allergies: Latex, Codeine, Ibuprofen, Oxycodone , and Tape    Review of Systems  Respiratory:  Positive for shortness of breath.     Updated Vital Signs BP 112/79 (BP Location: Right Arm)   Pulse 63   Temp 98.1 F (36.7 C)   Resp 16   SpO2 95%   Physical Exam Vitals and nursing note reviewed.  Constitutional:      General: She is not in acute distress.    Appearance: She is well-developed.  HENT:     Head: Normocephalic and atraumatic.  Eyes:     Conjunctiva/sclera: Conjunctivae normal.  Cardiovascular:     Rate and Rhythm: Normal rate and regular rhythm.     Heart  sounds: No murmur heard. Pulmonary:     Effort: Pulmonary effort is normal. No respiratory distress.     Breath sounds: Normal breath sounds.  Abdominal:     Palpations: Abdomen is soft.     Tenderness: There is no abdominal tenderness.  Musculoskeletal:        General: No swelling.     Cervical back: Neck supple.  Skin:    General: Skin is warm and dry.     Capillary Refill: Capillary refill takes less than 2 seconds.  Neurological:     Mental Status: She is alert.  Psychiatric:  Mood and Affect: Mood normal.     (all labs ordered are listed, but only abnormal results are displayed) Labs Reviewed  BASIC METABOLIC PANEL WITH GFR - Abnormal; Notable for the following components:      Result Value   Sodium 146 (*)    Creatinine, Ser 1.09 (*)    All other components within normal limits  CBC - Abnormal; Notable for the following components:   WBC 3.6 (*)    Hemoglobin 11.3 (*)    All other components within normal limits  RESP PANEL BY RT-PCR (RSV, FLU A&B, COVID)  [id number]  PRO BRAIN NATRIURETIC PEPTIDE  CBG MONITORING, ED    EKG: EKG Interpretation Date/Time:  Sunday November 02 2023 14:12:54 EDT Ventricular Rate:  61 PR Interval:  172 QRS Duration:  68 QT Interval:  348 QTC Calculation: 350 R Axis:   40  Text Interpretation: Normal sinus rhythm Cannot rule out Anterior infarct , age undetermined Abnormal ECG When compared with ECG of 24-Sep-2023 16:05, T wave inversion now evident in Anterior leads Confirmed by Patsey Lot [phone]) on 11/02/2023 4:46:39 PM  Radiology: CT Angio Chest PE W and/or Wo Contrast Result Date: 11/02/2023 CLINICAL DATA:  Pulmonary embolism (PE) suspected, low to intermediate prob, neg D-dimer Shortness of breath. EXAM: CT ANGIOGRAPHY CHEST WITH CONTRAST TECHNIQUE: Multidetector CT imaging of the chest was performed using the standard protocol during bolus administration of intravenous contrast. Multiplanar CT image reconstructions and  MIPs were obtained to evaluate the vascular anatomy. RADIATION DOSE REDUCTION: This exam was performed according to the departmental dose-optimization program which includes automated exposure control, adjustment of the mA and/or kV according to patient size and/or use of iterative reconstruction technique. CONTRAST:  OMNIPAQUE  IOHEXOL  350 MG/ML SOLN COMPARISON:  Chest radiograph earlier today.  Chest CT 09/02/2023 FINDINGS: Cardiovascular: There are no filling defects within the pulmonary arteries to suggest pulmonary embolus. Main pulmonary artery is dilated at 3.3 cm. Mild aortic atherosclerosis. No aortic aneurysm. The heart is borderline enlarged. Contrast refluxes into the hepatic veins and IVC. No pericardial effusion. Mediastinum/Nodes: No enlarged mediastinal or hilar lymph nodes. Decompressed esophagus. No thyroid  nodule. Lungs/Pleura: Again seen partial collapse with volume loss and atelectasis involving the anterior right upper lobe and medial right lower lobe. The left lower lobe airspace disease that was new on prior has resolved. No new airspace disease. Bandlike scarring in the left upper lobe. Tiny nodules are stable from prior exam. Circumferential wall thickening of the right bronchus intermedius. Upper Abdomen: Cholecystectomy. Musculoskeletal: There are no acute or suspicious osseous abnormalities. Review of the MIP images confirms the above findings. IMPRESSION: 1. No pulmonary embolus. 2. Again seen partial collapse with volume loss and atelectasis involving the anterior right upper lobe and medial right lower lobe. 3. The left lower lobe airspace disease that was new on prior has resolved. 4. Circumferential wall thickening of the right bronchus intermedius, nonspecific. 5. Dilated main pulmonary artery suggesting pulmonary arterial hypertension. Aortic Atherosclerosis (ICD10-I70.0). Electronically Signed   By: Andrea Gasman M.D.   On: 11/02/2023 16:27   DG Chest 2 View Result  Date: 11/02/2023 CLINICAL DATA:  SOB EXAM: CHEST - 2 VIEW COMPARISON:  Chest x-ray 09/02/2023, chest x-ray 01/29/2023, chest x-ray 03/04/2023 FINDINGS: The heart and mediastinal contours are unchanged. Atherosclerotic plaque. Chronic coarsened interstitial markings with no overt pulmonary edema. No focal consolidation. No pleural effusion. No pneumothorax. No acute osseous abnormality. IMPRESSION: 1. No acute cardiopulmonary abnormality. 2. Question underlying emphysema/bronchiolitis. 3. Aortic Atherosclerosis (ICD10-I70.0).  Electronically Signed   By: Morgane  Naveau M.D.   On: 11/02/2023 14:44     Procedures   Medications Ordered in the ED  ipratropium-albuterol  (DUONEB) 0.5-2.5 (3) MG/3ML nebulizer solution 3 mL (3 mLs Nebulization Given 11/02/23 1521)  iohexol  (OMNIPAQUE ) 350 MG/ML injection 100 mL (100 mLs Intravenous Contrast Given 11/02/23 1602)    Clinical Course as of 11/02/23 1649  Sun Nov 02, 2023  1456 ED EKG Normal sinus rhythm, nonspecific T wave abnormalities [JT]  1526 CBC(!) No leukocytosis, hemoglobin stable [JT]  1527 Resp panel by RT-PCR (RSV, Flu A&B, Covid) Anterior Nasal Swab Negative [JT]  1527 Basic metabolic panel(!) Hyponatremia of 146, mildly elevated creatinine from baseline to 1.09 [JT]  1527 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  She is hemodynamically stable.  Lung sounds have diffuse rhonchi.  Her lab work does not show any significant abnormality.  Her chest x-ray appears clear.  Will repeat CT scan here today and provide breathing treatment. [JT]  1633 CT Angio Chest PE W and/or Wo Contrast No PE resolved left lower lobe airspace [JT]  1641 Workup overall reassuring.  No evidence of infectious process at this time. Upon chart review it appears that patient's lung sounds have been consistently poor over the past several months and I suspect  that she has near baseline. Patient is not hypoxic and appears comfortable.  She has a follow-up appointment with her pulmonologist in 2 days.  Strict return precautions have been provided.  Discussed empiric antibiotics versus steroids.  Given that she has been on and off steroids and antibiotics over the past few months, we have agreed to hold off on any further medications at this time.   Patient and family are in agreement with plan. [JT]    Clinical Course User Index [JT] Donnajean Lynwood DEL, PA-C                                 Medical Decision Making Amount and/or Complexity of Data Reviewed Labs: ordered. Radiology: ordered. ECG/medicine tests:  Decision-making details documented in ED Course.  Risk Prescription drug management.   This patient presents to the ED with chief complaint(s) of shortness of breath.  The complaint involves an extensive differential diagnosis and also carries with it a high risk of complications and morbidity.   Pertinent past medical history as listed in HPI  The differential diagnosis includes  No evidence of pneumonia or PE on scans.  She has no chest pain to suggest ACS.  EKG is additionally without any ischemic changes. Additional history obtained: Additional history obtained from family Records reviewed Care Everywhere/External Records  Disposition:   Patient will be discharged home. The patient has been appropriately medically screened and/or stabilized in the ED. I have low suspicion for any other emergent medical condition which would require further screening, evaluation or treatment in the ED or require inpatient management. At time of discharge the patient is hemodynamically stable and in no acute distress. I have discussed work-up results and diagnosis with patient and answered all questions. Patient is agreeable with discharge plan. We discussed strict return precautions for returning to the emergency department and they verbalized  understanding.     Social Determinants of Health:   none  This note was dictated with voice recognition software.  Despite best efforts at proofreading, errors may  have occurred which can change the documentation meaning.       Final diagnoses:  Chronic cough    ED Discharge Orders     None          Donnajean Lynwood VEAR DEVONNA 11/02/23 1652    Patsey Lot, MD 11/02/23 2321

## 2023-11-03 NOTE — Anesthesia Preprocedure Evaluation (Signed)
 Anesthesia Evaluation  Patient identified by MRN, date of birth, ID band Patient awake    Reviewed: Allergy  & Precautions, NPO status , Patient's Chart, lab work & pertinent test results  History of Anesthesia Complications (+) PONV and history of anesthetic complications  Airway Mallampati: III  TM Distance: >3 FB Neck ROM: Full   Comment: Previous grade I view with MAC 3, easy mask Dental  (+) Missing, Dental Advisory Given   Pulmonary asthma , neg sleep apnea, pneumonia (recurrent), resolved, former smoker Bronchiectasis   Seen in ED 11/02/2023 for SOB and productive cough. No signs of pneumonia or PE on imaging. Previous haziness improving. Patient discharged home. Reports her SOB is at baseline, occurring with certain movements.   Pulmonary exam normal breath sounds clear to auscultation       Cardiovascular hypertension, + DOE  (-) Past MI, (-) Cardiac Stents and (-) CABG + dysrhythmias Supra Ventricular Tachycardia  Rhythm:Regular Rate:Normal  Superior mesenteric artery syndrome, HLD  TTE 01/20/2023: IMPRESSIONS    1. Left ventricular ejection fraction, by estimation, is 50 to 55%. The  left ventricle has low normal function. The left ventricle has no regional  wall motion abnormalities. Left ventricular diastolic parameters were  normal.   2. Right ventricular systolic function is low normal. The right  ventricular size is normal.   3. The mitral valve is normal in structure. No evidence of mitral valve  regurgitation. No evidence of mitral stenosis.   4. The aortic valve is normal in structure. Aortic valve regurgitation is  not visualized. No aortic stenosis is present.   5. The inferior vena cava is dilated in size with <50% respiratory  variability, suggesting right atrial pressure of 15 mmHg.     Neuro/Psych  Headaches PSYCHIATRIC DISORDERS Anxiety Depression    Tic disorder, chronic pain TIA    GI/Hepatic Neg liver ROS, hiatal hernia,GERD  Medicated,,  Endo/Other  diabetes, Type 2Hypothyroidism    Renal/GU CRFRenal disease     Musculoskeletal  (+) Arthritis ,  Fibromyalgia -  Abdominal   Peds  Hematology  (+) Blood dyscrasia, anemia Lab Results      Component                Value               Date                      WBC                      3.6 (L)             11/02/2023                HGB                      11.3 (L)            11/02/2023                HCT                      37.1                11/02/2023                MCV  94.6                11/02/2023                PLT                      251                 11/02/2023              Anesthesia Other Findings   Reproductive/Obstetrics                              Anesthesia Physical Anesthesia Plan  ASA: 3  Anesthesia Plan: General   Post-op Pain Management:    Induction: Intravenous  PONV Risk Score and Plan: 4 or greater and Ondansetron , Dexamethasone , Propofol  infusion, TIVA, Midazolam  and Treatment may vary due to age or medical condition  Airway Management Planned: Oral ETT  Additional Equipment:   Intra-op Plan:   Post-operative Plan: Extubation in OR  Informed Consent: I have reviewed the patients History and Physical, chart, labs and discussed the procedure including the risks, benefits and alternatives for the proposed anesthesia with the patient or authorized representative who has indicated his/her understanding and acceptance.     Dental advisory given  Plan Discussed with: CRNA and Anesthesiologist  Anesthesia Plan Comments: (Risks of general anesthesia discussed including, but not limited to, sore throat, hoarse voice, chipped/damaged teeth, injury to vocal cords, nausea and vomiting, allergic reactions, lung infection, heart attack, stroke, and death. All questions answered. )         Anesthesia Quick Evaluation

## 2023-11-04 ENCOUNTER — Inpatient Hospital Stay (HOSPITAL_COMMUNITY): Admitting: Anesthesiology

## 2023-11-04 ENCOUNTER — Encounter (HOSPITAL_COMMUNITY): Payer: Self-pay | Admitting: Pulmonary Disease

## 2023-11-04 ENCOUNTER — Ambulatory Visit (HOSPITAL_COMMUNITY): Admitting: Anesthesiology

## 2023-11-04 ENCOUNTER — Inpatient Hospital Stay (HOSPITAL_COMMUNITY)
Admission: AD | Admit: 2023-11-04 | Discharge: 2023-11-05 | DRG: 205 | Disposition: A | Discharge status: Home / Self Care | Attending: Emergency Medicine | Admitting: Emergency Medicine

## 2023-11-04 ENCOUNTER — Other Ambulatory Visit: Payer: Self-pay

## 2023-11-04 ENCOUNTER — Encounter (HOSPITAL_COMMUNITY)
Admission: AD | Disposition: A | Discharge status: Home / Self Care | Payer: Self-pay | Source: Home / Self Care | Attending: Emergency Medicine

## 2023-11-04 DIAGNOSIS — E782 Mixed hyperlipidemia: Secondary | ICD-10-CM | POA: Diagnosis present

## 2023-11-04 DIAGNOSIS — J9602 Acute respiratory failure with hypercapnia: Secondary | ICD-10-CM | POA: Diagnosis not present

## 2023-11-04 DIAGNOSIS — Z79899 Other long term (current) drug therapy: Secondary | ICD-10-CM

## 2023-11-04 DIAGNOSIS — J9601 Acute respiratory failure with hypoxia: Secondary | ICD-10-CM

## 2023-11-04 DIAGNOSIS — Z8249 Family history of ischemic heart disease and other diseases of the circulatory system: Secondary | ICD-10-CM

## 2023-11-04 DIAGNOSIS — Z87891 Personal history of nicotine dependence: Secondary | ICD-10-CM

## 2023-11-04 DIAGNOSIS — J189 Pneumonia, unspecified organism: Secondary | ICD-10-CM | POA: Insufficient documentation

## 2023-11-04 DIAGNOSIS — Z91040 Latex allergy status: Secondary | ICD-10-CM

## 2023-11-04 DIAGNOSIS — Z9079 Acquired absence of other genital organ(s): Secondary | ICD-10-CM

## 2023-11-04 DIAGNOSIS — R1312 Dysphagia, oropharyngeal phase: Secondary | ICD-10-CM | POA: Diagnosis present

## 2023-11-04 DIAGNOSIS — Z885 Allergy status to narcotic agent status: Secondary | ICD-10-CM

## 2023-11-04 DIAGNOSIS — Z79890 Hormone replacement therapy (postmenopausal): Secondary | ICD-10-CM | POA: Diagnosis not present

## 2023-11-04 DIAGNOSIS — Z825 Family history of asthma and other chronic lower respiratory diseases: Secondary | ICD-10-CM | POA: Diagnosis not present

## 2023-11-04 DIAGNOSIS — E119 Type 2 diabetes mellitus without complications: Secondary | ICD-10-CM | POA: Diagnosis not present

## 2023-11-04 DIAGNOSIS — I129 Hypertensive chronic kidney disease with stage 1 through stage 4 chronic kidney disease, or unspecified chronic kidney disease: Secondary | ICD-10-CM

## 2023-11-04 DIAGNOSIS — N182 Chronic kidney disease, stage 2 (mild): Secondary | ICD-10-CM | POA: Diagnosis present

## 2023-11-04 DIAGNOSIS — Z888 Allergy status to other drugs, medicaments and biological substances status: Secondary | ICD-10-CM

## 2023-11-04 DIAGNOSIS — F411 Generalized anxiety disorder: Secondary | ICD-10-CM | POA: Diagnosis present

## 2023-11-04 DIAGNOSIS — M797 Fibromyalgia: Secondary | ICD-10-CM | POA: Diagnosis present

## 2023-11-04 DIAGNOSIS — E039 Hypothyroidism, unspecified: Secondary | ICD-10-CM | POA: Diagnosis present

## 2023-11-04 DIAGNOSIS — Z90710 Acquired absence of both cervix and uterus: Secondary | ICD-10-CM

## 2023-11-04 DIAGNOSIS — Z886 Allergy status to analgesic agent status: Secondary | ICD-10-CM | POA: Diagnosis not present

## 2023-11-04 DIAGNOSIS — G8929 Other chronic pain: Secondary | ICD-10-CM | POA: Diagnosis present

## 2023-11-04 DIAGNOSIS — K219 Gastro-esophageal reflux disease without esophagitis: Secondary | ICD-10-CM | POA: Diagnosis present

## 2023-11-04 DIAGNOSIS — Z90722 Acquired absence of ovaries, bilateral: Secondary | ICD-10-CM

## 2023-11-04 DIAGNOSIS — Z8614 Personal history of Methicillin resistant Staphylococcus aureus infection: Secondary | ICD-10-CM

## 2023-11-04 DIAGNOSIS — E1122 Type 2 diabetes mellitus with diabetic chronic kidney disease: Secondary | ICD-10-CM | POA: Diagnosis present

## 2023-11-04 DIAGNOSIS — J9811 Atelectasis: Secondary | ICD-10-CM | POA: Diagnosis not present

## 2023-11-04 DIAGNOSIS — J4489 Other specified chronic obstructive pulmonary disease: Secondary | ICD-10-CM | POA: Diagnosis present

## 2023-11-04 DIAGNOSIS — J9589 Other postprocedural complications and disorders of respiratory system, not elsewhere classified: Secondary | ICD-10-CM | POA: Diagnosis not present

## 2023-11-04 DIAGNOSIS — Z7951 Long term (current) use of inhaled steroids: Secondary | ICD-10-CM

## 2023-11-04 DIAGNOSIS — Z8673 Personal history of transient ischemic attack (TIA), and cerebral infarction without residual deficits: Secondary | ICD-10-CM

## 2023-11-04 DIAGNOSIS — Z91048 Other nonmedicinal substance allergy status: Secondary | ICD-10-CM

## 2023-11-04 DIAGNOSIS — Z8701 Personal history of pneumonia (recurrent): Secondary | ICD-10-CM | POA: Diagnosis not present

## 2023-11-04 DIAGNOSIS — Z9049 Acquired absence of other specified parts of digestive tract: Secondary | ICD-10-CM

## 2023-11-04 DIAGNOSIS — J9819 Other pulmonary collapse: Principal | ICD-10-CM | POA: Diagnosis present

## 2023-11-04 HISTORY — PX: FLEXIBLE BRONCHOSCOPY: SHX5094

## 2023-11-04 HISTORY — PX: BRONCHIAL WASHINGS: SHX5105

## 2023-11-04 LAB — GLUCOSE, CAPILLARY
Glucose-Capillary: 110 mg/dL — ABNORMAL HIGH (ref 70–99)
Glucose-Capillary: 147 mg/dL — ABNORMAL HIGH (ref 70–99)
Glucose-Capillary: 140 mg/dL — ABNORMAL HIGH (ref 70–99)

## 2023-11-04 LAB — BODY FLUID CELL COUNT WITH DIFFERENTIAL
Eos, Fluid: 3 %
Lymphs, Fluid: 2 %
Monocyte-Macrophage-Serous Fluid: 8 % — ABNORMAL LOW (ref 50–90)
Neutrophil Count, Fluid: 87 % — ABNORMAL HIGH (ref 0–25)
Total Nucleated Cell Count, Fluid: 465 uL (ref 0–1000)

## 2023-11-04 SURGERY — BRONCHOSCOPY, FLEXIBLE
Anesthesia: General | Laterality: Bilateral

## 2023-11-04 MED ORDER — ATORVASTATIN CALCIUM 10 MG PO TABS
20.0000 mg | ORAL_TABLET | Freq: Every day | ORAL | Status: DC
  Administered 2023-11-05: 20 mg via ORAL
  Filled 2023-11-04: qty 2

## 2023-11-04 MED ORDER — LEVOTHYROXINE SODIUM 88 MCG PO TABS
88.0000 ug | ORAL_TABLET | Freq: Every day | ORAL | Status: DC
  Administered 2023-11-05: 88 ug via ORAL
  Filled 2023-11-04: qty 1

## 2023-11-04 MED ORDER — EPHEDRINE SULFATE-NACL 50-0.9 MG/10ML-% IV SOSY
PREFILLED_SYRINGE | INTRAVENOUS | Status: DC | PRN
  Administered 2023-11-04 (×2): 10 mg via INTRAVENOUS
  Administered 2023-11-04: 5 mg via INTRAVENOUS

## 2023-11-04 MED ORDER — ARIPIPRAZOLE 5 MG PO TABS
5.0000 mg | ORAL_TABLET | Freq: Every day | ORAL | Status: DC
  Administered 2023-11-05: 5 mg via ORAL
  Filled 2023-11-04: qty 1

## 2023-11-04 MED ORDER — AMISULPRIDE (ANTIEMETIC) 5 MG/2ML IV SOLN: 10.0000 mg | Freq: Once | INTRAVENOUS | Status: DC | PRN

## 2023-11-04 MED ORDER — ROCURONIUM BROMIDE 10 MG/ML (PF) SYRINGE
PREFILLED_SYRINGE | INTRAVENOUS | Status: DC | PRN
  Administered 2023-11-04: 50 mg via INTRAVENOUS

## 2023-11-04 MED ORDER — LACTATED RINGERS IV SOLN
INTRAVENOUS | Status: DC
Start: 2023-11-04 — End: 2023-11-05

## 2023-11-04 MED ORDER — POLYETHYLENE GLYCOL 3350 17 G PO PACK: 17.0000 g | PACK | Freq: Every day | ORAL | Status: DC | PRN

## 2023-11-04 MED ORDER — HEPARIN SODIUM (PORCINE) 5000 UNIT/ML IJ SOLN
5000.0000 [IU] | Freq: Three times a day (TID) | INTRAMUSCULAR | Status: DC
  Administered 2023-11-04 – 2023-11-05 (×3): 5000 [IU] via SUBCUTANEOUS
  Filled 2023-11-04 (×3): qty 1

## 2023-11-04 MED ORDER — INSULIN ASPART 100 UNIT/ML IJ SOLN
0.0000 [IU] | INTRAMUSCULAR | Status: DC
  Administered 2023-11-04 (×2): 2 [IU] via SUBCUTANEOUS

## 2023-11-04 MED ORDER — CHLORHEXIDINE GLUCONATE 0.12 % MT SOLN
15.0000 mL | Freq: Once | OROMUCOSAL | Status: AC
  Administered 2023-11-04: 15 mL via OROMUCOSAL

## 2023-11-04 MED ORDER — ALBUTEROL SULFATE HFA 108 (90 BASE) MCG/ACT IN AERS
INHALATION_SPRAY | RESPIRATORY_TRACT | Status: DC | PRN
  Administered 2023-11-04: 8 via RESPIRATORY_TRACT

## 2023-11-04 MED ORDER — CHLORHEXIDINE GLUCONATE 0.12 % MT SOLN
OROMUCOSAL | Status: AC
  Filled 2023-11-04: qty 15

## 2023-11-04 MED ORDER — ALBUTEROL SULFATE (2.5 MG/3ML) 0.083% IN NEBU: 2.5000 mg | INHALATION_SOLUTION | RESPIRATORY_TRACT | Status: DC | PRN

## 2023-11-04 MED ORDER — SUGAMMADEX SODIUM 200 MG/2ML IV SOLN
INTRAVENOUS | Status: DC | PRN
  Administered 2023-11-04: 200 mg via INTRAVENOUS

## 2023-11-04 MED ORDER — ONDANSETRON HCL 4 MG/2ML IJ SOLN
INTRAMUSCULAR | Status: DC | PRN
  Administered 2023-11-04: 4 mg via INTRAVENOUS

## 2023-11-04 MED ORDER — ALBUTEROL SULFATE (2.5 MG/3ML) 0.083% IN NEBU
2.5000 mg | INHALATION_SOLUTION | Freq: Once | RESPIRATORY_TRACT | Status: AC
  Administered 2023-11-04: 2.5 mg via RESPIRATORY_TRACT

## 2023-11-04 MED ORDER — DOCUSATE SODIUM 100 MG PO CAPS: 100.0000 mg | ORAL_CAPSULE | Freq: Two times a day (BID) | ORAL | Status: DC | PRN

## 2023-11-04 MED ORDER — CHLORHEXIDINE GLUCONATE CLOTH 2 % EX PADS: 6.0000 | MEDICATED_PAD | Freq: Every day | CUTANEOUS | Status: DC

## 2023-11-04 MED ORDER — LIDOCAINE 2% (20 MG/ML) 5 ML SYRINGE
INTRAMUSCULAR | Status: DC | PRN
  Administered 2023-11-04: 100 mg via INTRAVENOUS

## 2023-11-04 MED ORDER — ALBUTEROL SULFATE (2.5 MG/3ML) 0.083% IN NEBU
INHALATION_SOLUTION | RESPIRATORY_TRACT | Status: AC
Start: 2023-11-04 — End: 2023-11-04
  Filled 2023-11-04: qty 3

## 2023-11-04 MED ORDER — FENTANYL CITRATE (PF) 100 MCG/2ML IJ SOLN: 25.0000 ug | INTRAMUSCULAR | Status: DC | PRN

## 2023-11-04 MED ORDER — GABAPENTIN 300 MG PO CAPS: 600.0000 mg | ORAL_CAPSULE | Freq: Every day | ORAL | Status: DC

## 2023-11-04 MED ORDER — SODIUM CHLORIDE 0.9 % IV SOLN: INTRAVENOUS | Status: DC | PRN

## 2023-11-04 MED ORDER — DEXAMETHASONE SODIUM PHOSPHATE 10 MG/ML IJ SOLN
INTRAMUSCULAR | Status: DC | PRN
  Administered 2023-11-04: 5 mg via INTRAVENOUS

## 2023-11-04 MED ORDER — IPRATROPIUM-ALBUTEROL 0.5-2.5 (3) MG/3ML IN SOLN
3.0000 mL | Freq: Four times a day (QID) | RESPIRATORY_TRACT | Status: DC
  Administered 2023-11-04 – 2023-11-05 (×3): 3 mL via RESPIRATORY_TRACT
  Filled 2023-11-04 (×4): qty 3

## 2023-11-04 MED ORDER — ACETAMINOPHEN 10 MG/ML IV SOLN: 1000.0000 mg | Freq: Once | INTRAVENOUS | Status: DC | PRN

## 2023-11-04 MED ORDER — PROPOFOL 10 MG/ML IV BOLUS
INTRAVENOUS | Status: DC | PRN
  Administered 2023-11-04: 150 ug/kg/min via INTRAVENOUS
  Administered 2023-11-04: 150 mg via INTRAVENOUS

## 2023-11-04 NOTE — Interval H&P Note (Signed)
 History and Physical Interval Note:  11/04/2023 7:21 AM  Sabrina Roberson  has presented today for surgery, with the diagnosis of Recurrent Pneumonia.  The various methods of treatment have been discussed with the patient and family. After consideration of risks, benefits and other options for treatment, the patient has consented to  Procedure(s): BRONCHOSCOPY, FLEXIBLE (Bilateral) as a surgical intervention.  The patient's history has been reviewed, patient examined, no change in status, stable for surgery.  I have reviewed the patient's chart and labs.  Questions were answered to the patient's satisfaction.     Dorn KATHEE Chill

## 2023-11-04 NOTE — Transfer of Care (Signed)
 Immediate Anesthesia Transfer of Care Note  Patient: Sabrina Roberson  Procedure(s) Performed: BRONCHOSCOPY, FLEXIBLE (Bilateral) IRRIGATION, BRONCHUS  Patient Location: PACU  Anesthesia Type:General  Level of Consciousness: drowsy  Airway & Oxygen Therapy: Patient Spontanous Breathing; BiPAP  Post-op Assessment: Report given to RN and Post -op Vital signs reviewed and stable  Post vital signs: Reviewed and stable  Last Vitals:  Vitals Value Taken Time  BP 111/57 11/04/23 09:15  Temp 36.3 C 11/04/23 09:05  Pulse 66 11/04/23 09:15  Resp 15 11/04/23 09:15  SpO2 99 % 11/04/23 09:15  Vitals shown include unfiled device data.  Last Pain:  Vitals:   11/04/23 0905  TempSrc:   PainSc: Asleep         Complications: No notable events documented.

## 2023-11-04 NOTE — Plan of Care (Signed)
   Problem: Education: Goal: Knowledge of General Education information will improve Description Including pain rating scale, medication(s)/side effects and non-pharmacologic comfort measures Outcome: Progressing

## 2023-11-04 NOTE — Anesthesia Procedure Notes (Signed)
 Procedure Name: Intubation Date/Time: 11/04/2023 7:45 AM  Performed by: Roslynn Waddell LABOR, CRNAPre-anesthesia Checklist: Patient identified, Emergency Drugs available, Suction available and Patient being monitored Patient Re-evaluated:Patient Re-evaluated prior to induction Oxygen Delivery Method: Circle System Utilized Preoxygenation: Pre-oxygenation with 100% oxygen Induction Type: IV induction Ventilation: Mask ventilation without difficulty Laryngoscope Size: Mac and 3 Grade View: Grade I Tube type: Oral Tube size: 8.5 mm Number of attempts: 1 Airway Equipment and Method: Stylet Placement Confirmation: ETT inserted through vocal cords under direct vision, positive ETCO2 and breath sounds checked- equal and bilateral Secured at: 22 cm Tube secured with: Tape Dental Injury: Teeth and Oropharynx as per pre-operative assessment  Comments: Cut to lip with masking during induction (no OPA used). Patient with self admitted poor dentition in preop (missing multiple). Dentition as per preop.

## 2023-11-04 NOTE — H&P (Signed)
 NAME:  Sabrina Roberson, MRN:  [id number], DOB:  10-26-1970, LOS: 0 ADMISSION DATE:  11/04/2023, CONSULTATION DATE:  11/04/23 REFERRING MD:  Kara CHIEF COMPLAINT:  Hypercapnia post bronch   History of Present Illness:  Sabrina Roberson is a 53 y.o. female who has a PMH including but not limited to DM 2, GERD, depression/anxiety, hypertension, CKD, asthma, recurrent pneumonias.  She is followed in the pulmonary clinic and sees Dr. Kara, last seen 10/22/2023 and was treated with 10-day course of Levaquin  for stenotrophomonas.  She was referred to immunology for evaluation of immunodeficiency.  In the past, her cultures have grown MRSA, haemophilus influenza, stenotrophomonas.  She has had prior bronchoscopies that did not reveal any significant abnormalities except narrowing of the RML entrance.  Dr. Kara discussed her case with his colleagues who mentioned a concern for right middle lobe syndrome and it was recommended that she have repeat bronchoscopy for further evaluation.  On 10/7, she presented for bronchoscopy with BAL.  This demonstrated purulent secretions in the right and left mainstem that were suctioned clarity, no endobronchial lesions noted, concern for excessive dynamic airway collapse of the posterior segment right upper lobe leading to recurrent pneumonias.  BAL was obtained and was sent for culture, cytology, cell count.  Following the procedure, she was extubated and while in PACU she was noted to be somnolent and hypercapnic with ETCO2 in the 50s.  She was subsequently started on BiPAP.  After an hour, she had minimal improvement and ETCO2 remained in the high 50s.  PCCM ground team was subsequently called for admission.  Pertinent  Medical History:  has DOE (dyspnea on exertion); Benzodiazepine dependence, episodic (HCC); DDD (degenerative disc disease), cervical; DDD (degenerative disc disease), lumbosacral; Fibromyalgia; Pars defect of lumbar spine; Primary insomnia; Type 2  diabetes mellitus with diabetic chronic kidney disease (HCC); Hypertension; Mixed hyperlipidemia; GAD (generalized anxiety disorder); Acquired hypothyroidism; Bilateral carotid bruits; CKD (chronic kidney disease) stage 2, GFR 60-89 ml/min; Bilateral lower extremity edema; Superior mesenteric artery syndrome; Physical deconditioning; Pneumonia due to methicillin resistant Staphylococcus aureus (MRSA) (HCC); Bronchiectasis without complication (HCC); Recurrent pneumonia; Severe episode of recurrent major depressive disorder, with psychotic features (HCC); Lymphadenopathy, axillary; Chronic cough; Dysdiadochokinesia; Pneumonia; Right middle lobe syndrome; and Acute hypercapnic respiratory failure (HCC) on their problem list.  Significant Hospital Events: Including procedures, antibiotic start and stop dates in addition to other pertinent events   10/7 bronch with BAL, hypercapnic post procedure requiring BiPAP  Interim History / Subjective:  On BiPAP, tolerating well. ETCO2 still mid 50s currently.  Objective:  Blood pressure (!) 103/58, pulse (!) 56, temperature (!) 97.4 F (36.3 C), resp. rate 12, height 5' 4 (1.626 m), weight 68.2 kg, SpO2 97%.    FiO2 (%):  [40 %] 40 %   Intake/Output Summary (Last 24 hours) at 11/04/2023 1011 Last data filed at 11/04/2023 0916 Gross per 24 hour  Intake 400 ml  Output 0 ml  Net 400 ml   Filed Weights   11/04/23 0647  Weight: 68.2 kg     Physical Exam: General: Adult female, resting in bed, in NAD. Neuro: Sleepy after anesthesia but opens eyes to voice. Answers basic questions, MAE's. HEENT: [place]/AT. Sclerae anicteric. BiPAP in place. Cardiovascular: RRR, no M/R/G.  Lungs: Respirations even and unlabored.  Coarse on right, clear on left Abdomen: BS x 4, soft, NT/ND.  Musculoskeletal: No gross deformities, no edema.    Assessment & Plan:   Acute hypoxic and hypercapnic respiratory failure - presumed  post anesthesia and slow to recover. -  Continue BiPAP. - Admit to progressive. - Avoid all sedating meds. - BD's PRN. - NPO for now, can resume diet later this PM once off BiPAP.  Concern for RML syndrome - s/p bronch with BAL 10/7 (Dr. Kara). - F/u on BAL culture, cytology, cell count. - F/u as outpatient.  Hx recurrent PNA. - Outpatient referral to immunology.  Hx Anxiety, Depression, Fibromyalgia, Paranoid behavior, TIA. - Continue PTA Aripiprazole , Gabapentin ,   Hx hypothyroidism. - Continue PA Synthroid .  DM2. - SSI.   Admitting at least for overnight obs. Try to wean off BiPAP this PM. Hopeful for d/c tomorrow 10/8.  Labs   CBC: Recent Labs  Lab 11/02/23 1420  WBC 3.6*  HGB 11.3*  HCT 37.1  MCV 94.6  PLT 251    Basic Metabolic Panel: Recent Labs  Lab 11/02/23 1420  NA 146*  K 4.0  CL 107  CO2 30  GLUCOSE 86  BUN 6  CREATININE 1.09*  CALCIUM  8.9   GFR: Estimated Creatinine Clearance: 57.9 mL/min (A) (by C-G formula based on SCr of 1.09 mg/dL (H)). Recent Labs  Lab 11/02/23 1420  WBC 3.6*    Liver Function Tests: No results for input(s): AST, ALT, ALKPHOS, BILITOT, PROT, ALBUMIN in the last 168 hours. No results for input(s): LIPASE, AMYLASE in the last 168 hours. No results for input(s): AMMONIA in the last 168 hours.  ABG    Component Value Date/Time   PHART 7.38 01/27/2023 0849   PCO2ART 54 (H) 01/27/2023 0849   PO2ART 72 (L) 01/27/2023 0849   HCO3 32.5 (H) 09/03/2023 0236   TCO2 22 01/19/2023 1903   ACIDBASEDEF 10.2 (H) 01/20/2023 1625   O2SAT 90.8 09/03/2023 0236     Coagulation Profile: No results for input(s): INR, PROTIME in the last 168 hours.  Cardiac Enzymes: No results for input(s): CKTOTAL, CKMB, CKMBINDEX, TROPONINI in the last 168 hours.  HbA1C: Hgb A1c MFr Bld  Date/Time Value Ref Range Status  09/01/2023 03:43 PM 5.4 4.8 - 5.6 % Final    Comment:             Prediabetes: 5.7 - 6.4          Diabetes: >6.4           Glycemic control for adults with diabetes: <7.0   01/20/2023 05:05 AM 6.9 (H) 4.8 - 5.6 % Final    Comment:    (NOTE) Pre diabetes:          5.7%-6.4%  Diabetes:              >6.4%  Glycemic control for   <7.0% adults with diabetes     CBG: Recent Labs  Lab 11/02/23 1419 11/04/23 0906  GLUCAP 96 110*    Review of Systems:   Unable to obtain as pt is encephalopathic currently.  Past Medical History:  She,  has a past medical history of Abdominal pain (02/11/2011), Abnormal CT of the chest (02/04/2014), Allergy , AMS (altered mental status) (09/03/2023), Anxiety, Arthritis, Asthma, Bruises easily, Chronic chest wall pain, Chronic pain, Cough, Depression, Diabetes mellitus without complication (HCC), Dyspnea, Dysuria (09/18/2022), Encounter for therapeutic drug monitoring (02/11/2011), Fibromyalgia, Functional movement disorder, GERD (gastroesophageal reflux disease), Headache, Hearing loss, High cholesterol, History of hiatal hernia, Hypertension, Hypothyroidism, Kidney disease, Leg swelling, Localized swelling of both lower legs (05/29/2023), Nausea (02/11/2011), Nausea and vomiting (04/01/2013), Pain management, Paranoid behavior (HCC) (07/08/2023), Sebaceous cyst of breast, right subaereolar (10/30/2010), Septic shock (  HCC) (01/19/2023), SMAS (superior mesenteric artery syndrome), Thyroid  disease, TIA (transient ischemic attack), Tic disorder, TMJ (dislocation of temporomandibular joint), Transient cerebral ischemia, and Type 2 diabetes mellitus with complications (HCC).   Surgical History:   Past Surgical History:  Procedure Laterality Date   AORTA - SUPERIOR MESENTERIC AND AORTA - RENAL ARTERY BYPASS GRAFT     APPENDECTOMY     BREAST CYST EXCISION Right    BREAST EXCISIONAL BIOPSY     CHOLECYSTECTOMY     cyst removed     FLEXIBLE BRONCHOSCOPY Bilateral 06/27/2023   Procedure: BRONCHOSCOPY, FLEXIBLE;  Surgeon: Kara Dorn NOVAK, MD;  Location: [facility] ENDOSCOPY;  Service:  Pulmonary;  Laterality: Bilateral;   HERNIA REPAIR     OOPHORECTOMY     bilateral   TEE WITHOUT CARDIOVERSION N/A 06/18/2012   Procedure: TRANSESOPHAGEAL ECHOCARDIOGRAM (TEE);  Surgeon: Jerel Balding, MD;  Location: [facility] ENDOSCOPY;  Service: Cardiovascular;  Laterality: N/A;   TOTAL ABDOMINAL HYSTERECTOMY       Social History:   reports that she quit smoking about 9 months ago. Her smoking use included cigarettes. She has a 7.5 pack-year smoking history. She has never used smokeless tobacco. She reports that she does not drink alcohol and does not use drugs.   Family History:  Her family history includes Asthma in her father, maternal grandfather, and paternal grandmother; Breast cancer in her maternal grandmother, paternal grandmother, and sister; Cancer in her paternal grandmother; Clotting disorder in her paternal grandfather; Emphysema in her maternal grandmother; Heart disease in her maternal grandfather, maternal grandmother, paternal grandfather, and paternal grandmother; Hypertension in her sister; Leukemia in her paternal grandfather; Parkinson's disease in her maternal aunt; Pulmonary fibrosis in her maternal grandmother.   Allergies Allergies  Allergen Reactions   Latex Anaphylaxis and Swelling   Codeine Nausea And Vomiting    GI Upset (intolerance), Vomiting (intolerance)   Ibuprofen Other (See Comments)    GI upset, burning sensation   Oxycodone  Nausea And Vomiting    Pre-medication with phenergan  needed.   Tape Itching and Rash    Please use paper tape only.     Home Medications  Prior to Admission medications   Medication Sig Start Date End Date Taking? Authorizing Provider  albuterol  (VENTOLIN  HFA) 108 (90 Base) MCG/ACT inhaler TAKE 2 PUFFS BY MOUTH EVERY 6 HOURS AS NEEDED FOR WHEEZE OR SHORTNESS OF BREATH 08/14/23  Yes Caudle, Thersia Bitters, FNP  ARIPiprazole  (ABILIFY ) 5 MG tablet Take 5 mg by mouth daily. 08/27/23  Yes [provider]  atorvastatin   (LIPITOR) 20 MG tablet Take 1 tablet (20 mg total) by mouth at bedtime. 11/04/22  Yes Caudle, Thersia Bitters, FNP  diazepam  (VALIUM ) 5 MG tablet TAKE 1 TABLET (5 MG TOTAL) BY MOUTH TWICE A DAY AS NEEDED FOR ANXIETY 10/08/23  Yes Caudle, Thersia Bitters, FNP  esomeprazole (NEXIUM) 20 MG capsule Take 20 mg by mouth daily at 12 noon.   Yes [provider]  gabapentin  (NEURONTIN ) 600 MG tablet TAKE 1 TABLET BY MOUTH AT BEDTIME. Patient taking differently: Take 600 mg by mouth daily. 06/26/23  Yes Caudle, Thersia Bitters, FNP  Glycopyrrolate-Formoterol (BEVESPI  AEROSPHERE) 9-4.8 MCG/ACT AERO Inhale 2 puffs into the lungs 2 (two) times daily. 07/14/23  Yes Kara Dorn NOVAK, MD  ipratropium-albuterol  (DUONEB) 0.5-2.5 (3) MG/3ML SOLN Take 3 mLs by nebulization every 4 (four) hours as needed (shortness of breath, wheezing, or cough). 10/16/23  Yes Caudle, Thersia Bitters, FNP  levofloxacin  (LEVAQUIN ) 750 MG tablet Take 1 tablet (750 mg total)  by mouth daily. 10/22/23  Yes Kara Dorn NOVAK, MD  levothyroxine  (SYNTHROID ) 88 MCG tablet Take 1 tablet (88 mcg total) by mouth daily at 6 (six) AM. 10/20/23  Yes Caudle, Thersia Bitters, FNP  nystatin cream (MYCOSTATIN) Apply topically. 10/15/23  Yes [provider]  promethazine  (PHENERGAN ) 25 MG tablet TAKE 1 TABLET BY MOUTH EVERY 8 HOURS AS NEEDED FOR NAUSEA OR VOMITING. 10/29/23  Yes Caudle, Thersia Bitters, FNP  propranolol  (INDERAL ) 80 MG tablet Take 80 mg by mouth once.   Yes [provider]  traZODone  (DESYREL ) 100 MG tablet Take 0.5 tablets (50 mg total) by mouth at bedtime. 10/16/23  Yes Caudle, Thersia Bitters, FNP  furosemide  (LASIX ) 20 MG tablet Take 1 tablet (20 mg total) by mouth daily for 5 days. Patient taking differently: Take 20 mg by mouth daily as needed for fluid or edema. 05/29/23 10/22/23  Knute Thersia Bitters, FNP     Sammi Gore, PA - [facility] For pager details, please see AMION or use Epic chat   After 1900, please call [facility] for cross coverage needs 11/04/2023, 10:11 AM

## 2023-11-04 NOTE — Op Note (Signed)
 Bronchoscopy Procedure Note  Candee Hoon  [id number]  [birth year]  Date:11/04/23  Time:8:20 AM   Provider Performing:Dosia Yodice B Cadey Bazile   Procedure(s):  Flexible bronchoscopy with bronchial alveolar lavage (68375)  Indication(s) Recurrent Pneumonia  Consent Risks of the procedure as well as the alternatives and risks of each were explained to the patient and/or caregiver.  Consent for the procedure was obtained and is signed in the bedside chart  Anesthesia General   Time Out Verified patient identification, verified procedure, site/side was marked, verified correct patient position, special equipment/implants available, medications/allergies/relevant history reviewed, required imaging and test results available.   Sterile Technique Usual hand hygiene, masks, gowns, and gloves were used   Procedure Description Bronchoscope advanced through endotracheal tube and into airway.  Airways were examined down to subsegmental level with findings noted below.   Following diagnostic evaluation, BAL(s) performed in RUL, posterior segment with normal saline and return of 30mL fluid  Findings:  - Significant purulent secretions noted in right and left mainstem bronchi, suctioned to clarity - Excessive dynamic airway collapse of the poster segment of the right upper lobe - Normal appearing mucosa of right upper lobe posterior segment, no endobronchial lesions noted - View past the posterior segmental entrance again showed normal mucosa and no endobronchial lesions - Concern for excessive dynamic airway collapse of the posterior segment of the right upper lobe leading to recurrent pneumonias - BAL sent for culture, cytology, cell count    Complications/Tolerance None; patient tolerated the procedure well. Chest X-ray is not needed post procedure.   EBL Minimal   Specimen(s) BAL

## 2023-11-04 NOTE — Anesthesia Postprocedure Evaluation (Signed)
 Anesthesia Post Note  Patient: Sabrina Roberson  Procedure(s) Performed: BRONCHOSCOPY, FLEXIBLE (Bilateral) IRRIGATION, BRONCHUS     Patient location during evaluation: PACU Anesthesia Type: General Level of consciousness: awake Pain management: pain level controlled Vital Signs Assessment: post-procedure vital signs reviewed and stable Respiratory status: spontaneous breathing, nonlabored ventilation and respiratory function stable (patient on BiPAP) Cardiovascular status: blood pressure returned to baseline and stable Postop Assessment: no apparent nausea or vomiting Anesthetic complications: no Comments: The patient was taking regular and adequate tidal volumes on emergence of anesthesia. She was following commands. Vital signs were stable with the exception of an elevated etCO2. The patient was extubated and connected to BiPAP immediately upon arrival to PACU. The patient is resting comfortably in PACU. Vital signs remain stable. Patient able to answer all questions. etCO2 hovering the low 50s and will drop to upper 40s while on BiPAP. Patient seen by critical care medicine and will be admitted overnight with the goal of weaning off of BiPAP.   No notable events documented.  Last Vitals:  Vitals:   11/04/23 1115 11/04/23 1130  BP: 103/63 (!) 107/57  Pulse: (!) 54 (!) 55  Resp: 13 15  Temp:    SpO2: 97% 97%    Last Pain:  Vitals:   11/04/23 1130  TempSrc:   PainSc: 0-No pain                 Delon Aisha Arch

## 2023-11-05 ENCOUNTER — Other Ambulatory Visit (HOSPITAL_COMMUNITY): Payer: Self-pay

## 2023-11-05 DIAGNOSIS — J9811 Atelectasis: Secondary | ICD-10-CM

## 2023-11-05 DIAGNOSIS — J9601 Acute respiratory failure with hypoxia: Secondary | ICD-10-CM

## 2023-11-05 DIAGNOSIS — J9602 Acute respiratory failure with hypercapnia: Secondary | ICD-10-CM

## 2023-11-05 LAB — MAGNESIUM: Magnesium: 1.8 mg/dL (ref 1.7–2.4)

## 2023-11-05 LAB — CBC
HCT: 34.3 % — ABNORMAL LOW (ref 36.0–46.0)
Hemoglobin: 10.4 g/dL — ABNORMAL LOW (ref 12.0–15.0)
MCH: 28.8 pg (ref 26.0–34.0)
MCHC: 30.3 g/dL (ref 30.0–36.0)
MCV: 95 fL (ref 80.0–100.0)
Platelets: 207 K/uL (ref 150–400)
RBC: 3.61 MIL/uL — ABNORMAL LOW (ref 3.87–5.11)
RDW: 13.2 % (ref 11.5–15.5)
WBC: 3.2 K/uL — ABNORMAL LOW (ref 4.0–10.5)
nRBC: 0 % (ref 0.0–0.2)

## 2023-11-05 LAB — BASIC METABOLIC PANEL WITH GFR
Anion gap: 6 (ref 5–15)
BUN: 7 mg/dL (ref 6–20)
CO2: 33 mmol/L — ABNORMAL HIGH (ref 22–32)
Calcium: 8.3 mg/dL — ABNORMAL LOW (ref 8.9–10.3)
Chloride: 101 mmol/L (ref 98–111)
Creatinine, Ser: 0.94 mg/dL (ref 0.44–1.00)
GFR, Estimated: >60 mL/min (ref >60)
Glucose, Bld: 99 mg/dL (ref 70–99)
Potassium: 3.4 mmol/L — ABNORMAL LOW (ref 3.5–5.1)
Sodium: 140 mmol/L (ref 135–145)

## 2023-11-05 LAB — GLUCOSE, CAPILLARY
Glucose-Capillary: 108 mg/dL — ABNORMAL HIGH (ref 70–99)
Glucose-Capillary: 110 mg/dL — ABNORMAL HIGH (ref 70–99)
Glucose-Capillary: 89 mg/dL (ref 70–99)
Glucose-Capillary: 89 mg/dL (ref 70–99)

## 2023-11-05 LAB — PHOSPHORUS: Phosphorus: 3.1 mg/dL (ref 2.5–4.6)

## 2023-11-05 MED ORDER — AZITHROMYCIN 500 MG PO TABS: 500.0000 mg | ORAL_TABLET | Freq: Every day | ORAL | 0 refills | Status: DC

## 2023-11-05 MED ORDER — AZITHROMYCIN 250 MG PO TABS
500.0000 mg | ORAL_TABLET | Freq: Every day | ORAL | Status: DC
Start: 2023-11-05 — End: 2023-11-05
  Administered 2023-11-05: 500 mg via ORAL
  Filled 2023-11-05: qty 2

## 2023-11-05 MED ORDER — PREDNISONE 20 MG PO TABS: 40.0000 mg | ORAL_TABLET | Freq: Every day | ORAL | 0 refills | Status: AC

## 2023-11-05 MED ORDER — AZITHROMYCIN 500 MG PO TABS: 500.0000 mg | ORAL_TABLET | Freq: Every day | ORAL | 0 refills | Status: AC

## 2023-11-05 MED ORDER — AZITHROMYCIN 500 MG PO TABS: 500.0000 mg | ORAL_TABLET | Freq: Every day | ORAL | Status: DC

## 2023-11-05 MED ORDER — METHYLPREDNISOLONE SODIUM SUCC 40 MG IJ SOLR
40.0000 mg | Freq: Once | INTRAMUSCULAR | Status: AC
  Administered 2023-11-05: 40 mg via INTRAVENOUS
  Filled 2023-11-05: qty 1

## 2023-11-05 NOTE — Plan of Care (Signed)
  Problem: Coping: Goal: Ability to adjust to condition or change in health will improve Outcome: Progressing   Problem: Health Behavior/Discharge Planning: Goal: Ability to identify and utilize available resources and services will improve Outcome: Progressing   Problem: Nutritional: Goal: Maintenance of adequate nutrition will improve Outcome: Progressing   Problem: Nutrition: Goal: Adequate nutrition will be maintained Outcome: Progressing

## 2023-11-05 NOTE — Discharge Summary (Addendum)
 Physician Discharge Summary  Patient ID: Sabrina Roberson MRN: [id number] DOB/AGE: Mar 14, 1970 53 y.o.  Admit date: 11/04/2023 Discharge date: 11/05/2023    Discharge Diagnoses:  Acute hypoxic Hypercapnic respiratory failure post anesthesia  Right middle lobe dynamic collapse (concern for RML syndrome)                                                              DISCHARGE SUMMARY   58 F with hx of diabetes, GERD, hypertension, COPD/asthma, recurrent pneumonias with stenotrophomonas and imaging suggestive of a persistent right middle lobe/right upper lobe scar, possible right middle lobe syndrome. She has had bronchoscopy in the past that showed narrowing of the right middle lobe orifice. She underwent repeat bronchoscopy on 11/04/2023 which was uncomplicated. Purulent secretions were suctioned and BAL was performed. Postprocedure she is hypercapnic and hypersomnolent and BiPAP was initiated. She was weaned off BIPAP and has been doing well. Weaned to room air and did well on ambulation.  Continues to have bilateral rales and rhonchi, advised to continue home treatments with IS and flutter valve. She will continue to use her neb treatments as well as Bevespi . BAL stains showing WBCs without any organisms. I will request a close follow up for her in the clinic                          SIGNIFICANT DIAGNOSTIC STUDIES Bronchoscopy  - Significant purulent secretions noted in right and left mainstem bronchi, suctioned to clarity - Excessive dynamic airway collapse of the poster segment of the right upper lobe - Normal appearing mucosa of right upper lobe posterior segment, no endobronchial lesions noted - View past the posterior segmental entrance again showed normal mucosa and no endobronchial lesions - Concern for excessive dynamic airway collapse of the posterior segment of the right upper lobe leading to recurrent pneumonias - BAL sent for culture, cytology, cell  count       Discharge Exam: General:Alert and oriented , no distress Neuro: No focal deficits, motor and sensation intact  CV: RRR, Nl S1, S2 PULM: Bilateral Rales and rhonchi  GI: Soft, NT, ND Extremities: warm, well perfused   Vitals:   11/05/23 0358 11/05/23 0707 11/05/23 0741 11/05/23 1137  BP: 104/63 (!) 104/52  (!) 94/45  Pulse: (!) 53   61  Resp: 16 18  18   Temp: 98 F (36.7 C) 98 F (36.7 C)  98.5 F (36.9 C)  TempSrc:  Oral  Oral  SpO2: 99%  100% 100%  Weight:      Height:         Discharge Labs  BMET Recent Labs  Lab 11/02/23 1420 11/05/23 0240  NA 146* 140  K 4.0 3.4*  CL 107 101  CO2 30 33*  GLUCOSE 86 99  BUN 6 7  CREATININE 1.09* 0.94  CALCIUM  8.9 8.3*  MG  --  1.8  PHOS  --  3.1    CBC Recent Labs  Lab 11/02/23 1420 11/05/23 0240  HGB 11.3* 10.4*  HCT 37.1 34.3*  WBC 3.6* 3.2*  PLT 251 207    Anti-Coagulation No results for input(s): INR in the last 168 hours.      Follow-up Information     Kara Dorn NOVAK, MD Follow  up.   Specialty: Pulmonary Disease Contact information: [address] [place] [address] [phone]         Prescription assisstance. Follow up.   Why: -Download app to find which pharmacys is the cheapest for your prescriptions. - Ask Pharmaist if there are any financial assistance for your expensive prescriptions.                 Allergies as of 11/05/2023       Reactions   Latex Anaphylaxis, Swelling   Tape Itching, Rash   Please use paper tape only. Will pull off skin if not.   Codeine Nausea And Vomiting   GI Upset (intolerance), Vomiting (intolerance)   Oxycodone  Nausea And Vomiting   Pre-medication with phenergan  needed.        Medication List     TAKE these medications    albuterol  108 (90 Base) MCG/ACT inhaler Commonly known as: VENTOLIN  HFA TAKE 2 PUFFS BY MOUTH EVERY 6 HOURS AS NEEDED FOR WHEEZE OR SHORTNESS OF BREATH   ARIPiprazole   5 MG tablet Commonly known as: ABILIFY  Take 5 mg by mouth daily.   atorvastatin  20 MG tablet Commonly known as: LIPITOR Take 1 tablet (20 mg total) by mouth at bedtime.   azithromycin  500 MG tablet Commonly known as: Zithromax  Take 1 tablet (500 mg total) by mouth daily for 3 days. Start taking on: November 06, 2023   Bevespi  Aerosphere 9-4.8 MCG/ACT Aero Generic drug: Glycopyrrolate-Formoterol Inhale 2 puffs into the lungs 2 (two) times daily.   diazepam  5 MG tablet Commonly known as: VALIUM  TAKE 1 TABLET (5 MG TOTAL) BY MOUTH TWICE A DAY AS NEEDED FOR ANXIETY   esomeprazole 20 MG capsule Commonly known as: NEXIUM Take 20 mg by mouth daily at 12 noon.   furosemide  20 MG tablet Commonly known as: LASIX  Take 1 tablet (20 mg total) by mouth daily for 5 days. What changed:  when to take this reasons to take this   gabapentin  600 MG tablet Commonly known as: NEURONTIN  TAKE 1 TABLET BY MOUTH AT BEDTIME. What changed: when to take this   ibuprofen 200 MG tablet Commonly known as: ADVIL Take 800 mg by mouth every 6 (six) hours as needed for mild pain (pain score 1-3) or moderate pain (pain score 4-6).   ipratropium-albuterol  0.5-2.5 (3) MG/3ML Soln Commonly known as: DUONEB Take 3 mLs by nebulization every 4 (four) hours as needed (shortness of breath, wheezing, or cough).   levofloxacin  750 MG tablet Commonly known as: LEVAQUIN  Take 1 tablet (750 mg total) by mouth daily.   levothyroxine  88 MCG tablet Commonly known as: SYNTHROID  Take 1 tablet (88 mcg total) by mouth daily at 6 (six) AM.   nystatin cream Commonly known as: MYCOSTATIN Apply topically.   predniSONE  20 MG tablet Commonly known as: DELTASONE  Take 2 tablets (40 mg total) by mouth daily with breakfast for 4 days. Start taking on: November 06, 2023   promethazine  25 MG tablet Commonly known as: PHENERGAN  TAKE 1 TABLET BY MOUTH EVERY 8 HOURS AS NEEDED FOR NAUSEA OR VOMITING.   propranolol  80 MG  tablet Commonly known as: INDERAL  Take 80 mg by mouth daily.   traZODone  100 MG tablet Commonly known as: DESYREL  Take 0.5 tablets (50 mg total) by mouth at bedtime.           Disposition:   Discharged Condition: Sabrina Roberson has met maximum benefit of inpatient care and is medically stable and cleared for  discharge.  Patient is pending follow up as above.      Time spent on disposition:  21 Minutes.    Zola Herter, MD [facility] Office: [phone]   See Amion for personal pager PCCM on call pager [phone] until 7pm. Please call [facility] 7p-7a. [phone]    Please see [email or web address] for pager details.

## 2023-11-05 NOTE — Progress Notes (Signed)
 Discharge Nurse Summary: DC order noted per MD. DC RN at bedside with patient. Patient agreeable with discharge plan, husband at the bedside. AVS printed/reviewed. PIV removed, skin intact. No DME needs. No home/TOC meds. Prescriptions provided upon request by the provider. Patient instructed to pickup from preferred Rx. CP/Edu resolved. Telemonitor not present on assessment. All belongings accounted for. Patient wheeled downstairs for discharge by private auto.   Rosario EMERSON Lund, RN

## 2023-11-05 NOTE — TOC Transition Note (Signed)
 Transition of Care [facility]) - Discharge Note   Patient Details  Name: Sabrina Roberson MRN: [id number] Date of Birth: 1970-05-24  Transition of [facility]) CM/SW Contact:  Roxie KANDICE Stain, RN Phone Number: 11/05/2023, 1:06 PM   Clinical Narrative:    Sabrina Roberson is stable to discharge home. Follow up apt on AVS. Spoke to patient regarding medication assistance. Resources added to AVS. Patient has transportation. PCP confirmed.     Final next level of care: Home/Self Care Barriers to Discharge: Barriers Resolved   Patient Goals and CMS Choice Patient states their goals for this hospitalization and ongoing recovery are:: return home          Discharge Placement               Home        Discharge Plan and Services Additional resources added to the After Visit Summary for                                       Social Drivers of Health (SDOH) Interventions SDOH Screenings   Food Insecurity: No Food Insecurity (11/04/2023)  Housing: Low Risk  (11/04/2023)  Transportation Needs: No Transportation Needs (11/04/2023)  Utilities: Not At Risk (11/04/2023)  Alcohol Screen: Low Risk  (10/10/2022)  Depression (PHQ2-9): Medium Risk (10/16/2023)  Financial Resource Strain: Low Risk  (03/02/2023)  Physical Activity: Inactive (03/02/2023)  Social Connections: Moderately Isolated (11/04/2023)  Stress: Stress Concern Present (03/02/2023)  Tobacco Use: Medium Risk (11/04/2023)  Health Literacy: Adequate Health Literacy (10/10/2022)     Readmission Risk Interventions    11/05/2023    1:05 PM 09/05/2023    2:12 PM 01/22/2023    8:55 AM  Readmission Risk Prevention Plan  Transportation Screening Complete Complete Complete  PCP or Specialist Appt within 5-7 Days Complete    Home Care Screening Complete    Medication Review (RN CM) Complete    HRI or Home Care Consult  Complete Complete  Social Work Consult for Recovery Care Planning/Counseling  Complete Complete  Palliative  Care Screening  Not Applicable   Medication Review Oceanographer)  Complete Complete

## 2023-11-06 ENCOUNTER — Telehealth (HOSPITAL_COMMUNITY): Payer: Self-pay

## 2023-11-06 ENCOUNTER — Other Ambulatory Visit (HOSPITAL_COMMUNITY)
Admission: RE | Admit: 2023-11-06 | Discharge: 2023-11-06 | Disposition: A | Discharge status: Home / Self Care | Source: Ambulatory Visit | Attending: Emergency Medicine | Admitting: Emergency Medicine

## 2023-11-06 DIAGNOSIS — J189 Pneumonia, unspecified organism: Secondary | ICD-10-CM | POA: Diagnosis present

## 2023-11-06 DIAGNOSIS — J479 Bronchiectasis, uncomplicated: Secondary | ICD-10-CM | POA: Insufficient documentation

## 2023-11-06 LAB — CYTOLOGY - NON PAP

## 2023-11-06 LAB — ACID FAST SMEAR (AFB, MYCOBACTERIA): Acid Fast Smear: NEGATIVE

## 2023-11-06 NOTE — Telephone Encounter (Signed)
 Added micro order to send out

## 2023-11-10 LAB — MISC LABCORP TEST (SEND OUT): Labcorp test code: 182261

## 2023-11-11 LAB — CULTURE, BAL-QUANTITATIVE W GRAM STAIN: Culture: 70000 — AB

## 2023-11-11 LAB — SUSCEPTIBILITY RESULT

## 2023-11-11 LAB — SUSCEPTIBILITY, AER + ANAEROB

## 2023-11-13 ENCOUNTER — Other Ambulatory Visit (HOSPITAL_BASED_OUTPATIENT_CLINIC_OR_DEPARTMENT_OTHER): Payer: Self-pay | Admitting: Family Medicine

## 2023-11-13 ENCOUNTER — Telehealth: Payer: Self-pay | Admitting: Pulmonary Disease

## 2023-11-13 DIAGNOSIS — J1569 Pneumonia due to other gram-negative bacteria: Secondary | ICD-10-CM

## 2023-11-13 LAB — AEROBIC/ANAEROBIC CULTURE W GRAM STAIN (SURGICAL/DEEP WOUND)

## 2023-11-13 MED ORDER — LEVOFLOXACIN 750 MG PO TABS: 750.0000 mg | ORAL_TABLET | Freq: Every day | ORAL | 0 refills | Status: DC

## 2023-11-13 NOTE — Telephone Encounter (Signed)
 Spoke with patient VBU.    Will pick up Rx, has both flutter valve and nebs, will contact immunology to set up appointment   -NFN

## 2023-11-13 NOTE — Telephone Encounter (Signed)
 Please let patient know her bronchoscopy culture is growing out a bacteria called achromobacter. I have sent in a prescription of levaquin  750mg  1 tab daily for 14 days.  Please ensure she has flutter valve and nebulizer treatments, to be used twice daily to clear mucous.  Please have her schedule an evaluation at the [facility] for evaluation of her immune system, they have sent her a MyChart message.  Thanks, Dorn Chill, MD [facility] Office: [phone]   See Amion for personal pager PCCM on call pager [phone] until 7pm. Please call Elink 7p-7a. [phone]

## 2023-11-18 ENCOUNTER — Other Ambulatory Visit (HOSPITAL_BASED_OUTPATIENT_CLINIC_OR_DEPARTMENT_OTHER): Payer: Self-pay | Admitting: Family Medicine

## 2023-11-18 NOTE — Telephone Encounter (Signed)
 Alexis, please advise on the metformin  Rx.

## 2023-11-19 ENCOUNTER — Institutional Professional Consult (permissible substitution) (INDEPENDENT_AMBULATORY_CARE_PROVIDER_SITE_OTHER): Admitting: Otolaryngology

## 2023-11-20 ENCOUNTER — Ambulatory Visit: Admitting: Pulmonary Disease

## 2023-11-20 ENCOUNTER — Encounter: Payer: Self-pay | Admitting: Pulmonary Disease

## 2023-11-20 VITALS — BP 116/59 | HR 57 | Ht 64.0 in | Wt 150.0 lb

## 2023-11-20 DIAGNOSIS — J9809 Other diseases of bronchus, not elsewhere classified: Secondary | ICD-10-CM | POA: Diagnosis not present

## 2023-11-20 DIAGNOSIS — J479 Bronchiectasis, uncomplicated: Secondary | ICD-10-CM

## 2023-11-20 DIAGNOSIS — J1569 Pneumonia due to other gram-negative bacteria: Secondary | ICD-10-CM

## 2023-11-20 NOTE — Patient Instructions (Addendum)
 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

## 2023-11-20 NOTE — Progress Notes (Unsigned)
 Synopsis: hx or MRSA pneumonia  Subjective:   PATIENT ID: Sabrina Roberson GENDER: female DOB: [birth year], MRN: [id number]  HPI  Chief Complaint  Patient presents with   Medical Management of Chronic Issues    Pt states dark green sputum    Sabrina Roberson is a 53 year old woman, former smoker with DMII, GERD, depression/anxiety, hypertension, CKD and asthma who returns to pulmonary clinic for recurrent pneumonias.  OV 06/10/23 She was last seen by Izetta Rouleau, NP 03/17/23. Sputum cultures have been grown MRSA since 01/20/23. Most recent sputum sample collected 03/17/23. She was placed on a 14 day course of linezolid  starting on 03/20/23.   She continues to have productive cough with green sputum. She experiences cold chills but no fever. Recent dental work has led to difficulty swallowing, complicating chewing and swallowing.  She quit smoking 12/2022. She has been on disability.  OV 07/14/23 She has a persistent cough with thick, brown, greenish-yellow sputum, worsened by heat and humidity, and a sensation of airway closure during coughing. A crackling sensation in her chest is present. She experiences chills but is unsure about fever. Her voice becomes raspy and stops during speech, particularly when ordering. She has not smoked since December after a hospitalization. Her family history includes emphysema on her mother's side.  She uses an albuterol  inhaler as needed but has not used her hypertonic saline treatment since starting antibiotics. She has not used the Anoro inhaler due to insurance issues. She previously used prednisone  but experienced significant jitteriness. Swelling in her arm has improved with antibiotics, though some soreness persists.  OV 10/22/23 She continues to experience chills without fever and often feels cold. During her recent hospitalization for pneumonia, cultures revealed MRSA and stenotrophomonas. She completed a five-day course of antibiotics at home. She denies  hemoptysis but has green and yellow sputum production. She uses a flutter valve and has resumed nebulizer treatments. She is also using a Bevespi  inhaler.  Previous bronchoscopy did not reveal significant abnormalities but narrowing of RML entrance. She experiences a sensation of things getting stuck in her throat when swallowing, which sometimes causes her to feel 'choked up'. She has been on a ventilator twice in the past, with the second time being a prolonged process.  History and imaging reviewed with colleagues, concern for right middle lobe syndrome vs immunodeficiency vs dysphagia/aspiration risk are included as differentials.  Past Medical History:  Diagnosis Date   Abdominal pain 02/11/2011   Abnormal CT of the chest 02/04/2014   CT chest 08/2013:  atx in RML and lingula  CT angio 12/2013:  No PE, RML scarring, abnormal density in lingula  (done at [facility])     Allergy     AMS (altered mental status) 09/03/2023   Anxiety    Arthritis    Asthma    Bruises easily    Chronic chest wall pain    Chronic pain    Cough    Depression    Diabetes mellitus without complication (HCC)    Dyspnea    chronic   Dysuria 09/18/2022   Encounter for therapeutic drug monitoring 02/11/2011   Fibromyalgia    Functional movement disorder    GERD (gastroesophageal reflux disease)    Headache    Hearing loss    High cholesterol    History of hiatal hernia    Hypertension    Hypothyroidism    Kidney disease    stage 3   Leg swelling    both  Localized swelling of both lower legs 05/29/2023   Nausea 02/11/2011   Nausea and vomiting 04/01/2013   Pain management    Paranoid behavior (HCC) 07/08/2023   Sebaceous cyst of breast, right subaereolar 10/30/2010   Septic shock (HCC) 01/19/2023   SMAS (superior mesenteric artery syndrome)    Thyroid  disease    TIA (transient ischemic attack)    Tic disorder    TMJ (dislocation of temporomandibular joint)    Transient cerebral ischemia     Type 2 diabetes mellitus with complications (HCC)      Family History  Problem Relation Age of Onset   Asthma Father    Breast cancer Sister    Hypertension Sister    Parkinson's disease Maternal Aunt    Pulmonary fibrosis Maternal Grandmother    Emphysema Maternal Grandmother    Heart disease Maternal Grandmother    Breast cancer Maternal Grandmother    Asthma Maternal Grandfather    Heart disease Maternal Grandfather    Cancer Paternal Grandmother        breast   Heart disease Paternal Grandmother    Breast cancer Paternal Grandmother    Asthma Paternal Grandmother    Leukemia Paternal Grandfather    Heart disease Paternal Grandfather    Clotting disorder Paternal Grandfather      Social History   Socioeconomic History   Marital status: Married    Spouse name: Not on file   Number of children: 2   Years of education: Not on file   Highest education level: 12th grade  Occupational History   Occupation: disabled  Tobacco Use   Smoking status: Former    Current packs/day: 0.00    Average packs/day: 0.5 packs/day for 15.0 years (7.5 ttl pk-yrs)    Types: Cigarettes    Quit date: 01/19/2023    Years since quitting: 0.8   Smokeless tobacco: Never  Vaping Use   Vaping status: Former  Substance and Sexual Activity   Alcohol use: No   Drug use: No   Sexual activity: Not Currently    Partners: Male    Birth control/protection: None  Other Topics Concern   Not on file  Social History Narrative   Not on file   Social Drivers of Health   Financial Resource Strain: Low Risk  (03/02/2023)   Overall Financial Resource Strain (CARDIA)    Difficulty of Paying Living Expenses: Not hard at all  Food Insecurity: No Food Insecurity (11/04/2023)   Hunger Vital Sign    Worried About Running Out of Food in the Last Year: Never true    Ran Out of Food in the Last Year: Never true  Transportation Needs: No Transportation Needs (11/04/2023)   PRAPARE - Doctor, general practice (Medical): No    Lack of Transportation (Non-Medical): No  Physical Activity: Inactive (03/02/2023)   Exercise Vital Sign    Days of Exercise per Week: 0 days    Minutes of Exercise per Session: 20 min  Stress: Stress Concern Present (03/02/2023)   [employer] of Occupational Health - Occupational Stress Questionnaire    Feeling of Stress : Very much  Social Connections: Moderately Isolated (11/04/2023)   Social Connection and Isolation Panel    Frequency of Communication with Friends and Family: Three times a week    Frequency of Social Gatherings with Friends and Family: Once a week    Attends Religious Services: Never    Database administrator or Organizations: No  Attends Banker Meetings: Never    Marital Status: Married  Catering manager Violence: Not At Risk (11/04/2023)   Humiliation, Afraid, Rape, and Kick questionnaire    Fear of Current or Ex-Partner: No    Emotionally Abused: No    Physically Abused: No    Sexually Abused: No     Allergies  Allergen Reactions   Latex Anaphylaxis and Swelling   Tape Itching and Rash    Please use paper tape only. Will pull off skin if not.   Codeine Nausea And Vomiting    GI Upset (intolerance), Vomiting (intolerance)   Oxycodone  Nausea And Vomiting    Pre-medication with phenergan  needed.     Outpatient Medications Prior to Visit  Medication Sig Dispense Refill   albuterol  (VENTOLIN  HFA) 108 (90 Base) MCG/ACT inhaler TAKE 2 PUFFS BY MOUTH EVERY 6 HOURS AS NEEDED FOR WHEEZE OR SHORTNESS OF BREATH 8.5 each 2   amLODipine  (NORVASC ) 5 MG tablet Take 5 mg by mouth daily.     ARIPiprazole  (ABILIFY ) 5 MG tablet Take 5 mg by mouth daily.     atorvastatin  (LIPITOR) 20 MG tablet TAKE 1 TABLET BY MOUTH EVERYDAY AT BEDTIME 90 tablet 3   diazepam  (VALIUM ) 5 MG tablet TAKE 1 TABLET (5 MG TOTAL) BY MOUTH TWICE A DAY AS NEEDED FOR ANXIETY 30 tablet 2   esomeprazole (NEXIUM) 20 MG capsule Take 20 mg by mouth  daily at 12 noon.     gabapentin  (NEURONTIN ) 600 MG tablet TAKE 1 TABLET BY MOUTH AT BEDTIME. (Patient taking differently: Take 600 mg by mouth daily.) 60 tablet 3   Glycopyrrolate-Formoterol (BEVESPI  AEROSPHERE) 9-4.8 MCG/ACT AERO Inhale 2 puffs into the lungs 2 (two) times daily. 10.7 g 5   ibuprofen (ADVIL) 200 MG tablet Take 800 mg by mouth every 6 (six) hours as needed for mild pain (pain score 1-3) or moderate pain (pain score 4-6).     ipratropium-albuterol  (DUONEB) 0.5-2.5 (3) MG/3ML SOLN Take 3 mLs by nebulization every 4 (four) hours as needed (shortness of breath, wheezing, or cough). 360 mL 5   levofloxacin  (LEVAQUIN ) 750 MG tablet Take 1 tablet (750 mg total) by mouth daily. 14 tablet 0   levothyroxine  (SYNTHROID ) 88 MCG tablet Take 1 tablet (88 mcg total) by mouth daily at 6 (six) AM. 90 tablet 2   nystatin cream (MYCOSTATIN) Apply topically.     predniSONE  (DELTASONE ) 20 MG tablet Take 40 mg by mouth every morning.     promethazine  (PHENERGAN ) 25 MG tablet TAKE 1 TABLET BY MOUTH EVERY 8 HOURS AS NEEDED FOR NAUSEA OR VOMITING. 30 tablet 3   propranolol  (INDERAL ) 80 MG tablet TAKE 1 TABLET BY MOUTH EVERY DAY 90 tablet 3   traZODone  (DESYREL ) 100 MG tablet Take 0.5 tablets (50 mg total) by mouth at bedtime.     furosemide  (LASIX ) 20 MG tablet Take 1 tablet (20 mg total) by mouth daily for 5 days. (Patient not taking: Reported on 11/20/2023) 30 tablet 3   No facility-administered medications prior to visit.    Review of Systems  Constitutional:  Positive for chills. Negative for fever, malaise/fatigue and weight loss.  HENT:  Negative for congestion, sinus pain and sore throat.   Eyes: Negative.   Respiratory:  Positive for cough, sputum production and shortness of breath. Negative for hemoptysis and wheezing.   Cardiovascular:  Negative for chest pain, palpitations, orthopnea, claudication and leg swelling.  Gastrointestinal:  Negative for abdominal pain, heartburn, nausea and  vomiting.  Genitourinary: Negative.   Musculoskeletal:  Negative for joint pain and myalgias.  Skin:  Negative for rash.  Neurological:  Negative for weakness.  Endo/Heme/Allergies: Negative.   Psychiatric/Behavioral: Negative.      Objective:   Vitals:   11/20/23 1322  BP: (!) 116/59  Pulse: (!) 57  SpO2: 95%  Weight: 150 lb (68 kg)  Height: 5' 4 (1.626 m)    Physical Exam Constitutional:      General: She is not in acute distress.    Appearance: Normal appearance.  Eyes:     General: No scleral icterus.    Conjunctiva/sclera: Conjunctivae normal.  Cardiovascular:     Rate and Rhythm: Normal rate and regular rhythm.  Pulmonary:     Breath sounds: Rales (scattered on right) present. No wheezing or rhonchi.  Musculoskeletal:     Right lower leg: No edema.     Left lower leg: No edema.  Skin:    General: Skin is warm and dry.  Neurological:     General: No focal deficit present.    CBC    Component Value Date/Time   WBC 3.2 (L) 11/05/2023 0240   RBC 3.61 (L) 11/05/2023 0240   HGB 10.4 (L) 11/05/2023 0240   HGB 11.6 10/16/2023 1119   HCT 34.3 (L) 11/05/2023 0240   HCT 37.6 10/16/2023 1119   PLT 207 11/05/2023 0240   PLT 330 10/16/2023 1119   MCV 95.0 11/05/2023 0240   MCV 94 10/16/2023 1119   MCH 28.8 11/05/2023 0240   MCHC 30.3 11/05/2023 0240   RDW 13.2 11/05/2023 0240   RDW 13.5 10/16/2023 1119   LYMPHSABS 1.6 10/16/2023 1119   MONOABS 0.3 09/08/2023 0524   EOSABS 0.1 10/16/2023 1119   BASOSABS 0.0 10/16/2023 1119     Chest imaging: CT Chest 09/02/23 1. No pulmonary embolism. 2. Progressive atelectasis in the right middle lobe and new 1.8 cm focal airspace infiltrate centrally in the left lower lobe.  CT Chest 05/16/23 1. New 2.3 x 1.6 cm masslike abnormality in the azygoesophageal recess area, could indicate an area of new right lower lobe pulmonary consolidation, mass or an abnormal lymph node. Per Fleischner Society guidelines, recommend  tissue sampling or PET-CT. This the recommendation follows the consensus statement: Guidelines for Management of Incidental Pulmonary Nodules Detected on CT Images: From the Fleischner Society 2017; Radiology 2017; 284:228-243. 2. New patchy ground-glass disease in the medial basal right lower lobe, probably due to pneumonitis. 3. New loosely clustered nodules in the posterior basal left lower lobe ranging from 3 mm up to 8 mm in size. Given the short interval appearance, these are probably inflammatory nodules but will require follow-up. Non-contrast chest CT at 3-6 months is recommended. If the nodules are stable at time of repeat CT, then future CT at 18-24 months (from today's scan) is considered optional for low-risk patients, but is recommended for high-risk patients. This recommendation follows the consensus statement: Guidelines for Management of Incidental Pulmonary Nodules Detected on CT Images: From the Fleischner Society 2017; Radiology 2017; 284:228-243. 4. Diffuse bronchial thickening, mild bronchiectasis in the lower lobes and in the perihilar areas in the upper lobes. Bilateral bandlike opacities most likely scarring change. 5. Faint scattered ground-glass opacities in areas of previous dense consolidation, most likely representing ground-glass postpneumonic fibrosis. 6. Prominent pulmonary trunk indicating arterial hypertension. 7. Aortic atherosclerosis. 8. Osteopenia.  PFT:    Latest Ref Rng & Units 12/28/2013   12:53 PM 12/01/2013   12:06 PM  PFT Results  FVC-Pre L 3.44  2.60   FVC-Predicted Pre % 93  70   FVC-Post L 3.21    FVC-Predicted Post % 87    Pre FEV1/FVC % % 81  87   Post FEV1/FCV % % 81    FEV1-Pre L 2.79  2.26   FEV1-Predicted Pre % 93  75   FEV1-Post L 2.61      Labs:  Path:  Echo:  Heart Catheterization:       Assessment & Plan:   No diagnosis found.  Discussion: Sabrina Roberson is a 53 year old woman, former smoker with DMII,  GERD, depression/anxiety, hypertension, CKD and asthma who returns to pulmonary clinic for recurrent pneumonias.  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 Patient should use of standardized swallowing precautions (small bites/sips via cup edge, sitting upright during PO) and intermittent throat clears. Recommend medications whole in puree.   Follow up in 3 month  Dorn Chill, MD  Pulmonary & Critical Care Office: [phone]   Current Outpatient Medications:    albuterol  (VENTOLIN  HFA) 108 (90 Base) MCG/ACT inhaler, TAKE 2 PUFFS BY MOUTH EVERY 6 HOURS AS NEEDED FOR WHEEZE OR SHORTNESS OF BREATH, Disp: 8.5 each, Rfl: 2   amLODipine  (NORVASC ) 5 MG tablet, Take 5 mg by mouth daily., Disp: , Rfl:    ARIPiprazole  (ABILIFY ) 5 MG tablet, Take 5 mg by mouth daily., Disp: , Rfl:    atorvastatin  (LIPITOR) 20 MG tablet, TAKE 1 TABLET BY MOUTH EVERYDAY AT BEDTIME, Disp: 90 tablet, Rfl: 3   diazepam  (VALIUM ) 5 MG tablet, TAKE 1 TABLET (5 MG TOTAL) BY MOUTH TWICE A DAY AS NEEDED FOR ANXIETY, Disp: 30 tablet, Rfl: 2   esomeprazole (NEXIUM) 20 MG capsule, Take 20 mg by mouth daily at 12 noon., Disp: , Rfl:    gabapentin  (NEURONTIN ) 600 MG tablet, TAKE 1 TABLET BY MOUTH AT BEDTIME. (Patient taking differently: Take 600 mg by mouth daily.), Disp: 60 tablet, Rfl: 3    Glycopyrrolate-Formoterol (BEVESPI  AEROSPHERE) 9-4.8 MCG/ACT AERO, Inhale 2 puffs into the lungs 2 (two) times daily., Disp: 10.7 g, Rfl: 5   ibuprofen (ADVIL) 200 MG tablet, Take 800 mg by mouth every 6 (six) hours as needed for mild pain (pain score 1-3) or moderate pain (pain score 4-6)., Disp: , Rfl:    ipratropium-albuterol  (DUONEB) 0.5-2.5 (3) MG/3ML SOLN, Take 3 mLs by nebulization every 4 (four) hours as needed (shortness of breath, wheezing, or cough)., Disp: 360 mL, Rfl: 5   levofloxacin  (LEVAQUIN ) 750 MG tablet, Take 1 tablet (750 mg total) by mouth daily., Disp: 14 tablet, Rfl: 0   levothyroxine  (SYNTHROID ) 88 MCG tablet, Take 1 tablet (88 mcg total) by mouth daily at 6 (six) AM., Disp: 90 tablet, Rfl: 2   nystatin cream (MYCOSTATIN), Apply topically., Disp: , Rfl:    predniSONE  (DELTASONE ) 20 MG tablet, Take 40 mg by mouth every morning., Disp: , Rfl:    promethazine  (PHENERGAN ) 25 MG tablet, TAKE  1 TABLET BY MOUTH EVERY 8 HOURS AS NEEDED FOR NAUSEA OR VOMITING., Disp: 30 tablet, Rfl: 3   propranolol  (INDERAL ) 80 MG tablet, TAKE 1 TABLET BY MOUTH EVERY DAY, Disp: 90 tablet, Rfl: 3   traZODone  (DESYREL ) 100 MG tablet, Take 0.5 tablets (50 mg total) by mouth at bedtime., Disp: , Rfl:    furosemide  (LASIX ) 20 MG tablet, Take 1 tablet (20 mg total) by mouth daily for 5 days. (Patient not taking: Reported on 11/20/2023), Disp: 30 tablet, Rfl: 3

## 2023-11-21 ENCOUNTER — Encounter: Payer: Self-pay | Admitting: Pulmonary Disease

## 2023-11-24 LAB — FUNGUS CULTURE RESULT

## 2023-11-24 LAB — FUNGUS CULTURE WITH STAIN

## 2023-11-24 LAB — FUNGAL ORGANISM REFLEX

## 2023-11-27 ENCOUNTER — Institutional Professional Consult (permissible substitution) (INDEPENDENT_AMBULATORY_CARE_PROVIDER_SITE_OTHER)

## 2023-12-01 ENCOUNTER — Telehealth: Payer: Self-pay

## 2023-12-01 NOTE — Telephone Encounter (Signed)
 Copied from CRM [id number]. Topic: Clinical - Order For Equipment >> Nov 28, 2023 11:48 AM Devaughn RAMAN wrote: Reason for CRM: Pt called in regards to chest vest. Pt stated she has not heard from anyone regarding the chest vest. Pt stated she only spoke with someone regarding her breathing treatment. Pt stated she received her breathing treatment, it got delivered on yesterday however she would like to f/u regarding the chest vest.

## 2023-12-01 NOTE — Telephone Encounter (Signed)
 Copied from CRM [id number]. Topic: Clinical - Order For Equipment >> Nov 28, 2023 11:48 AM Devaughn RAMAN wrote: Reason for CRM: Pt called in regards to chest vest. Pt stated she has not heard from anyone regarding the chest vest. Pt stated she only spoke with someone regarding her breathing treatment. Pt stated she received her breathing treatment, it got delivered on yesterday however she would like to f/u regarding the chest vest.    Spoke with pt VBU. will reach out to [facility] to find out about the order.  -NFN

## 2023-12-09 ENCOUNTER — Other Ambulatory Visit (HOSPITAL_BASED_OUTPATIENT_CLINIC_OR_DEPARTMENT_OTHER): Payer: Self-pay | Admitting: Family Medicine

## 2023-12-11 ENCOUNTER — Ambulatory Visit: Admitting: Allergy

## 2023-12-12 ENCOUNTER — Ambulatory Visit (HOSPITAL_BASED_OUTPATIENT_CLINIC_OR_DEPARTMENT_OTHER)

## 2023-12-16 ENCOUNTER — Telehealth: Payer: Self-pay

## 2023-12-16 DIAGNOSIS — B4489 Other forms of aspergillosis: Secondary | ICD-10-CM

## 2023-12-16 NOTE — Telephone Encounter (Signed)
 Hi Sabrina Roberson and [place],  I have reviewed her respiratory cultures again from the bronchoscopy.  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  I would like her to check a CMP lab (standing order placed) and start voriconazole per pharmacy dosing.   Thank you, Thom

## 2023-12-16 NOTE — Telephone Encounter (Signed)
 Copied from CRM [id number]. Topic: Clinical - Medical Advice >> Dec 16, 2023  2:58 PM Corean SAUNDERS wrote: Reason for CRM: Patient states she is still having the same symptoms her and Dr. Kara have been discussing and she wanted to let him know that when she coughs her sputum is still a mix of thick yellow, green, and brown. Patient is inquiring on if the doctor wants to send her in a medication.   *Patient not triaged as she states this has been an ongoing matter that the doctor is aware of*   ATC x1. LMTCB.

## 2023-12-16 NOTE — Telephone Encounter (Signed)
 Copied from CRM [id number]. Topic: Clinical - Medical Advice >> Dec 16, 2023  3:32 PM Joesph PARAS wrote: Reason for CRM: Patient is returning call to [facility]. Patient states Marcus was calling for more information but no documentation of what information was needed. Please return call to patient.    Called and spoke with the pt. Pt states she notices coughing w/ yellow phlegm, and SOB during exertion & activity. Pt states she was advised to let Dr. Kara know of this. Pt is using inhalers & meds correctly.  Routing to Dr. Kara.

## 2023-12-17 ENCOUNTER — Telehealth: Payer: Self-pay

## 2023-12-17 DIAGNOSIS — B4489 Other forms of aspergillosis: Secondary | ICD-10-CM

## 2023-12-17 NOTE — Telephone Encounter (Signed)
 Pt call main number for clinic.  Attempted to call [facility], but n/a.  Pt is going to [facility] for [place].  Pt would like to go to [facility] for her labs.  Order is for [facility].  Not sure if this is an issue, but pt will speak w/ Aleck first and have all questions answered and Dr Luann recommendations.

## 2023-12-17 NOTE — Telephone Encounter (Signed)
 Attempted to contact patient to perform medication review prior to starting voriconazole . LVM with my direct line to return call.   See telephone note 12/16/23. Most recent respiratory cultures from bronchoscopy on 11/04/23 grew aspergillus. Plan to check CMP lab and start voriconazole  per pharmacy dosing.   Per prescription fill history, will need to address drug interactions with aripiprazole  and trazodone . Staff message sent to PCP to discuss potential dose decrease of trazodone  when voriconazole  is started to avoid side effects. Attempted to reach psychiatry office where patient is prescribed aripiprazole . LVM with pharmacy coordinator with detailed message regarding drug interaction and request for return call to my office line at [phone].

## 2023-12-17 NOTE — Telephone Encounter (Signed)
 Tried to call patient (order has been updated )

## 2023-12-17 NOTE — Telephone Encounter (Addendum)
 Tried to reach out to patient VM / LM to return call    ( Needs to make a lab appointment please)

## 2023-12-17 NOTE — Telephone Encounter (Signed)
 Patient returned my call. Spouse present for phone conversation -   - Reviewed plan to start voriconazole  per Dr. Luann plan.  - Reviewed medication list to ensure we are not missing drug interactions.  - She plans to get labs drawn Friday (12/19/23) at [facility]. Lab orders placed. - Awaiting guidance from psychiatry regarding aripiprazole  Rx and PCP regarding trazodone  Rx due to drug interactions with voriconazole . Will likely need to decrease doses of these medications.  Plan to contact patient with lab results and when drug interactions addressed to start voriconazole .  Total time spent on phone with patient: 20 minutes

## 2023-12-17 NOTE — Telephone Encounter (Signed)
 Patient having labs drawn at [facility] - modified orders accordingly.

## 2023-12-17 NOTE — Telephone Encounter (Signed)
 ATC patient at [phone] to review medication list and address potential drug interactions prior to starting therapy. LVM with my direct line to return call - [phone]. Also left appointment scheduling line [phone] so she can call to schedule lab appointment for CMP.  From chart review, she is taking trazodone  100mg  nightly and aripiprazole  10mg  daily. Will need to contact prescribers to discuss dose modification when voriconazole is started.

## 2023-12-19 LAB — ACID FAST CULTURE WITH REFLEXED SENSITIVITIES (MYCOBACTERIA): Acid Fast Culture: NEGATIVE

## 2023-12-19 NOTE — Addendum Note (Signed)
 Addended by: TRUDY WARREN CROME on: 12/19/2023 10:24 AM   Modules accepted: Orders

## 2023-12-20 LAB — COMPREHENSIVE METABOLIC PANEL WITH GFR
ALT: 11 IU/L (ref 0–32)
AST: 18 IU/L (ref 0–40)
Albumin: 4 g/dL (ref 3.8–4.9)
Alkaline Phosphatase: 82 IU/L (ref 49–135)
BUN/Creatinine Ratio: 4 — ABNORMAL LOW (ref 9–23)
BUN: 4 mg/dL — ABNORMAL LOW (ref 6–24)
Bilirubin Total: <0.2 mg/dL (ref 0.0–1.2)
CO2: 28 mmol/L (ref 20–29)
Calcium: 9.2 mg/dL (ref 8.7–10.2)
Chloride: 104 mmol/L (ref 96–106)
Creatinine, Ser: 1 mg/dL (ref 0.57–1.00)
Globulin, Total: 2.1 g/dL (ref 1.5–4.5)
Glucose: 86 mg/dL (ref 70–99)
Potassium: 4.2 mmol/L (ref 3.5–5.2)
Sodium: 146 mmol/L — ABNORMAL HIGH (ref 134–144)
Total Protein: 6.1 g/dL (ref 6.0–8.5)
eGFR: 68 mL/min/1.73 (ref >59)

## 2023-12-22 ENCOUNTER — Ambulatory Visit: Attending: Pulmonary Disease

## 2023-12-22 DIAGNOSIS — Z5181 Encounter for therapeutic drug level monitoring: Secondary | ICD-10-CM

## 2023-12-22 DIAGNOSIS — J189 Pneumonia, unspecified organism: Secondary | ICD-10-CM

## 2023-12-22 DIAGNOSIS — F5101 Primary insomnia: Secondary | ICD-10-CM

## 2023-12-22 MED ORDER — TRAZODONE HCL 50 MG PO TABS: 25.0000 mg | ORAL_TABLET | Freq: Every evening | ORAL | 1 refills | Status: AC | PRN

## 2023-12-22 NOTE — Telephone Encounter (Signed)
 CMP resulted. LFTs wnl.   - Per staff message with PCP, okay to reduce trazodone  to 25mg  if patient is agreeable. Patient has been taking trazodone  100mg  despite advice to decrease to 50mg  at last PCP visit. If patient is not agreeable to reduce trazodone  to 25mg , ok to reduce trazodone  to 50mg  daily while on voriconazole . Will monitor for side effects closely. - Awaiting call from psychiatry office with direction on aripiprazole  dosing.   Called psychiatry office at [phone] to ask for clarification. I was forwarded to the pharmacy coordinator and left a VM.   Called patient to discuss updates. She reports she is no longer taking aripiprazole , off therapy for about 20 days. She was contacted by psychiatry office last week after my call and was offered an appointment, which she declined.   New start voriconazole  counseling completed in separate encounter - see pharmacotherapy visit note.

## 2023-12-22 NOTE — Progress Notes (Unsigned)
 [facility]  Referring Provider: Dr. Kara  Virtual Visit via Telephone Note  I connected with Sabrina Roberson on 12/22/23 at 09:00 AM by telephone and verified that I am speaking with the correct person using two identifiers.  Location: Patient: home Provider: office   I discussed the limitations, risks, security and privacy concerns of performing an evaluation and management service by telephone and the availability of in person appointments. I also discussed with the patient that there may be a patient responsible charge related to this service. The patient expressed understanding and agreed to proceed.  HPI: Sabrina Roberson is a 53 y.o. female who presents to the pharmacotherapy clinic via telephone for voriconazole  new start counseling. See phone note 12/16/23 - plan per Dr. Kara to start voriconazole . 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   Chronic cough 09/02/2023   Dysdiadochokinesia 09/02/2023   Pneumonia 09/02/2023   Severe episode of recurrent major depressive disorder, with psychotic features (HCC) 07/08/2023   Lymphadenopathy, axillary 07/08/2023   Recurrent pneumonia 06/27/2023   Bronchiectasis without complication (HCC) 06/10/2023   Pneumonia due to methicillin resistant Staphylococcus aureus (MRSA) (HCC) 04/03/2023   Physical deconditioning 02/17/2023   Bilateral lower extremity edema 12/03/2022   Bilateral carotid bruits 11/04/2022   CKD (chronic kidney disease) stage 2, GFR 60-89 ml/min 11/04/2022   Mixed hyperlipidemia 10/10/2022   GAD (generalized anxiety disorder) 10/10/2022   Acquired hypothyroidism 10/10/2022   Hypertension    Type 2 diabetes mellitus with diabetic chronic kidney disease (HCC)    Primary insomnia 09/18/2022   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   ALBUTEROL  (VENTOLIN  HFA) 108 (90 BASE) MCG/ACT INHALER    TAKE 2 PUFFS BY MOUTH EVERY 6 HOURS AS NEEDED FOR WHEEZE OR SHORTNESS OF BREATH   ARIPIPRAZOLE  (ABILIFY ) 5 MG TABLET    Take 5 mg by mouth daily.   ATORVASTATIN  (LIPITOR) 20 MG TABLET    TAKE 1 TABLET BY MOUTH EVERYDAY AT BEDTIME   DIAZEPAM  (VALIUM ) 5 MG TABLET    TAKE 1 TABLET (5 MG TOTAL) BY MOUTH TWICE A DAY AS NEEDED FOR ANXIETY   ESOMEPRAZOLE (NEXIUM) 20 MG CAPSULE    Take 20 mg by mouth daily at 12 noon.   FUROSEMIDE  (LASIX ) 20 MG TABLET    Take 1 tablet (20 mg total) by mouth daily for 5 days.   GABAPENTIN  (NEURONTIN ) 600 MG TABLET    TAKE 1 TABLET BY MOUTH AT BEDTIME.   GLYCOPYRROLATE-FORMOTEROL (BEVESPI  AEROSPHERE) 9-4.8 MCG/ACT AERO    Inhale 2 puffs into the lungs 2 (two) times daily.   IBUPROFEN (ADVIL) 200 MG TABLET    Take 800 mg by mouth every 6 (six) hours as needed for mild pain (pain score 1-3) or moderate pain (pain score 4-6).   LEVOTHYROXINE  (SYNTHROID ) 88 MCG TABLET    Take 1 tablet (  88 mcg total) by mouth daily at 6 (six) AM.   NYSTATIN CREAM (MYCOSTATIN)    Apply topically.   PROMETHAZINE  (PHENERGAN ) 25 MG TABLET    TAKE 1 TABLET BY MOUTH EVERY 8 HOURS AS NEEDED FOR NAUSEA OR VOMITING.   PROPRANOLOL  (INDERAL ) 80 MG TABLET    TAKE 1 TABLET BY MOUTH EVERY DAY   SODIUM CHLORIDE  HYPERTONIC 3 % NEBULIZER SOLUTION    Take 4 mLs by nebulization 2 (two) times daily.   TRAZODONE  (DESYREL ) 100  MG TABLET    TAKE 1 TABLET BY MOUTH EVERYDAY AT BEDTIME  Modified Medications   No medications on file  Discontinued Medications   No medications on file    Allergies: Allergies  Allergen Reactions   Latex Anaphylaxis and Swelling   Tape Itching and Rash    Please use paper tape only. Will pull off skin if not.   Codeine Nausea And Vomiting    GI Upset (intolerance), Vomiting (intolerance)   Oxycodone  Nausea And Vomiting    Pre-medication with phenergan  needed.    Past Medical History: Past Medical History:  Diagnosis Date   Abdominal pain 02/11/2011   Abnormal CT of the chest 02/04/2014   CT chest 08/2013:  atx in RML and lingula  CT angio 12/2013:  No PE, RML scarring, abnormal density in lingula  (done at [facility])     Allergy     AMS (altered mental status) 09/03/2023   Anxiety    Arthritis    Asthma    Bruises easily    Chronic chest wall pain    Chronic pain    Cough    Depression    Diabetes mellitus without complication (HCC)    Dyspnea    chronic   Dysuria 09/18/2022   Encounter for therapeutic drug monitoring 02/11/2011   Fibromyalgia    Functional movement disorder    GERD (gastroesophageal reflux disease)    Headache    Hearing loss    High cholesterol    History of hiatal hernia    Hypertension    Hypothyroidism    Kidney disease    stage 3   Leg swelling    both   Localized swelling of both lower legs 05/29/2023   Nausea 02/11/2011   Nausea and vomiting 04/01/2013   Pain management    Paranoid behavior (HCC) 07/08/2023   Sebaceous cyst of breast, right subaereolar 10/30/2010   Septic shock (HCC) 01/19/2023   SMAS (superior mesenteric artery syndrome)    Thyroid  disease    TIA (transient ischemic attack)    Tic disorder    TMJ (dislocation of temporomandibular joint)    Transient cerebral ischemia    Type 2 diabetes mellitus with complications (HCC)     Social History: Social History   Socioeconomic History   Marital status:  Married    Spouse name: Not on file   Number of children: 2   Years of education: Not on file   Highest education level: 12th grade  Occupational History   Occupation: disabled  Tobacco Use   Smoking status: Former    Current packs/day: 0.00    Average packs/day: 0.5 packs/day for 15.0 years (7.5 ttl pk-yrs)    Types: Cigarettes    Quit date: 01/19/2023    Years since quitting: 0.9   Smokeless tobacco: Never  Vaping Use   Vaping status: Former  Substance and Sexual Activity   Alcohol use: No   Drug use: No   Sexual activity: Not Currently  Partners: Male    Birth control/protection: None  Other Topics Concern   Not on file  Social History Narrative   Not on file   Social Drivers of Health   Financial Resource Strain: Low Risk  (03/02/2023)   Overall Financial Resource Strain (CARDIA)    Difficulty of Paying Living Expenses: Not hard at all  Food Insecurity: No Food Insecurity (11/04/2023)   Hunger Vital Sign    Worried About Running Out of Food in the Last Year: Never true    Ran Out of Food in the Last Year: Never true  Transportation Needs: No Transportation Needs (11/04/2023)   PRAPARE - Administrator, Civil Service (Medical): No    Lack of Transportation (Non-Medical): No  Physical Activity: Inactive (03/02/2023)   Exercise Vital Sign    Days of Exercise per Week: 0 days    Minutes of Exercise per Session: 20 min  Stress: Stress Concern Present (03/02/2023)   [employer] of Occupational Health - Occupational Stress Questionnaire    Feeling of Stress : Very much  Social Connections: Moderately Isolated (11/04/2023)   Social Connection and Isolation Panel    Frequency of Communication with Friends and Family: Three times a week    Frequency of Social Gatherings with Friends and Family: Once a week    Attends Religious Services: Never    Database Administrator or Organizations: No    Attends Banker Meetings: Never    Marital Status:  Married    CMP     Component Value Date/Time   NA 146 (H) 12/19/2023 1033   K 4.2 12/19/2023 1033   CL 104 12/19/2023 1033   CO2 28 12/19/2023 1033   GLUCOSE 86 12/19/2023 1033   GLUCOSE 99 11/05/2023 0240   BUN 4 (L) 12/19/2023 1033   CREATININE 1.00 12/19/2023 1033   CALCIUM  9.2 12/19/2023 1033   PROT 6.1 12/19/2023 1033   ALBUMIN 4.0 12/19/2023 1033   AST 18 12/19/2023 1033   ALT 11 12/19/2023 1033   ALKPHOS 82 12/19/2023 1033   BILITOT <0.2 12/19/2023 1033   EGFR 68 12/19/2023 1033   GFRNONAA >60 11/05/2023 0240   EKG: 11/02/23 QT/Qtc 348/350 ms  Medication: Voriconazole  200mg  tablet  Drug interactions:  - Aripiprazole  + voriconazole : Patient told me she is not taking aripiprazole . Patient reports she received a call from [facility] last week after I called and they are aware she is not taking aripiprazole . She reports she has been off aripiprazole  for the last ~20 days. She reports she declined another OV with them. Later, [facility] returned my call. ***   - Trazodone  + voriconazole : Trazodone  dose lowered from 100mg  to 25mg  nightly. Counseled patient on signs/symptoms of trazodone  toxicity. - Esomeprazole + voriconazole : Voriconazole  may increase concentration of PPI. PPI may increase concentration of voriconazole . Monitor for response and toxicity to each drug while using concurrent therapy. - Diazepam  + voriconazole : May increase serum concentration of diazepam . *** - Lipitor + voriconazole : May increase serum concentration of Lipitor. ***   ***  Plan: - *** voriconazole  200mg  tablet by mouth every 12 hours. Take on an empty stomach, at least 1 hour before or 1 hour after a meal. - Per discussion with PCP, decrease trazodone  to 25mg  at bedtime and monitor for side effects such as excessive drowsiness or sedation. - Patient is not taking aripiprazole . Medication list updated. - Repeat labs in 10 days.  I discussed the assessment and treatment plan with  the patient. The patient was provided an opportunity to ask questions and all were answered. The patient agreed with the plan and demonstrated an understanding of the instructions.   The patient was advised to call back or seek an in-person evaluation if the symptoms worsen or if the condition fails to improve as anticipated.  I provided 25 minutes of non-face-to-face time during this encounter.  Aleck Puls, PharmD, BCPS, CPP Clinical Pharmacist  [facility]

## 2023-12-22 NOTE — Telephone Encounter (Signed)
 Received call from [facility] - they report they have attempted to reach the patient several times to discuss aripiprazole  and have been unable to reach her.   They report their last contact with her was on 11/24/23 when aripiprazole  Rx was renewed. They are not aware she is not taking aripiprazole .   I advised Ash with [facility] of what patient told me this morning. They have no record of contact with patient last week. I advised that fill history supports that patient is not taking aripiprazole . They will continue to attempt to reach her and asked if I can encourage the patient to call them.   At my next contact with patient, I will attempt to call the patient to have her call [facility].  Called [facility] to confirm fill history of aripiprazole . Last 3 fills of aripiprazole  are as follows -  - 11/13/23 aripiprazole  10mg  for 3 day supply  - 10/22/23 aripiprazole  10mg  for 3 day supply - 09/27/23 aripiprazole  5mg  for 30 day supply   Appears patient is not taking aripiprazole . Will encourage patient to follow-up with psychiatry.

## 2023-12-23 ENCOUNTER — Other Ambulatory Visit (HOSPITAL_BASED_OUTPATIENT_CLINIC_OR_DEPARTMENT_OTHER): Payer: Self-pay | Admitting: Family Medicine

## 2023-12-23 MED ORDER — VORICONAZOLE 200 MG PO TABS: 200.0000 mg | ORAL_TABLET | Freq: Two times a day (BID) | ORAL | 2 refills | Status: DC

## 2023-12-23 NOTE — Progress Notes (Signed)
 ATC patient to alert her that Rx for voriconazole  was sent to [facility]. Left HIPAA compliant VM but requested her to call me back at [phone] as it was after my last contact with patient when I was alerted by [facility] that she had not been in touch with them. Would like to discuss and encourage her to follow-up with [facility]. Given several drug interactions and medication modifications as well as benzodiazepine use, recommend follow-up with [facility].    Discussed case with PCP via phone. Plan for PCP to contact patient regarding diazepam  dose. Per fill history, appears patient is using diazepam  5mg  tablet once daily, sometimes more often. Per history obtained with PCP, sometimes patient takes once daily and sometimes twice daily. Given voriconazole  increases exposure of diazepam  AUC by 2.2-fold in kinetic studies, we discussed likely reasonable to decrease dose by half of what she is currently taking then educate her about early withdrawal symptoms. Can modify plan as needed. Balancing risk of excessive CNS depression and withdrawal. Goal to keep patient stable.

## 2023-12-23 NOTE — Telephone Encounter (Signed)
 Discussed Diazepam  dosing with voriconazole  with [facility], [facility] over the phone on 12/23/2023. PCP and pharmacist agreeable to decrease Diazepam  dosing to from 5mg  daily to  2.5mg  as serum concentration is increased with voriconazole  dosing. Will monitor patient closely for CNS effects and educate on signs and symptoms.

## 2023-12-24 ENCOUNTER — Other Ambulatory Visit (HOSPITAL_BASED_OUTPATIENT_CLINIC_OR_DEPARTMENT_OTHER): Payer: Self-pay | Admitting: Family Medicine

## 2023-12-24 ENCOUNTER — Encounter (HOSPITAL_BASED_OUTPATIENT_CLINIC_OR_DEPARTMENT_OTHER): Payer: Self-pay | Admitting: Family Medicine

## 2023-12-24 DIAGNOSIS — F411 Generalized anxiety disorder: Secondary | ICD-10-CM

## 2023-12-24 MED ORDER — DIAZEPAM 5 MG PO TABS: 2.5000 mg | ORAL_TABLET | Freq: Every day | ORAL | 2 refills | Status: AC | PRN

## 2023-12-24 NOTE — Addendum Note (Signed)
 Addended by: Simeon Vera L on: 12/24/2023 10:04 AM   Modules accepted: Orders

## 2023-12-24 NOTE — Progress Notes (Signed)
 Discussed Valium  dosing change with patient over the phone and she verbalized understanding. She is currently taking diazepam  5mg  once daily and will reduce to 2.5 mg (half tablet) once daily. Verbalized possible CNS effects and will monitor with voriconazole  use and reach out to provider if occurring.

## 2023-12-24 NOTE — Progress Notes (Signed)
 Second attempt to call patient. LVM with my direct line to return call [phone].

## 2023-12-24 NOTE — Progress Notes (Signed)
 Called [facility] where [facility] for [facility]  was sent. They have not yet dispensed Rx.   I let the pharmacist with [facility] know we have had trouble getting in touch with the patient to review instructions for diazepam . I advised the pharmacist of plan per discussion with me and PCP that the dose of diazepam  will be lowered to 2.5mg  once daily if the patient is taking 5mg  once daily. The pharmacist with [facility] is making note of this and will tell patient to call us  back and pass along these instructions.

## 2023-12-24 NOTE — Progress Notes (Signed)
 Called [facility] pharmacy where Rx for [facility]iconazole  was sent. They have not yet dispensed Rx for voriconazole .  I let the pharmacist with [facility] know we have had trouble getting in touch with the patient to review instructions for diazepam . I advised the pharmacist of plan per discussion with me and PCP that the dose of diazepam  will be lowered to 2.5mg  once daily if the patient is taking 5mg  once daily. The pharmacist with [facility] is making note of this and will tell patient to call us  back and pass along these instructions.

## 2023-12-24 NOTE — Telephone Encounter (Signed)
 See 12/22/23 pharmacotherapy visit note and voriconazole  patient message 12/23/23/ for further details - counseling completed.   Drug interactions were addressed and detailed instructions given via MyChart message.   PCP has also attempted to reach patient.   Voriconazole  Rx triaged to [facility] pharmacy. Patient to have labs drawn for trough level in 7-10 days.

## 2023-12-24 NOTE — Progress Notes (Signed)
 Second attempt to reach patient regarding below. LVM with my contact number to return call. [phone]

## 2023-12-29 NOTE — Telephone Encounter (Signed)
 See 12/23/23 thread for details - shows MyChart message was read. I attempted to reach patient again to ensure she understands MyChart message. LVM with my contact number. ATC alternate contact but was unable to leave message.  Lab orders for CMP  and voriconazole  level were entered for day of Ast[facility]/Allergy  [place] appointment on 01/01/24. I previously discussed instructions with patient for how to obtain a trough. I have been unable to speak with patient over the phone since 12/22/23 to confirm understanding.  Will attempt to reach patient with lab results on 01/01/24.

## 2023-12-30 NOTE — Telephone Encounter (Signed)
 Patient returned my calls on 12/29/23 to report she does not tolerate voriconazole . She feels in a trance, and notes blurry vision which she states messes with my mind. Reports dry heaves, sick on stomach. She took a total of 5 doses and her last dose was Saturday (11/29).   Discussed with Dr. Kara via secure chat. Plan to switch voriconazole  to itraconazole.   ATC patient to discuss new plan and review drug interactions, administration instructions, and dosing. LVM at [phone] requesting call back. MyChart message sent.

## 2023-12-30 NOTE — Progress Notes (Signed)
 Patient and PCP spoke about diazepam  dosing instructions and patient started voriconazole .    Patient called me on 12/29/23 to report she does not tolerate voriconazole . She feels in a trance, and notes blurry vision which she states messes with my mind. Reports dry heaves, sick on stomach. She took a total of 5 doses and her last dose was Saturday (11/29).    Discussed with Dr. Kara via secure chat. Plan to switch voriconazole  to itraconazole.    ATC patient to discuss new plan and review drug interactions, administration instructions, and dosing. LVM at [phone] requesting call back. MyChart message sent in previous thread - see 12-17-23 telephone thread.

## 2023-12-31 ENCOUNTER — Ambulatory Visit: Attending: Pulmonary Disease

## 2023-12-31 DIAGNOSIS — Z5181 Encounter for therapeutic drug level monitoring: Secondary | ICD-10-CM

## 2023-12-31 DIAGNOSIS — J189 Pneumonia, unspecified organism: Secondary | ICD-10-CM

## 2023-12-31 MED ORDER — ITRACONAZOLE 100 MG PO CAPS: 200.0000 mg | ORAL_CAPSULE | Freq: Two times a day (BID) | ORAL | 1 refills | Status: DC

## 2023-12-31 NOTE — Progress Notes (Signed)
 [facility]  Referring Provider: Dr. Kara  Virtual Visit via Telephone Note  I connected with Keon Igoe on 12/31/23 at 11:00 AM EST by telephone and verified that I am speaking with the correct person using two identifiers.  Location: Patient: home Provider: office   I discussed the limitations, risks, security and privacy concerns of performing an evaluation and management service by telephone and the availability of in person appointments. I also discussed with the patient that there may be a patient responsible charge related to this service. The patient expressed understanding and agreed to proceed.  HPI: Sabrina Roberson is a 53 y.o. female who presents to the pharmacotherapy clinic via telephone for itraconazole new start counseling.   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 She was started on voriconazole , but self-stopped due to side effects. Plan discussed with Dr. Kara via secure chat - switch to itraconazole.  Patient denies PMH cardiovascular dysfunction or heart failure.  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   Chronic cough 09/02/2023   Dysdiadochokinesia 09/02/2023   Pneumonia 09/02/2023   Severe episode of recurrent major depressive disorder, with psychotic features (HCC) 07/08/2023   Lymphadenopathy, axillary 07/08/2023   Recurrent pneumonia 06/27/2023   Bronchiectasis without complication (HCC) 06/10/2023   Pneumonia due to methicillin resistant Staphylococcus aureus (MRSA) (HCC) 04/03/2023   Physical deconditioning 02/17/2023   Bilateral lower extremity edema 12/03/2022   Bilateral carotid bruits 11/04/2022   CKD (chronic kidney disease) stage 2, GFR 60-89 ml/min 11/04/2022   Mixed hyperlipidemia 10/10/2022   GAD (generalized anxiety  disorder) 10/10/2022   Acquired hypothyroidism 10/10/2022   Hypertension    Type 2 diabetes mellitus with diabetic chronic kidney disease (HCC)    Primary insomnia 09/18/2022   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  Previous Medications   ALBUTEROL  (ACCUNEB ) 1.25 MG/3ML NEBULIZER SOLUTION    Take 1 ampule by nebulization every 6 (six) hours as needed for wheezing.   ALBUTEROL  (VENTOLIN  HFA) 108 (90 BASE) MCG/ACT INHALER    TAKE 2 PUFFS BY MOUTH EVERY 6 HOURS AS NEEDED FOR WHEEZE OR SHORTNESS OF BREATH   ATORVASTATIN  (LIPITOR) 20 MG TABLET    TAKE 1 TABLET BY MOUTH EVERYDAY AT BEDTIME   DIAZEPAM  (VALIUM ) 5 MG TABLET    Take 0.5 tablets (2.5 mg total) by mouth daily as needed for anxiety. Do not take more than 2.5 mg (half tablet) per day.   FUROSEMIDE  (LASIX ) 20 MG TABLET    Take 1 tablet (20 mg total) by mouth daily for 5 days.   GABAPENTIN  (NEURONTIN ) 600 MG TABLET    TAKE 1 TABLET BY MOUTH AT BEDTIME.   GLYCOPYRROLATE-FORMOTEROL (BEVESPI  AEROSPHERE) 9-4.8 MCG/ACT AERO    Inhale 2 puffs into the lungs 2 (two) times daily.   IBUPROFEN (ADVIL) 200 MG TABLET    Take 800 mg by mouth every 6 (six) hours as needed for mild pain (  pain score 1-3) or moderate pain (pain score 4-6).   LEVOTHYROXINE  (SYNTHROID ) 88 MCG TABLET    Take 1 tablet (88 mcg total) by mouth daily at 6 (six) AM.   NYSTATIN CREAM (MYCOSTATIN)    Apply topically.   PROMETHAZINE  (PHENERGAN ) 25 MG TABLET    TAKE 1 TABLET BY MOUTH EVERY 8 HOURS AS NEEDED FOR NAUSEA OR VOMITING.   PROPRANOLOL  (INDERAL ) 80  MG TABLET    TAKE 1 TABLET BY MOUTH EVERY DAY   SODIUM CHLORIDE  HYPERTONIC 3 % NEBULIZER SOLUTION    Take 4 mLs by nebulization 2 (two) times daily.   TRAZODONE  (DESYREL ) 50 MG TABLET    Take 0.5 tablets (25 mg total) by mouth at bedtime as needed for sleep.  Modified Medications   No medications on file  Discontinued Medications   ESOMEPRAZOLE (NEXIUM) 20 MG CAPSULE    Take 20 mg by mouth daily at 12 noon.   VORICONAZOLE  (VFEND ) 200 MG TABLET    Take 1 tablet (200 mg total) by mouth 2 (two) times daily.    Allergies: Allergies  Allergen Reactions   Latex Anaphylaxis and Swelling   Tape Itching and Rash    Please use paper tape only. Will pull off skin if not.   Codeine Nausea And Vomiting    GI Upset (intolerance), Vomiting (intolerance)   Oxycodone  Nausea And Vomiting    Pre-medication with phenergan  needed.   CMP     Component Value Date/Time   NA 146 (H) 12/19/2023 1033   K 4.2 12/19/2023 1033   CL 104 12/19/2023 1033   CO2 28 12/19/2023 1033   GLUCOSE 86 12/19/2023 1033   GLUCOSE 99 11/05/2023 0240   BUN 4 (L) 12/19/2023 1033   CREATININE 1.00 12/19/2023 1033   CALCIUM  9.2 12/19/2023 1033   PROT 6.1 12/19/2023 1033   ALBUMIN 4.0 12/19/2023 1033   AST 18 12/19/2023 1033   ALT 11 12/19/2023 1033   ALKPHOS 82 12/19/2023 1033   BILITOT <0.2 12/19/2023 1033   EGFR 68 12/19/2023 1033   GFRNONAA >60 11/05/2023 0240   EKG: 11/02/23 QT/Qtc 348/350 ms   Medication: Itraconazole (Sporanox) 100mg  capsule   Dosing: 2 capsules (200mg ) twice daily  Duration: at least 12 weeks per IDSA 2016 guidelines, recommend follow-up with Dr. Kara at scheduled time to re-evaluate or determine if longer duration is needed  Adverse reactions, significant (considerations)   - Cardiovascular effects - Hepatotoxicity - Hypokalemia - Peripheral neuropathy  Side effects are usually reversible with therapy discontinuation.  Adverse reactions:  - Nausea (3-11%) - Dermatologic:  Diaphoresis (4%), pruritus (3% to 5%), skin rash (3% to 9%) - Gastrointestinal: constipation (2% to 3%), diarrhea (3% to 10%)  Drug interactions: Strong CYP3A4, P-gp inhibitor. Acid suppressing therapies reduce absorption of capsule. - Diazepam : Plan discussed with PCP to reduce dose to 2.5mg  once daily from 5mg  once daily while taking CYP3A4 inhibitor. Educated patient on s/sx CNS depression as well as withdrawal. Encouraged to notify office if she experiences side effects.  - Atorvastatin : patient reports she is not currently taking, has been off x 4 months. Remain off atorvastatin  while taking strong CYP3A4 inhibitor - Nexium: Trial stopping Nexium while taking itraconazole as itraconazole requires an acidic environment for absorption. If rebound acid reflux occurs, can restart Nexium but ensure to take itraconazole with an acidic drink such as a non-diet soda. - Trazodone : Plan discussed with PCP to reduce dose to 25mg  nightly while taking CYP3A4 inhibitor. Monitor for s/sx CNS depression.  Administration:  - Capsule: Absorption is best if taken with food; therefore, it is best to administer after a full meal. Capsules should be swallowed whole.  Plan: - Start itraconazole 100mg  capsule: Take 2 capsules (200mg  total) by mouth twice daily. Notify PharmD via MyChart message when you start taking itraconazole. - Take itraconazole with food or a full meal. - Repeat labs (serum trough level, CMP) 15 days after starting itraconazole - may obtain at [facility] location.  - Take diazepam  as directed by PCP at lower dose 2.5mg  once daily while on therapy with CYP3A4 inhibitor - Remain off atorvastatin  while taking itraconazole.  - Stop taking Nexium while taking itraconazole. If rebound acid reflux symptoms occur, you may restart Nexium but ensure you take itraconazole with an acidic drink, such as a not-diet cola. - Take trazodone  25mg  (one-half tablet) once nightly while taking itraconazole.  -  Monitor for side effects of itraconazole and symptoms of excessive CNS sedation while we have adjusted doses of other medications that interact with itraconazole. Report these symptoms to our office and/or your PCP.  - Follow-up with Dr. Kara as scheduled on 02/18/2024. Re-assess planned duration of therapy at that time.  I discussed the assessment and treatment plan with the patient. The patient was provided an opportunity to ask questions and all were answered. The patient agreed with the plan and demonstrated an understanding of the instructions.   The patient was advised to call back or seek an in-person evaluation if the symptoms worsen or if the condition fails to improve as anticipated.  I provided 22 minutes of non-face-to-face time during this encounter.  Aleck Puls, PharmD, BCPS, CPP Clinical Pharmacist  [facility]  [facility]

## 2023-12-31 NOTE — Telephone Encounter (Signed)
 error

## 2023-12-31 NOTE — Progress Notes (Signed)
 Patient returned my call - see itraconazole counseling in 12/31/23 OV note.

## 2024-01-01 ENCOUNTER — Ambulatory Visit: Admitting: Allergy & Immunology

## 2024-01-02 NOTE — Progress Notes (Unsigned)
 Per CMM, PA approved. Called [facility] to see if they have in stock. Per [facility] staff, they have some in stock but when claim is adjudicated, it is showing it still needs PA.   Will await PA approval letter. Will try calling [facility] again later to see if claim goes through.

## 2024-01-02 NOTE — Progress Notes (Unsigned)
 Contacted [facility] because it does not appear itraconazole  was dispensed. Pharmacist at [facility] informed me a PA is needed. They attempted to alert our office but the fax did not go through.   Submitted a Prior Authorization request for ITRACONAZOLE  via CoverMyMeds. Will update once we receive a response.  Heba Oyola (Key: [id number])  Will update when we receive response.

## 2024-01-02 NOTE — Progress Notes (Addendum)
 Called [facility]. Per [facility] staff, itraconazole  showing PA is still needed despite PA approval through [facility] on my end.   [facility] staff reports family member picked up medication despite paying OOP.  Will investigate further next week to prevent future fills paid OOP as PA was approved by insurance.

## 2024-01-08 NOTE — Progress Notes (Unsigned)
 Spoke with patient regarding itraconazole  side effects -   Reports GI s/e including nausea, dry heaves, one occasion of vomiting, and abdominal pain.   She took itraconazole  on an empty stomach in the morning and with a salad or light meal in the evening. Itraconazole  must be taken with food for absorption. May also help mitigate side effects.   Reviewed option to trial alternative, posaconazole DR tab, or re-trial itraconazole  with appropriate administration instructions.   Patient prefers to re-trial itraconazole  at prescribed dosing but with optimized administration (see below notes).  Notes:  - Administer with full meal including protein and healthy fat - Documented dysphagia - she reports she is able to eat salad, burgers, soup. Discussed foods that are softer and may be easier to swallow, such as soup, yogurt, etc.  - May take promethazine  tablet PRN (try taking a dose of promethazine , then eating full meal, then swallowing itraconazole  right after finishing full meal to ensure it is taken with food).  - She confirms appropriate dosing of interacting drugs (diazepam , trazodone ) as previously discussed. Not taking atorvastatin .  PA for itraconazole  was approved on 01/02/24. Despite this, patient paid cash at pharmacy because pharmacy system still showed drug needed prior authorization. Possible patient can get refund but this would need to be discussed with pharmacy.  [facility] - when itraconazole  is run on insurance, copay is more expensive than price when it was run on insurance card.   MyChart message sent to patient.

## 2024-01-09 ENCOUNTER — Ambulatory Visit: Admitting: Pulmonary Disease

## 2024-01-19 ENCOUNTER — Ambulatory Visit: Payer: Self-pay

## 2024-01-19 ENCOUNTER — Telehealth: Payer: Self-pay

## 2024-01-19 DIAGNOSIS — B4489 Other forms of aspergillosis: Secondary | ICD-10-CM

## 2024-01-19 DIAGNOSIS — T887XXA Unspecified adverse effect of drug or medicament, initial encounter: Secondary | ICD-10-CM

## 2024-01-19 NOTE — Addendum Note (Signed)
 Addended by: Avionna Bower L on: 01/19/2024 03:26 PM   Modules accepted: Orders

## 2024-01-19 NOTE — Telephone Encounter (Signed)
 FYI Only or Action Required?: FYI only for provider: appointment scheduled on 01/23/24.  Patient was last seen in primary care on 10/16/2023 by Knute Thersia Bitters, FNP.  Called Nurse Triage reporting Rash.  Symptoms began several weeks ago.  Interventions attempted: OTC medications: Benadryl .  Symptoms are: unchanged.  Triage Disposition: See Within 3 Days in Office (overriding See HCP Within 4 Hours (Or PCP Triage))  Patient/caregiver understands and will follow disposition?: Yes              Copied from CRM [id number]. Topic: Clinical - Red Word Triage >> Jan 19, 2024  2:44 PM Sabrina Roberson wrote: Red Word that prompted transfer to Nurse Triage: Pt was recently seen by lung dr. Dr advised pt to be seen for a rash with PCP Rash around lips/mouth and underarm,has a few from nose to hips. It swells up, looks like blisters, it will pop, try to heal and show up again. Transferred to NT Reason for Disposition  Large or small blisters on skin (i.e., fluid filled bubbles or sacs)  Answer Assessment - Initial Assessment Questions 1. APPEARANCE of RASH: What does the rash look like? (e.g., spots, blisters, raised areas, skin peeling, scaly)     Currently about 7 spots. They start as red spots and then blister with white head center and pop. She states not all are blistered at this time.  2. SIZE: How big are the spots? (e.g., tip of pen, eraser, coin; inches, centimeters)     About the size of a coffee straw opening. Slightly larger than a pen point/tip.  3. LOCATION: Where is the rash located?     Around mouth, under arm pits, around breasts.  4. COLOR: What color is the rash? (Note: It is difficult to assess rash color in people with darker-colored skin. When this situation occurs, simply ask the caller to describe what they see.)     Red.  5. ONSET: When did the rash begin?     Couple weeks.  6. FEVER: Do you have a fever? If Yes, ask: What is your temperature, how  was it measured, and when did it start?     No. She states she has shivers and chills sometimes.  7. ITCHING: Does the rash itch? If Yes, ask: How bad is the itch? (Scale 1-10; or mild, moderate, severe)     No.  8. CAUSE: What do you think is causing the rash?     Patient thinks this is a reaction from the medications. Her pulmonary team advised she see her PCP as well to rule out other causes. She is also referred to ID. 9. NEW MEDICINES: What new medicines are you taking? (e.g., name of antibiotic) When did you start taking this medication?.     Voriconazole  and Itraconazole . Started both within the last month.  10. OTHER SYMPTOMS: Do you have any other symptoms? (e.g., sore throat, fever, joint pain)       No mouth/tongue/lip swelling. She states she has some numbness around her mouth x couple of weeks. States no new difficulty breathing, no pain or itching, no difficulty swallowing.  Patient requesting to be seen at [facility]. RN gave strict ED precautions and patient verbalizes. Patient asking if symptoms are contagious; advised to treat it as if contagious until diagnosed.  Protocols used: Rash - Widespread On Drugs-A-AH

## 2024-01-19 NOTE — Telephone Encounter (Signed)
Office visit has been scheduled.

## 2024-01-19 NOTE — Telephone Encounter (Signed)
 Patient returned my call - at last contact via messaging, she reported having GI side effects and rash after starting itraconazole . I left HIPAA compliant VM that I would discuss plan with Dr. Kara and get back to her.  Today, she reports the rash has spread - endorses small red spots that turn into small blisters that pop on their own. She says there are a few spots in various places - arms, face, chest, back. Compares the size of the spots to the size of the end of a coffee straw. She denies fever, pain or itching. Denies any other new onset symptoms or other signs of allergic reaction.   She has tried treating the rash with triamcinolone cream, Nystatin cream, and mupirocin  cream. No improvement with these treatments. Advised to stop use of Nystatin and mupirocin  cream as these likely are not helping.  Discussed with Dr. Kara in secure chat.  - Referral to ID for treatment of aspergillus since patient has trialed/failed two -azole therapies due to intolerance.  - Referral to dermatology for evaluation of rash - Recommend to see PCP in the meantime for evaluation of rash - rash has continued despite cessation of therapy with itraconazole .  - In the meantime, can try antihistamines such as Zyrtec or Claritin according to package instructions. Cautioned about Benadryl  given side effect profile and she is taking several CNS depressive agents.

## 2024-01-23 ENCOUNTER — Ambulatory Visit (INDEPENDENT_AMBULATORY_CARE_PROVIDER_SITE_OTHER): Admitting: Family Medicine

## 2024-01-23 VITALS — BP 102/66 | HR 60 | Ht 64.0 in | Wt 139.7 lb

## 2024-01-23 DIAGNOSIS — L270 Generalized skin eruption due to drugs and medicaments taken internally: Secondary | ICD-10-CM | POA: Insufficient documentation

## 2024-01-23 NOTE — Assessment & Plan Note (Signed)
 Likely due to antifungal medications, presenting as blisterous rash with dark discoloration. No systemic involvement. Expected to resolve post-medication discontinuation. - Monitor rash for resolution over the next week. - Consider topical steroid cream if itching develops. - Use moisturizer for dry skin. - Can proceed with scheduling dermatology appointment so that if rash persist beyond expected duration, can proceed with further evaluation.

## 2024-01-23 NOTE — Progress Notes (Signed)
" ° ° °  Procedures performed today:    None.  Independent interpretation of notes and tests performed by another provider:   None.  Brief History, Exam, Impression, and Recommendations:    BP 102/66 (BP Location: Right Arm, Patient Position: Sitting, Cuff Size: Normal)   Pulse 60   Ht 5' 4 (1.626 m)   Wt 139 lb 11.2 oz (63.4 kg)   SpO2 95%   BMI 23.98 kg/m   Discussed the use of AI scribe software for clinical note transcription with the patient, who gave verbal consent to proceed.  History of Present Illness Minal Stuller is a 53 year old female who presents with a rash and ongoing lung issues.  She has been experiencing persistent lung trouble and difficulty with mucus clearance. A previous culture identified a fungal infection, possibly Aspergillus, for which she was prescribed two antifungal medications. Both medications caused significant gastrointestinal distress, including severe stomach pain radiating to her back, leading her to discontinue them approximately two weeks ago.  Following the use of antifungal medications, she developed a rash characterized by blister-like lesions that burst and left dark, purplish marks. The rash appeared on her arms and mouth, but there is no associated pain or itching. Despite stopping the medications, the rash has not improved.  She was hospitalized on January 19, 2024, for pneumonia and has not fully recovered since. She underwent bronchoscopies, and was told there may have been an issue with the upper right portion of her lung and possible infection. She is scheduled to see an infectious disease specialist on February 04, 2024.  No swelling in the mouth or breathing difficulties reported.  On exam, patient is in no acute distress, vital signs stable.  Cardiovascular exam with regular rate and rhythm, lungs clear to auscultation bilaterally.  Patient has about 3 lesions periorally which are slightly raised, mildly erythematous.  No current  fluctuance, oozing, drainage.  She also has lesion in right axilla which is slightly more elevated than size, some skin peeling over top.  No oozing or drainage present.  Drug rash Assessment & Plan: Likely due to antifungal medications, presenting as blisterous rash with dark discoloration. No systemic involvement. Expected to resolve post-medication discontinuation. - Monitor rash for resolution over the next week. - Consider topical steroid cream if itching develops. - Use moisturizer for dry skin. - Can proceed with scheduling dermatology appointment so that if rash persist beyond expected duration, can proceed with further evaluation.   Return if symptoms worsen or fail to improve.   ___________________________________________ Kartik [place], MD, ABFM, [facility] "

## 2024-01-30 ENCOUNTER — Telehealth: Payer: Self-pay

## 2024-01-30 ENCOUNTER — Encounter (HOSPITAL_BASED_OUTPATIENT_CLINIC_OR_DEPARTMENT_OTHER): Payer: Self-pay | Admitting: Family Medicine

## 2024-01-30 ENCOUNTER — Ambulatory Visit: Payer: Self-pay | Admitting: Pulmonary Disease

## 2024-01-30 DIAGNOSIS — J471 Bronchiectasis with (acute) exacerbation: Secondary | ICD-10-CM

## 2024-01-30 MED ORDER — AMOXICILLIN-POT CLAVULANATE 875-125 MG PO TABS: 1.0000 | ORAL_TABLET | Freq: Two times a day (BID) | ORAL | 0 refills | Status: DC

## 2024-01-30 NOTE — Telephone Encounter (Signed)
 ATC x1. LMTCB.  No avail appts.

## 2024-01-30 NOTE — Telephone Encounter (Signed)
 FYI Only or Action Required?: Action required by provider: request for appointment.  Patient is followed in Pulmonology for recurrent pneumonia, last seen on 11/20/2023 by Kara Dorn NOVAK, MD.  Called Nurse Triage reporting Cough.  Symptoms began yesterday.  Interventions attempted: OTC medications: ibuprofen and Rescue inhaler.  Symptoms are: gradually worsening.  Triage Disposition: Go to ED Now (Notify PCP)  Patient/caregiver understands and will follow disposition?: No, refuses disposition  E2C2 Pulmonary Triage - Initial Assessment Questions Chief Complaint (e.g., cough, sob, wheezing, fever, chills, sweat or additional symptoms) *Go to specific symptom protocol after initial questions. Cough, SOB  How long have symptoms been present? Noticed changes yesterday- increased SOB  Have you tested for COVID or Flu? Note: If not, ask patient if a home test can be taken. If so, instruct patient to call back for positive results. No  MEDICINES:   Have you used any OTC meds to help with symptoms? Yes If yes, ask What medications? ibuprofen  Have you used your inhalers/maintenance medication? Yes If yes, What medications? Albuterol  Unable to use nebulizer  If inhaler, ask How many puffs and how often? Note: Review instructions on medication in the chart. 2puffs/every 4 hours  OXYGEN: Do you wear supplemental oxygen? No If yes, How many liters are you supposed to use? na  Do you monitor your oxygen levels? Yes If yes, What is your reading (oxygen level) today? 89%  What is your usual oxygen saturation reading?  (Note: Pulmonary O2 sats should be 90% or greater) Yesterday- 89-88%, normal in 90   Copied from CRM #[id number]. Topic: Clinical - Red Word Triage >> Jan 30, 2024  8:45 AM Sabrina Roberson wrote: Red Word that prompted transfer to Nurse Triage:  stuffy nose, cold and trouble breathing and coughing up green thick mucus. Oxygen stats between 83-91 and  dropping and staying in that range. Reason for Disposition  Oxygen level (e.g., pulse oximetry) 90% or lower  Answer Assessment - Initial Assessment Questions 1. RESPIRATORY STATUS: Describe your breathing? (e.g., wheezing, shortness of breath, unable to speak, severe coughing)      Feels like panic with chest pain, sitting down- she is ok- getting up -feels like closing.  2. ONSET: When did this breathing problem begin?      yesterday 3. PATTERN Does the difficult breathing come and go, or has it been constant since it started?      Comes and goes 4. SEVERITY: How bad is your breathing? (e.g., mild, moderate, severe)      moderate 5. RECURRENT SYMPTOM: Have you had difficulty breathing before? If Yes, ask: When was the last time? and What happened that time?      Yes- patient has hx pneumonia  6. CARDIAC HISTORY: Do you have any history of heart disease? (e.g., heart attack, angina, bypass surgery, angioplasty)      no 7. LUNG HISTORY: Do you have any history of lung disease?  (e.g., pulmonary embolus, asthma, emphysema)     Hx respiratory failure 8. CAUSE: What do you think is causing the breathing problem?      Cough, congestion 9. OTHER SYMPTOMS: Do you have any other symptoms? (e.g., chest pain, cough, dizziness, fever, runny nose)     Cough, runny nose 10. O2 SATURATION MONITOR:  Do you use an oxygen saturation monitor (pulse oximeter) at home? If Yes, ask: What is your reading (oxygen level) today? What is your usual oxygen saturation reading? (e.g., 95%)       89%  Protocols  used: Breathing Difficulty-A-AH

## 2024-01-30 NOTE — Telephone Encounter (Signed)
 Spoke w/ pt Sabrina Roberson, no acute visit  available - advise pt to go to UC / Ed if worsens.

## 2024-01-30 NOTE — Telephone Encounter (Signed)
 Patient should be seen in clinic for an acute visit.  Sending in augmentin  in the meantime.  Thanks, JD

## 2024-01-30 NOTE — Telephone Encounter (Signed)
" °  Spoke with patient earlier Rx was sent to pharmacy by provider as we advise to go to ED   Copied from CRM #[id number]. Topic: Clinical - Medical Advice >> Jan 30, 2024 10:42 AM Sabrina Roberson wrote: Reason for CRM: Patient returning phone call from [place] -  states her oxygen is dropping depending on what she's doing when she's moving around - to 85. If sitting down it goes from 85-91 and its alternating. This morning it was 83,84,85 gradually going up to 91. Patient states she has no oxygen now.  Attempt to call [facility], No answer.   Callback number: [phone] "

## 2024-02-04 ENCOUNTER — Other Ambulatory Visit: Payer: Self-pay

## 2024-02-04 ENCOUNTER — Ambulatory Visit: Payer: Self-pay | Admitting: Internal Medicine

## 2024-02-04 ENCOUNTER — Encounter: Payer: Self-pay | Admitting: Internal Medicine

## 2024-02-04 VITALS — BP 127/77 | HR 62 | Temp 97.7°F | Ht 64.0 in | Wt 134.0 lb

## 2024-02-04 DIAGNOSIS — J189 Pneumonia, unspecified organism: Secondary | ICD-10-CM | POA: Diagnosis not present

## 2024-02-04 DIAGNOSIS — R001 Bradycardia, unspecified: Secondary | ICD-10-CM

## 2024-02-04 DIAGNOSIS — Z87891 Personal history of nicotine dependence: Secondary | ICD-10-CM

## 2024-02-04 NOTE — Patient Instructions (Addendum)
 Will get CT chest If ct chest concerning for  aspergilloma/cavitation will plan to start posaconazole F/u with Pulmonology F/U with ID in one month(need ct atleast on week prior to appt) Referred to cardiology

## 2024-02-04 NOTE — Progress Notes (Unsigned)
 "     Patient: Sabrina Roberson  DOB: 12-07-1970 MRN: [id number] PCP: Knute Thersia Bitters, FNP    Chief Complaint  Patient presents with   New Patient (Initial Visit)     Patient Active Problem List   Diagnosis Date Noted   Drug rash 01/23/2024   Acute hypercapnic respiratory failure (HCC) 11/04/2023   Right middle lobe syndrome 10/26/2023   Chronic cough 09/02/2023   Dysdiadochokinesia 09/02/2023   Pneumonia 09/02/2023   Severe episode of recurrent major depressive disorder, with psychotic features (HCC) 07/08/2023   Lymphadenopathy, axillary 07/08/2023   Recurrent pneumonia 06/27/2023   Bronchiectasis without complication (HCC) 06/10/2023   Pneumonia due to methicillin resistant Staphylococcus aureus (MRSA) (HCC) 04/03/2023   Physical deconditioning 02/17/2023   Bilateral lower extremity edema 12/03/2022   Bilateral carotid bruits 11/04/2022   CKD (chronic kidney disease) stage 2, GFR 60-89 ml/min 11/04/2022   Mixed hyperlipidemia 10/10/2022   GAD (generalized anxiety disorder) 10/10/2022   Acquired hypothyroidism 10/10/2022   Hypertension    Type 2 diabetes mellitus with diabetic chronic kidney disease (HCC)    Primary insomnia 09/18/2022   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 female with past medical history of diabetes, GERD, depression/anxiety, hypertension, COPD, asthma followed by pulmonary clinic with history of recurrent pneumonias.  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 - Patient is followed by pulmonology and she underwent CT on 10/5 which showed collapse and volume loss at [place] via right upper lobe and medial  right lower lobe.  Left lower lobe airspace disease that was new on prior has resolved.  Wall thickening in right bronchus intermedius. - Bronchoscopy on 11/04/2023 showed significant purulent secretion noted in right and left mainstem bronchus, extensive airway collapse.  Normal-appearing mucosa right upper lobe.  BAL sent for culture, cytology and cell count.  Path from right upper lung lavage showed no malignant cells, pulmonary macrophages and acute inflammatory cells with scattered benign bronchial cells. -Fungal cultures growing Aspergillus from bronchoscopy.  Achromobacter 70K colonies as well..  Patient did not tolerate voriconazole  or itraconazole .  Referred to infectious disease for further management. - Prior sputum cultures have also grown MRSA, strep medical illness. todayL Pt staaes she had anusea, vomiting, abdominal/cramping which was sever(worse wutuh itraconzoale). Pt was on vori for about a couple weeks, pt on itraconzole  ROS  Past Medical History:  Diagnosis Date   Abdominal pain 02/11/2011   Abnormal CT of the chest 02/04/2014   CT chest 08/2013:  atx in RML and lingula  CT angio 12/2013:  No PE, RML scarring, abnormal density in lingula  (done at [facility])     Allergy    AMS (altered mental status) 09/03/2023   Anxiety    Arthritis    Asthma    Bruises easily    Chronic chest wall pain    Chronic pain    Cough    Depression    Diabetes mellitus without complication (HCC)    Dyspnea    chronic   Dysuria 09/18/2022   Encounter for therapeutic drug monitoring 02/11/2011   Fibromyalgia    Functional movement disorder    GERD (gastroesophageal reflux disease)  Headache    Hearing loss    High cholesterol    History of hiatal hernia    Hypertension    Hypothyroidism    Kidney disease    stage 3   Leg swelling    both   Localized swelling of both lower legs 05/29/2023   Nausea 02/11/2011   Nausea and vomiting 04/01/2013   Pain management    Paranoid  behavior (HCC) 07/08/2023   Sebaceous cyst of breast, right subaereolar 10/30/2010   Septic shock (HCC) 01/19/2023   SMAS (superior mesenteric artery syndrome)    Thyroid  disease    TIA (transient ischemic attack)    Tic disorder    TMJ (dislocation of temporomandibular joint)    Transient cerebral ischemia    Type 2 diabetes mellitus with complications (HCC)     Outpatient Medications Prior to Visit  Medication Sig Dispense Refill   albuterol  (ACCUNEB ) 1.25 MG/3ML nebulizer solution Take 1 ampule by nebulization every 6 (six) hours as needed for wheezing.     albuterol  (VENTOLIN  HFA) 108 (90 Base) MCG/ACT inhaler TAKE 2 PUFFS BY MOUTH EVERY 6 HOURS AS NEEDED FOR WHEEZE OR SHORTNESS OF BREATH 8.5 each 2   amoxicillin -clavulanate (AUGMENTIN ) 875-125 MG tablet Take 1 tablet by mouth 2 (two) times daily. 28 tablet 0   atorvastatin  (LIPITOR) 20 MG tablet TAKE 1 TABLET BY MOUTH EVERYDAY AT BEDTIME 90 tablet 3   diazepam  (VALIUM ) 5 MG tablet Take 0.5 tablets (2.5 mg total) by mouth daily as needed for anxiety. Do not take more than 2.5 mg (half tablet) per day. 15 tablet 2   furosemide  (LASIX ) 20 MG tablet Take 1 tablet (20 mg total) by mouth daily for 5 days. 30 tablet 3   gabapentin  (NEURONTIN ) 600 MG tablet TAKE 1 TABLET BY MOUTH AT BEDTIME. 60 tablet 3   Glycopyrrolate-Formoterol (BEVESPI  AEROSPHERE) 9-4.8 MCG/ACT AERO Inhale 2 puffs into the lungs 2 (two) times daily. 10.7 g 5   ibuprofen (ADVIL) 200 MG tablet Take 800 mg by mouth every 6 (six) hours as needed for mild pain (pain score 1-3) or moderate pain (pain score 4-6).     levothyroxine  (SYNTHROID ) 88 MCG tablet Take 1 tablet (88 mcg total) by mouth daily at 6 (six) AM. 90 tablet 2   nystatin cream (MYCOSTATIN) Apply topically.     promethazine  (PHENERGAN ) 25 MG tablet TAKE 1 TABLET BY MOUTH EVERY 8 HOURS AS NEEDED FOR NAUSEA OR VOMITING. 30 tablet 3   propranolol  (INDERAL ) 80 MG tablet TAKE 1 TABLET BY MOUTH EVERY DAY 90 tablet 3    sodium chloride  HYPERTONIC 3 % nebulizer solution Take 4 mLs by nebulization 2 (two) times daily.     traZODone  (DESYREL ) 50 MG tablet Take 0.5 tablets (25 mg total) by mouth at bedtime as needed for sleep. 30 tablet 1   No facility-administered medications prior to visit.     Allergies[1]  Social History[2]  Family History  Problem Relation Age of Onset   Asthma Father    Breast cancer Sister    Hypertension Sister    Parkinson's disease Maternal Aunt    Pulmonary fibrosis Maternal Grandmother    Emphysema Maternal Grandmother    Heart disease Maternal Grandmother    Breast cancer Maternal Grandmother    Asthma Maternal Grandfather    Heart disease Maternal Grandfather    Cancer Paternal Grandmother        breast   Heart disease Paternal Grandmother    Breast cancer Paternal Grandmother  Asthma Paternal Grandmother    Leukemia Paternal Grandfather    Heart disease Paternal Grandfather    Clotting disorder Paternal Grandfather     Objective:   Vitals:   02/04/24 1258  BP: 127/77  Pulse: 62  Temp: 97.7 F (36.5 C)  TempSrc: Temporal  SpO2: 94%  Weight: 134 lb (60.8 kg)  Height: 5' 4 (1.626 m)   Body mass index is 23 kg/m.  Physical Exam  Lab Results: Lab Results  Component Value Date   WBC 3.2 (L) 11/05/2023   HGB 10.4 (L) 11/05/2023   HCT 34.3 (L) 11/05/2023   MCV 95.0 11/05/2023   PLT 207 11/05/2023    Lab Results  Component Value Date   CREATININE 1.00 12/19/2023   BUN 4 (L) 12/19/2023   NA 146 (H) 12/19/2023   K 4.2 12/19/2023   CL 104 12/19/2023   CO2 28 12/19/2023    Lab Results  Component Value Date   ALT 11 12/19/2023   AST 18 12/19/2023   ALKPHOS 82 12/19/2023   BILITOT <0.2 12/19/2023     Assessment & Plan:   Problem List Items Addressed This Visit   None #fomer smoker-30 year packe hisotyt(quit one year ago)  *** will return to clinic in *** {weeks/months} for follow up #Abnoramal HR -228 initially, EKG showed sinus  bradycardia 57 -no symtoms todya -referred to ovards Loney Stank, MD [facility]   02/04/2024  1:10 PM     [1]  Allergies Allergen Reactions   Latex Anaphylaxis and Swelling   Tape Itching and Rash    Please use paper tape only. Will pull off skin if not.   Itraconazole  Nausea And Vomiting and Rash   Codeine Nausea And Vomiting    GI Upset (intolerance), Vomiting (intolerance)   Oxycodone  Nausea And Vomiting    Pre-medication with phenergan  needed.  [2]  Social History Tobacco Use   Smoking status: Former    Current packs/day: 0.00    Average packs/day: 0.5 packs/day for 15.0 years (7.5 ttl pk-yrs)    Types: Cigarettes    Quit date: 01/19/2023    Years since quitting: 1.0   Smokeless tobacco: Never  Vaping Use   Vaping status: Former  Substance Use Topics   Alcohol use: No   Drug use: No   "

## 2024-02-07 LAB — COMPLETE METABOLIC PANEL WITHOUT GFR
AG Ratio: 1.2 (calc) (ref 1.0–2.5)
ALT: 9 U/L (ref 6–29)
AST: 13 U/L (ref 10–35)
Albumin: 3.6 g/dL (ref 3.6–5.1)
Alkaline phosphatase (APISO): 113 U/L (ref 37–153)
BUN/Creatinine Ratio: 8 (calc) (ref 6–22)
BUN: 6 mg/dL — ABNORMAL LOW (ref 7–25)
CO2: 31 mmol/L (ref 20–32)
Calcium: 9 mg/dL (ref 8.6–10.4)
Chloride: 100 mmol/L (ref 98–110)
Creat: 0.74 mg/dL (ref 0.50–1.03)
Globulin: 3 g/dL (ref 1.9–3.7)
Glucose, Bld: 80 mg/dL (ref 65–99)
Potassium: 4.1 mmol/L (ref 3.5–5.3)
Sodium: 142 mmol/L (ref 135–146)
Total Bilirubin: 0.2 mg/dL (ref 0.2–1.2)
Total Protein: 6.6 g/dL (ref 6.1–8.1)

## 2024-02-07 LAB — CBC WITH DIFFERENTIAL/PLATELET
Absolute Lymphocytes: 1817 {cells}/uL (ref 850–3900)
Absolute Monocytes: 310 {cells}/uL (ref 200–950)
Basophils Absolute: 37 {cells}/uL (ref 0–200)
Basophils Relative: 0.6 %
Eosinophils Absolute: 192 {cells}/uL (ref 15–500)
Eosinophils Relative: 3.1 %
HCT: 37.6 % (ref 35.9–46.0)
Hemoglobin: 11.8 g/dL (ref 11.7–15.5)
MCH: 27.3 pg (ref 27.0–33.0)
MCHC: 31.4 g/dL — ABNORMAL LOW (ref 31.6–35.4)
MCV: 86.8 fL (ref 81.4–101.7)
MPV: 9.3 fL (ref 7.5–12.5)
Monocytes Relative: 5 %
Neutro Abs: 3844 {cells}/uL (ref 1500–7800)
Neutrophils Relative %: 62 %
Platelets: 489 Thousand/uL — ABNORMAL HIGH (ref 140–400)
RBC: 4.33 Million/uL (ref 3.80–5.10)
RDW: 14 % (ref 11.0–15.0)
Total Lymphocyte: 29.3 %
WBC: 6.2 Thousand/uL (ref 3.8–10.8)

## 2024-02-07 LAB — ASPERGILLUS ANTIGEN,SERUM
Aspergillus Ag, EIA: NOT DETECTED
Index Value: 0.03

## 2024-02-07 LAB — FUNGITELL (1-3)-B-D-GLUCAN
(1-3)-B-D-Glucan: <31 pg/mL
Interpretation: NEGATIVE

## 2024-02-09 ENCOUNTER — Encounter (HOSPITAL_BASED_OUTPATIENT_CLINIC_OR_DEPARTMENT_OTHER)

## 2024-02-10 ENCOUNTER — Ambulatory Visit (HOSPITAL_BASED_OUTPATIENT_CLINIC_OR_DEPARTMENT_OTHER)
Admission: RE | Admit: 2024-02-10 | Discharge: 2024-02-10 | Disposition: A | Discharge status: Home / Self Care | Source: Ambulatory Visit | Attending: Internal Medicine | Admitting: Internal Medicine

## 2024-02-10 DIAGNOSIS — J189 Pneumonia, unspecified organism: Secondary | ICD-10-CM | POA: Diagnosis present

## 2024-02-10 MED ORDER — IOHEXOL 300 MG/ML  SOLN
100.0000 mL | Freq: Once | INTRAMUSCULAR | Status: AC | PRN
  Administered 2024-02-10: 75 mL via INTRAVENOUS

## 2024-02-13 ENCOUNTER — Encounter: Payer: Self-pay | Admitting: Student in an Organized Health Care Education/Training Program

## 2024-02-13 ENCOUNTER — Ambulatory Visit (INDEPENDENT_AMBULATORY_CARE_PROVIDER_SITE_OTHER): Admitting: Student in an Organized Health Care Education/Training Program

## 2024-02-13 ENCOUNTER — Ambulatory Visit (HOSPITAL_BASED_OUTPATIENT_CLINIC_OR_DEPARTMENT_OTHER)

## 2024-02-13 ENCOUNTER — Ambulatory Visit: Payer: Self-pay

## 2024-02-13 VITALS — BP 135/84 | HR 59 | Temp 98.4°F | Ht 64.0 in | Wt 134.0 lb

## 2024-02-13 DIAGNOSIS — R5381 Other malaise: Secondary | ICD-10-CM

## 2024-02-13 DIAGNOSIS — M79674 Pain in right toe(s): Secondary | ICD-10-CM | POA: Insufficient documentation

## 2024-02-13 DIAGNOSIS — M25572 Pain in left ankle and joints of left foot: Secondary | ICD-10-CM

## 2024-02-13 NOTE — Patient Instructions (Signed)
" °  VISIT SUMMARY: During your visit, we discussed your recent fall and the resulting injuries to your right pinky toe and left ankle. We also addressed your leg weakness and gait disturbance, as well as your ongoing peripheral neuropathy.  YOUR PLAN: -RIGHT PINKY TOE INJURY (CONTUSION VS. FRACTURE): You have an acute injury to your right pinky toe, which may be a contusion or fracture. We have ordered an x-ray to check for a fracture. In the meantime, wear comfortable shoes and take Tylenol  or ibuprofen for pain management.  -LEFT ANKLE INJURY (CONTUSION VS. FRACTURE): You have an acute injury to your left ankle, which may be a contusion or fracture. We have ordered an x-ray to check for a fracture. Rest and elevate your ankle, and take Tylenol  or ibuprofen for pain management.  -MUSCLE WEAKNESS AND GAIT DISTURBANCE DUE TO DECONDITIONING: Your chronic muscle weakness and gait disturbance are likely due to deconditioning. It is important to improve your strength and balance through physical therapy. Please discuss physical therapy with your primary care doctor and gradually increase your physical activity as tolerated.  -PERIPHERAL NEUROPATHY OF THE FEET: You have chronic peripheral neuropathy, which causes numbness and altered sensation in your feet. Continue taking gabapentin  to manage these symptoms.  INSTRUCTIONS: Please follow up with your primary care doctor to discuss physical therapy for your muscle weakness and gait disturbance. Additionally, ensure you get the x-rays for your right pinky toe and left ankle as ordered.   "

## 2024-02-13 NOTE — Assessment & Plan Note (Addendum)
 Chronic weakness and gait disturbance likely due to deconditioning. Physical therapy is important for improving strength and balance.  I recommended physical therapy and offered to order it for her, she declines for now and wants to discuss it with her primary provider.  She has some polypharmacy that may be contributing to imbalance as well.  I recommended she reconsider the combination of Valium , trazodone , and gabapentin  as they may be increasing her risk of falls.  She is open to changes, she is going to discuss this with her primary provider.

## 2024-02-13 NOTE — Assessment & Plan Note (Signed)
 Acute injury with point tenderness suggests a contusion or fracture.  Positive [place] ankle rule with the point tenderness in the lateral malleolus.  Ordered an x-ray of the left ankle to assess for fracture. Advised supportive care with rest and elevation and recommended Tylenol  or ibuprofen for pain management.

## 2024-02-13 NOTE — Telephone Encounter (Signed)
 FYI Only or Action Required?: Action required by provider: request for appointment.  Patient was last seen in primary care on 01/23/2024 by [name], Sabrina PARAS, MD.  Called Nurse Triage reporting Fall.  Symptoms began yesterday.  Interventions attempted: Nothing.  Symptoms are: unchanged.Fell at home yesterday. Legs went numb and folded under her. Injured both feet, has bruising, right is worse. This has been happening. Concerned.  Triage Disposition: No disposition on file.  Patient/caregiver understands and will follow disposition?:      Reason for Triage: Patient stood up, legs was numb went to take a step and legs folded under her , bend her toe and one is black and swollen , fell out on the hardwood floor patient is also dealing with a horrible cold    Answer Assessment - Initial Assessment Questions 1. MECHANISM: How did the fall happen?     Legs fell asleeep 2. DOMESTIC VIOLENCE AND ELDER ABUSE SCREENING: Did you fall because someone pushed you or tried to hurt you? If Yes, ask: Are you safe now?     no 3. ONSET: When did the fall happen? (e.g., minutes, hours, or days ago)     yesterday 4. LOCATION: What part of the body hit the ground? (e.g., back, buttocks, head, hips, knees, hands, head, stomach)     Legs, bottom 5. INJURY: Did you hurt (injure) yourself when you fell? If Yes, ask: What did you injure? Tell me more about this? (e.g., body area; type of injury; pain severity)     yes 6. PAIN: Is there any pain? If Yes, ask: How bad is the pain? (e.g., Scale 0-10; or none, mild,      5 with walking 7. SIZE: For cuts, bruises, or swelling, ask: How large is it? (e.g., inches or centimeters)      N/a 8. PREGNANCY: Is there any chance you are pregnant? When was your last menstrual period?     no 9. OTHER SYMPTOMS: Do you have any other symptoms? (e.g., dizziness, fever, weakness; new-onset or worsening).      Tingling, numbness to legs 10.  CAUSE: What do you think caused the fall (or falling)? (e.g., dizzy spell, tripped)       Legs tremors  Protocols used: [facility]

## 2024-02-13 NOTE — Progress Notes (Signed)
 "  Acute Office Visit  Patient ID: Sabrina Roberson, female    DOB: [birth year], 54 y.o.   MRN: [id number]  PCP: Knute Thersia Bitters, FNP  Chief Complaint  Patient presents with   Fall    Fall happened yesterday afternoon. Both legs get wobbly when she is walking. Falling is new, but the legs has been going on and has got worst.     Subjective:     HPI  Discussed the use of AI scribe software for clinical note transcription with the patient, who gave verbal consent to proceed.  History of Present Illness Sabrina Roberson is a 54 year old female with diabetes who presents with a fall and leg weakness.  She experienced a fall yesterday due to leg weakness, describing her legs as feeling like 'spaghetti' and 'trying to walk through an ocean.' Her legs feel weak and wobbly, requiring her to stop frequently. During the fall, she injured her right pinky toe, which is now black, swollen, and painful, and her left ankle, which is also painful. She describes numbness in three toes and some areas of her foot and ankle.  She has a history of lung trouble following a hospitalization in December 2024 for flu that progressed to sepsis, requiring ventilator support for 18 days. She has not fully recovered from this episode, experiencing fatigue and breathlessness, which limits her physical activity. She has not engaged in physical therapy post-hospitalization.  She has diabetes, which she states is under control without medication, with a recent A1c of 5.4%. She uses gabapentin  for nerve damage in her feet, which helps with sensation issues. She also takes Valium , 2.5 mg daily, and has been on it for a long time. She is currently out of trazodone , which she used at bedtime.  She reports experiencing cold chills frequently but denies having a fever. She has been sick recently, which has affected her voice.      Objective:    BP 135/84   Pulse (!) 59   Temp 98.4 F (36.9 C) (Oral)   Ht 5' 4 (1.626  m)   Wt 134 lb (60.8 kg)   SpO2 95%   BMI 23.00 kg/m   Physical Exam  Gen: Well-appearing woman Neuro: Alert, conversational, a little sedated appearing, moderately slow get up and go, antalgic gait, full strength upper and lower extremities, moderate decrease sensation in both feet to monofilament Feet: On the right fifth toe there is ecchymosis and there is associated swelling along the dorsal foot.  There is tenderness to palpation of the fifth toe and limited range of motion because of pain.  On the left foot, there is no ecchymosis, but there is point tenderness over the posterior aspect of the lateral malleolus.     Assessment & Plan:   Problem List Items Addressed This Visit       Unprioritized   Physical deconditioning   Chronic weakness and gait disturbance likely due to deconditioning. Physical therapy is important for improving strength and balance.  I recommended physical therapy and offered to order it for her, she declines for now and wants to discuss it with her primary provider.  She has some polypharmacy that may be contributing to imbalance as well.  I recommended she reconsider the combination of Valium , trazodone , and gabapentin  as they may be increasing her risk of falls.  She is open to changes, she is going to discuss this with her primary provider.      Pain in right  toe(s) - Primary   Acute injury with swelling and bruising suggests a contusion or fracture. Ordered an x-ray of the right foot to assess for fracture. Advised supportive care with comfortable shoes and recommended Tylenol  or ibuprofen for pain management.      Relevant Orders   DG Foot Complete Right   Pain in left ankle   Acute injury with point tenderness suggests a contusion or fracture.  Positive [place] ankle rule with the point tenderness in the lateral malleolus.  Ordered an x-ray of the left ankle to assess for fracture. Advised supportive care with rest and elevation and recommended Tylenol   or ibuprofen for pain management.      Relevant Orders   DG Ankle Complete Left    Cleatus Debby Specking, MD [facility]   "

## 2024-02-13 NOTE — Assessment & Plan Note (Signed)
 Acute injury with swelling and bruising suggests a contusion or fracture. Ordered an x-ray of the right foot to assess for fracture. Advised supportive care with comfortable shoes and recommended Tylenol  or ibuprofen for pain management.

## 2024-02-18 ENCOUNTER — Other Ambulatory Visit (HOSPITAL_BASED_OUTPATIENT_CLINIC_OR_DEPARTMENT_OTHER): Payer: Self-pay | Admitting: Family Medicine

## 2024-02-18 ENCOUNTER — Encounter: Payer: Self-pay | Admitting: Pulmonary Disease

## 2024-02-18 ENCOUNTER — Encounter: Payer: Self-pay | Admitting: Student in an Organized Health Care Education/Training Program

## 2024-02-18 ENCOUNTER — Ambulatory Visit: Admitting: Pulmonary Disease

## 2024-02-18 VITALS — BP 102/56 | HR 82 | Ht 64.0 in | Wt 134.2 lb

## 2024-02-18 DIAGNOSIS — J189 Pneumonia, unspecified organism: Secondary | ICD-10-CM | POA: Diagnosis not present

## 2024-02-18 DIAGNOSIS — Z87891 Personal history of nicotine dependence: Secondary | ICD-10-CM

## 2024-02-18 DIAGNOSIS — J9611 Chronic respiratory failure with hypoxia: Secondary | ICD-10-CM | POA: Diagnosis not present

## 2024-02-18 DIAGNOSIS — J471 Bronchiectasis with (acute) exacerbation: Secondary | ICD-10-CM

## 2024-02-18 NOTE — Assessment & Plan Note (Addendum)
" °  Orders:   IgG, IgA, IgM; Future   IgE; Future  "

## 2024-02-18 NOTE — Patient Instructions (Signed)
 You have a collapsing airway in the right upper lung that is leading to recurrent pneumonia  Continue bevespi  inhaler 2 puffs twice daily  Airway Clearance: - Use nebulizer treatments twice daily followed by flutter valve to help clear out the mucous in your lungs  We will work on getting you a chest vest for airway clearance  Use albuterol  inhaler 1-2 puffs every 4-6 hours as needed  I have referred you to Dr. Marcos at [facility] to undergo testing for Cystic Fibrosis  Follow up in 3 months

## 2024-02-18 NOTE — Assessment & Plan Note (Addendum)
  Orders:   Ambulatory referral to Pulmonology

## 2024-02-18 NOTE — Telephone Encounter (Signed)
Patient responded.

## 2024-02-18 NOTE — Progress Notes (Unsigned)
 "  Established Patient Pulmonology Office Visit   Subjective:  Patient ID: Sabrina Roberson, female    DOB: [birth year]  MRN: [id number]  CC:  Chief Complaint  Patient presents with   Medical Management of Chronic Issues    Pt states battling cold , OTC , green sputum     Discussed the use of AI scribe software for clinical note transcription with the patient, who gave verbal consent to proceed.  History of Present Illness Sabrina Roberson is a 54 year old woman, former smoker with DMII, GERD, depression/anxiety, hypertension, CKD and asthma who returns to pulmonary clinic for recurrent pneumonias and bronchiectasis.   She reports worsening respiratory symptoms with a recent cold, green sputum, and significant voice loss. Her cough alternates between dry and productive. She uses a hypertonic saline nebulizer, flutter valve, and Breztri inhaler.  She has recurrent pneumonias and infections over the past year. She had bronchoscopy 11/04/23 which showed airway collapse of the right upper lung posterior segmental bronchus with cultures growing achromobacter. She completed 14 day course of levaquin .   She has been referred to immunology for evaluation of possible immunodeficiency.  She has episodes of exertional hypoxemia, with oxygen saturation dropping to 85 percent while walking. She does not have home oxygen. She notes chills without fever and reports marked weakness. She recently fell and now has black and blue toes with numbness in three toes.  She reports fluctuating oxygen levels and persistent extreme weakness. Her toes were x-rayed after the fall and results are pending.  She denies recurrent infections in childhood beyond typical self-limited colds. She remains on nebulizers and flutter valve and is awaiting approval for a chest vest for airway clearance.        ROS   Current Medications[1]      Objective:  BP (!) 102/56   Pulse 82   Ht 5' 4 (1.626 m)   Wt 134 lb 3.2 oz  (60.9 kg)   SpO2 95%   BMI 23.04 kg/m     Physical Exam Constitutional:      General: She is not in acute distress.    Appearance: Normal appearance.  Eyes:     General: No scleral icterus.    Conjunctiva/sclera: Conjunctivae normal.  Cardiovascular:     Rate and Rhythm: Normal rate and regular rhythm.  Pulmonary:     Breath sounds: Rhonchi (right upper lung field) present. No wheezing or rales.  Musculoskeletal:     Right lower leg: No edema.     Left lower leg: No edema.  Skin:    General: Skin is warm and dry.  Neurological:     General: No focal deficit present.    SATURATION QUALIFICATIONS: (This note is used to comply with regulatory documentation for home oxygen)   Patient Saturations on Room Air at Rest = 92%   Patient Saturations on Room Air while Ambulating = 85%   Patient Saturations on 2 Liters of oxygen while Ambulating = 96%   Please briefly explain why patient needs home oxygen:Patient could not do laps without O2 dropping to 85 % place on continous O2 2L sats @ 96%  02/18/24   SATURATION QUALIFICATIONS: (This note is used to comply with regulatory documentation for home oxygen)   Patient Saturations on Room Air at Rest = 96%   Patient Saturations on Room Air while Ambulating = 84%   Patient Saturations on 2 Liters of oxygen while Ambulating = 94%   Please briefly explain why patient needs  home oxygen:Patient could not do laps without O2 dropping to 84 % place on POC O2 2L sats @ 94% 02/18/24  Diagnostic Review:  Last CBC Lab Results  Component Value Date   WBC 6.2 02/04/2024   HGB 11.8 02/04/2024   HCT 37.6 02/04/2024   MCV 86.8 02/04/2024   MCH 27.3 02/04/2024   RDW 14.0 02/04/2024   PLT 489 (H) 02/04/2024   Last metabolic panel Lab Results  Component Value Date   GLUCOSE 80 02/04/2024   NA 142 02/04/2024   K 4.1 02/04/2024   CL 100 02/04/2024   CO2 31 02/04/2024   BUN 6 (L) 02/04/2024   CREATININE 0.74 02/04/2024   EGFR 68  12/19/2023   CALCIUM  9.0 02/04/2024   PHOS 3.1 11/05/2023   PROT 6.6 02/04/2024   ALBUMIN 4.0 12/19/2023   LABGLOB 2.1 12/19/2023   BILITOT 0.2 02/04/2024   ALKPHOS 82 12/19/2023   AST 13 02/04/2024   ALT 9 02/04/2024   ANIONGAP 6 11/05/2023       Assessment & Plan:   Assessment & Plan Bronchiectasis with acute exacerbation (HCC)  Orders:   Ambulatory referral to Pulmonology  Recurrent pneumonia  Orders:   IgG, IgA, IgM; Future   IgE; Future   Assessment and Plan Assessment & Plan Bronchiectasis with acute exacerbation Chronic bronchiectasis exacerbated by green sputum, wheezing, and hypoxemia. CT shows persistent right upper and middle lobe involvement. Previous antibiotics ineffective. Cystic fibrosis considered. Immune evaluation pending. - Ordered immunoglobulin markers. - Referred to Dr. Marcos for cystic fibrosis evaluation at [facility] - Referred to Immunology - Continue hypertonic saline nebulizer and flutter valve. - Ensure Breztri inhaler adherence. - Attempt to obtain chest vest for airway clearance. - Monitor oxygen saturation during ambulation; order home oxygen if saturation <88%.  Recurrent pneumonia Persistent right upper and middle lobe involvement. Immune evaluation needed to assess for immunodeficiency. Surgical intervention considered if medical management fails and immune system is normal. - Evaluate immune function with immunoglobulin markers. - Refer to immunology for further evaluation. - Consider surgical consultation for lung resection if immune system is normal and medical management fails.  Chronic Hypoxemic Respiratory Failure - order home oxygen and supplies - Order POC - recommend 2L POC when ambulating or 2L continuous when ambulating at home - will consider repeat echocardiogram with bubble study and possible RHC to evaluate for pulmonary hypertension given her new oxygen requirement.    Return in about 3 months (around  05/18/2024) for f/u visit Dr. Kara.   Dorn KATHEE Kara, MD     [1]  Current Outpatient Medications:    albuterol  (ACCUNEB ) 1.25 MG/3ML nebulizer solution, Take 1 ampule by nebulization every 6 (six) hours as needed for wheezing., Disp: , Rfl:    albuterol  (VENTOLIN  HFA) 108 (90 Base) MCG/ACT inhaler, TAKE 2 PUFFS BY MOUTH EVERY 6 HOURS AS NEEDED FOR WHEEZE OR SHORTNESS OF BREATH, Disp: 8.5 each, Rfl: 2   atorvastatin  (LIPITOR) 20 MG tablet, TAKE 1 TABLET BY MOUTH EVERYDAY AT BEDTIME, Disp: 90 tablet, Rfl: 3   diazepam  (VALIUM ) 5 MG tablet, Take 0.5 tablets (2.5 mg total) by mouth daily as needed for anxiety. Do not take more than 2.5 mg (half tablet) per day., Disp: 15 tablet, Rfl: 2   furosemide  (LASIX ) 20 MG tablet, Take 1 tablet (20 mg total) by mouth daily for 5 days., Disp: 30 tablet, Rfl: 3   Glycopyrrolate-Formoterol (BEVESPI  AEROSPHERE) 9-4.8 MCG/ACT AERO, Inhale 2 puffs into the lungs 2 (two) times daily.,  Disp: 10.7 g, Rfl: 5   ibuprofen (ADVIL) 200 MG tablet, Take 800 mg by mouth every 6 (six) hours as needed for mild pain (pain score 1-3) or moderate pain (pain score 4-6)., Disp: , Rfl:    levothyroxine  (SYNTHROID ) 88 MCG tablet, Take 1 tablet (88 mcg total) by mouth daily at 6 (six) AM., Disp: 90 tablet, Rfl: 2   nystatin cream (MYCOSTATIN), Apply topically., Disp: , Rfl:    promethazine  (PHENERGAN ) 25 MG tablet, TAKE 1 TABLET BY MOUTH EVERY 8 HOURS AS NEEDED FOR NAUSEA OR VOMITING., Disp: 30 tablet, Rfl: 3   propranolol  (INDERAL ) 80 MG tablet, TAKE 1 TABLET BY MOUTH EVERY DAY, Disp: 90 tablet, Rfl: 3   sodium chloride  HYPERTONIC 3 % nebulizer solution, Take 4 mLs by nebulization 2 (two) times daily., Disp: , Rfl:    traZODone  (DESYREL ) 50 MG tablet, Take 0.5 tablets (25 mg total) by mouth at bedtime as needed for sleep., Disp: 30 tablet, Rfl: 1   gabapentin  (NEURONTIN ) 600 MG tablet, TAKE 1 TABLET BY MOUTH AT BEDTIME., Disp: 60 tablet, Rfl: 3  "

## 2024-02-19 ENCOUNTER — Ambulatory Visit: Payer: Self-pay | Admitting: Student in an Organized Health Care Education/Training Program

## 2024-02-19 ENCOUNTER — Telehealth: Payer: Self-pay

## 2024-02-19 ENCOUNTER — Encounter: Payer: Self-pay | Admitting: Pulmonary Disease

## 2024-02-19 DIAGNOSIS — J9611 Chronic respiratory failure with hypoxia: Secondary | ICD-10-CM

## 2024-02-19 DIAGNOSIS — S92501A Displaced unspecified fracture of right lesser toe(s), initial encounter for closed fracture: Secondary | ICD-10-CM

## 2024-02-19 DIAGNOSIS — S82832A Other fracture of upper and lower end of left fibula, initial encounter for closed fracture: Secondary | ICD-10-CM

## 2024-02-19 LAB — IGG, IGA, IGM
IgG (Immunoglobin G), Serum: 1204 mg/dL (ref 600–1640)
IgM, Serum: 67 mg/dL (ref 50–300)
Immunoglobulin A: 240 mg/dL (ref 47–310)

## 2024-02-19 LAB — IGE: IgE (Immunoglobulin E), Serum: 125 kU/L — ABNORMAL HIGH

## 2024-02-19 NOTE — Telephone Encounter (Signed)
 [facility]iology called in at 8:27 am with X-ray result Rt Foot X-ray shows a minimally displaced fracture through the base of the fifth proximal phalanx with intra-articular extension, a nondisplaced fracture of the distal fibula and age indeterminate fractures of the heads of the second through fourth metatarsals. Recommend correlation with point tenderness.  Report is in the chart

## 2024-02-19 NOTE — Telephone Encounter (Signed)
 Copied from CRM #[id number]. Topic: Clinical - Order For Equipment >> Feb 19, 2024  3:33 PM Russell PARAS wrote: Reason for CRM:   Pt is contacting clinic regarding her visit w/D[facility] on 1/21. She was advised that they would be placing an order for oxygen; however, she has not heard from the DME company. Reviewed chart and was unable to find DME company or order for oxygen.  Requested call back to be provided DME contact info  CB#  [phone], please leave detailed messge with requested information if she does not answer      No oxygen order placed at [facility]. However Per [facility] notes 02/18/24:  Chronic Hypoxemic Respiratory Failure - order home oxygen and supplies - Order POC - recommend 2L POC when ambulating or 2L continuous when ambulating at home  Oxygen order sent. Pt notified, NFN.

## 2024-02-20 ENCOUNTER — Telehealth: Payer: Self-pay

## 2024-02-20 ENCOUNTER — Telehealth: Payer: Self-pay | Admitting: Pulmonary Disease

## 2024-02-20 ENCOUNTER — Other Ambulatory Visit: Payer: Self-pay

## 2024-02-20 DIAGNOSIS — J9611 Chronic respiratory failure with hypoxia: Secondary | ICD-10-CM

## 2024-02-20 NOTE — Telephone Encounter (Signed)
 Dis regard this order already spoke to [employer] they said  O2 was already pending deliver

## 2024-02-20 NOTE — Telephone Encounter (Signed)
 Copied from CRM #[id number]. Topic: Clinical - Order For Equipment >> Feb 20, 2024 10:34 AM Rilla NOVAK wrote: Reason for CRM: Patient's husband calling regarding oxygen order for wife. States he is checking the status and trying to get ahead of the storm. States he spoke with [employer], [place] and they did not have it. Please call patient @ [phone] >> Feb 20, 2024 10:51 AM Benton KIDD wrote: dr dewald sent out prescription throough [facility] and they say they have not received anything and she gave me a fax number to make sure it was sent to right company . Gave patient husband the information from referral . She gave permission over the phone to speak with him as well  Fax number       Spoke with [employer] it is pending delivery  [employer] will be reaching out 30 mins before they deliver it

## 2024-02-20 NOTE — Telephone Encounter (Signed)
 [employer] [facility] is requesting the following for this patient's DME order:  Patient has a medicare plan so we will needs sats cosigned or added to the md note

## 2024-02-20 NOTE — Telephone Encounter (Signed)
 Please sign urgent O2 order

## 2024-02-20 NOTE — Telephone Encounter (Signed)
 Copied from CRM #[id number]. Topic: Clinical - Order For Equipment >> Feb 20, 2024 11:11 AM Joesph PARAS wrote: Reason for CRM: Patient is requesting that we send DME order specifically to fax number: [phone]. Patient would like this done before office closes and would like it marked as urgent. Please update patient when order has been faxed.    Spoke with [employer] it is pending delivery  [employer] will be reaching out 30 mins before they deliver it

## 2024-02-23 NOTE — Telephone Encounter (Signed)
 Handled per other note.

## 2024-02-24 ENCOUNTER — Telehealth: Payer: Self-pay

## 2024-02-24 ENCOUNTER — Ambulatory Visit: Payer: Self-pay | Admitting: Pulmonary Disease

## 2024-02-24 ENCOUNTER — Telehealth: Payer: Self-pay | Admitting: Pulmonary Disease

## 2024-02-24 ENCOUNTER — Other Ambulatory Visit: Payer: Self-pay

## 2024-02-24 DIAGNOSIS — J189 Pneumonia, unspecified organism: Secondary | ICD-10-CM

## 2024-02-24 DIAGNOSIS — J9611 Chronic respiratory failure with hypoxia: Secondary | ICD-10-CM

## 2024-02-24 NOTE — Telephone Encounter (Addendum)
 I have sent the new referral but pt canceled the first appt and was a no show to the second appt on 12/4 so they closed the other referral. I have sent the second referral.

## 2024-02-24 NOTE — Telephone Encounter (Signed)
 Copied from CRM #[id number]. Topic: Clinical - Order For Equipment >> Feb 23, 2024 12:06 PM Dedra B wrote: Reason for CRM: Caera from [facility] received a prescription for patient's oxygen concentrator and supplies, but it didn't have a signature. They need a prescription with signature and date faxed to [phone].   Signed order faxed

## 2024-02-24 NOTE — Telephone Encounter (Signed)
 Can you please double check on the referral to the Immunology clinic for a consult evaluation. I spoke with patient to give their office a call about scheduling appointment, I don't see that she has been scheduled.  Thanks, JD

## 2024-02-24 NOTE — Telephone Encounter (Signed)
 New order has been sent.

## 2024-03-03 ENCOUNTER — Ambulatory Visit: Admitting: Internal Medicine

## 2024-03-03 ENCOUNTER — Other Ambulatory Visit: Payer: Self-pay

## 2024-03-03 ENCOUNTER — Encounter: Payer: Self-pay | Admitting: Internal Medicine

## 2024-03-03 NOTE — Progress Notes (Unsigned)
 "     Patient: Sabrina Roberson  DOB: 08-Jul-1970 MRN: [id number] PCP: Knute Thersia Bitters, FNP  Referring Provider: ***  Chief Complaint  Patient presents with   Follow-up     Patient Active Problem List   Diagnosis Date Noted   Pain in right toe(s) 02/13/2024   Pain in left ankle 02/13/2024   Drug rash 01/23/2024   Acute hypercapnic respiratory failure (HCC) 11/04/2023   Right middle lobe syndrome 10/26/2023   Chronic cough 09/02/2023   Dysdiadochokinesia 09/02/2023   Pneumonia 09/02/2023   Severe episode of recurrent major depressive disorder, with psychotic features (HCC) 07/08/2023   Lymphadenopathy, axillary 07/08/2023   Recurrent pneumonia 06/27/2023   Bronchiectasis without complication (HCC) 06/10/2023   Pneumonia due to methicillin resistant Staphylococcus aureus (MRSA) (HCC) 04/03/2023   Physical deconditioning 02/17/2023   Bilateral lower extremity edema 12/03/2022   Bilateral carotid bruits 11/04/2022   CKD (chronic kidney disease) stage 2, GFR 60-89 ml/min 11/04/2022   Mixed hyperlipidemia 10/10/2022   GAD (generalized anxiety disorder) 10/10/2022   Acquired hypothyroidism 10/10/2022   Hypertension    Type 2 diabetes mellitus with diabetic chronic kidney disease (HCC)    Primary insomnia 09/18/2022   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 @GENDER @ with   ROS  Past Medical History:  Diagnosis Date   Abdominal pain 02/11/2011   Abnormal CT of the chest 02/04/2014   CT chest 08/2013:  atx in RML and lingula  CT angio 12/2013:  No PE, RML scarring, abnormal density in lingula  (done at [facility])     Allergy    AMS (altered mental status) 09/03/2023   Anxiety    Arthritis    Asthma    Bruises easily    Chronic chest wall pain    Chronic pain    Cough    Depression    Diabetes mellitus without complication (HCC)    Dyspnea    chronic   Dysuria 09/18/2022   Encounter for therapeutic drug monitoring 02/11/2011   Fibromyalgia    Functional movement disorder    GERD (gastroesophageal reflux disease)    Headache    Hearing loss    High cholesterol    History of hiatal hernia    Hypertension    Hypothyroidism    Kidney disease    stage 3   Leg swelling    both   Localized swelling of both lower legs 05/29/2023   Nausea 02/11/2011   Nausea and vomiting 04/01/2013   Pain management    Paranoid behavior (HCC) 07/08/2023   Sebaceous cyst of breast, right subaereolar 10/30/2010   Septic shock (HCC) 01/19/2023   SMAS (superior mesenteric artery syndrome)    Thyroid  disease    TIA (transient ischemic attack)    Tic disorder    TMJ (dislocation of temporomandibular joint)    Transient cerebral ischemia    Type 2 diabetes mellitus with complications (HCC)     Outpatient Medications Prior to Visit  Medication Sig Dispense Refill   albuterol  (ACCUNEB ) 1.25 MG/3ML nebulizer solution Take 1 ampule by nebulization every 6 (six) hours as needed for wheezing.     albuterol  (VENTOLIN  HFA) 108 (90 Base)  MCG/ACT inhaler TAKE 2 PUFFS BY MOUTH EVERY 6 HOURS AS NEEDED FOR WHEEZE OR SHORTNESS OF BREATH 8.5 each 2   atorvastatin  (LIPITOR) 20 MG tablet TAKE 1 TABLET BY MOUTH EVERYDAY AT BEDTIME 90 tablet 3   diazepam  (VALIUM ) 5 MG tablet Take 0.5 tablets (2.5 mg total) by mouth daily as needed for anxiety. Do not take more than 2.5 mg (half tablet) per day. 15 tablet 2   furosemide  (LASIX ) 20 MG tablet Take 1 tablet (20 mg total) by mouth daily for 5 days. 30 tablet 3   gabapentin  (NEURONTIN ) 600 MG tablet TAKE 1 TABLET BY MOUTH AT  BEDTIME. 60 tablet 3   Glycopyrrolate-Formoterol (BEVESPI  AEROSPHERE) 9-4.8 MCG/ACT AERO Inhale 2 puffs into the lungs 2 (two) times daily. 10.7 g 5   ibuprofen (ADVIL) 200 MG tablet Take 800 mg by mouth every 6 (six) hours as needed for mild pain (pain score 1-3) or moderate pain (pain score 4-6).     levothyroxine  (SYNTHROID ) 88 MCG tablet Take 1 tablet (88 mcg total) by mouth daily at 6 (six) AM. 90 tablet 2   nystatin cream (MYCOSTATIN) Apply topically.     promethazine  (PHENERGAN ) 25 MG tablet TAKE 1 TABLET BY MOUTH EVERY 8 HOURS AS NEEDED FOR NAUSEA OR VOMITING. 30 tablet 3   propranolol  (INDERAL ) 80 MG tablet TAKE 1 TABLET BY MOUTH EVERY DAY 90 tablet 3   sodium chloride  HYPERTONIC 3 % nebulizer solution Take 4 mLs by nebulization 2 (two) times daily.     traZODone  (DESYREL ) 50 MG tablet Take 0.5 tablets (25 mg total) by mouth at bedtime as needed for sleep. 30 tablet 1   No facility-administered medications prior to visit.     Allergies[1]  Social History[2]  Family History  Problem Relation Age of Onset   Asthma Father    Breast cancer Sister    Hypertension Sister    Parkinson's disease Maternal Aunt    Pulmonary fibrosis Maternal Grandmother    Emphysema Maternal Grandmother    Heart disease Maternal Grandmother    Breast cancer Maternal Grandmother    Asthma Maternal Grandfather    Heart disease Maternal Grandfather    Cancer Paternal Grandmother        breast   Heart disease Paternal Grandmother    Breast cancer Paternal Grandmother    Asthma Paternal Grandmother    Leukemia Paternal Grandfather    Heart disease Paternal Grandfather    Clotting disorder Paternal Grandfather     Objective:   Vitals:   03/03/24 1406  BP: (!) 164/83  Pulse: 68  Temp: 98.8 F (37.1 C)  TempSrc: Oral  SpO2: 90%   There is no height or weight on file to calculate BMI.  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 Pt on ra -pt going to sytic clini c. [facility] pul .  Loney Stank, MD [facility]   03/03/24  2:16 PM       [1] Allergies Allergen Reactions   Latex Anaphylaxis and Swelling   Tape Itching and Rash    Please use paper tape only. Will pull off skin if not.   Itraconazole  Nausea And Vomiting and Rash   Codeine Nausea And Vomiting    GI Upset (intolerance), Vomiting (intolerance)   Oxycodone  Nausea And Vomiting    Pre-medication with phenergan  needed.  [2] Social History Tobacco Use   Smoking status: Former    Current packs/day: 0.00    Average packs/day: 0.5 packs/day for 15.0 years (7.5 ttl pk-yrs)    Types: Cigarettes    Quit date: 01/19/2023    Years since quitting: 1.1   Smokeless tobacco: Never  Vaping Use   Vaping status: Former  Substance Use Topics   Alcohol use: No   Drug use: No  "

## 2024-03-04 ENCOUNTER — Ambulatory Visit: Payer: Self-pay | Admitting: Internal Medicine

## 2024-03-31 ENCOUNTER — Ambulatory Visit (HOSPITAL_BASED_OUTPATIENT_CLINIC_OR_DEPARTMENT_OTHER): Admitting: Cardiology

## 2024-05-07 ENCOUNTER — Ambulatory Visit: Admitting: Pulmonary Disease
